# Patient Record
Sex: Female | Born: 1945
Health system: Southern US, Community
[De-identification: ages and names within clinical notes are randomized; demographics above are authoritative.]

## PROBLEM LIST (undated history)

## (undated) DIAGNOSIS — R809 Proteinuria, unspecified: Secondary | ICD-10-CM

## (undated) DIAGNOSIS — G4730 Sleep apnea, unspecified: Secondary | ICD-10-CM

## (undated) DIAGNOSIS — H269 Unspecified cataract: Secondary | ICD-10-CM

## (undated) DIAGNOSIS — Z87898 Personal history of other specified conditions: Secondary | ICD-10-CM

## (undated) DIAGNOSIS — S329XXA Fracture of unspecified parts of lumbosacral spine and pelvis, initial encounter for closed fracture: Secondary | ICD-10-CM

## (undated) DIAGNOSIS — E162 Hypoglycemia, unspecified: Secondary | ICD-10-CM

## (undated) DIAGNOSIS — T8859XA Other complications of anesthesia, initial encounter: Secondary | ICD-10-CM

## (undated) DIAGNOSIS — Z720 Tobacco use: Secondary | ICD-10-CM

## (undated) DIAGNOSIS — M1990 Unspecified osteoarthritis, unspecified site: Secondary | ICD-10-CM

## (undated) DIAGNOSIS — E669 Obesity, unspecified: Secondary | ICD-10-CM

## (undated) DIAGNOSIS — M2550 Pain in unspecified joint: Secondary | ICD-10-CM

## (undated) DIAGNOSIS — R002 Palpitations: Secondary | ICD-10-CM

## (undated) DIAGNOSIS — K219 Gastro-esophageal reflux disease without esophagitis: Secondary | ICD-10-CM

## (undated) DIAGNOSIS — N289 Disorder of kidney and ureter, unspecified: Secondary | ICD-10-CM

## (undated) DIAGNOSIS — F419 Anxiety disorder, unspecified: Secondary | ICD-10-CM

## (undated) DIAGNOSIS — G4700 Insomnia, unspecified: Secondary | ICD-10-CM

## (undated) DIAGNOSIS — F32A Depression, unspecified: Secondary | ICD-10-CM

## (undated) DIAGNOSIS — T7840XA Allergy, unspecified, initial encounter: Secondary | ICD-10-CM

## (undated) DIAGNOSIS — E785 Hyperlipidemia, unspecified: Secondary | ICD-10-CM

## (undated) DIAGNOSIS — T4145XA Adverse effect of unspecified anesthetic, initial encounter: Secondary | ICD-10-CM

## (undated) HISTORY — DX: Depression, unspecified: F32.A

## (undated) HISTORY — PX: SHOULDER SURGERY: SHX246

## (undated) HISTORY — DX: Unspecified cataract: H26.9

## (undated) HISTORY — PX: OTHER SURGICAL HISTORY: SHX169

## (undated) HISTORY — DX: Gastro-esophageal reflux disease without esophagitis: K21.9

## (undated) HISTORY — DX: Tobacco use: Z72.0

## (undated) HISTORY — DX: Obesity, unspecified: E66.9

## (undated) HISTORY — DX: Unspecified osteoarthritis, unspecified site: M19.90

## (undated) HISTORY — DX: Allergy, unspecified, initial encounter: T78.40XA

## (undated) HISTORY — DX: Sleep apnea, unspecified: G47.30

## (undated) HISTORY — DX: Insomnia, unspecified: G47.00

## (undated) HISTORY — DX: Hyperlipidemia, unspecified: E78.5

## (undated) HISTORY — PX: ABDOMINAL HYSTERECTOMY: SHX81

## (undated) HISTORY — DX: Personal history of other specified conditions: Z87.898

## (undated) HISTORY — DX: Pain in unspecified joint: M25.50

## (undated) HISTORY — DX: Fracture of unspecified parts of lumbosacral spine and pelvis, initial encounter for closed fracture: S32.9XXA

## (undated) HISTORY — DX: Disorder of kidney and ureter, unspecified: N28.9

## (undated) HISTORY — PX: FRACTURE SURGERY: SHX138

## (undated) HISTORY — DX: Proteinuria, unspecified: R80.9

## (undated) HISTORY — DX: Palpitations: R00.2

## (undated) HISTORY — PX: TUBAL LIGATION: SHX77

## (undated) HISTORY — PX: APPENDECTOMY: SHX54

## (undated) NOTE — *Deleted (*Deleted)
Health Maintenance Due  Topic Date Due  . COLONOSCOPY  05/11/2020  . INFLUENZA VACCINE  07/04/2020  . MAMMOGRAM  08/24/2020   Depression screen [facility] 2/9 07/31/2020 12/09/2019 02/14/2019  Decreased Interest 0 0 0  Down, Depressed, Hopeless 0 0 0  PHQ - 2 Score 0 0 0  Altered sleeping 1 0 0  Tired, decreased energy 0 0 0  Change in appetite 0 0 0  Feeling bad or failure about yourself  0 0 0  Trouble concentrating 0 0 0  Moving slowly or fidgety/restless 0 0 0  Suicidal thoughts 0 0 0  PHQ-9 Score 1 0 0  Difficult doing work/chores Not difficult at all Not difficult at all -

---

## 1990-12-04 HISTORY — PX: NEPHRECTOMY: SHX65

## 1999-12-05 HISTORY — PX: CHOLECYSTECTOMY: SHX55

## 2000-03-29 ENCOUNTER — Other Ambulatory Visit: Admission: RE | Admit: 2000-03-29 | Discharge: 2000-03-29 | Payer: Self-pay | Admitting: Plastic Surgery

## 2000-12-03 ENCOUNTER — Encounter: Payer: Self-pay | Admitting: *Deleted

## 2000-12-03 ENCOUNTER — Encounter: Admission: RE | Admit: 2000-12-03 | Discharge: 2000-12-03 | Payer: Self-pay | Admitting: *Deleted

## 2000-12-14 ENCOUNTER — Encounter: Payer: Self-pay | Admitting: Family Medicine

## 2000-12-14 ENCOUNTER — Encounter: Admission: RE | Admit: 2000-12-14 | Discharge: 2000-12-14 | Payer: Self-pay | Admitting: Family Medicine

## 2001-06-24 ENCOUNTER — Encounter: Payer: Self-pay | Admitting: Internal Medicine

## 2001-07-08 ENCOUNTER — Encounter (INDEPENDENT_AMBULATORY_CARE_PROVIDER_SITE_OTHER): Payer: Self-pay | Admitting: Specialist

## 2001-07-08 ENCOUNTER — Other Ambulatory Visit: Admission: RE | Admit: 2001-07-08 | Discharge: 2001-07-08 | Payer: Self-pay | Admitting: Surgery

## 2002-09-12 ENCOUNTER — Encounter: Payer: Self-pay | Admitting: Internal Medicine

## 2003-07-09 ENCOUNTER — Observation Stay (HOSPITAL_COMMUNITY): Admission: EM | Admit: 2003-07-09 | Discharge: 2003-07-10 | Payer: Self-pay | Admitting: Emergency Medicine

## 2003-07-10 ENCOUNTER — Encounter: Payer: Self-pay | Admitting: Emergency Medicine

## 2003-07-10 ENCOUNTER — Encounter: Payer: Self-pay | Admitting: Internal Medicine

## 2003-08-26 ENCOUNTER — Encounter: Payer: Self-pay | Admitting: Internal Medicine

## 2003-11-14 ENCOUNTER — Emergency Department (HOSPITAL_COMMUNITY): Admission: AD | Admit: 2003-11-14 | Discharge: 2003-11-14 | Payer: Self-pay | Admitting: Emergency Medicine

## 2004-06-11 ENCOUNTER — Encounter: Admission: RE | Admit: 2004-06-11 | Discharge: 2004-06-11 | Payer: Self-pay | Admitting: Internal Medicine

## 2004-06-11 ENCOUNTER — Encounter: Payer: Self-pay | Admitting: Internal Medicine

## 2004-07-05 ENCOUNTER — Ambulatory Visit (HOSPITAL_COMMUNITY): Admission: RE | Admit: 2004-07-05 | Discharge: 2004-07-05 | Payer: Self-pay | Admitting: Neurosurgery

## 2004-09-08 ENCOUNTER — Encounter: Admission: RE | Admit: 2004-09-08 | Discharge: 2004-09-08 | Payer: Self-pay | Admitting: Obstetrics and Gynecology

## 2005-02-07 ENCOUNTER — Ambulatory Visit: Payer: Self-pay | Admitting: Internal Medicine

## 2005-03-22 ENCOUNTER — Ambulatory Visit: Payer: Self-pay | Admitting: Internal Medicine

## 2005-04-18 ENCOUNTER — Ambulatory Visit: Payer: Self-pay | Admitting: Internal Medicine

## 2005-06-07 ENCOUNTER — Ambulatory Visit: Payer: Self-pay | Admitting: Family Medicine

## 2005-06-11 ENCOUNTER — Emergency Department (HOSPITAL_COMMUNITY): Admission: EM | Admit: 2005-06-11 | Discharge: 2005-06-11 | Payer: Self-pay | Admitting: Emergency Medicine

## 2005-06-16 ENCOUNTER — Ambulatory Visit: Payer: Self-pay | Admitting: Internal Medicine

## 2006-02-09 ENCOUNTER — Ambulatory Visit: Payer: Self-pay | Admitting: Internal Medicine

## 2006-02-12 ENCOUNTER — Ambulatory Visit: Payer: Self-pay | Admitting: Internal Medicine

## 2006-02-19 ENCOUNTER — Ambulatory Visit: Payer: Self-pay | Admitting: Internal Medicine

## 2006-02-28 ENCOUNTER — Other Ambulatory Visit: Admission: RE | Admit: 2006-02-28 | Discharge: 2006-02-28 | Payer: Self-pay | Admitting: Obstetrics and Gynecology

## 2006-04-09 ENCOUNTER — Ambulatory Visit: Payer: Self-pay | Admitting: Internal Medicine

## 2006-04-20 ENCOUNTER — Ambulatory Visit: Payer: Self-pay | Admitting: Internal Medicine

## 2006-05-25 ENCOUNTER — Ambulatory Visit: Payer: Self-pay | Admitting: Internal Medicine

## 2006-05-30 ENCOUNTER — Encounter: Payer: Self-pay | Admitting: Internal Medicine

## 2006-11-21 ENCOUNTER — Encounter: Admission: RE | Admit: 2006-11-21 | Discharge: 2006-11-21 | Payer: Self-pay | Admitting: Internal Medicine

## 2006-11-21 ENCOUNTER — Ambulatory Visit: Payer: Self-pay | Admitting: Internal Medicine

## 2007-04-18 ENCOUNTER — Encounter: Admission: RE | Admit: 2007-04-18 | Discharge: 2007-04-18 | Payer: Self-pay | Admitting: Obstetrics and Gynecology

## 2007-05-05 LAB — CONVERTED CEMR LAB: Pap Smear: NORMAL

## 2007-07-29 ENCOUNTER — Encounter: Payer: Self-pay | Admitting: Internal Medicine

## 2007-12-26 ENCOUNTER — Telehealth: Payer: Self-pay | Admitting: Internal Medicine

## 2007-12-31 ENCOUNTER — Ambulatory Visit: Payer: Self-pay | Admitting: Internal Medicine

## 2007-12-31 LAB — CONVERTED CEMR LAB
ALT: 20 units/L (ref 0–35)
BUN: 13 mg/dL (ref 6–23)
CO2: 30 meq/L (ref 19–32)
Calcium: 9.5 mg/dL (ref 8.4–10.5)
Cholesterol: 128 mg/dL (ref 0–200)
Creatinine, Ser: 1.2 mg/dL (ref 0.4–1.2)
Eosinophils Absolute: 0.2 10*3/uL (ref 0.0–0.6)
Eosinophils Relative: 2.7 % (ref 0.0–5.0)
GFR calc non Af Amer: 49 mL/min
Glucose, Bld: 88 mg/dL (ref 70–99)
HCT: 41.2 % (ref 36.0–46.0)
LDL Cholesterol: 72 mg/dL (ref 0–99)
Lymphocytes Relative: 26.7 % (ref 12.0–46.0)
MCV: 93.7 fL (ref 78.0–100.0)
Monocytes Absolute: 0.7 10*3/uL (ref 0.2–0.7)
Neutrophils Relative %: 60.3 % (ref 43.0–77.0)
Platelets: 188 10*3/uL (ref 150–400)
RBC: 4.39 M/uL (ref 3.87–5.11)
RDW: 12.3 % (ref 11.5–14.6)
TSH: 1.89 microintl units/mL (ref 0.35–5.50)
Total Bilirubin: 0.6 mg/dL (ref 0.3–1.2)
Total Protein: 6 g/dL (ref 6.0–8.3)
Triglycerides: 79 mg/dL (ref 0–149)
WBC: 7.3 10*3/uL (ref 4.5–10.5)

## 2008-01-01 ENCOUNTER — Encounter: Payer: Self-pay | Admitting: Internal Medicine

## 2008-01-08 ENCOUNTER — Ambulatory Visit: Payer: Self-pay | Admitting: Internal Medicine

## 2008-01-08 ENCOUNTER — Encounter: Payer: Self-pay | Admitting: Internal Medicine

## 2008-01-08 DIAGNOSIS — N390 Urinary tract infection, site not specified: Secondary | ICD-10-CM | POA: Insufficient documentation

## 2008-01-08 LAB — CONVERTED CEMR LAB
Bilirubin Urine: NEGATIVE
Specific Gravity, Urine: <1.005
Urobilinogen, UA: 0.2
WBC Urine, dipstick: NEGATIVE
pH: 5.5

## 2008-01-09 ENCOUNTER — Encounter: Payer: Self-pay | Admitting: Internal Medicine

## 2008-01-23 DIAGNOSIS — R809 Proteinuria, unspecified: Secondary | ICD-10-CM | POA: Insufficient documentation

## 2008-01-23 DIAGNOSIS — K8020 Calculus of gallbladder without cholecystitis without obstruction: Secondary | ICD-10-CM | POA: Insufficient documentation

## 2008-01-23 DIAGNOSIS — K219 Gastro-esophageal reflux disease without esophagitis: Secondary | ICD-10-CM | POA: Insufficient documentation

## 2008-01-23 DIAGNOSIS — R3129 Other microscopic hematuria: Secondary | ICD-10-CM | POA: Insufficient documentation

## 2008-01-24 ENCOUNTER — Encounter: Payer: Self-pay | Admitting: Internal Medicine

## 2008-02-24 ENCOUNTER — Telehealth: Payer: Self-pay | Admitting: Internal Medicine

## 2008-07-02 ENCOUNTER — Telehealth: Payer: Self-pay | Admitting: Internal Medicine

## 2008-10-14 ENCOUNTER — Telehealth: Payer: Self-pay | Admitting: Internal Medicine

## 2008-10-14 ENCOUNTER — Ambulatory Visit: Payer: Self-pay | Admitting: Family Medicine

## 2008-10-14 DIAGNOSIS — H6690 Otitis media, unspecified, unspecified ear: Secondary | ICD-10-CM | POA: Insufficient documentation

## 2008-10-14 DIAGNOSIS — G4700 Insomnia, unspecified: Secondary | ICD-10-CM | POA: Insufficient documentation

## 2008-10-14 DIAGNOSIS — M129 Arthropathy, unspecified: Secondary | ICD-10-CM | POA: Insufficient documentation

## 2008-10-14 DIAGNOSIS — T781XXA Other adverse food reactions, not elsewhere classified, initial encounter: Secondary | ICD-10-CM | POA: Insufficient documentation

## 2008-10-14 HISTORY — DX: Insomnia, unspecified: G47.00

## 2008-11-25 ENCOUNTER — Ambulatory Visit: Payer: Self-pay | Admitting: Internal Medicine

## 2008-11-25 DIAGNOSIS — H6980 Other specified disorders of Eustachian tube, unspecified ear: Secondary | ICD-10-CM | POA: Insufficient documentation

## 2009-01-20 DIAGNOSIS — H9190 Unspecified hearing loss, unspecified ear: Secondary | ICD-10-CM | POA: Insufficient documentation

## 2009-01-20 DIAGNOSIS — K1120 Sialoadenitis, unspecified: Secondary | ICD-10-CM | POA: Insufficient documentation

## 2009-01-20 DIAGNOSIS — M5137 Other intervertebral disc degeneration, lumbosacral region: Secondary | ICD-10-CM | POA: Insufficient documentation

## 2009-01-20 DIAGNOSIS — E669 Obesity, unspecified: Secondary | ICD-10-CM | POA: Insufficient documentation

## 2009-04-27 DIAGNOSIS — G4700 Insomnia, unspecified: Secondary | ICD-10-CM | POA: Insufficient documentation

## 2009-05-26 DIAGNOSIS — L259 Unspecified contact dermatitis, unspecified cause: Secondary | ICD-10-CM | POA: Insufficient documentation

## 2009-05-26 DIAGNOSIS — R319 Hematuria, unspecified: Secondary | ICD-10-CM | POA: Insufficient documentation

## 2009-05-26 DIAGNOSIS — M129 Arthropathy, unspecified: Secondary | ICD-10-CM | POA: Insufficient documentation

## 2009-05-26 DIAGNOSIS — D126 Benign neoplasm of colon, unspecified: Secondary | ICD-10-CM | POA: Insufficient documentation

## 2009-05-28 DIAGNOSIS — E559 Vitamin D deficiency, unspecified: Secondary | ICD-10-CM | POA: Insufficient documentation

## 2009-12-09 DIAGNOSIS — F411 Generalized anxiety disorder: Secondary | ICD-10-CM | POA: Insufficient documentation

## 2009-12-09 DIAGNOSIS — E785 Hyperlipidemia, unspecified: Secondary | ICD-10-CM | POA: Insufficient documentation

## 2010-04-16 ENCOUNTER — Encounter (INDEPENDENT_AMBULATORY_CARE_PROVIDER_SITE_OTHER): Payer: Self-pay | Admitting: Emergency Medicine

## 2010-04-16 ENCOUNTER — Observation Stay (HOSPITAL_COMMUNITY): Admission: EM | Admit: 2010-04-16 | Discharge: 2010-04-16 | Payer: Self-pay | Admitting: Emergency Medicine

## 2010-12-25 ENCOUNTER — Encounter: Payer: Self-pay | Admitting: Obstetrics and Gynecology

## 2010-12-25 ENCOUNTER — Encounter: Payer: Self-pay | Admitting: Neurosurgery

## 2011-02-21 LAB — URINALYSIS, ROUTINE W REFLEX MICROSCOPIC
Glucose, UA: NEGATIVE mg/dL
Ketones, ur: NEGATIVE mg/dL
Protein, ur: NEGATIVE mg/dL

## 2011-02-21 LAB — BRAIN NATRIURETIC PEPTIDE: Pro B Natriuretic peptide (BNP): 75 pg/mL (ref 0.0–100.0)

## 2011-02-21 LAB — BASIC METABOLIC PANEL
BUN: 19 mg/dL (ref 6–23)
CO2: 27 mEq/L (ref 19–32)
GFR calc Af Amer: 51 mL/min — ABNORMAL LOW (ref >60)
Glucose, Bld: 106 mg/dL — ABNORMAL HIGH (ref 70–99)

## 2011-02-21 LAB — CBC
HCT: 36.9 % (ref 36.0–46.0)
Hemoglobin: 12.6 g/dL (ref 12.0–15.0)
MCHC: 34.2 g/dL (ref 30.0–36.0)
MCV: 91 fL (ref 78.0–100.0)
Platelets: 209 10*3/uL (ref 150–400)
RDW: 13.6 % (ref 11.5–15.5)

## 2011-02-21 LAB — HEPATIC FUNCTION PANEL
ALT: 15 U/L (ref 0–35)
AST: 18 U/L (ref 0–37)
Bilirubin, Direct: <0.1 mg/dL (ref 0.0–0.3)
Total Bilirubin: 0.5 mg/dL (ref 0.3–1.2)
Total Protein: 6 g/dL (ref 6.0–8.3)

## 2011-02-21 LAB — PROTIME-INR: INR: 1.01 (ref 0.00–1.49)

## 2011-02-21 LAB — HEMOGLOBIN A1C
Hgb A1c MFr Bld: 5.9 % — ABNORMAL HIGH (ref <5.7)
Mean Plasma Glucose: 123 mg/dL — ABNORMAL HIGH (ref <117)

## 2011-02-21 LAB — TROPONIN I: Troponin I: 0.01 ng/mL (ref 0.00–0.06)

## 2011-02-21 LAB — CK TOTAL AND CKMB (NOT AT ARMC)
Relative Index: INVALID (ref 0.0–2.5)
Total CK: 48 U/L (ref 7–177)

## 2011-02-21 LAB — LIPID PANEL
Cholesterol: 172 mg/dL (ref 0–200)
Total CHOL/HDL Ratio: 3 RATIO

## 2011-02-21 LAB — DIFFERENTIAL
Basophils Relative: 1 % (ref 0–1)
Monocytes Absolute: 0.9 10*3/uL (ref 0.1–1.0)

## 2011-02-21 LAB — URINE MICROSCOPIC-ADD ON

## 2011-02-21 LAB — POCT CARDIAC MARKERS
CKMB, poc: <1 ng/mL — ABNORMAL LOW (ref 1.0–8.0)
Myoglobin, poc: 64 ng/mL (ref 12–200)
Myoglobin, poc: 65.4 ng/mL (ref 12–200)

## 2011-04-21 NOTE — Discharge Summary (Signed)
NAME:  Angie Velasquez, Angie Velasquez                    ACCOUNT NO.:  [id number]   MEDICAL RECORD NO.:  [id number]                   PATIENT TYPE:  INP   LOCATION:  4727                                 FACILITY:  [facility]   PHYSICIAN:  Rene Paci, M.D. [facility]          DATE OF BIRTH:  1946-05-25   DATE OF ADMISSION:  07/09/2003  DATE OF DISCHARGE:  07/10/2003                                 DISCHARGE SUMMARY   DISCHARGE DIAGNOSES:  1. Chest pain.  2. Chest wall inflammation.   BRIEF ADMISSION HISTORY:  Ms. Angie Velasquez is a 65 year old white female who  awoke at 4 a.m., the morning of admission, with substernal chest pain  radiating to the left chest and left upper extremity.  She had no associated  dyspnea, nausea, or diaphoresis.  She denies prior history of chest pain.  She denies any decreased exercise tolerance.  She denies any dyspnea on  exertion.  Reportedly, she has a history of a negative treadmill stress test  3 months ago.   Cardiac risk factors are negative for diabetes, hypertension.  Positive for  hypercholesterolemia and tobacco and negative for family history.   PAST MEDICAL HISTORY:  1. Remote history of gastroesophageal reflux disease.  2. History of pneumonia with lobar scarring.  3. Status post right nephrectomy in 1992 secondary to a complex cystic mass     that was benign.  4. Status post cholecystectomy.  5. Status post removal of a benign mass under her left upper extremity.  6. Status post hysterectomy secondary to endometrial thickening.  7. 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  8. History of viral meningitis greater than 20 years ago.   HOSPITAL COURSE:  Atypical chest pain:  Her symptoms were more consistent  with an inflammatory chest wall pain as she was extremely tender to touch.  We did admit her to rule out cardiac ischemia.  Her initial cardiac enzymes  were negative, as was her EKG.  There was no  evidence of pulmonary embolus  or reflux disease.  Patient serial cardiac enzymes were negative, and again  her second EKG did not show any evidence of ischemia.  We did schedule the  patient for an outpatient adenosine Cardiolite for July 10, 2003.  We felt  this was most likely an inflammatory chest wall pain and she received 3  doses of Solu-Medrol here, and we will have her continue her Bextra after  discharge.  Her pain is improved.   LABS AT DISCHARGE:  Fasting lipid profile is still pending, otherwise, labs  are normal.   MEDICATIONS AT DISCHARGE:  1. Wellbutrin as at home.  2. Bextra 10 mg q.d.  3. Estratest 0.125 mg q.d.   Followup for adenosine Cardiolite July 10, 2003, at 11:45 a.m.  Followup  with Dr. Cato Velasquez on Wednesday, July 15, 2003, at 9:00 a.m.  Angie Velasquez, P.A. [facility]                  Rene Paci, M.D. [facility]    LC/MEDQ  D:  07/10/2003  T:  07/10/2003  Job:  [id number]   cc:   Angie Velasquez, M.D. [facility]

## 2011-05-24 IMAGING — CR DG CHEST 2V
2 series · 2 of 2 positions shown · non-contrast
Comparison: 11/21/2006

CLINICAL DATA: Chest pain.  Blurry vision.  Jaw pain.  Headache.
Smoker.

CHEST - 2 VIEW

[w chest pa]
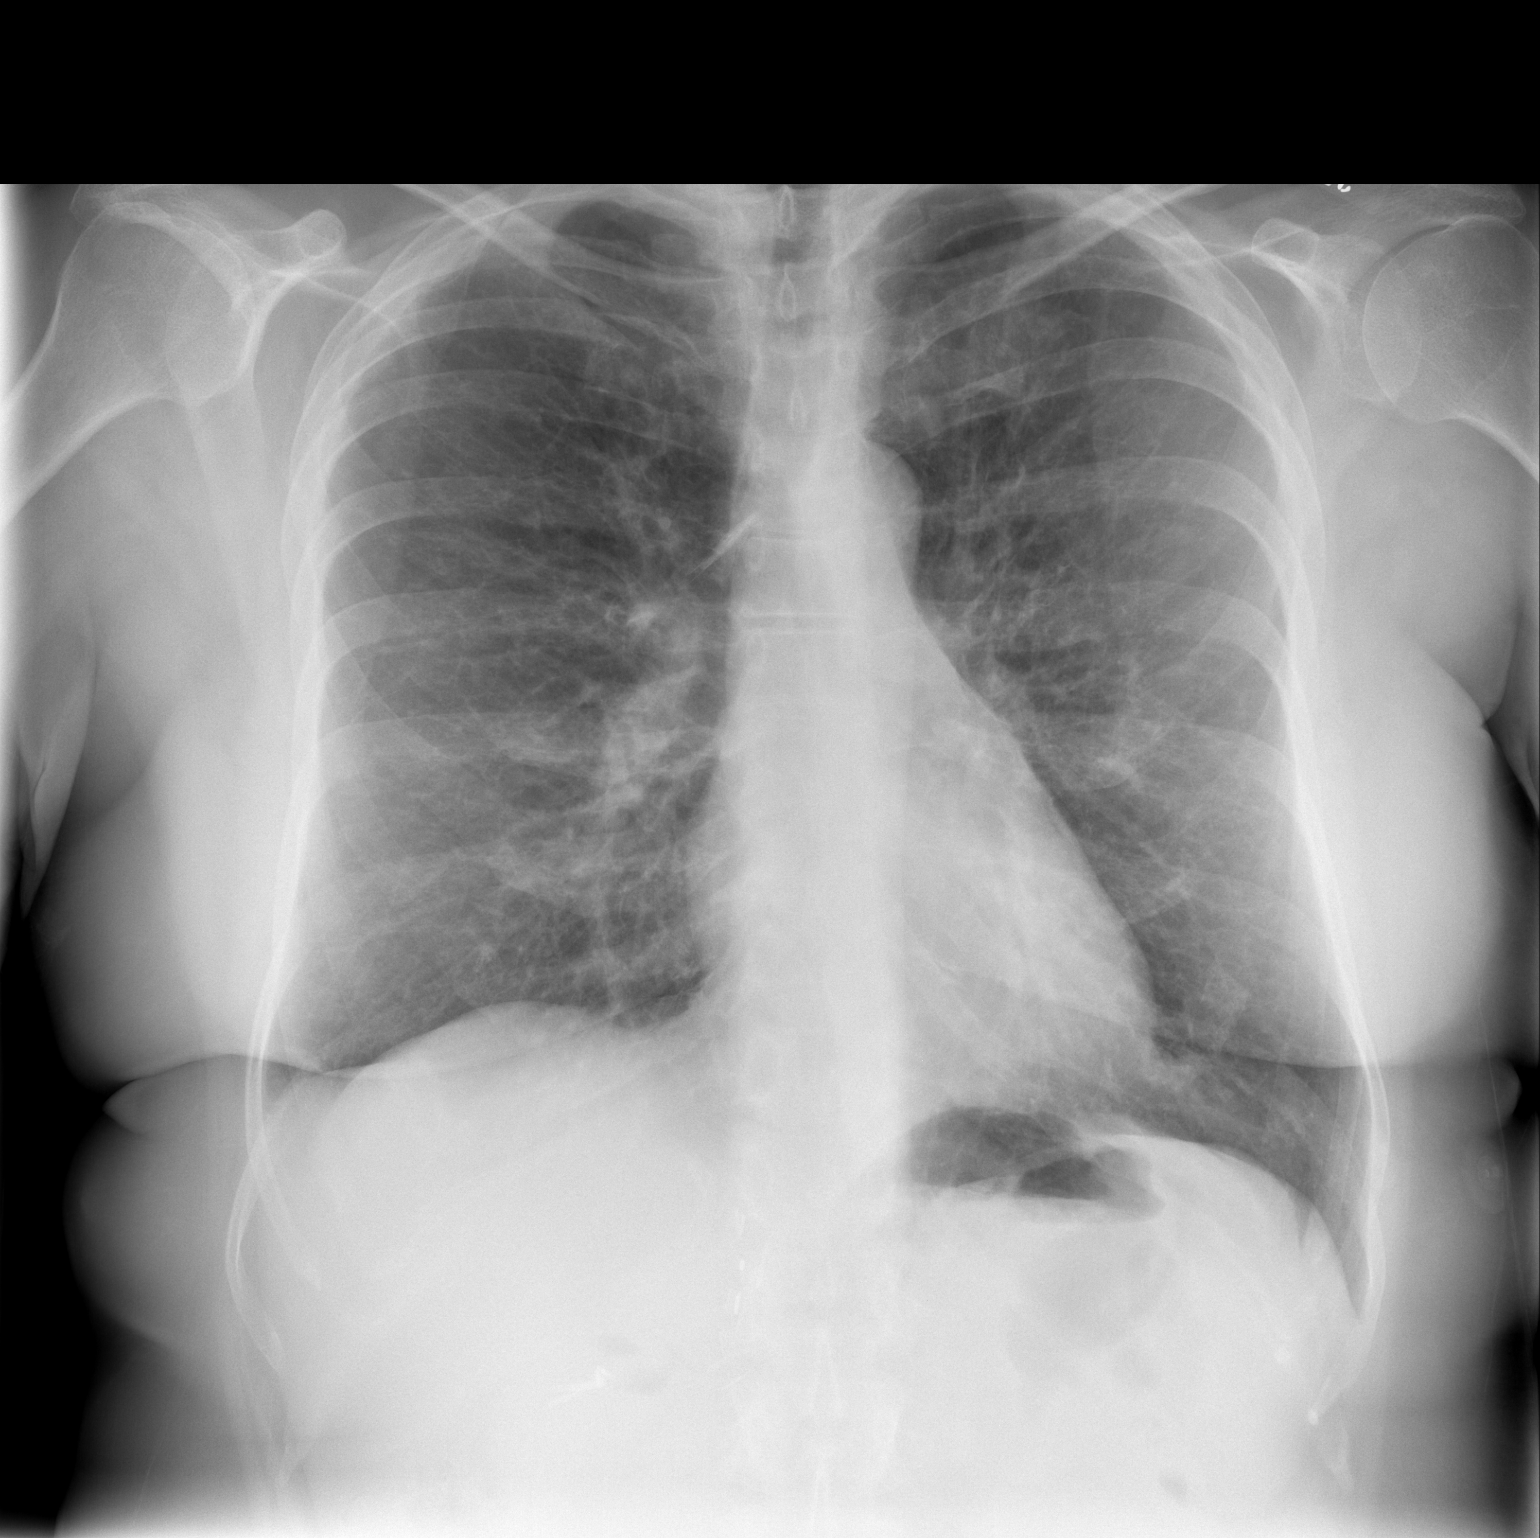

[w chest lat]
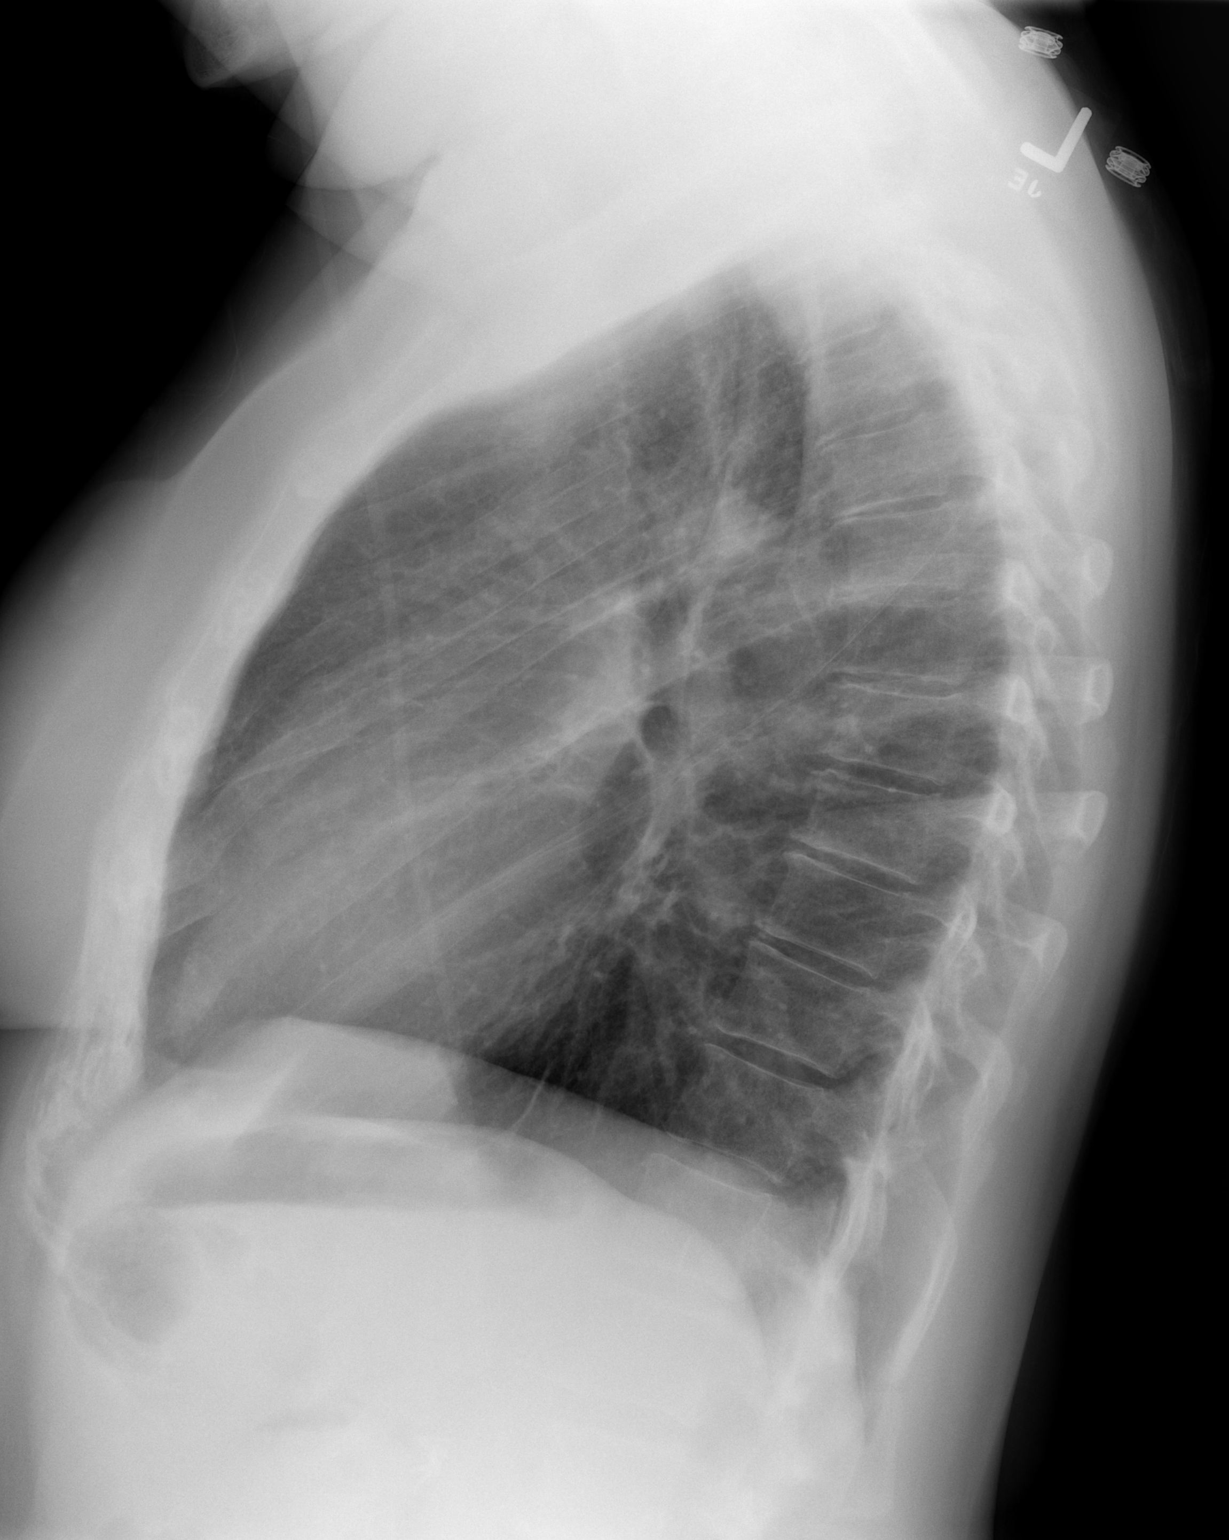

[2 of 2 positions shown; findings below may reference images not displayed]

FINDINGS: Hyperinflation suggests COPD.

A linear radiopaque density projects over the right side of the
mediastinum.  Favored to be artifactual / external to the patient.
Midline trachea.  Normal heart size and mediastinal contours. No
pleural effusion or pneumothorax.  Diffuse peribronchial
thickening.  Minimal scar at the right lung base again identified.
IMPRESSION: 1.  COPD/chronic bronchitis without acute superimposed process.
2.  Linear density projecting over the right side of the
mediastinum on the frontal view.  Question artifactual /
superficial to the patient.  Clinically correlate.

## 2012-04-10 ENCOUNTER — Encounter: Payer: Self-pay | Admitting: *Deleted

## 2012-04-10 DIAGNOSIS — R002 Palpitations: Secondary | ICD-10-CM | POA: Insufficient documentation

## 2012-04-10 DIAGNOSIS — M1990 Unspecified osteoarthritis, unspecified site: Secondary | ICD-10-CM | POA: Insufficient documentation

## 2012-04-10 DIAGNOSIS — Z87891 Personal history of nicotine dependence: Secondary | ICD-10-CM | POA: Insufficient documentation

## 2012-04-10 DIAGNOSIS — Z87898 Personal history of other specified conditions: Secondary | ICD-10-CM | POA: Insufficient documentation

## 2012-04-10 DIAGNOSIS — K219 Gastro-esophageal reflux disease without esophagitis: Secondary | ICD-10-CM | POA: Insufficient documentation

## 2013-12-04 HISTORY — PX: EYE SURGERY: SHX253

## 2014-06-03 ENCOUNTER — Ambulatory Visit: Payer: Self-pay | Admitting: Podiatry

## 2014-06-29 ENCOUNTER — Encounter (HOSPITAL_COMMUNITY): Payer: Self-pay | Admitting: Pharmacy Technician

## 2014-07-01 ENCOUNTER — Encounter (HOSPITAL_COMMUNITY): Payer: Self-pay | Admitting: *Deleted

## 2014-07-01 ENCOUNTER — Other Ambulatory Visit: Payer: Self-pay | Admitting: Orthopedic Surgery

## 2014-07-01 NOTE — Progress Notes (Signed)
No pre-op orders in EPIC. Called Dr. Nona Dell office and left message requesting orders.

## 2014-07-01 NOTE — Progress Notes (Signed)
Pt's PCP is Dr. Virl Axe at [facility] in [place] [phone])

## 2014-07-02 ENCOUNTER — Ambulatory Visit (HOSPITAL_COMMUNITY): Payer: Medicare HMO | Admitting: Certified Registered Nurse Anesthetist

## 2014-07-02 ENCOUNTER — Inpatient Hospital Stay (HOSPITAL_COMMUNITY)
Admission: RE | Admit: 2014-07-02 | Discharge: 2014-07-03 | DRG: 494 | Disposition: A | Discharge status: Home / Self Care | Payer: Medicare HMO | Source: Ambulatory Visit | Attending: Orthopedic Surgery | Admitting: Orthopedic Surgery

## 2014-07-02 ENCOUNTER — Encounter (HOSPITAL_COMMUNITY): Payer: Self-pay | Admitting: Certified Registered Nurse Anesthetist

## 2014-07-02 ENCOUNTER — Encounter (HOSPITAL_COMMUNITY)
Admission: RE | Disposition: A | Discharge status: Home / Self Care | Payer: Self-pay | Source: Ambulatory Visit | Attending: Orthopedic Surgery

## 2014-07-02 ENCOUNTER — Other Ambulatory Visit: Payer: Self-pay | Admitting: Physician Assistant

## 2014-07-02 ENCOUNTER — Encounter (HOSPITAL_COMMUNITY): Payer: Medicare HMO | Admitting: Certified Registered Nurse Anesthetist

## 2014-07-02 DIAGNOSIS — X500XXA Overexertion from strenuous movement or load, initial encounter: Secondary | ICD-10-CM | POA: Diagnosis present

## 2014-07-02 DIAGNOSIS — Z905 Acquired absence of kidney: Secondary | ICD-10-CM | POA: Diagnosis not present

## 2014-07-02 DIAGNOSIS — K219 Gastro-esophageal reflux disease without esophagitis: Secondary | ICD-10-CM | POA: Diagnosis present

## 2014-07-02 DIAGNOSIS — F411 Generalized anxiety disorder: Secondary | ICD-10-CM | POA: Diagnosis present

## 2014-07-02 DIAGNOSIS — Z90710 Acquired absence of both cervix and uterus: Secondary | ICD-10-CM

## 2014-07-02 DIAGNOSIS — S82843A Displaced bimalleolar fracture of unspecified lower leg, initial encounter for closed fracture: Secondary | ICD-10-CM | POA: Diagnosis present

## 2014-07-02 DIAGNOSIS — Y92009 Unspecified place in unspecified non-institutional (private) residence as the place of occurrence of the external cause: Secondary | ICD-10-CM

## 2014-07-02 DIAGNOSIS — Z87891 Personal history of nicotine dependence: Secondary | ICD-10-CM

## 2014-07-02 DIAGNOSIS — M129 Arthropathy, unspecified: Secondary | ICD-10-CM | POA: Diagnosis present

## 2014-07-02 DIAGNOSIS — M109 Gout, unspecified: Secondary | ICD-10-CM | POA: Diagnosis present

## 2014-07-02 DIAGNOSIS — E785 Hyperlipidemia, unspecified: Secondary | ICD-10-CM | POA: Diagnosis present

## 2014-07-02 DIAGNOSIS — S82841D Displaced bimalleolar fracture of right lower leg, subsequent encounter for closed fracture with routine healing: Secondary | ICD-10-CM

## 2014-07-02 HISTORY — DX: Hypoglycemia, unspecified: E16.2

## 2014-07-02 HISTORY — PX: ORIF ANKLE FRACTURE: SHX5408

## 2014-07-02 HISTORY — DX: Anxiety disorder, unspecified: F41.9

## 2014-07-02 HISTORY — DX: Other complications of anesthesia, initial encounter: T88.59XA

## 2014-07-02 HISTORY — DX: Adverse effect of unspecified anesthetic, initial encounter: T41.45XA

## 2014-07-02 LAB — BASIC METABOLIC PANEL
Anion gap: 16 — ABNORMAL HIGH (ref 5–15)
BUN: 25 mg/dL — ABNORMAL HIGH (ref 6–23)
CHLORIDE: 102 meq/L (ref 96–112)
CO2: 23 mEq/L (ref 19–32)
Calcium: 9.3 mg/dL (ref 8.4–10.5)
Creatinine, Ser: 1.07 mg/dL (ref 0.50–1.10)
GFR calc non Af Amer: 52 mL/min — ABNORMAL LOW (ref >90)
GFR, EST AFRICAN AMERICAN: 61 mL/min — AB (ref >90)
GLUCOSE: 99 mg/dL (ref 70–99)
POTASSIUM: 4.6 meq/L (ref 3.7–5.3)
Sodium: 141 mEq/L (ref 137–147)

## 2014-07-02 LAB — CBC
HEMATOCRIT: 38.3 % (ref 36.0–46.0)
HEMOGLOBIN: 12.6 g/dL (ref 12.0–15.0)
MCH: 29.4 pg (ref 26.0–34.0)
MCHC: 32.9 g/dL (ref 30.0–36.0)
MCV: 89.3 fL (ref 78.0–100.0)
Platelets: 230 10*3/uL (ref 150–400)
RBC: 4.29 MIL/uL (ref 3.87–5.11)
RDW: 14.5 % (ref 11.5–15.5)
WBC: 9.6 10*3/uL (ref 4.0–10.5)

## 2014-07-02 LAB — GLUCOSE, CAPILLARY: GLUCOSE-CAPILLARY: 103 mg/dL — AB (ref 70–99)

## 2014-07-02 SURGERY — OPEN REDUCTION INTERNAL FIXATION (ORIF) ANKLE FRACTURE
Anesthesia: Regional | Site: Ankle | Laterality: Right

## 2014-07-02 MED ORDER — SODIUM CHLORIDE 0.9 % IV SOLN: INTRAVENOUS | Status: DC

## 2014-07-02 MED ORDER — PROPOFOL 10 MG/ML IV BOLUS
INTRAVENOUS | Status: AC
  Filled 2014-07-02: qty 20

## 2014-07-02 MED ORDER — LIDOCAINE HCL 4 % MT SOLN
OROMUCOSAL | Status: DC | PRN
  Administered 2014-07-02: 4 mL via TOPICAL

## 2014-07-02 MED ORDER — FENTANYL CITRATE 0.05 MG/ML IJ SOLN
INTRAMUSCULAR | Status: AC
  Filled 2014-07-02: qty 5

## 2014-07-02 MED ORDER — PROPOFOL 10 MG/ML IV BOLUS
INTRAVENOUS | Status: DC | PRN
  Administered 2014-07-02: 150 mg via INTRAVENOUS

## 2014-07-02 MED ORDER — FENTANYL CITRATE 0.05 MG/ML IJ SOLN
INTRAMUSCULAR | Status: AC
  Filled 2014-07-02: qty 2

## 2014-07-02 MED ORDER — ACETAMINOPHEN 500 MG PO TABS
1000.0000 mg | ORAL_TABLET | Freq: Once | ORAL | Status: AC
  Administered 2014-07-02: 1000 mg via ORAL
  Filled 2014-07-02: qty 2

## 2014-07-02 MED ORDER — LIDOCAINE HCL (CARDIAC) 20 MG/ML IV SOLN
INTRAVENOUS | Status: AC
  Filled 2014-07-02: qty 5

## 2014-07-02 MED ORDER — DEXAMETHASONE SODIUM PHOSPHATE 4 MG/ML IJ SOLN
INTRAMUSCULAR | Status: DC | PRN
  Administered 2014-07-02: 4 mg via INTRAVENOUS

## 2014-07-02 MED ORDER — OXYCODONE HCL 5 MG PO TABS
5.0000 mg | ORAL_TABLET | ORAL | Status: DC | PRN
  Administered 2014-07-03 (×3): 10 mg via ORAL
  Filled 2014-07-02 (×3): qty 2

## 2014-07-02 MED ORDER — LIDOCAINE HCL (CARDIAC) 20 MG/ML IV SOLN
INTRAVENOUS | Status: DC | PRN
  Administered 2014-07-02: 80 mg via INTRAVENOUS

## 2014-07-02 MED ORDER — BACITRACIN ZINC 500 UNIT/GM EX OINT
TOPICAL_OINTMENT | CUTANEOUS | Status: DC | PRN
  Administered 2014-07-02: 1 via TOPICAL

## 2014-07-02 MED ORDER — METOCLOPRAMIDE HCL 5 MG/ML IJ SOLN: 5.0000 mg | Freq: Three times a day (TID) | INTRAMUSCULAR | Status: DC | PRN

## 2014-07-02 MED ORDER — MIDAZOLAM HCL 5 MG/5ML IJ SOLN
INTRAMUSCULAR | Status: DC | PRN
  Administered 2014-07-02: 2 mg via INTRAVENOUS

## 2014-07-02 MED ORDER — ONDANSETRON HCL 4 MG/2ML IJ SOLN
INTRAMUSCULAR | Status: AC
  Filled 2014-07-02: qty 2

## 2014-07-02 MED ORDER — FENTANYL CITRATE 0.05 MG/ML IJ SOLN
INTRAMUSCULAR | Status: DC | PRN
  Administered 2014-07-02 (×2): 100 ug via INTRAVENOUS

## 2014-07-02 MED ORDER — CHLORHEXIDINE GLUCONATE 4 % EX LIQD
60.0000 mL | Freq: Once | CUTANEOUS | Status: DC
  Filled 2014-07-02: qty 60

## 2014-07-02 MED ORDER — SENNA 8.6 MG PO TABS
1.0000 | ORAL_TABLET | Freq: Two times a day (BID) | ORAL | Status: DC
  Administered 2014-07-02 – 2014-07-03 (×2): 8.6 mg via ORAL
  Filled 2014-07-02 (×3): qty 1

## 2014-07-02 MED ORDER — DEXAMETHASONE SODIUM PHOSPHATE 4 MG/ML IJ SOLN
INTRAMUSCULAR | Status: AC
  Filled 2014-07-02: qty 1

## 2014-07-02 MED ORDER — METOCLOPRAMIDE HCL 10 MG PO TABS: 5.0000 mg | ORAL_TABLET | Freq: Three times a day (TID) | ORAL | Status: DC | PRN

## 2014-07-02 MED ORDER — CLINDAMYCIN PHOSPHATE 900 MG/50ML IV SOLN
900.0000 mg | INTRAVENOUS | Status: AC
Start: 2014-07-02 — End: 2014-07-02
  Administered 2014-07-02: 900 mg via INTRAVENOUS
  Filled 2014-07-02: qty 50

## 2014-07-02 MED ORDER — SODIUM CHLORIDE 0.9 % IV SOLN
INTRAVENOUS | Status: DC
  Administered 2014-07-02: 21:00:00 via INTRAVENOUS

## 2014-07-02 MED ORDER — MIDAZOLAM HCL 2 MG/2ML IJ SOLN
INTRAMUSCULAR | Status: AC
  Filled 2014-07-02: qty 2

## 2014-07-02 MED ORDER — LACTATED RINGERS IV SOLN
INTRAVENOUS | Status: DC
  Administered 2014-07-02: 12:00:00 via INTRAVENOUS

## 2014-07-02 MED ORDER — METOCLOPRAMIDE HCL 5 MG/ML IJ SOLN
INTRAMUSCULAR | Status: DC | PRN
  Administered 2014-07-02: 10 mg via INTRAVENOUS

## 2014-07-02 MED ORDER — SUCCINYLCHOLINE CHLORIDE 20 MG/ML IJ SOLN
INTRAMUSCULAR | Status: DC | PRN
Start: 2014-07-02 — End: 2014-07-02
  Administered 2014-07-02: 100 mg via INTRAVENOUS

## 2014-07-02 MED ORDER — ONDANSETRON HCL 4 MG/2ML IJ SOLN: 4.0000 mg | Freq: Four times a day (QID) | INTRAMUSCULAR | Status: DC | PRN

## 2014-07-02 MED ORDER — ONDANSETRON HCL 4 MG/2ML IJ SOLN
INTRAMUSCULAR | Status: DC | PRN
  Administered 2014-07-02: 4 mg via INTRAVENOUS

## 2014-07-02 MED ORDER — BACITRACIN ZINC 500 UNIT/GM EX OINT
TOPICAL_OINTMENT | CUTANEOUS | Status: AC
  Filled 2014-07-02: qty 15

## 2014-07-02 MED ORDER — PHENYLEPHRINE HCL 10 MG/ML IJ SOLN
INTRAMUSCULAR | Status: DC | PRN
  Administered 2014-07-02 (×2): 80 ug via INTRAVENOUS

## 2014-07-02 MED ORDER — BUPIVACAINE-EPINEPHRINE (PF) 0.5% -1:200000 IJ SOLN
INTRAMUSCULAR | Status: DC | PRN
  Administered 2014-07-02 (×2): 20 mL via PERINEURAL

## 2014-07-02 MED ORDER — HYDROMORPHONE HCL PF 1 MG/ML IJ SOLN: 0.5000 mg | INTRAMUSCULAR | Status: DC | PRN

## 2014-07-02 MED ORDER — LACTATED RINGERS IV SOLN
INTRAVENOUS | Status: DC | PRN
  Administered 2014-07-02 (×2): via INTRAVENOUS

## 2014-07-02 MED ORDER — OXYCODONE HCL 5 MG PO TABS: 5.0000 mg | ORAL_TABLET | Freq: Once | ORAL | Status: DC | PRN

## 2014-07-02 MED ORDER — DOCUSATE SODIUM 100 MG PO CAPS
100.0000 mg | ORAL_CAPSULE | Freq: Two times a day (BID) | ORAL | Status: DC
  Administered 2014-07-02 – 2014-07-03 (×2): 100 mg via ORAL
  Filled 2014-07-02 (×2): qty 1

## 2014-07-02 MED ORDER — PHENYLEPHRINE 40 MCG/ML (10ML) SYRINGE FOR IV PUSH (FOR BLOOD PRESSURE SUPPORT)
PREFILLED_SYRINGE | INTRAVENOUS | Status: AC
Start: 2014-07-02 — End: 2014-07-02
  Filled 2014-07-02: qty 10

## 2014-07-02 MED ORDER — OXYCODONE HCL 5 MG/5ML PO SOLN: 5.0000 mg | Freq: Once | ORAL | Status: DC | PRN

## 2014-07-02 MED ORDER — ENOXAPARIN SODIUM 40 MG/0.4ML ~~LOC~~ SOLN
40.0000 mg | SUBCUTANEOUS | Status: DC
  Administered 2014-07-03: 40 mg via SUBCUTANEOUS
  Filled 2014-07-02 (×2): qty 0.4

## 2014-07-02 MED ORDER — HYDROMORPHONE HCL PF 1 MG/ML IJ SOLN: 0.2500 mg | INTRAMUSCULAR | Status: DC | PRN

## 2014-07-02 MED ORDER — METOCLOPRAMIDE HCL 5 MG/ML IJ SOLN
INTRAMUSCULAR | Status: AC
  Filled 2014-07-02: qty 2

## 2014-07-02 MED ORDER — 0.9 % SODIUM CHLORIDE (POUR BTL) OPTIME
TOPICAL | Status: DC | PRN
  Administered 2014-07-02: 1000 mL

## 2014-07-02 SURGICAL SUPPLY — 69 items
BANDAGE ELASTIC 4 VELCRO ST LF (GAUZE/BANDAGES/DRESSINGS) ×1 IMPLANT
BANDAGE ESMARK 6X9 LF (GAUZE/BANDAGES/DRESSINGS) ×1 IMPLANT
BIT DRILL 2.5X2.75 QC CALB (BIT) ×1 IMPLANT
BIT DRILL 3.5X5.5 QC CALB (BIT) ×1 IMPLANT
BLADE SURG 15 STRL LF DISP TIS (BLADE) ×1 IMPLANT
BLADE SURG 15 STRL SS (BLADE) ×2
BNDG CMPR 9X6 STRL LF SNTH (GAUZE/BANDAGES/DRESSINGS) ×1
BNDG COHESIVE 4X5 TAN STRL (GAUZE/BANDAGES/DRESSINGS) ×2 IMPLANT
BNDG COHESIVE 6X5 TAN STRL LF (GAUZE/BANDAGES/DRESSINGS) ×2 IMPLANT
BNDG ESMARK 6X9 LF (GAUZE/BANDAGES/DRESSINGS) ×2
CANISTER SUCT 3000ML (MISCELLANEOUS) ×2 IMPLANT
CHLORAPREP W/TINT 26ML (MISCELLANEOUS) ×2 IMPLANT
COVER SURGICAL LIGHT HANDLE (MISCELLANEOUS) ×2 IMPLANT
CUFF TOURNIQUET SINGLE 34IN LL (TOURNIQUET CUFF) ×2 IMPLANT
CUFF TOURNIQUET SINGLE 44IN (TOURNIQUET CUFF) IMPLANT
DRAPE C-ARM 42X72 X-RAY (DRAPES) ×2 IMPLANT
DRAPE C-ARMOR (DRAPES) ×2 IMPLANT
DRAPE OEC MINIVIEW 54X84 (DRAPES) ×2 IMPLANT
DRAPE U-SHAPE 47X51 STRL (DRAPES) ×2 IMPLANT
DRSG ADAPTIC 3X8 NADH LF (GAUZE/BANDAGES/DRESSINGS) IMPLANT
DRSG PAD ABDOMINAL 8X10 ST (GAUZE/BANDAGES/DRESSINGS) ×4 IMPLANT
ELECT REM PT RETURN 9FT ADLT (ELECTROSURGICAL) ×2
ELECTRODE REM PT RTRN 9FT ADLT (ELECTROSURGICAL) ×1 IMPLANT
GLOVE BIO SURGEON STRL SZ7 (GLOVE) ×4 IMPLANT
GLOVE BIO SURGEON STRL SZ8 (GLOVE) ×2 IMPLANT
GLOVE BIOGEL PI IND STRL 7.0 (GLOVE) IMPLANT
GLOVE BIOGEL PI IND STRL 7.5 (GLOVE) ×1 IMPLANT
GLOVE BIOGEL PI IND STRL 8 (GLOVE) ×1 IMPLANT
GLOVE BIOGEL PI INDICATOR 7.0 (GLOVE) ×1
GLOVE BIOGEL PI INDICATOR 7.5 (GLOVE)
GLOVE BIOGEL PI INDICATOR 8 (GLOVE) ×1
GOWN STRL REUS W/ TWL LRG LVL3 (GOWN DISPOSABLE) ×2 IMPLANT
GOWN STRL REUS W/ TWL XL LVL3 (GOWN DISPOSABLE) ×1 IMPLANT
GOWN STRL REUS W/TWL LRG LVL3 (GOWN DISPOSABLE) ×4
GOWN STRL REUS W/TWL XL LVL3 (GOWN DISPOSABLE) ×2
K-WIRE ACE 1.6X6 (WIRE) ×4
KIT BASIN OR (CUSTOM PROCEDURE TRAY) ×2 IMPLANT
KIT ROOM TURNOVER OR (KITS) ×2 IMPLANT
KWIRE ACE 1.6X6 (WIRE) IMPLANT
NEEDLE 22X1 1/2 (OR ONLY) (NEEDLE) IMPLANT
NS IRRIG 1000ML POUR BTL (IV SOLUTION) ×2 IMPLANT
PACK ORTHO EXTREMITY (CUSTOM PROCEDURE TRAY) ×2 IMPLANT
PAD ABD 8X10 STRL (GAUZE/BANDAGES/DRESSINGS) ×1 IMPLANT
PAD ARMBOARD 7.5X6 YLW CONV (MISCELLANEOUS) ×4 IMPLANT
PAD CAST 4YDX4 CTTN HI CHSV (CAST SUPPLIES) ×1 IMPLANT
PADDING CAST COTTON 4X4 STRL (CAST SUPPLIES) ×2
PADDING CAST COTTON 6X4 STRL (CAST SUPPLIES) ×1 IMPLANT
PLATE 6H 3.5 143556 (Plate) ×1 IMPLANT
SCREW ACE CAN 4.0 44M (Screw) ×2 IMPLANT
SCREW CORT 3.5X16 815037016 (Screw) ×2 IMPLANT
SCREW CORT 3.5X30 815037030 (Screw) ×1 IMPLANT
SCREW CORTICAL 3.5MM 14MM (Screw) ×1 IMPLANT
SCREW CORTICAL 3.5MM 18MM (Screw) ×2 IMPLANT
SPONGE GAUZE 4X4 12PLY (GAUZE/BANDAGES/DRESSINGS) IMPLANT
SPONGE GAUZE 4X4 12PLY STER LF (GAUZE/BANDAGES/DRESSINGS) ×1 IMPLANT
SPONGE LAP 18X18 X RAY DECT (DISPOSABLE) ×2 IMPLANT
STAPLER VISISTAT 35W (STAPLE) IMPLANT
SUCTION FRAZIER TIP 10 FR DISP (SUCTIONS) ×2 IMPLANT
SUT MNCRL AB 3-0 PS2 18 (SUTURE) IMPLANT
SUT PROLENE 3 0 PS 2 (SUTURE) ×2 IMPLANT
SUT VIC AB 2-0 CT1 27 (SUTURE) ×4
SUT VIC AB 2-0 CT1 TAPERPNT 27 (SUTURE) ×2 IMPLANT
SUT VIC AB 3-0 PS2 18 (SUTURE) ×2
SUT VIC AB 3-0 PS2 18XBRD (SUTURE) ×1 IMPLANT
SYR CONTROL 10ML LL (SYRINGE) IMPLANT
TOWEL OR 17X24 6PK STRL BLUE (TOWEL DISPOSABLE) ×2 IMPLANT
TOWEL OR 17X26 10 PK STRL BLUE (TOWEL DISPOSABLE) ×2 IMPLANT
TUBE CONNECTING 12X1/4 (SUCTIONS) ×2 IMPLANT
WATER STERILE IRR 1000ML POUR (IV SOLUTION) ×2 IMPLANT

## 2014-07-02 NOTE — Brief Op Note (Signed)
07/02/2014  2:37 PM  PATIENT:  Angie Velasquez  68 y.o. female  PRE-OPERATIVE DIAGNOSIS:  Right ankle bimalleolar fracture  POST-OPERATIVE DIAGNOSIS:  same  Procedure(s): 1.  OPEN REDUCTION INTERNAL FIXATION (ORIF) RIGHT ANKLE BIMALLOELAR FRACTURE 2.  Stress exam of right ankle under fluoro 3.  Ap, lat and mortise xrays of right ankle.  SURGEON:  Wylene Simmer, MD  ASSISTANT:  Lorin Mercy, PA-C  ANESTHESIA:   General, regional  EBL:  minimal   TOURNIQUET:   Total Tourniquet Time Documented: Thigh (Right) - 35 minutes Total: Thigh (Right) - 35 minutes   COMPLICATIONS:  None apparent  DISPOSITION:  Extubated, awake and stable to recovery.  DICTATION ID:  [id number]

## 2014-07-02 NOTE — Anesthesia Preprocedure Evaluation (Addendum)
Anesthesia Evaluation  Patient identified by MRN, date of birth, ID band Patient awake    Reviewed: Allergy & Precautions, H&P , NPO status , Patient's Chart, lab work & pertinent test results  History of Anesthesia Complications (+) PONVNegative for: history of anesthetic complications  Airway Mallampati: II  Neck ROM: full    Dental   Pulmonary former smoker,          Cardiovascular negative cardio ROS      Neuro/Psych Anxiety    GI/Hepatic GERD-  ,  Endo/Other    Renal/GU      Musculoskeletal  (+) Arthritis -,   Abdominal   Peds  Hematology   Anesthesia Other Findings   Reproductive/Obstetrics                          Anesthesia Physical Anesthesia Plan  ASA: II  Anesthesia Plan: General and Regional   Post-op Pain Management: MAC Combined w/ Regional for Post-op pain   Induction: Intravenous  Airway Management Planned: Oral ETT  Additional Equipment:   Intra-op Plan:   Post-operative Plan: Extubation in OR  Informed Consent: I have reviewed the patients History and Physical, chart, labs and discussed the procedure including the risks, benefits and alternatives for the proposed anesthesia with the patient or authorized representative who has indicated his/her understanding and acceptance.   Dental advisory given  Plan Discussed with: CRNA, Anesthesiologist and Surgeon  Anesthesia Plan Comments:        Anesthesia Quick Evaluation

## 2014-07-02 NOTE — Anesthesia Postprocedure Evaluation (Signed)
  Anesthesia Post-op Note  Patient: Angie Velasquez  Procedure(s) Performed: Procedure(s): OPEN REDUCTION INTERNAL FIXATION (ORIF) RIGHT ANKLE BIMALLOELAR FRACTURE (Right)  Patient Location: PACU  Anesthesia Type:General and block  Level of Consciousness: awake and alert   Airway and Oxygen Therapy: Patient Spontanous Breathing  Post-op Pain: none  Post-op Assessment: Post-op Vital signs reviewed, Patient's Cardiovascular Status Stable and Respiratory Function Stable  Post-op Vital Signs: Reviewed  Filed Vitals:   07/02/14 1500  BP:   Pulse: 71  Temp:   Resp: 9    Complications: No apparent anesthesia complications

## 2014-07-02 NOTE — Op Note (Signed)
NAMEMarland Kitchen  Angie Velasquez, Angie Velasquez NO.:  [id number]  MEDICAL RECORD NO.:  [id number]  LOCATION:  5N05C                        FACILITY:  [place]  PHYSICIAN:  Wylene Simmer, MD        DATE OF BIRTH:  09/05/1946  DATE OF PROCEDURE:  07/02/2014 DATE OF DISCHARGE:                              OPERATIVE REPORT   PREOPERATIVE DIAGNOSIS:  Right ankle bimalleolar fracture.  POSTOPERATIVE DIAGNOSIS:  Right ankle bimalleolar fracture.  PROCEDURE: 1. Open reduction and internal fixation of right ankle bimalleolar     fracture. 2. Stress examination of right ankle under fluoroscopy. 3. AP, lateral, and mortise radiographs of the right ankle.  SURGEON:  Wylene Simmer, MD.  ASSISTANT:  Lorin Mercy, PA-C.  ANESTHESIA:  General, regional.  ESTIMATED BLOOD LOSS:  Minimal.  TOURNIQUET TIME:  35 minutes at 250 mmHg.  COMPLICATIONS:  None apparent.  DISPOSITION:  Extubated, awake, and stable to recovery.  INDICATIONS FOR PROCEDURE:  The patient is a 68 year old woman who stepped in a hole in her yard last Friday.  She twisted her ankle and was unable to bear weight afterwards.  Radiographs revealed a bimalleolar displaced ankle fracture.  She presents now for operative treatment of this painful unstable injury.  She understands the risks and benefits, the alternative treatment options, and elects surgical treatment.  She specifically understands risks of bleeding, infection, nerve damage, blood clots, need for additional surgery, continued pain, amputation, and death.  PROCEDURE IN DETAIL:  After preoperative consent was obtained, the correct operative site was identified.  The patient was brought to the operating room and placed supine on the operating table.  General anesthesia was induced.  Preoperative antibiotics were administered. Surgical time-out was taken.  The right lower extremity was prepped and draped in standard sterile fashion with a tourniquet around the thigh. The  extremity was exsanguinated and the tourniquet was inflated to 250 mmHg.  A longitudinal incision was made along the lateral malleolus. Sharp dissection was carried down through the skin and subcutaneous tissue.  The fracture site was identified.  It was cleaned of all hematoma.  Fracture was reduced and held with a tenaculum.  A 3.5-mm fully-threaded screw was inserted from posterior to anterior across the fracture site.  It was noted to have excellent purchase.  A 6 hole one- third tubular plate from the [employer] Small Frag set was then contoured to fit the lateral malleolus.  It was fixed distally with 3 unicortical screws and proximally with 3 bicortical screws.  Attention was then turned to the medial malleolus.  A longitudinal incision was made and sharp dissection was carried down through the skin and subcutaneous tissue.  Fracture site was identified.  There was cleaned of all hematoma and periosteum.  The fracture was reduced and held with a tenaculum.  Two K-wires were inserted across the fracture site.  AP and lateral radiographs confirmed appropriate position of both K-wires and appropriate reduction of the fracture site.  Two 44 mm x 4 mm partially-threaded cannulated screws were inserted over the guide pins and were both noted to have excellent purchase.  AP and lateral radiographs confirmed appropriate position and length of all hardware and appropriate reduction of both fractures.  Both guide pins were removed.  Mortise radiograph was then obtained.  Dorsiflexion and external rotation stress was applied.  There was no widening of the medial clear space or syndesmosis.  Both wounds were then irrigated copiously.  Deep subcutaneous tissues were approximated with simple sutures of 3-0 Monocryl.  Superficial subcutaneous tissues were approximated with inverted simple sutures of 3-0 Monocryl and a running 3-0 nylon, was used to close both skin incisions.  Sterile dressings were  applied followed by a well-padded short-leg splint.  Tourniquet was released at 35 minutes after application of the dressings.  The patient was awakened from anesthesia and transported to the recovery room in stable condition.  FOLLOWUP PLAN:  The patient will be nonweightbearing on the right lower extremity.  She will have physical therapy evaluation tomorrow for nonweightbearing and gait training.  She will follow up with me in 2 weeks for suture removal.  She will start Lovenox for DVT prophylaxis tomorrow.  RADIOGRAPHS:  AP, lateral, and mortise radiographs of the right ankle are obtained today.  These show interval reduction and fixation of the medial and lateral malleolus fractures.  No other acute injury is noted. Fractures appropriately positioned and of the appropriate length.  Lorin Mercy PA-C was present and scrubbed for the duration of the case. Her assistance was essentially gaining and maintaining exposure, performing the operation, closing and dressing the wounds, and applying the splint.     Wylene Simmer, MD     JH/MEDQ  D:  07/02/2014  T:  07/02/2014  Job:  [id number]

## 2014-07-02 NOTE — H&P (Signed)
Angie Velasquez is an 68 y.o. female.   Chief Complaint: right ankle fracture HPI:  68 y/o female with right ankle bimal fracture.  She presents now for operative treatment.  Past Medical History  Diagnosis Date  . GERD (gastroesophageal reflux disease)   . Proteinuria     H/O  . Hx of chest pain     "anxiety"  . Palpitations     H/O (anxiety)  . Hyperlipidemia   . Tobacco abuse     quit smoking 08/2013  . Complication of anesthesia   . PONV (postoperative nausea and vomiting)   . Arthritis     gout  . Anxiety     years ago - no longer an issue  . Low blood sugar     Past Surgical History  Procedure Laterality Date  . Other surgical history      hysterectomy  . Cholecystectomy  2001  . Shoulder surgery      bone spurs left  . Kidney surgery  1992    complex cystic mass - nephrectomy right  . Childbirth      x4  . Abdominal hysterectomy    . Eye surgery Bilateral 2015    cataract surgery with lens implant    History reviewed. No pertinent family history. Social History:  reports that she quit smoking about 10 months ago. Her smoking use included Cigarettes. She smoked 0.00 packs per day. She has never used smokeless tobacco. She reports that she does not drink alcohol or use illicit drugs.  Allergies:  Allergies  Allergen Reactions  . Codeine Nausea Only  . Penicillins Nausea Only    Medications Prior to Admission  Medication Sig Dispense Refill  . allopurinol (ZYLOPRIM) 300 MG tablet Take 300 mg by mouth 2 (two) times daily.      [name] esomeprazole (NEXIUM 24HR) 20 MG capsule Take 20 mg by mouth every morning.      [name] lisinopril (PRINIVIL,ZESTRIL) 2.5 MG tablet Take 2.5 mg by mouth daily.      . Simvastatin (ZOCOR PO) Take 1 tablet by mouth daily.         Results for orders placed during the hospital encounter of 07/02/14 (from the past 48 hour(s))  GLUCOSE, CAPILLARY     Status: Abnormal   Collection Time    07/02/14 11:09 AM      Result Value Ref Range    Glucose-Capillary 103 (*) 70 - 99 mg/dL  CBC     Status: None   Collection Time    07/02/14 11:43 AM      Result Value Ref Range   WBC 9.6  4.0 - 10.5 K/uL   RBC 4.29  3.87 - 5.11 MIL/uL   Hemoglobin 12.6  12.0 - 15.0 g/dL   HCT 38.3  36.0 - 46.0 %   MCV 89.3  78.0 - 100.0 fL   MCH 29.4  26.0 - 34.0 pg   MCHC 32.9  30.0 - 36.0 g/dL   RDW 14.5  11.5 - 15.5 %   Platelets 230  150 - 400 K/uL  BASIC METABOLIC PANEL     Status: Abnormal   Collection Time    07/02/14 11:43 AM      Result Value Ref Range   Sodium 141  137 - 147 mEq/L   Potassium 4.6  3.7 - 5.3 mEq/L   Chloride 102  96 - 112 mEq/L   CO2 23  19 - 32 mEq/L   Glucose, Bld 99  70 -  99 mg/dL   BUN 25 (*) 6 - 23 mg/dL   Creatinine, Ser 1.07  0.50 - 1.10 mg/dL   Calcium 9.3  8.4 - 10.5 mg/dL   GFR calc non Af Amer 52 (*) >90 mL/min   GFR calc Af Amer 61 (*) >90 mL/min   Comment: (NOTE)     The eGFR has been calculated using the CKD EPI equation.     This calculation has not been validated in all clinical situations.     eGFR's persistently <90 mL/min signify possible Chronic Kidney     Disease.   Anion gap 16 (*) 5 - 15   No results found.  ROS  No recent f/c/n/v/wt loss.  R ankle pain as above.  Blood pressure 105/59, pulse 85, temperature 98.1 F (36.7 C), temperature source Oral, resp. rate 20, height _0  (1.702 m), weight 102.059 kg (225 lb), SpO2 98.00%. Physical Exam  wn wd woman in nad.  A and O x 4.  Mood and affect normal.  EOMI.  resp unlabored.  R ankle with moderate swelling.  1+ dp and pt pulses.  Normal sens to LT at the foot.  5/5 strength in PF and DF of the toes.  Assessment/Plan R ankle bimal fracture - to OR for ORIf.  The risks and benefits of the alternative treatment options have been discussed in detail.  The patient wishes to proceed with surgery and specifically understands risks of bleeding, infection, nerve damage, blood clots, need for additional surgery, amputation and  death.   Wylene Simmer 07/10/14, 1:05 PM

## 2014-07-02 NOTE — Transfer of Care (Signed)
Immediate Anesthesia Transfer of Care Note  Patient: Angie Velasquez  Procedure(s) Performed: Procedure(s): OPEN REDUCTION INTERNAL FIXATION (ORIF) RIGHT ANKLE BIMALLOELAR FRACTURE (Right)  Patient Location: PACU  Anesthesia Type:General  Level of Consciousness: awake, alert  and oriented  Airway & Oxygen Therapy: Patient Spontanous Breathing and Patient connected to nasal cannula oxygen  Post-op Assessment: Report given to PACU RN, Post -op Vital signs reviewed and stable and Patient moving all extremities X 4  Post vital signs: Reviewed and stable  Complications: No apparent anesthesia complications

## 2014-07-02 NOTE — Anesthesia Procedure Notes (Addendum)
Anesthesia Regional Block:  Popliteal block  Pre-Anesthetic Checklist: ,, timeout performed, Correct Patient, Correct Site, Correct Laterality, Correct Procedure, Correct Position, site marked, Risks and benefits discussed,  Surgical consent,  Pre-op evaluation,  At surgeon's request and post-op pain management  Laterality: Right  Prep: chloraprep       Needles:  Injection technique: Single-shot  Needle Type: Echogenic Stimulator Needle     Needle Length: 9cm 9 cm Needle Gauge: 21 and 21 G    Additional Needles:  Procedures: ultrasound guided (picture in chart) and nerve stimulator Popliteal block  Nerve Stimulator or Paresthesia:  Response: plantar flexion of foot, 0.45 mA,   Additional Responses:   Narrative:  Start time: 07/02/2014 12:36 PM End time: 07/02/2014 12:51 PM Injection made incrementally with aspirations every 5 mL.  Performed by: Personally  Anesthesiologist: Dr Marcie Bal  Additional Notes: Functioning IV was confirmed and monitors were applied.  A 57mm 21ga Arrow echogenic stimulator needle was used. Sterile prep and drape,hand hygiene and sterile gloves were used.  Negative aspiration and negative test dose prior to incremental administration of local anesthetic. The patient tolerated the procedure well.  Ultrasound guidance: relevent anatomy identified, needle position confirmed, local anesthetic spread visualized around nerve(s), vascular puncture avoided.  Image printed for medical record.   Adductor canal block done in addition to the popliteal block.  53mL of 0.5% bupivacaine + epi were given for each block.  Patient tolerated the procedures well.   Procedure Name: Intubation Date/Time: 07/02/2014 1:28 PM Performed by: Trixie Deis A Pre-anesthesia Checklist: Patient identified, Timeout performed, Emergency Drugs available, Suction available and Patient being monitored Patient Re-evaluated:Patient Re-evaluated prior to inductionOxygen Delivery Method:  Circle system utilized Preoxygenation: Pre-oxygenation with 100% oxygen Intubation Type: IV induction Ventilation: Mask ventilation without difficulty Laryngoscope Size: Mac and 3 Grade View: Grade II Tube type: Oral Tube size: 7.5 mm Number of attempts: 2 (DLx1 CRNA grade III, DLx1 MDA grade II) Airway Equipment and Method: Stylet Placement Confirmation: ETT inserted through vocal cords under direct vision,  breath sounds checked- equal and bilateral and positive ETCO2 Secured at: 21 cm Tube secured with: Tape Dental Injury: Teeth and Oropharynx as per pre-operative assessment

## 2014-07-03 MED ORDER — HYDROCODONE-ACETAMINOPHEN 5-325 MG PO TABS: 1.0000 | ORAL_TABLET | Freq: Four times a day (QID) | ORAL | Status: DC | PRN

## 2014-07-03 NOTE — Discharge Summary (Signed)
  Physician Discharge Summary  Patient ID: Angie Velasquez MRN: [id number] DOB/AGE: May 29, 1946 68 y.o.  Admit date: 07/02/2014 Discharge date: 07/03/2014  Admission Diagnoses: R ankle bimalleolar fracture  Discharge Diagnoses:  Active Problems:   Bimalleolar fracture   Discharged Condition: good  Hospital Course: [name] presented to [facility] for ORIF of her R ankle bimalleolar fracture on 07/02/14. She has done well overnight and this morning. She has minimal complaints today with mild pain in her R ankle. No new HA, CP, SOB, N/V/D, fever, chills, calf pain or swelling.   Consults: None  Significant Diagnostic Studies: radiology: X-Ray: AP and lateral views post-operatively demonstrate proper reduction of both fracture sites with appropriate hardware placement.   Treatments: po pain medications prn. Up with PT.  Discharge Exam: Blood pressure 119/54, pulse 95, temperature 98.3 F (36.8 C), temperature source Oral, resp. rate 18, height 5\' 7"  (1.702 m), weight 102.059 kg (225 lb), SpO2 96.00%. WD/WN caucasian female in nad, laying comfortably in bed. A and O x4. Mood and affect appropriate. EOMI. Respirations normal and unlabored. Hard splint and dressing applied to RLE. Dressings are C/D/I. NV intact bilaterally. No calf swelling or cords.   Disposition: d/c home today. NWB in hard splint. 325mg  ASA for DVT prophylaxis. PO hydrocodone prn pain.      Medication List    ASK your doctor about these medications       allopurinol 300 MG tablet  Commonly known as:  ZYLOPRIM  Take 300 mg by mouth 2 (two) times daily.     lisinopril 2.5 MG tablet  Commonly known as:  PRINIVIL,ZESTRIL  Take 2.5 mg by mouth daily.     NEXIUM 24HR 20 MG capsule  Generic drug:  esomeprazole  Take 20 mg by mouth every morning.     ZOCOR PO  Take 1 tablet by mouth daily.         SignedTheressa Millard 07/03/2014, 7:24 AM

## 2014-07-03 NOTE — Progress Notes (Signed)
Patient provided with discharge instructions and follow up information. She is going home at this time with HHPT and all her DME. Educated on pain medication and pain regimen. She is going home at this time with friend support.

## 2014-07-03 NOTE — Progress Notes (Signed)
Utilization review completed. Delma Drone, RN, BSN. 

## 2014-07-03 NOTE — Care Management Note (Signed)
CARE MANAGEMENT NOTE 07/03/2014  Patient:  Angie Velasquez, Angie Velasquez   Account Number:  [id number]  Date Initiated:  07/03/2014  Documentation initiated by:  Ricki Miller  Subjective/Objective Assessment:   68 yr old female s/p right ankle fracture with ORIF.     Action/Plan:   Case manager spoke with patient concerning home health and DME needs at discharge. Choice offered. Patient working on plans to have friends assist at discharge. Referral called to Pearletha Forge, [facility] liaison.   Anticipated DC Date:  07/03/2014   Anticipated DC Plan:  [place]  In-house referral  NA      DC Planning Services  CM consult      PAC Choice  [place]   Choice offered to / List presented to:  C-1 Patient   DME arranged  [place]  3-N-1      DME agency  [employer]     [place] arranged  [place].   Status of service:  Completed, signed off Medicare Important Message given?  NA - LOS <3 / Initial given by admissions (If response is "NO", the following Medicare IM given date fields will be blank) Date Medicare IM given:   Medicare IM given by:   Date Additional Medicare IM given:   Additional Medicare IM given by:    Discharge Disposition:  [place]  Per UR Regulation:  Reviewed for med. necessity/level of care/duration of stay  If discussed at [place] of Stay Meetings, dates discussed:

## 2014-07-03 NOTE — Progress Notes (Signed)
Physical Therapy Treatment Patient Details Name: Angie Velasquez MRN: [id number] DOB: May 20, 1946 Today's Date: 07/03/2014    History of Present Illness 68 y.o. female s/p OPEN REDUCTION INTERNAL FIXATION (ORIF) RIGHT ANKLE BIMALLEOLAR FRACTURE. Hx of GERD and low blood sugar.    PT Comments    Patient progressing towards physical therapy goals. Ambulated in room up to 20 feet including turns and backwards stepping while using a rolling walker at a min guard level. Tolerated transfer training well and verbalizes understanding of safe wheelchair use. Reports she is ready to go home and has no further questions. Feel patient is adequate for d/c from a mobility standpoint as she has adequate DME and care at home with neighbors and a sister coming into town to care for her. Patient will continue to benefit from skilled physical therapy services at home with HHPT to further improve independence with functional mobility.   Follow Up Recommendations  Home health PT;Supervision - Intermittent     Equipment Recommendations  3in1 (PT);Wheelchair (measurements PT);Wheelchair cushion (measurements PT);Other (comment) (Home health aide)    Recommendations for Other Services OT consult     Precautions / Restrictions Precautions Precautions: Fall Restrictions Weight Bearing Restrictions: Yes RLE Weight Bearing: Non weight bearing    Mobility  Bed Mobility Overal bed mobility: Modified Independent             General bed mobility comments: Bed flat, requires extra time.  Transfers Overall transfer level: Needs assistance Equipment used: Rolling walker (2 wheeled) Transfers: Sit to/from Stand Sit to Stand: Supervision         General transfer comment: Supervision for safety from lowest bed setting. Requires extra time. Practiced also from wheel chair. VC for hand placement. Good stability upon stand. Needed VC once for NWB on RLE.  Ambulation/Gait Ambulation/Gait assistance:  Min guard Ambulation Distance (Feet): 20 Feet Assistive device: Rolling walker (2 wheeled) Gait Pattern/deviations:  ("hop-to" pattern)   Gait velocity interpretation: Below normal speed for age/gender General Gait Details: VC for forward gaze and instructions for use of UEs to allow smooth step transition with LLE. No loss of balance noted. Pt required 2 standing rest breaks due to fatigue.   Stairs            Information systems manager mobility: Yes Wheelchair parts: Consulting civil engineer Details (indicate cue type and reason): Pt able to state safe techniques for w/c use. Able to teach back use of breaks.  Modified Rankin (Stroke Patients Only)       Balance                                    Cognition Arousal/Alertness: Awake/alert Behavior During Therapy: WFL for tasks assessed/performed Overall Cognitive Status: Within Functional Limits for tasks assessed                      Exercises General Exercises - Lower Extremity Ankle Circles/Pumps: AROM;Left;10 reps;Seated Quad Sets: AROM;Right;5 reps;Seated Long Arc Quad: Right;5 reps;Strengthening;Seated    General Comments        Pertinent Vitals/Pain Pt reports pain is "improving a little" no numerical value given Patient repositioned in chair for comfort.     Home Living                      Prior Function  PT Goals (current goals can now be found in the care plan section) Acute Rehab PT Goals Patient Stated Goal: Go home with help if possible PT Goal Formulation: With patient Time For Goal Achievement: 07/10/14 Potential to Achieve Goals: Good Progress towards PT goals: Progressing toward goals    Frequency  Min 5X/week    PT Plan Current plan remains appropriate    Co-evaluation             End of Session   Activity Tolerance: Patient tolerated treatment well Patient left: in chair;with call  bell/phone within reach;with family/visitor present     Time: 1761-6073 PT Time Calculation (min): 15 min  Charges:  $Gait Training: 8-22 mins                    G Codes:      [employer], [place]  Ellouise Newer 07/03/2014, 5:15 PM

## 2014-07-03 NOTE — Evaluation (Signed)
Physical Therapy Evaluation Patient Details Name: Angie Velasquez MRN: [id number] DOB: [birth year] Today's Date: 07/03/2014   History of Present Illness  69 y.o. female s/p OPEN REDUCTION INTERNAL FIXATION (ORIF) RIGHT ANKLE BIMALLEOLAR FRACTURE. Hx of GERD and low blood sugar.  Clinical Impression  Patient is seen following the above procedure and presents with functional limitations due to the deficits listed below (see PT Problem List). Limited ambulatory distance due to pain, however transfers safely. Long discussion concerning needs for a safe d/c to home and pt agrees it would be best to have a wheelchair and home health aid to visit during the day since she lives alone. Patient will benefit from skilled PT to increase their independence and safety with mobility to allow discharge to the venue listed below.       Follow Up Recommendations Home health PT;Supervision - Intermittent    Equipment Recommendations  3in1 (PT);Wheelchair (measurements PT);Wheelchair cushion (measurements PT);Other (comment) (Home health aide)    Recommendations for Other Services OT consult     Precautions / Restrictions Precautions Precautions: Fall Restrictions Weight Bearing Restrictions: Yes RLE Weight Bearing: Non weight bearing      Mobility  Bed Mobility Overal bed mobility: Modified Independent             General bed mobility comments: Bed flat, requires extra time.  Transfers Overall transfer level: Needs assistance Equipment used: Rolling walker (2 wheeled) Transfers: Sit to/from Stand Sit to Stand: Supervision;From elevated surface         General transfer comment: Supervision for safety with VC for technique and hand placement. Performed x3 from elevated bed surface, similar to home environment.  Ambulation/Gait Ambulation/Gait assistance: Min guard Ambulation Distance (Feet): 8 Feet Assistive device: Rolling walker (2 wheeled) Gait Pattern/deviations:  ("hop-to"  pattern)   Gait velocity interpretation: Below normal speed for age/gender General Gait Details: Educated on safe DME use with VC for smoot left LE advancement vs "hop" by using UEs for support. No loss of balance but limited distance due to pain. Safely maintains NWB on RLE at all times.  Stairs            Wheelchair Mobility    Modified Rankin (Stroke Patients Only)       Balance Overall balance assessment: Needs assistance Sitting-balance support: Feet supported;No upper extremity supported Sitting balance-Leahy Scale: Good     Standing balance support: No upper extremity supported Standing balance-Leahy Scale: Fair                               Pertinent Vitals/Pain 8/10 pain Nurse has recently given pt pain medication prior to therapy session Patient repositioned in chair for comfort.     Home Living Family/patient expects to be discharged to:: Private residence Living Arrangements: Alone Available Help at Discharge: Other (Comment) (Unsure) Type of Home: House Home Access: Level entry     Home Layout: One level Home Equipment: Walker - 2 wheels;Shower seat - built in      Prior Function Level of Independence: Independent               Hand Dominance   Dominant Hand: Right    Extremity/Trunk Assessment   Upper Extremity Assessment: Defer to OT evaluation           Lower Extremity Assessment: RLE deficits/detail RLE Deficits / Details: Decreased strength and ROM as expected post op. Good toe mobility and normal sensation  Communication   Communication: No difficulties  Cognition Arousal/Alertness: Awake/alert Behavior During Therapy: WFL for tasks assessed/performed Overall Cognitive Status: Within Functional Limits for tasks assessed                      General Comments      Exercises General Exercises - Lower Extremity Ankle Circles/Pumps: AROM;Left;10 reps;Seated      Assessment/Plan    PT  Assessment Patient needs continued PT services  PT Diagnosis Difficulty walking;Abnormality of gait;Acute pain   PT Problem List Decreased strength;Decreased range of motion;Decreased activity tolerance;Decreased balance;Decreased mobility;Decreased knowledge of use of DME;Pain  PT Treatment Interventions DME instruction;Gait training;Functional mobility training;Therapeutic activities;Therapeutic exercise;Balance training;Neuromuscular re-education;Patient/family education;Wheelchair mobility training;Modalities   PT Goals (Current goals can be found in the Care Plan section) Acute Rehab PT Goals Patient Stated Goal: Go home with help if possible PT Goal Formulation: With patient Time For Goal Achievement: 07/10/14 Potential to Achieve Goals: Good Additional Goals Additional Goal #1: Propel w/c >125 with Mod I    Frequency Min 5X/week   Barriers to discharge Decreased caregiver support Lives alone    Co-evaluation               End of Session   Activity Tolerance: Patient limited by pain Patient left: in chair;with call bell/phone within reach Nurse Communication: Mobility status         Time: 0569-7948 PT Time Calculation (min): 31 min   Charges:   PT Evaluation $Initial PT Evaluation Tier I: 1 Procedure PT Treatments $Therapeutic Activity: 8-22 mins   PT G Codes:         Elayne Snare, [place]  Ellouise Newer 07/03/2014, 9:56 AM

## 2014-07-03 NOTE — Discharge Instructions (Signed)
Wylene Simmer, MD [facility]  Please read the following information regarding your care after surgery.  Medications  You only need a prescription for the narcotic pain medicine (ex. oxycodone, Percocet, Norco).  X hydrocodone as prescribed  Narcotic pain medicine (ex. oxycodone, Percocet, Vicodin) will cause constipation.  To prevent this problem, take the following medicines while you are taking any pain medicine. X docusate sodium (Colace) 100 mg twice a day X senna (Senokot) 2 tablets twice a day  X To help prevent blood clots, take an aspirin (325 mg) once a day for a month after surgery.  You should also get up every hour while you are awake to move around.    Weight Bearing ? Bear weight when you are able on your operated leg or foot. ? Bear weight only on the heel of your operated foot in the post-op shoe. XDo not bear any weight on the operated leg or foot.  Cast / Splint / Dressing X Keep your splint or cast clean and dry.  Dont put anything (coat hanger, pencil, etc) down inside of it.  If it gets damp, use a hair dryer on the cool setting to dry it.  If it gets soaked, call the office to schedule an appointment for a cast change.   After your dressing, cast or splint is removed; you may shower, but do not soak or scrub the wound.  Allow the water to run over it, and then gently pat it dry.  Swelling It is normal for you to have swelling where you had surgery.  To reduce swelling and pain, keep your toes above your nose for at least 3 days after surgery.  It may be necessary to keep your foot or leg elevated for several weeks.  If it hurts, it should be elevated.  Follow Up Call my office at [phone] when you are discharged from the hospital or surgery center to schedule an appointment to be seen two weeks after surgery.  Call my office at [phone] if you develop a fever >101.5 F, nausea, vomiting, bleeding from the surgical site or severe pain.

## 2014-07-06 ENCOUNTER — Encounter (HOSPITAL_COMMUNITY): Payer: Self-pay | Admitting: Orthopedic Surgery

## 2015-03-12 ENCOUNTER — Encounter: Payer: Self-pay | Admitting: Family Medicine

## 2015-03-12 ENCOUNTER — Ambulatory Visit (INDEPENDENT_AMBULATORY_CARE_PROVIDER_SITE_OTHER): Payer: Commercial Managed Care - HMO | Admitting: Family Medicine

## 2015-03-12 ENCOUNTER — Ambulatory Visit: Payer: Medicare HMO | Admitting: Family Medicine

## 2015-03-12 VITALS — BP 106/58 | HR 83 | Temp 98.0°F | Wt 237.0 lb

## 2015-03-12 DIAGNOSIS — H9319 Tinnitus, unspecified ear: Secondary | ICD-10-CM | POA: Insufficient documentation

## 2015-03-12 DIAGNOSIS — E785 Hyperlipidemia, unspecified: Secondary | ICD-10-CM | POA: Insufficient documentation

## 2015-03-12 DIAGNOSIS — Z86010 Personal history of colonic polyps: Secondary | ICD-10-CM | POA: Insufficient documentation

## 2015-03-12 DIAGNOSIS — Z23 Encounter for immunization: Secondary | ICD-10-CM | POA: Diagnosis not present

## 2015-03-12 DIAGNOSIS — J449 Chronic obstructive pulmonary disease, unspecified: Secondary | ICD-10-CM | POA: Insufficient documentation

## 2015-03-12 DIAGNOSIS — R809 Proteinuria, unspecified: Secondary | ICD-10-CM

## 2015-03-12 DIAGNOSIS — Z905 Acquired absence of kidney: Secondary | ICD-10-CM | POA: Insufficient documentation

## 2015-03-12 DIAGNOSIS — M1990 Unspecified osteoarthritis, unspecified site: Secondary | ICD-10-CM

## 2015-03-12 DIAGNOSIS — Z87891 Personal history of nicotine dependence: Secondary | ICD-10-CM

## 2015-03-12 DIAGNOSIS — M109 Gout, unspecified: Secondary | ICD-10-CM | POA: Diagnosis not present

## 2015-03-12 DIAGNOSIS — K219 Gastro-esophageal reflux disease without esophagitis: Secondary | ICD-10-CM

## 2015-03-12 DIAGNOSIS — Z1211 Encounter for screening for malignant neoplasm of colon: Secondary | ICD-10-CM | POA: Diagnosis not present

## 2015-03-12 MED ORDER — DIAZEPAM 10 MG PO TABS: 10.0000 mg | ORAL_TABLET | Freq: Two times a day (BID) | ORAL | Status: DC | PRN

## 2015-03-12 NOTE — Assessment & Plan Note (Signed)
S: Symptoms of retrosternal burning are controlled on Nexium 20 mg A/P: Continue Nexium

## 2015-03-12 NOTE — Assessment & Plan Note (Signed)
S: History of gout starting 2014. Patient has been stable on allopurinol with rare attacks. pResume controlled A/P: Continue allopurinol and refill. Check uric acid before physical

## 2015-03-12 NOTE — Assessment & Plan Note (Signed)
S: GFR stable in the 40-55 range. She was started on lisinopril by Dr. Leanne Chang. Previously A/P: I believe lisinopril is reasonable to continue. Refill today.

## 2015-03-12 NOTE — Assessment & Plan Note (Signed)
Last colonoscopy was in 2002. Refer for colonoscopy at this time

## 2015-03-12 NOTE — Assessment & Plan Note (Signed)
S: Patient states her lipids have been controlled on simvastatin 20 mg. No records available A/P: Refill simvastatin 20 mg. Likely renal protective as well. Check lipids before physical

## 2015-03-12 NOTE — Assessment & Plan Note (Signed)
S: She has some arthritic changes in her neck. She has a history of muscle spasms in her neck. Used to receive Valium 10 mg from Dr. Leanne Chang for when necessary use. She requests a refill this today. She states #30 usually last for year. A/P: Refill #30 valium 10mg  for occasional muscle spasms of neck with severe pain. Discussed trying half dose.

## 2015-03-12 NOTE — Patient Instructions (Addendum)
Received final pneumonia shot today ([id number]) and Zostavax.  Schedule mammogram.  [facility] will call you to schedule Colonoscopy.  Thank you for reviewing your history with me today. I will take over rx when needed and did provide a year's worth of valium for as needed use for muscle spasms. If you notice unsteadiness or issues with this, we may need to cut down future dose.   Tdap is limited for cuts/abrasions when on medicare-only thing we cannot help update you on.

## 2015-03-12 NOTE — Progress Notes (Signed)
Angie Reddish, MD Phone: [phone]  Subjective:  Patient presents today to establish care. Had been seen previously by Dr. Leanne Chang in 2009 last. 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 Chief complaint-noted.   Please see problem-oriented charting ROS-pertinent negatives:no change in urination pattern. No dysuria or polyuria. No nausea or vomiting. No chest pain or shortness of breath  The following were reviewed and entered/updated in epic: Past Medical History  Diagnosis Date  . GERD (gastroesophageal reflux disease)   . Proteinuria     H/O  . Hx of chest pain     "anxiety"  . Hyperlipidemia   . Tobacco abuse     quit smoking 08/2013  . Complication of anesthesia   . Arthritis     gout  . Anxiety     years ago - no longer an issue  . Low blood sugar     eats several smaller meals a day  . INSOMNIA 10/14/2008    in past  . Palpitations     Stress related    Patient Active Problem List   Diagnosis Date Noted  . S/p nephrectomy 03/12/2015    Priority: High  . Gout 03/12/2015    Priority: Medium  . Hyperlipidemia 03/12/2015    Priority: Medium  . COPD (chronic obstructive pulmonary disease) 03/12/2015    Priority: Medium  . Former smoker     Priority: Medium  . MICROALBUMINURIA 01/23/2008    Priority: Medium  . Tinnitus 03/12/2015    Priority: Low  . History of colonic polyps 03/12/2015    Priority: Low  . Bimalleolar fracture 07/02/2014    Priority: Low  . Arthritis     Priority: Low  . GERD 01/23/2008    Priority: Low  . Microscopic hematuria 01/23/2008    Priority: Low  . UTI (urinary tract infection) 01/08/2008    Priority: Low   Past Surgical History  Procedure Laterality Date  . Other surgical history      hysterectomy, R ovary remains  . Cholecystectomy  2001  . Shoulder surgery      bone spurs left  . Nephrectomy  1992    complex cystic mass - nephrectomy right  . Childbirth      x4  . Eye surgery  Bilateral 2015    cataract surgery with lens implant  . Orif ankle fracture Right 07/02/2014    Procedure: OPEN REDUCTION INTERNAL FIXATION (ORIF) RIGHT ANKLE BIMALLOELAR FRACTURE;  Surgeon: Wylene Simmer, MD;  Location: [facility];  Service: Orthopedics;  Laterality: Right;  . Appendectomy      with hysterectomy    Family History  Problem Relation Age of Onset  . Stroke Father   . Alzheimer's disease Father     early, states also parkinsons. died 45  . Hypertension Father   . Ovarian cancer Mother     Dr. Jana Hakim   . Arthritis Mother     Medications- reviewed and updated Current Outpatient Prescriptions  Medication Sig Dispense Refill  . allopurinol (ZYLOPRIM) 300 MG tablet Take 300 mg by mouth 2 (two) times daily.    [name] esomeprazole (NEXIUM 24HR) 20 MG capsule Take 20 mg by mouth every morning.    [name] lisinopril (PRINIVIL,ZESTRIL) 2.5 MG tablet Take 2.5 mg by mouth daily.    . Simvastatin (ZOCOR PO) Take 1 tablet by mouth daily.      No current facility-administered medications for this visit.    Allergies-reviewed and updated Allergies  Allergen  Reactions  . Codeine Nausea Only  . Penicillins Nausea Only    History   Social History  . Marital Status: Divorced    Spouse Name: N/A  . Number of Children: N/A  . Years of Education: N/A   Social History Main Topics  . Smoking status: Former Smoker -- 1.00 packs/day for 50 years    Types: Cigarettes    Quit date: 09/01/2013  . Smokeless tobacco: Never Used  . Alcohol Use: No  . Drug Use: No  . Sexual Activity: Not on file   Other Topics Concern  . None   Social History Narrative   Divorced. Found love of her life recently in 2016 ([place]). 3 sons. 5 grandkids.       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 Oropharynx normal. TM normal. Eyes: sclera and lids normal, PERRLA Neck: no thyromegaly, no cervical lymphadenopathy CV: RRR no murmurs rubs or gallops Lungs: CTAB no crackles, wheeze, rhonchi Abdomen: soft/nontender/nondistended/normal bowel sounds. No rebound or guarding.  Ext: trace edema Skin: warm, dry, no rash  Neuro: 5/5 strength in upper and lower extremities, normal gait, normal reflexes   Assessment/Plan:  S/p nephrectomy S: GFR stable in the 40-55 range. She was started on lisinopril by Dr. Leanne Chang. Previously A/P: I believe lisinopril is reasonable to continue. Refill today.    GERD S: Symptoms of retrosternal burning are controlled on Nexium 20 mg A/P: Continue Nexium    Gout S: History of gout starting 2014. Patient has been stable on allopurinol with rare attacks. pResume controlled A/P: Continue allopurinol and refill. Check uric acid before physical    Hyperlipidemia S: Patient states her lipids have been controlled on simvastatin 20 mg. No records available A/P: Refill simvastatin 20 mg. Likely renal protective as well. Check lipids before physical    History of colonic polyps Last colonoscopy was in 2002. Refer for colonoscopy at this time   Arthritis S: She has some arthritic changes in her neck. She has a history of muscle spasms in her neck. Used to receive Valium 10 mg from Dr. Leanne Chang for when necessary use. She requests a refill this today. She states #30 usually last for year. A/P: Refill #30 valium 10mg  for occasional muscle spasms of neck with severe pain. Discussed trying half dose.     Follow-up for next available physical. Discuss lung cancer screening.  Orders  Placed This Encounter  Procedures  . Pneumococcal conjugate vaccine 13-valent  . Varicella-zoster vaccine subcutaneous  . CBC    [place]    Standing Status: Future     Number of Occurrences:      Standing Expiration Date: 03/11/2016  . Comprehensive metabolic panel    [place]    Standing Status: Future     Number of Occurrences:      Standing Expiration Date: 03/11/2016    Order Specific Question:  Has the patient fasted?    Answer:  No  . TSH  [place]    Standing Status: Future     Number of Occurrences:      Standing Expiration Date: 03/11/2016  . Lipid panel    [place]    Standing Status: Future     Number of Occurrences:      Standing Expiration Date: 03/11/2016    Order Specific Question:  Has the patient fasted?    Answer:  No  . Uric Acid    Standing Status: Future     Number of Occurrences:      Standing Expiration Date: 03/11/2016  . Ambulatory referral to Gastroenterology    Referral Priority:  Routine    Referral Type:  Consultation    Referral Reason:  Specialty Services Required    Requested Specialty:  Gastroenterology    Number of Visits Requested:  1  . POCT urinalysis dipstick    Standing Status: Future     Number of Occurrences:      Standing Expiration Date: 03/11/2016    Meds ordered this encounter  Medications  . diazepam (VALIUM) 10 MG tablet    Sig: Take 1 tablet (10 mg total) by mouth every 12 (twelve) hours as needed (muscle spasms of neck).    Dispense:  30 tablet    Refill:  0

## 2015-03-15 ENCOUNTER — Encounter: Payer: Self-pay | Admitting: Internal Medicine

## 2015-03-17 ENCOUNTER — Other Ambulatory Visit: Payer: Self-pay

## 2015-03-17 DIAGNOSIS — Z1231 Encounter for screening mammogram for malignant neoplasm of breast: Secondary | ICD-10-CM

## 2015-03-24 ENCOUNTER — Ambulatory Visit: Payer: Medicare HMO

## 2015-03-29 ENCOUNTER — Ambulatory Visit
Admission: RE | Admit: 2015-03-29 | Discharge: 2015-03-29 | Disposition: A | Discharge status: Home / Self Care | Payer: Medicare HMO | Source: Ambulatory Visit

## 2015-03-29 DIAGNOSIS — Z1231 Encounter for screening mammogram for malignant neoplasm of breast: Secondary | ICD-10-CM

## 2015-04-26 DIAGNOSIS — Z961 Presence of intraocular lens: Secondary | ICD-10-CM | POA: Diagnosis not present

## 2015-04-26 DIAGNOSIS — H10812 Pingueculitis, left eye: Secondary | ICD-10-CM | POA: Diagnosis not present

## 2015-04-26 DIAGNOSIS — H11152 Pinguecula, left eye: Secondary | ICD-10-CM | POA: Diagnosis not present

## 2015-04-27 ENCOUNTER — Encounter: Payer: Self-pay | Admitting: Adult Health

## 2015-04-27 ENCOUNTER — Ambulatory Visit (INDEPENDENT_AMBULATORY_CARE_PROVIDER_SITE_OTHER): Payer: Commercial Managed Care - HMO | Admitting: Adult Health

## 2015-04-27 VITALS — BP 104/64 | Temp 98.0°F | Ht 67.0 in | Wt 235.5 lb

## 2015-04-27 DIAGNOSIS — T148 Other injury of unspecified body region: Secondary | ICD-10-CM | POA: Diagnosis not present

## 2015-04-27 DIAGNOSIS — W57XXXA Bitten or stung by nonvenomous insect and other nonvenomous arthropods, initial encounter: Secondary | ICD-10-CM

## 2015-04-27 NOTE — Patient Instructions (Addendum)
Continue to monitor for signs and symptoms of Lyme Disease. Follow up with me if you have any of these signs and symptoms.     [place] Spotted Fever [place] Spotted Fever (RMSF) is the oldest known tick-borne disease of people in the [place]. This disease was named because it was first described among people in the [facility] area who had an illness characterized by a rash with red-purple-black spots. This disease is caused by a rickettsia (Rickettsia rickettsii), a bacteria carried by the tick. The [facility] wood tick and the American dog tick acquire and transmit the RMSF bacteria (pictures NOT actual size). When a larval, nymphal, or adult tick feeds on an infected rodent or larger animal, the tick can become infected. Infected adult ticks then feed on people who may then get RMSF. The tick transmits the disease to humans during a prolonged period of feeding that lasts many hours, days, or even a couple weeks. The bite is painless and frequently goes unnoticed. An infected female tick may also pass the rickettsial bacteria to her eggs that then may mature to be infected adult ticks. The rickettsia that causes RMSF can also get into a person's body through damaged skin. A tick bite is not necessary. People can get RMSF if they crush a tick and get its blood or body fluids on their skin through a small cut or sore.  DIAGNOSIS Diagnosis is made by laboratory tests.  TREATMENT Treatment is with antibiotics (medications that kill rickettsia and other bacteria). Immediate treatment usually prevents death. GEOGRAPHIC RANGE This disease was reported only in the [facility] until 1931. RMSF has more recently been described among individuals in all states except [place], [place], and [place]. The highest reported incidences of RMSF now occur among residents of [place], [place], [place], and the [place]. TIME OF YEAR  Most cases are diagnosed during late spring and summer  when ticks are most active. However, especially in the warmer [place] states, a few cases occur during the winter. SYMPTOMS   Symptoms of RMSF begin from 2 to 14 days after a tick bite. The most common early symptoms are fever, muscle aches, and headache followed by nausea (feeling sick to your stomach) or vomiting.  The RMSF rash is typically delayed until 3 or more days after symptom onset, and eventually develops in 9 of 10 infected patients by the fifth day of illness. If the disease is not treated it can cause death. If you get a fever, headache, muscle aches, rash, nausea, or vomiting within 2 weeks of a possible tick bite or exposure, you should see your caregiver immediately. PREVENTION Ticks prefer to hide in shady, moist ground litter. They can often be found above the ground clinging to tall grass, brush, shrubs and low tree branches. They also inhabit lawns and gardens, especially at the edges of woodlands and around old stone walls. Within the areas where ticks generally live, no naturally vegetated area can be considered completely free of infected ticks. The best precaution against RMSF is to avoid contact with soil, leaf litter, and vegetation as much as possible in tick-infested areas. For those who enjoy gardening or walking in their yards, clear brush and mow tall grass around houses and at the edges of gardens. This may help reduce the tick population in the immediate area. Applications of chemical insecticides by a licensed professional in the spring (late May) and fall (September) will also control ticks, especially in heavily infested areas. Treatment will never  get rid of all the ticks. Getting rid of small animal populations that host ticks will also decrease the tick population. When working in the garden, [employer], or handling soil and vegetation, wear light-colored protective clothing and gloves. Spot-check often to prevent ticks from reaching the skin. Ticks cannot jump or  fly. They will not drop from an above-ground perch onto a passing animal. Once a tick gains access to human skin it climbs upward until it reaches a more protected area. For example, the back of the knee, groin, navel, armpit, ears, or nape of the neck. It then begins the slow process of embedding itself in the skin. Campers, hikers, field workers, and others who spend time in wooded, brushy, or tall grassy areas can avoid exposure to ticks by using the following precautions:  Wear light-colored clothing with a tight weave to spot ticks more easily and prevent contact with the skin.  Wear long pants tucked into socks, long-sleeved shirts tucked into pants and enclosed shoes or boots along with insect repellent.  Spray clothes with insect repellent containing either DEET or Permethrin. Only DEET can be used on exposed skin. Follow the manufacturer's directions carefully.  Wear a hat and keep long hair pulled back.  Stay on cleared, well-worn trails whenever possible.  Spot-check yourself and others often for the presence of ticks on clothes. If you find one, there are likely to be others. Check thoroughly.  Remove clothes after leaving tick-infested areas. If possible, wash them to eliminate any unseen ticks. Check yourself, your children and any pets from head to toe for the presence of ticks.  Shower and shampoo. You can greatly reduce your chances of contracting RMSF if you remove attached ticks as soon as possible. Regular checks of the body, including all body sites covered by hair (head, armpits, genitals), allow removal of the tick before rickettsial transmission. To remove an attached tick, use a forceps or tweezers to detach the intact tick without leaving mouth parts in the skin. The tick bite wound should be cleansed after tick removal. Remember the most common symptoms of RMSF are fever, muscle aches, headache, and nausea or vomiting with a later onset of rash. If you get these symptoms  after a tick bite and while living in an area where RMSF is found, RMSF should be suspected. If the disease is not treated, it can cause death. See your caregiver immediately if you get these symptoms. Do this even if not aware of a tick bite. Document Released: 03/04/2001 Document Revised: 04/06/2014 Document Reviewed: 10/25/2009 [facility] Patient Information 2015 [place], [place]. This information is not intended to replace advice given to you by your health care provider. Make sure you discuss any questions you have with your health care provider. Lyme Disease You may have been bitten by a tick and are to watch for the development of Lyme Disease. Lyme Disease is an infection that is caused by a bacteria The bacteria causing this disease is named Borreilia burgdorferi. If a tick is infected with this bacteria and then bites you, then Lyme Disease may occur. These ticks are carried by deer and rodents such as rabbits and mice and infest grassy as well as forested areas. Fortunately most tick bites do not cause Lyme Disease.  Lyme Disease is easier to prevent than to treat. First, covering your legs with clothing when walking in areas where ticks are possibly abundant will prevent their attachment because ticks tend to stay within inches of the ground. Second,  using insecticides containing DEET can be applied on skin or clothing. Last, because it takes about 12 to 24 hours for the tick to transmit the disease after attachment to the human host, you should inspect your body for ticks twice a day when you are in areas where Lyme Disease is common. You must look thoroughly when searching for ticks. The Ixodes tick that carries Lyme Disease is very small. It is around the size of a sesame seed (picture of tick is not actual size). Removal is best done by grasping the tick by the head and pulling it out. Do not to squeeze the body of the tick. This could inject the infecting bacteria into the bite site. Wash the area  of the bite with an antiseptic solution after removal.  Lyme Disease is a disease that may affect many body systems. Because of the small size of the biting tick, most people do not notice being bitten. The first sign of an infection is usually a round red rash that extends out from the center of the tick bite. The center of the lesion may be blood colored (hemorrhagic) or have tiny blisters (vesicular). Most lesions have bright red outer borders and partial central clearing. This rash may extend out many inches in diameter, and multiple lesions may be present. Other symptoms such as fatigue, headaches, chills and fever, general achiness and swelling of lymph glands may also occur. If this first stage of the disease is left untreated, these symptoms may gradually resolve by themselves, or progressive symptoms may occur because of spread of infection to other areas of the body.  Follow up with your caregiver to have testing and treatment if you have a tick bite and you develop any of the above complaints. Your caregiver may recommend preventative (prophylactic) medications which kill bacteria (antibiotics). Once a diagnosis of Lyme Disease is made, antibiotic treatment is highly likely to cure the disease. Effective treatment of late stage Lyme Disease may require longer courses of antibiotic therapy.  MAKE SURE YOU:   Understand these instructions.  Will watch your condition.  Will get help right away if you are not doing well or get worse. Document Released: 02/26/2001 Document Revised: 02/12/2012 Document Reviewed: 04/30/2009 [facility] Patient Information 2015 [place], [place]. This information is not intended to replace advice given to you by your health care provider. Make sure you discuss any questions you have with your health care provider.

## 2015-04-27 NOTE — Progress Notes (Signed)
Pre visit review using our clinic review tool, if applicable. No additional management support is needed unless otherwise documented below in the visit note. 

## 2015-04-27 NOTE — Progress Notes (Signed)
   Subjective:    Patient ID: Angie Velasquez, female    DOB: 1946-02-08, 69 y.o.   MRN: [id number]  HPI  Patient presents to the office today for two tick bites to her left hip. The first one she noticed three weeks ago and the second one she noticed two weeks ago. She does not have any rash, body aches, or fever.      Review of Systems  All other systems reviewed and are negative.      Objective:   Physical Exam  Constitutional: She is oriented to person, place, and time. She appears well-developed and well-nourished. No distress.  Cardiovascular: Intact distal pulses.   Musculoskeletal: Normal range of motion. She exhibits no edema or tenderness.  Neurological: She is alert and oriented to person, place, and time. No cranial nerve deficit. Coordination normal.  Skin: Skin is warm and dry. She is not diaphoretic.  Two small scabbed over, well healed tick bite marks. No tick noticed. No redness or swelling noted.   Psychiatric: She has a normal mood and affect. Her behavior is normal. Judgment and thought content normal.  Nursing note and vitals reviewed.      Assessment & Plan:  1. Tick bite - Has been greater than 72 hours since tick bite. ABX treatment is not warranted at this time.   - Follow up with any rash, fever or body aches.  -

## 2015-04-28 ENCOUNTER — Ambulatory Visit (AMBULATORY_SURGERY_CENTER): Payer: Self-pay

## 2015-04-28 VITALS — Ht 67.0 in | Wt 236.0 lb

## 2015-04-28 DIAGNOSIS — Z860100 Personal history of colon polyps, unspecified: Secondary | ICD-10-CM

## 2015-04-28 NOTE — Progress Notes (Signed)
No allergies to eggs or soy No diet/weight loss meds No home oxygen No past problems with anesthesia  Has email  Emmi instructions given for colonoscopy 

## 2015-05-10 DIAGNOSIS — Z961 Presence of intraocular lens: Secondary | ICD-10-CM | POA: Diagnosis not present

## 2015-05-10 DIAGNOSIS — H11152 Pinguecula, left eye: Secondary | ICD-10-CM | POA: Diagnosis not present

## 2015-05-10 DIAGNOSIS — H10413 Chronic giant papillary conjunctivitis, bilateral: Secondary | ICD-10-CM | POA: Diagnosis not present

## 2015-05-12 ENCOUNTER — Ambulatory Visit (AMBULATORY_SURGERY_CENTER): Payer: Commercial Managed Care - HMO | Admitting: Internal Medicine

## 2015-05-12 ENCOUNTER — Encounter: Payer: Self-pay | Admitting: Internal Medicine

## 2015-05-12 VITALS — BP 112/70 | HR 75 | Temp 97.5°F | Resp 19 | Ht 67.0 in | Wt 236.0 lb

## 2015-05-12 DIAGNOSIS — D129 Benign neoplasm of anus and anal canal: Secondary | ICD-10-CM

## 2015-05-12 DIAGNOSIS — D120 Benign neoplasm of cecum: Secondary | ICD-10-CM

## 2015-05-12 DIAGNOSIS — D128 Benign neoplasm of rectum: Secondary | ICD-10-CM | POA: Diagnosis not present

## 2015-05-12 DIAGNOSIS — Z86010 Personal history of colonic polyps: Secondary | ICD-10-CM

## 2015-05-12 MED ORDER — SODIUM CHLORIDE 0.9 % IV SOLN: 500.0000 mL | INTRAVENOUS | Status: DC

## 2015-05-12 NOTE — Progress Notes (Signed)
Called to room to assist during endoscopic procedure.  Patient ID and intended procedure confirmed with present staff. Received instructions for my participation in the procedure from the performing physician.  

## 2015-05-12 NOTE — Patient Instructions (Signed)
YOU HAD AN ENDOSCOPIC PROCEDURE TODAY AT [facility]:   Refer to the procedure report that was given to you for any specific questions about what was found during the examination.  If the procedure report does not answer your questions, please call your gastroenterologist to clarify.  If you requested that your care partner not be given the details of your procedure findings, then the procedure report has been included in a sealed envelope for you to review at your convenience later.  YOU SHOULD EXPECT: Some feelings of bloating in the abdomen. Passage of more gas than usual.  Walking can help get rid of the air that was put into your GI tract during the procedure and reduce the bloating. If you had a lower endoscopy (such as a colonoscopy or flexible sigmoidoscopy) you may notice spotting of blood in your stool or on the toilet paper. If you underwent a bowel prep for your procedure, you may not have a normal bowel movement for a few days.  Please Note:  You might notice some irritation and congestion in your nose or some drainage.  This is from the oxygen used during your procedure.  There is no need for concern and it should clear up in a day or so.  SYMPTOMS TO REPORT IMMEDIATELY:   Following lower endoscopy (colonoscopy or flexible sigmoidoscopy):  Excessive amounts of blood in the stool  Significant tenderness or worsening of abdominal pains  Swelling of the abdomen that is new, acute  Fever of 100F or higher    For urgent or emergent issues, a gastroenterologist can be reached at any hour by calling [phone].   DIET: Your first meal following the procedure should be a small meal and then it is ok to progress to your normal diet. Heavy or fried foods are harder to digest and may make you feel nauseous or bloated.  Likewise, meals heavy in dairy and vegetables can increase bloating.  Drink plenty of fluids but you should avoid alcoholic beverages for 24  hours.  ACTIVITY:  You should plan to take it easy for the rest of today and you should NOT DRIVE or use heavy machinery until tomorrow (because of the sedation medicines used during the test).    FOLLOW UP: Our staff will call the number listed on your records the next business day following your procedure to check on you and address any questions or concerns that you may have regarding the information given to you following your procedure. If we do not reach you, we will leave a message.  However, if you are feeling well and you are not experiencing any problems, there is no need to return our call.  We will assume that you have returned to your regular daily activities without incident.  If any biopsies were taken you will be contacted by phone or by letter within the next 1-3 weeks.  Please call us at [phone] if you have not heard about the biopsies in 3 weeks.    SIGNATURES/CONFIDENTIALITY: You and/or your care partner have signed paperwork which will be entered into your electronic medical record.  These signatures attest to the fact that that the information above on your After Visit Summary has been reviewed and is understood.  Full responsibility of the confidentiality of this discharge information lies with you and/or your care-partner.  Polyp, diverticulosis and high fiber diet given.

## 2015-05-12 NOTE — Op Note (Signed)
[place]  [address] [place], [id number]   COLONOSCOPY PROCEDURE REPORT  PATIENT: Angie, Velasquez  MR#: [id number] BIRTHDATE: 12-15-1945 , 36  yrs. old GENDER: female ENDOSCOPIST: Eustace Quail, MD REFERRED EX:[id number] Program Recall PROCEDURE DATE:  05/12/2015 PROCEDURE:   Colonoscopy, surveillance and Colonoscopy with snare polypectomy x 2 First Screening Colonoscopy - Avg.  risk and is 50 yrs.  old or older - No.  Prior Negative Screening - Now for repeat screening. N/A  History of Adenoma - Now for follow-up colonoscopy & has been > or = to 3 yrs.  Yes hx of adenoma.  Has been 3 or more years since last colonoscopy.  Polyps removed today? Yes ASA CLASS:   Class II INDICATIONS:Surveillance due to prior colonic neoplasia and PH Colon Adenoma.  . Index October 2003 with multiple adenomas. Overdue for follow-up MEDICATIONS: Monitored anesthesia care, Propofol 200 mg IV, and Lidocaine 200 mg IV  DESCRIPTION OF PROCEDURE:   After the risks benefits and alternatives of the procedure were thoroughly explained, informed consent was obtained.  The digital rectal exam revealed no abnormalities of the rectum.   The LB Olympus Loaner [id number] endoscope was introduced through the anus and advanced to the cecum, which was identified by both the appendix and ileocecal valve. No adverse events experienced.   The quality of the prep was excellent.  (MiraLax was used)  The instrument was then slowly withdrawn as the colon was fully examined. Estimated blood loss is zero unless otherwise noted in this procedure report.  COLON FINDINGS: A single polyp measuring 2 mm in size was found at the cecum.  A polypectomy was performed with a cold snare.  The resection was complete.   A single polyp measuring 8 mm in size was found in the rectum.  A polypectomy was performed using snare cautery.  The resection was complete. Polypoid tissue was completely retrieved from  both and sent to histology.   There was moderate diverticulosis in the left colon.   The examination was otherwise normal.  Retroflexed views revealed internal hemorrhoids. The time to cecum = 3.2 Withdrawal time = 11.2   The scope was withdrawn and the procedure completed. COMPLICATIONS: There were no immediate complications.  ENDOSCOPIC IMPRESSION: 1.   Single polyp was found at the cecum; polypectomy was performed with a cold snare 2.   Single polyp was found in the rectum; polypectomy was performed using snare cautery 3.   Moderate diverticulosis  in the left colon 4.   The examination was otherwise normal  RECOMMENDATIONS: 1. Follow up colonoscopy in 5 years  eSigned:  Eustace Quail, MD 05/12/2015 9:34 AM   cc: The Patient and [facility] Ol[facility]

## 2015-05-12 NOTE — Progress Notes (Signed)
Report to PACU, RN, vss, BBS= Clear.  

## 2015-05-13 ENCOUNTER — Telehealth: Payer: Self-pay

## 2015-05-13 NOTE — Telephone Encounter (Signed)
  Follow up Call-  Call back number 05/12/2015  Post procedure Call Back phone  # [phone]  Permission to leave phone message Yes     Patient questions:  Do you have a fever, pain , or abdominal swelling? No. Pain Score  0 *  Have you tolerated food without any problems? Yes.    Have you been able to return to your normal activities? Yes.    Do you have any questions about your discharge instructions: Diet   No. Medications  No. Follow up visit  No.  Do you have questions or concerns about your Care? No.  Actions: * If pain score is 4 or above: No action needed, pain <4.

## 2015-05-18 ENCOUNTER — Encounter: Payer: Self-pay | Admitting: Internal Medicine

## 2015-05-25 ENCOUNTER — Other Ambulatory Visit (INDEPENDENT_AMBULATORY_CARE_PROVIDER_SITE_OTHER): Payer: Commercial Managed Care - HMO

## 2015-05-25 DIAGNOSIS — M109 Gout, unspecified: Secondary | ICD-10-CM

## 2015-05-25 DIAGNOSIS — E785 Hyperlipidemia, unspecified: Secondary | ICD-10-CM

## 2015-05-25 DIAGNOSIS — Z87891 Personal history of nicotine dependence: Secondary | ICD-10-CM

## 2015-05-25 LAB — COMPREHENSIVE METABOLIC PANEL
ALBUMIN: 4.1 g/dL (ref 3.5–5.2)
ALT: 20 U/L (ref 0–35)
AST: 20 U/L (ref 0–37)
Alkaline Phosphatase: 90 U/L (ref 39–117)
BUN: 15 mg/dL (ref 6–23)
CALCIUM: 9.2 mg/dL (ref 8.4–10.5)
CO2: 23 meq/L (ref 19–32)
CREATININE: 1.26 mg/dL — AB (ref 0.40–1.20)
Chloride: 109 mEq/L (ref 96–112)
GFR: 44.88 mL/min — AB (ref >60.00)
Glucose, Bld: 96 mg/dL (ref 70–99)
Potassium: 4 mEq/L (ref 3.5–5.1)
Sodium: 140 mEq/L (ref 135–145)
Total Bilirubin: 0.3 mg/dL (ref 0.2–1.2)
Total Protein: 6.6 g/dL (ref 6.0–8.3)

## 2015-05-25 LAB — CBC
HCT: 36.9 % (ref 36.0–46.0)
Hemoglobin: 12.2 g/dL (ref 12.0–15.0)
MCHC: 33.1 g/dL (ref 30.0–36.0)
MCV: 89 fl (ref 78.0–100.0)
Platelets: 203 10*3/uL (ref 150.0–400.0)
RBC: 4.15 Mil/uL (ref 3.87–5.11)
RDW: 15.4 % (ref 11.5–15.5)
WBC: 6 10*3/uL (ref 4.0–10.5)

## 2015-05-25 LAB — URIC ACID: Uric Acid, Serum: 5.6 mg/dL (ref 2.4–7.0)

## 2015-05-25 LAB — TSH: TSH: 2.09 u[IU]/mL (ref 0.35–4.50)

## 2015-05-25 LAB — LIPID PANEL
CHOL/HDL RATIO: 2
Cholesterol: 150 mg/dL (ref 0–200)
HDL: 65.4 mg/dL (ref >39.00)
LDL Cholesterol: 68 mg/dL (ref 0–99)
NonHDL: 84.6
Triglycerides: 84 mg/dL (ref 0.0–149.0)
VLDL: 16.8 mg/dL (ref 0.0–40.0)

## 2015-05-31 ENCOUNTER — Telehealth: Payer: Self-pay | Admitting: Acute Care

## 2015-05-31 ENCOUNTER — Ambulatory Visit (INDEPENDENT_AMBULATORY_CARE_PROVIDER_SITE_OTHER): Payer: Commercial Managed Care - HMO | Admitting: Family Medicine

## 2015-05-31 ENCOUNTER — Encounter: Payer: Self-pay | Admitting: Family Medicine

## 2015-05-31 VITALS — BP 108/64 | HR 72 | Temp 98.0°F | Ht 67.0 in | Wt 234.0 lb

## 2015-05-31 DIAGNOSIS — Z87891 Personal history of nicotine dependence: Secondary | ICD-10-CM | POA: Diagnosis not present

## 2015-05-31 DIAGNOSIS — M109 Gout, unspecified: Secondary | ICD-10-CM | POA: Diagnosis not present

## 2015-05-31 DIAGNOSIS — E785 Hyperlipidemia, unspecified: Secondary | ICD-10-CM | POA: Diagnosis not present

## 2015-05-31 DIAGNOSIS — Z0000 Encounter for general adult medical examination without abnormal findings: Secondary | ICD-10-CM | POA: Diagnosis not present

## 2015-05-31 DIAGNOSIS — Z905 Acquired absence of kidney: Secondary | ICD-10-CM

## 2015-05-31 NOTE — Assessment & Plan Note (Signed)
S: no recent gout flares, controlled. Uric acid 5.6 A/P: controlled, continue allopurinol 300mg  BID

## 2015-05-31 NOTE — Patient Instructions (Addendum)
We will call you within a week about your referral to lung cancer screening program. If you do not hear within 2 weeks, give [name] a call. I would love to hear feedback on how this goes for you when I see you back.   Up to date on health maintenance. If you get a cut/scrape you will needed Tetanus shot but medicare doesn't covere otherwise.   Great job with exercise. Weight continues to come down.   You can reference this sheet but if your daughter needs a primary care doctor in [place], let me know and I will work to get her in so we can place a hospice referral

## 2015-05-31 NOTE — Progress Notes (Signed)
Garret Reddish, MD Phone: [phone]  Subjective:  Patient presents today for their annual physical. Chief complaint-noted.   Very tough situation going through right now- daughter who she did not have a relationship with for many years and now does again (she had placed her for adoption at age 69 when she gave birth) informed her on her birthday that she will be going to hospice at age 70. Very tough stressor   Despite many things not going her way, patient continues to try to lose weight. Feels stronger, healthier and overall better after working with personal trainer  ROS- full  review of systems was completed and negative Pertinent negative- no chest pain, shortness of breath, hot swollen joints, myalgias  The following were reviewed and entered/updated in epic: Past Medical History  Diagnosis Date  . GERD (gastroesophageal reflux disease)   . Proteinuria     H/O  . Hx of chest pain     "anxiety"  . Hyperlipidemia   . Tobacco abuse     quit smoking 08/2013  . Complication of anesthesia   . Arthritis     gout  . Anxiety     years ago - no longer an issue  . Low blood sugar     eats several smaller meals a day  . INSOMNIA 10/14/2008    in past  . Palpitations     Stress related   . Cataracts, bilateral    Patient Active Problem List   Diagnosis Date Noted  . S/p nephrectomy 03/12/2015    Priority: High  . Gout 03/12/2015    Priority: Medium  . Hyperlipidemia 03/12/2015    Priority: Medium  . COPD (chronic obstructive pulmonary disease) 03/12/2015    Priority: Medium  . Former smoker     Priority: Medium  . MICROALBUMINURIA 01/23/2008    Priority: Medium  . Tinnitus 03/12/2015    Priority: Low  . History of colonic polyps 03/12/2015    Priority: Low  . Bimalleolar fracture 07/02/2014    Priority: Low  . Arthritis     Priority: Low  . GERD 01/23/2008    Priority: Low  . Microscopic hematuria 01/23/2008    Priority: Low  . UTI (urinary tract infection)  01/08/2008    Priority: Low   Past Surgical History  Procedure Laterality Date  . Other surgical history      hysterectomy, R ovary remains  . Cholecystectomy  2001  . Shoulder surgery      bone spurs left  . Nephrectomy  1992    complex cystic mass - nephrectomy right  . Childbirth      x4  . Eye surgery Bilateral 2015    cataract surgery with lens implant  . Orif ankle fracture Right 07/02/2014    Procedure: OPEN REDUCTION INTERNAL FIXATION (ORIF) RIGHT ANKLE BIMALLOELAR FRACTURE;  Surgeon: Wylene Simmer, MD;  Location: [facility];  Service: Orthopedics;  Laterality: Right;  . Appendectomy      with hysterectomy    Family History  Problem Relation Age of Onset  . Stroke Father   . Alzheimer's disease Father     early, states also parkinsons. died 86  . Hypertension Father   . Ovarian cancer Mother     Dr. Jana Hakim   . Arthritis Mother   . Colon cancer Paternal Grandmother   . Liver disease Daughter     On hospice age 41    Medications- reviewed and updated Current Outpatient Prescriptions  Medication Sig Dispense Refill  .  allopurinol (ZYLOPRIM) 300 MG tablet Take 300 mg by mouth 2 (two) times daily.    [name] aspirin 81 MG tablet Take 81 mg by mouth daily.    [name] esomeprazole (NEXIUM 24HR) 20 MG capsule Take 20 mg by mouth every morning.    [name] lisinopril (PRINIVIL,ZESTRIL) 2.5 MG tablet Take 2.5 mg by mouth daily.    [name] nystatin cream (MYCOSTATIN) APP EXT AA BID FOR 2 WKS  0  . OVER THE COUNTER MEDICATION Cherry extract bid    . OVER THE COUNTER MEDICATION B-12 one daily    . OVER THE COUNTER MEDICATION Olive leaf extract    . OVER THE COUNTER MEDICATION Vitamin D    . Simvastatin (ZOCOR PO) Take 1 tablet by mouth daily.     [name] trimethoprim-polymyxin b (POLYTRIM) ophthalmic solution     . diazepam (VALIUM) 10 MG tablet Take 1 tablet (10 mg total) by mouth every 12 (twelve) hours as needed (muscle spasms of neck). (Patient not taking: Reported on 05/12/2015) 30 tablet 0   No current  facility-administered medications for this visit.    Allergies-reviewed and updated Allergies  Allergen Reactions  . Epinephrine Palpitations  . Codeine Nausea Only  . Penicillins Nausea Only    History   Social History  . Marital Status: Single    Spouse Name: N/A  . Number of Children: N/A  . Years of Education: N/A   Social History Main Topics  . Smoking status: Former Smoker -- 1.00 packs/day for 50 years    Types: Cigarettes    Quit date: 09/01/2013  . Smokeless tobacco: Never Used  . Alcohol Use: 0.6 oz/week    1 Shots of liquor per week  . Drug Use: No  . Sexual Activity: Not on file   Other Topics Concern  . None   Social History Narrative   Divorced. Found love of her life recently in 2016 ([place]). 3 sons. 5 grandkids.    Daughter (had at age 6 and was adopted). To care for [name] her granddaughter when daughter passes- daughter on hospice.       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 Oropharynx normal Had gyn and breast exam through gyn and declines CV: RRR no murmurs rubs or gallops Lungs: CTAB no crackles, wheeze, rhonchi Abdomen: soft/nontender/nondistended/normal bowel sounds. No rebound or guarding.  Ext: trace edema R ankle (prior injury) Skin: warm, dry Neuro: grossly normal, moves all extremities, PERRLA   Assessment/Plan:  69 y.o. female presenting for annual physical.  Health Maintenance counseling: 1. Anticipatory guidance: Patient counseled regarding regular dental exams, wearing seatbelts, wearing sunscreen. Declines skin exam-had one a  year ago.  2. Risk factor reduction:  Advised patient of need for regular exercise (personal training twice a week at [facility], walking 30 minutes to an hour in addition) and diet rich and fruits and vegetables to reduce risk of heart attack and stroke.  3. Immunizations/screenings/ancillary studies- advised Td if has cut/scrape 4. Saw OB/gyn 03/25/15 5.  Bone density was done in 2001- reportedly  normal 6. Mammogram 03/2015 7. No longer needs pap smears but getting through ob/gyn reportedly 8. Colonoscopy done recently. 5 year follow up for adenoma.   Declines urine- had last fall with urology and normal  Former smoker Lung cancer screening referral. 50 pack years quit 2014.   Gout S: no recent gout flares, controlled. Uric acid 5.6 A/P: controlled, continue allopurinol 300mg  BID   Hyperlipidemia S: controlled on simvastatin 20mg  Lab Results  Component Value Date   CHOL 150 05/25/2015   HDL 65.40 05/25/2015   LDLCALC 68 05/25/2015   TRIG 84.0 05/25/2015   CHOLHDL 2 05/25/2015  A/P: continue current rx   S/p nephrectomy Stable Cr 1.1-1.3.  6 months f/u.   Orders Placed This Encounter  Procedures  . Ambulatory Referral for Lung Cancer Scre    Referral Priority:  Routine    Referral Type:  Consultation    Referral Reason:  Specialty Services Required    Number of Visits Requested:  1

## 2015-05-31 NOTE — Telephone Encounter (Signed)
I have scheduled Angie Velasquez for her Shared Decision making visit and low- dose CT scan. She is scheduled for July 6th at 10 am , and 11 am respectively. She verbalized understanding of the time and location of both appointments. She has my contact information in the event she needs to get in touch with me, or has any questions.

## 2015-05-31 NOTE — Assessment & Plan Note (Signed)
Stable Cr 1.1-1.3.

## 2015-05-31 NOTE — Assessment & Plan Note (Signed)
Lung cancer screening referral. 50 pack years quit 2014.

## 2015-05-31 NOTE — Assessment & Plan Note (Signed)
S: controlled on simvastatin 20mg  Lab Results  Component Value Date   CHOL 150 05/25/2015   HDL 65.40 05/25/2015   LDLCALC 68 05/25/2015   TRIG 84.0 05/25/2015   CHOLHDL 2 05/25/2015  A/P: continue current rx

## 2015-06-01 ENCOUNTER — Other Ambulatory Visit: Payer: Self-pay | Admitting: Acute Care

## 2015-06-01 DIAGNOSIS — Z87891 Personal history of nicotine dependence: Secondary | ICD-10-CM

## 2015-06-09 ENCOUNTER — Encounter: Payer: Self-pay | Admitting: Acute Care

## 2015-06-09 ENCOUNTER — Ambulatory Visit (INDEPENDENT_AMBULATORY_CARE_PROVIDER_SITE_OTHER): Payer: Commercial Managed Care - HMO | Admitting: Acute Care

## 2015-06-09 ENCOUNTER — Ambulatory Visit: Admission: RE | Admit: 2015-06-09 | Payer: Commercial Managed Care - HMO | Source: Ambulatory Visit

## 2015-06-09 DIAGNOSIS — Z87891 Personal history of nicotine dependence: Secondary | ICD-10-CM | POA: Diagnosis not present

## 2015-06-09 NOTE — Progress Notes (Signed)
Shared Decision Making Visit Lung Cancer Screening Program [phone])   Eligibility:  Age 69 y.o.  Pack Years Smoking History Calculation 35 (# packs/per year x # years smoked)  Recent History of coughing up blood  no  Unexplained weight loss? no ( >Than 15 pounds within the last 6 months )  Prior History Lung / other cancer no (Diagnosis within the last 5 years already requiring surveillance chest CT Scans).  Smoking Status Former Smoker  Former Smokers: Years since quit:2  Quit Date: 09/01/2013  Visit Components:  Discussion included one or more decision making aids. yes  Discussion included risk/benefits of screening. yes  Discussion included potential follow up diagnostic testing for abnormal scans. yes  Discussion included meaning and risk of over diagnosis. yes  Discussion included meaning and risk of False Positives. yes  Discussion included meaning of total radiation exposure. yes  Counseling Included:  Importance of adherence to annual lung cancer LDCT screening. yes  Impact of comorbidities on ability to participate in the program. yes  Ability and willingness to under diagnostic treatment. yes  Smoking Cessation Counseling:  Current Smokers:   Discussed importance of smoking cessation. NA  Information about tobacco cessation classes and interventions provided to patient. NA  Patient provided with "ticket" for LDCT Scan. yes  Symptomatic Patient. no  Counseling  Diagnosis Code: Tobacco Use Z72.0  Asymptomatic Patient  yes  Counseling: Former Smoker  Former Smokers:   Discussed the importance of maintaining cigarette abstinence. yes  Diagnosis Code: Personal History of Nicotine Dependence. G90.211  Information about tobacco cessation classes and interventions provided to patient. NA  Patient provided with "ticket" for LDCT Scan. yes  Written Order for Lung Cancer Screening with LDCT placed in Epic. Yes (CT Chest Lung Cancer Screening Low  Dose W/O CM) [id number] Z12.2-Screening of respiratory organs Z87.891-Personal history of nicotine dependence  I spent 15 minutes of face to face time with Ms. Dinius explaining the lung cancer screening program. We discussed the above noted  items in regard to the risk/ benefits of the program, and the fact that this is an annual screening requiring patients to commit to screening each year. Ms. Guallpa is a former smoker.She was encouraged to remain smoke free. She verbalized no additional questions that needed addressing. I gave her a copy of the power point we viewed, and my card and contact information as a take away from the appointment to refer to as needed.She was discouraged that she had to pay a co-pay for this one time visit required by Medicare. I will ask the staff here to address this issue. Varying plans, even within Medicare, have different requirements for co pay amounts. She is going to talk with her husband about the program, and call me to schedule a scan. I will follow up with her in the event I do not hear back from her to get the scan scheduled.  Magdalen Spatz, NP

## 2015-06-22 DIAGNOSIS — R87622 Low grade squamous intraepithelial lesion on cytologic smear of vagina (LGSIL): Secondary | ICD-10-CM | POA: Diagnosis not present

## 2015-06-23 ENCOUNTER — Telehealth: Payer: Self-pay | Admitting: Family Medicine

## 2015-06-23 NOTE — Telephone Encounter (Signed)
FYI Pt is doing hot compress with epsom salt and  taking an OTC boil medicine and she wants you to send in something called "igthamal" to pull the rest of the boil out. I advised pt she may need an OV so you can evaluate the area before Rx something.

## 2015-06-23 NOTE — Telephone Encounter (Signed)
Patient Name: Angie Velasquez DOB: [birth year] Initial Comment Caller states has a boil, asking what she can do for it. Nurse Assessment Guidelines Guideline Title Affirmed Question Affirmed Notes Final Disposition User FINAL ATTEMPT MADE - no message left Marcelline Deist, RN, Kermit Balo Comments Will verify contact # for caller with patient coordinator as there is a fast busy signal when attempting to reach. Call reviewed- the number is correct- nurse notified.

## 2015-06-23 NOTE — Telephone Encounter (Signed)
Not sure what team health needs from [name]. She can try warm compresses but also is welcome to come in for visit for evaluation.

## 2015-06-24 NOTE — Telephone Encounter (Signed)
I typically treat with drainage and/or antibiotics. She would need an office visit to evaluate need. I am not sure of other product she is talking about.

## 2015-06-24 NOTE — Telephone Encounter (Signed)
Pt states the boil seems to be better this morning and if it flares back up she will make an OV.

## 2015-06-29 ENCOUNTER — Encounter: Payer: Self-pay | Admitting: Family Medicine

## 2015-06-29 ENCOUNTER — Ambulatory Visit (INDEPENDENT_AMBULATORY_CARE_PROVIDER_SITE_OTHER): Payer: Commercial Managed Care - HMO | Admitting: Family Medicine

## 2015-06-29 VITALS — BP 118/76 | HR 79 | Temp 98.1°F | Wt 235.0 lb

## 2015-06-29 DIAGNOSIS — R21 Rash and other nonspecific skin eruption: Secondary | ICD-10-CM | POA: Diagnosis not present

## 2015-06-29 DIAGNOSIS — L309 Dermatitis, unspecified: Secondary | ICD-10-CM

## 2015-06-29 MED ORDER — TRIAMCINOLONE ACETONIDE 0.1 % EX CREA: 1.0000 "application " | TOPICAL_CREAM | Freq: Two times a day (BID) | CUTANEOUS | Status: DC

## 2015-06-29 NOTE — Patient Instructions (Signed)
Triamcinolone given for eczema

## 2015-06-29 NOTE — Progress Notes (Signed)
Garret Reddish, MD  Subjective:  Angie Velasquez is a 69 y.o. year old very pleasant female patient who presents with:  Rash/eczema Known eczema normally cared for by dermatology. Always occurs on ankles since she was 69 years old. Most recent episode occurring for about a week. Off and on issue. Uses triamcinolone prn and usually helps but dermatologist would not fill without visit and in W-s. symptoms stable not worsening. Mild itching.  ROS-not ill appearing, no fever/chills. No new medications. Not immunocompromised. No mucus membrane involvement.   Past Medical History- s/p nephrectomy, COPD, gout, HLD  Medications- reviewed and updated Current Outpatient Prescriptions  Medication Sig Dispense Refill  . allopurinol (ZYLOPRIM) 300 MG tablet Take 300 mg by mouth 2 (two) times daily.    [name] aspirin 81 MG tablet Take 81 mg by mouth daily.    [name] esomeprazole (NEXIUM 24HR) 20 MG capsule Take 20 mg by mouth every morning.    [name] lisinopril (PRINIVIL,ZESTRIL) 2.5 MG tablet Take 2.5 mg by mouth daily.    [name] nystatin cream (MYCOSTATIN) APP EXT AA BID FOR 2 WKS  0  . OVER THE COUNTER MEDICATION Cherry extract bid    . OVER THE COUNTER MEDICATION B-12 one daily    . OVER THE COUNTER MEDICATION Olive leaf extract    . OVER THE COUNTER MEDICATION Vitamin D    . Simvastatin (ZOCOR PO) Take 1 tablet by mouth daily.     [name] trimethoprim-polymyxin b (POLYTRIM) ophthalmic solution     . diazepam (VALIUM) 10 MG tablet Take 1 tablet (10 mg total) by mouth every 12 (twelve) hours as needed (muscle spasms of neck). (Patient not taking: Reported on 05/12/2015) 30 tablet 0   Objective: BP 118/76 mmHg  Pulse 79  Temp(Src) 98.1 F (36.7 C)  Wt 235 lb (106.595 kg) Gen: NAD, resting comfortably CV: RRR no murmurs rubs or gallops Lungs: CTAB no crackles, wheeze, rhonchi Abdomen: soft/nontender/nondistended/normal bowel sounds. No rebound or guarding.  Ext: no edema Skin: warm, dry Neuro: grossly normal,  moves all extremities  Assessment/Plan:  Rash with eczema as likely cause Treat with triamcinolone. Return to care if does not improve. Advised emmollient regular use as well.   Meds ordered this encounter  Medications  . triamcinolone cream (KENALOG) 0.1 %    Sig: Apply 1 application topically 2 (two) times daily. For 7-10 days for recurring eczema issues    Dispense:  80 g    Refill:  1

## 2015-06-30 ENCOUNTER — Telehealth: Payer: Self-pay | Admitting: Acute Care

## 2015-06-30 NOTE — Telephone Encounter (Signed)
I called to check with this patient, who had wanted to wait to schedule her CT scan until after she had spent some time thinking about. ( She did have a shared decision making visit with me on 06/09/15). I reached her today, and she was in the middle of something. She has asked that I call her again tomorrow, 07/01/15, to talk. I will call again tomorrow to see if she is ready to schedule her scan.

## 2015-07-01 ENCOUNTER — Telehealth: Payer: Self-pay | Admitting: Acute Care

## 2015-07-01 NOTE — Telephone Encounter (Signed)
I called Angie Velasquez back today at her request. There was no answer. I left her a message requesting that she return my call so that we can talk about the low dose CT, and determine if she feels it is right for her. I left my contact information. I will await her return call.

## 2015-07-23 ENCOUNTER — Telehealth: Payer: Self-pay | Admitting: Acute Care

## 2015-07-23 NOTE — Telephone Encounter (Signed)
I have called and left another message for Ms. Tweed, with my contact information asking her to call me back and schedule. This is the 4th call to Ms. Lehman Prom.If I do not hear back from her I will let Dr. Yong Channel know, and assume that the patient is not interested in the program.

## 2015-08-24 ENCOUNTER — Ambulatory Visit (INDEPENDENT_AMBULATORY_CARE_PROVIDER_SITE_OTHER): Payer: Commercial Managed Care - HMO | Admitting: Family Medicine

## 2015-08-24 ENCOUNTER — Encounter: Payer: Self-pay | Admitting: Family Medicine

## 2015-08-24 VITALS — BP 118/64 | Temp 97.8°F | Wt 230.0 lb

## 2015-08-24 DIAGNOSIS — Z23 Encounter for immunization: Secondary | ICD-10-CM

## 2015-08-24 DIAGNOSIS — L720 Epidermal cyst: Secondary | ICD-10-CM | POA: Diagnosis not present

## 2015-08-24 NOTE — Progress Notes (Signed)
Garret Reddish, MD  Subjective:  Angie Velasquez is a 69 y.o. year old very pleasant female patient who presents with:  Skin lesion -Small firm area with black pit in the middle was noted within last few weeks.Not growing in size. No treatment tried. Denies having anything like this before.  ROS- No pain and no itching. No discharge or redness around the lesion.  Past Medical History-  Patient Active Problem List   Diagnosis Date Noted  . S/p nephrectomy 03/12/2015    Priority: High  . Gout 03/12/2015    Priority: Medium  . Hyperlipidemia 03/12/2015    Priority: Medium  . COPD (chronic obstructive pulmonary disease) 03/12/2015    Priority: Medium  . Former smoker     Priority: Medium  . MICROALBUMINURIA 01/23/2008    Priority: Medium  . Eczema 06/29/2015    Priority: Low  . Tinnitus 03/12/2015    Priority: Low  . History of colonic polyps 03/12/2015    Priority: Low  . Bimalleolar fracture 07/02/2014    Priority: Low  . Arthritis     Priority: Low  . GERD 01/23/2008    Priority: Low  . Microscopic hematuria 01/23/2008    Priority: Low  . UTI (urinary tract infection) 01/08/2008    Priority: Low    Medications- reviewed and updated Current Outpatient Prescriptions  Medication Sig Dispense Refill  . allopurinol (ZYLOPRIM) 300 MG tablet Take 300 mg by mouth 2 (two) times daily.    [name] aspirin 81 MG tablet Take 81 mg by mouth daily.    [name] esomeprazole (NEXIUM 24HR) 20 MG capsule Take 20 mg by mouth every morning.    [name] lisinopril (PRINIVIL,ZESTRIL) 2.5 MG tablet Take 2.5 mg by mouth daily.    [name] OVER THE COUNTER MEDICATION Cherry extract bid    . OVER THE COUNTER MEDICATION B-12 one daily    . OVER THE COUNTER MEDICATION Olive leaf extract    . OVER THE COUNTER MEDICATION Vitamin D    . Simvastatin (ZOCOR PO) Take 1 tablet by mouth daily.     [name] triamcinolone cream (KENALOG) 0.1 % Apply 1 application topically 2 (two) times daily. For 7-10 days for recurring eczema  issues 80 g 1  . trimethoprim-polymyxin b (POLYTRIM) ophthalmic solution     . diazepam (VALIUM) 10 MG tablet Take 1 tablet (10 mg total) by mouth every 12 (twelve) hours as needed (muscle spasms of neck). (Patient not taking: Reported on 05/12/2015) 30 tablet 0  . nystatin cream (MYCOSTATIN) APP EXT AA BID FOR 2 WKS  0   No current facility-administered medications for this visit.    Objective: BP 118/64 mmHg  Temp(Src) 97.8 F (36.6 C)  Wt 230 lb (104.327 kg) Gen: NAD, resting comfortably CV: RRR no murmurs rubs or gallops Lungs: CTAB no crackles, wheeze, rhonchi Abdomen: soft/nontender/nondistended/normal bowel sounds.   Ext: no edema Skin: warm, dry On right upper back is a 1 x 1 cm firm lesion with a dark keratin plug in the middle is noted. Mobile. Doubt the depth is very deep  Assessment/Plan:  Epidermoid/sebaceous cyst Hopeful we can remove this was less than 1 cm linear incision. We will have patient return for procedure visit for this. Suspect this is benign. Unlikely to send for pathology. Scheduled for September 28. Does not appear infected  Orders Placed This Encounter  Procedures  . Flu Vaccine QUAD 36+ mos IM

## 2015-08-24 NOTE — Patient Instructions (Addendum)
Received flu shot today.  Return for procedure visit is likely epidermoid cyst

## 2015-09-01 ENCOUNTER — Ambulatory Visit (INDEPENDENT_AMBULATORY_CARE_PROVIDER_SITE_OTHER): Payer: Commercial Managed Care - HMO | Admitting: Family Medicine

## 2015-09-01 ENCOUNTER — Encounter: Payer: Self-pay | Admitting: Family Medicine

## 2015-09-01 VITALS — BP 118/70 | HR 85 | Temp 98.2°F | Wt 228.0 lb

## 2015-09-01 DIAGNOSIS — D485 Neoplasm of uncertain behavior of skin: Secondary | ICD-10-CM

## 2015-09-01 DIAGNOSIS — L720 Epidermal cyst: Secondary | ICD-10-CM

## 2015-09-01 NOTE — Progress Notes (Signed)
Skin Biopsy Procedure Note -Right upper back presumed cyst removal but after biopsy determined to be neoplasm of uncertain behavior and sent for biopsy  PRE-OP DIAGNOSIS:  Neoplasm of uncertain behavior- presumed sebaceous cyst POST-OP DIAGNOSIS:  Neoplasm of uncertain behavior Performing Physician: Garret Reddish  PROCEDURE:  Punch (Size 49mm)  The area surrounding the skin lesion was prepared and draped in the usual sterile manner. The lesion was removed in the usual manner by the biopsy method noted above. Plan was for minimal excision techniquie with punch biopsy variant. When punch biopsy was removed- 2 small circular lesions subcentimeter removed- 1 appears to be small dark cyst, the other is lighter in color and circular- also potential cyst. Remaining portion of wound examined and appeared to be normal tissue without other lesions noted. Attempted to express any other contents but none noted in wound.  PROCEDURE: skin biopsy Hemostasis was assured. The patient tolerated the procedure well.  Closure:     4-0 Ethilon  suture x1                     Followup: The patient tolerated the procedure well without complications.  Standard post-procedure care is explained and return precautions are given.  October 10th at 8 am for suture removal

## 2015-09-01 NOTE — Patient Instructions (Addendum)
Come back on the 10th of October for suture removal (day 12- ideal is between 10 and 14 days for the back).   Do not get area wet today Shower after 24 hours but do not soak or scrub area- light water running over it only.  Change dressing once a day. Honestly- after 1st day or two a simple bandaid may be enough  See [name] back if expanding redness around spot (highly doubt given we did this under sterile precautions). May have mild pain- tylenol is ok to take. Lidocaine may wear off within the next hour or so

## 2015-09-10 ENCOUNTER — Encounter: Payer: Self-pay | Admitting: Family Medicine

## 2015-09-10 ENCOUNTER — Ambulatory Visit (INDEPENDENT_AMBULATORY_CARE_PROVIDER_SITE_OTHER): Payer: Commercial Managed Care - HMO | Admitting: Family Medicine

## 2015-09-10 VITALS — BP 118/74 | HR 73 | Temp 98.5°F | Wt 231.0 lb

## 2015-09-10 DIAGNOSIS — R109 Unspecified abdominal pain: Secondary | ICD-10-CM | POA: Diagnosis not present

## 2015-09-10 NOTE — Progress Notes (Signed)
Garret Reddish, MD  Subjective:  Angie Velasquez is a 69 y.o. year old very pleasant female patient who presents with:  Left flank pain started Wednesday afternoon. Sharp pain lasting over 12 hours then went away.  Had an old tramadol which helped some. Took some valium later in day and pain shortly  went away. 10/10 pain. Points to CVA initially but later to left low back. No radiation of pain  ROS- No blood in urine, no odor. No dysuria or change in urination pattern. No leg weakness or fecal or urinary incontinence  Past Medical History-  Patient Active Problem List   Diagnosis Date Noted  . S/p nephrectomy due to complex cystic mass 03/12/2015    Priority: High  . Gout 03/12/2015    Priority: Medium  . Hyperlipidemia 03/12/2015    Priority: Medium  . COPD (chronic obstructive pulmonary disease) ([place]) 03/12/2015    Priority: Medium  . Former smoker     Priority: Medium  . MICROALBUMINURIA 01/23/2008    Priority: Medium  . Eczema 06/29/2015    Priority: Low  . Tinnitus 03/12/2015    Priority: Low  . History of colonic polyps 03/12/2015    Priority: Low  . Bimalleolar fracture 07/02/2014    Priority: Low  . Arthritis     Priority: Low  . GERD 01/23/2008    Priority: Low  . Microscopic hematuria 01/23/2008    Priority: Low  . UTI (urinary tract infection) 01/08/2008    Priority: Low    Medications- reviewed and updated Current Outpatient Prescriptions  Medication Sig Dispense Refill  . allopurinol (ZYLOPRIM) 300 MG tablet Take 300 mg by mouth 2 (two) times daily.    [name] aspirin 81 MG tablet Take 81 mg by mouth daily.    [name] esomeprazole (NEXIUM 24HR) 20 MG capsule Take 20 mg by mouth every morning.    [name] lisinopril (PRINIVIL,ZESTRIL) 2.5 MG tablet Take 2.5 mg by mouth daily.    [name] OVER THE COUNTER MEDICATION Cherry extract bid    . OVER THE COUNTER MEDICATION B-12 one daily    . OVER THE COUNTER MEDICATION Olive leaf extract    . OVER THE COUNTER MEDICATION Vitamin  D    . Simvastatin (ZOCOR PO) Take 1 tablet by mouth daily.     [name] trimethoprim-polymyxin b (POLYTRIM) ophthalmic solution     . diazepam (VALIUM) 10 MG tablet Take 1 tablet (10 mg total) by mouth every 12 (twelve) hours as needed (muscle spasms of neck). (Patient not taking: Reported on 05/12/2015) 30 tablet 0  . nystatin cream (MYCOSTATIN) APP EXT AA BID FOR 2 WKS  0  . triamcinolone cream (KENALOG) 0.1 % Apply 1 application topically 2 (two) times daily. For 7-10 days for recurring eczema issues (Patient not taking: Reported on 09/10/2015) 80 g 1   Objective: BP 118/74 mmHg  Pulse 73  Temp(Src) 98.5 F (36.9 C)  Wt 231 lb (104.781 kg) Gen: NAD, resting comfortably CV: RRR no murmurs rubs or gallops Lungs: CTAB no crackles, wheeze, rhonchi Abdomen: soft/nontender/nondistended/normal bowel sounds. No rebound or guarding.  Ext: no edema Skin: warm, dry Neuro: grossly normal, moves all extremities Back - No CVA tenderness. Normal skin, Spine with normal alignment and no deformity.  No tenderness to vertebral process palpation.  Paraspinous muscles are mildly tender on right but without spasm.   Range of motion is full at neck and lumbar sacral regions. Neuro- no saddle anesthesia, 5/5 strength lower extremities, 2+ reflexes  We also  removed stitch from punch biopsy at day 9 on right upper back from epidermal cyst removal  Assessment/Plan:  Left flank pain Strongly suspect MSK given relief with valium- likely muscle spasm. But 10/10 and with report CVA tenderness concerning for nephrolithiasis. Initially points to CVA area but alter to low back. Highest concern given nephrectomy right is stone on left. Patient agrees if recurs to CT- she declines today that or x-ray but states she will let me know immediately if recurs. Fortunately pain has completely resolved at this point except for some mild pain with palpation left low back.   Strict/immediate Return precautions advised.

## 2015-09-10 NOTE — Patient Instructions (Signed)
i agree with you- most likely muscular given response to valium  On other hand- with only 1 kidney, we really do not want to take any chances. I want you to let me know immediately if this occurs again and we will get CT noncontrast to evaluate. If you are out of the state- go to the ER. You can use the hydrocodone you have if needed for the pain as well as the valium. You declined x-ray or CT today  Removed stitch as well- can cancel your appointment on 11th at front desk

## 2015-09-14 ENCOUNTER — Ambulatory Visit: Payer: Commercial Managed Care - HMO | Admitting: Family Medicine

## 2015-10-20 NOTE — Telephone Encounter (Signed)
Left Message to make Appointment x4 Sent letter to patient.   Dear Angie Velasquez, We have attempted to call you several times to schedule the lung screening Dr. Yong Channel requested you have performed. We have been unable to contact you by phone. Please call the number below at your earliest convenience so that we can get you scheduled for your screening. We look forward to participating in your care.  Thank you,  [facility] [phone]

## 2015-11-30 ENCOUNTER — Ambulatory Visit: Payer: Commercial Managed Care - HMO | Admitting: Family Medicine

## 2015-12-10 ENCOUNTER — Ambulatory Visit: Payer: Commercial Managed Care - HMO | Admitting: Family Medicine

## 2015-12-24 ENCOUNTER — Ambulatory Visit: Payer: Commercial Managed Care - HMO | Admitting: Family Medicine

## 2015-12-29 DIAGNOSIS — Z90710 Acquired absence of both cervix and uterus: Secondary | ICD-10-CM | POA: Diagnosis not present

## 2015-12-29 DIAGNOSIS — R87629 Unspecified abnormal cytological findings in specimens from vagina: Secondary | ICD-10-CM | POA: Diagnosis not present

## 2015-12-29 DIAGNOSIS — Z1272 Encounter for screening for malignant neoplasm of vagina: Secondary | ICD-10-CM | POA: Diagnosis not present

## 2015-12-29 DIAGNOSIS — R350 Frequency of micturition: Secondary | ICD-10-CM | POA: Diagnosis not present

## 2015-12-29 DIAGNOSIS — N952 Postmenopausal atrophic vaginitis: Secondary | ICD-10-CM | POA: Diagnosis not present

## 2016-01-03 ENCOUNTER — Ambulatory Visit: Payer: Commercial Managed Care - HMO | Admitting: Family Medicine

## 2016-01-04 ENCOUNTER — Encounter: Payer: Self-pay | Admitting: Family Medicine

## 2016-01-04 ENCOUNTER — Ambulatory Visit (INDEPENDENT_AMBULATORY_CARE_PROVIDER_SITE_OTHER): Payer: Commercial Managed Care - HMO | Admitting: Family Medicine

## 2016-01-04 VITALS — BP 116/60 | HR 72 | Temp 97.8°F | Wt 237.0 lb

## 2016-01-04 DIAGNOSIS — M109 Gout, unspecified: Secondary | ICD-10-CM | POA: Diagnosis not present

## 2016-01-04 DIAGNOSIS — E785 Hyperlipidemia, unspecified: Secondary | ICD-10-CM | POA: Diagnosis not present

## 2016-01-04 DIAGNOSIS — I10 Essential (primary) hypertension: Secondary | ICD-10-CM

## 2016-01-04 DIAGNOSIS — Z905 Acquired absence of kidney: Secondary | ICD-10-CM

## 2016-01-04 LAB — COMPREHENSIVE METABOLIC PANEL
ALK PHOS: 85 U/L (ref 39–117)
ALT: 17 U/L (ref 0–35)
AST: 18 U/L (ref 0–37)
Albumin: 4.4 g/dL (ref 3.5–5.2)
BILIRUBIN TOTAL: 0.3 mg/dL (ref 0.2–1.2)
BUN: 22 mg/dL (ref 6–23)
CO2: 30 mEq/L (ref 19–32)
CREATININE: 1.2 mg/dL (ref 0.40–1.20)
Calcium: 9.5 mg/dL (ref 8.4–10.5)
Chloride: 103 mEq/L (ref 96–112)
GFR: 47.4 mL/min — ABNORMAL LOW (ref >60.00)
GLUCOSE: 88 mg/dL (ref 70–99)
Potassium: 3.7 mEq/L (ref 3.5–5.1)
SODIUM: 140 meq/L (ref 135–145)
TOTAL PROTEIN: 7 g/dL (ref 6.0–8.3)

## 2016-01-04 LAB — URIC ACID: Uric Acid, Serum: 5.1 mg/dL (ref 2.4–7.0)

## 2016-01-04 LAB — CBC
HCT: 38.4 % (ref 36.0–46.0)
HEMOGLOBIN: 12.6 g/dL (ref 12.0–15.0)
MCHC: 32.7 g/dL (ref 30.0–36.0)
MCV: 89.8 fl (ref 78.0–100.0)
Platelets: 229 10*3/uL (ref 150.0–400.0)
RBC: 4.28 Mil/uL (ref 3.87–5.11)
RDW: 14.8 % (ref 11.5–15.5)
WBC: 8.1 10*3/uL (ref 4.0–10.5)

## 2016-01-04 NOTE — Assessment & Plan Note (Signed)
S: Creatinine has been stable 1.1-1.3 A/P: repeat BMET today. Continue lisinopril 2.5mg 

## 2016-01-04 NOTE — Assessment & Plan Note (Signed)
S: well controlled on simvastatin 20mg . No myalgias.  Lab Results  Component Value Date   CHOL 150 05/25/2015   HDL 65.40 05/25/2015   LDLCALC 68 05/25/2015   TRIG 84.0 05/25/2015   CHOLHDL 2 05/25/2015   A/P: continue current prescription, check directl LDL

## 2016-01-04 NOTE — Assessment & Plan Note (Signed)
S: controlled. On lisinopril 2.5mg   BP Readings from Last 3 Encounters:  01/04/16 116/60  09/10/15 118/74  09/01/15 118/70  A/P:Continue current medications. While primarily for renal protection- lisinopril has also well controlled blood pressure. Difficult to say if would be hypertensive off medication

## 2016-01-04 NOTE — Progress Notes (Signed)
Garret Reddish, MD  Subjective:  Angie Velasquez is a 70 y.o. year old very pleasant female patient who presents for/with See problem oriented charting ROS- No chest pain or shortness of breath. No headache or blurry vision.   Past Medical History-  Patient Active Problem List   Diagnosis Date Noted  . S/p nephrectomy 03/12/2015    Priority: High  . Essential hypertension 01/04/2016    Priority: Medium  . Gout 03/12/2015    Priority: Medium  . Hyperlipidemia 03/12/2015    Priority: Medium  . COPD (chronic obstructive pulmonary disease) ([place]) 03/12/2015    Priority: Medium  . Arthritis     Priority: Medium  . Former smoker     Priority: Medium  . MICROALBUMINURIA 01/23/2008    Priority: Medium  . Eczema 06/29/2015    Priority: Low  . Tinnitus 03/12/2015    Priority: Low  . History of colonic polyps 03/12/2015    Priority: Low  . Bimalleolar fracture 07/02/2014    Priority: Low  . GERD 01/23/2008    Priority: Low  . Microscopic hematuria 01/23/2008    Priority: Low  . UTI (urinary tract infection) 01/08/2008    Priority: Low    Medications- reviewed and updated Current Outpatient Prescriptions  Medication Sig Dispense Refill  . allopurinol (ZYLOPRIM) 300 MG tablet Take 300 mg by mouth 2 (two) times daily.    [name] aspirin 81 MG tablet Take 81 mg by mouth daily.    [name] CALCIUM-MAGNESIUM-ZINC PO Take by mouth.    . esomeprazole (NEXIUM 24HR) 20 MG capsule Take 20 mg by mouth every morning.    [name] lisinopril (PRINIVIL,ZESTRIL) 2.5 MG tablet Take 2.5 mg by mouth daily.    [name] OVER THE COUNTER MEDICATION Cherry extract bid    . OVER THE COUNTER MEDICATION B-12 one daily    . OVER THE COUNTER MEDICATION Olive leaf extract    . OVER THE COUNTER MEDICATION Vitamin D    . Simvastatin (ZOCOR PO) Take 1 tablet by mouth daily.     . diazepam (VALIUM) 10 MG tablet Take 1 tablet (10 mg total) by mouth every 12 (twelve) hours as needed (muscle spasms of neck). (Patient not taking:  Reported on 05/12/2015) 30 tablet 0  . nystatin cream (MYCOSTATIN) Reported on 01/04/2016  0  . triamcinolone cream (KENALOG) 0.1 % Apply 1 application topically 2 (two) times daily. For 7-10 days for recurring eczema issues (Patient not taking: Reported on 09/10/2015) 80 g 1  . trimethoprim-polymyxin b (POLYTRIM) ophthalmic solution Reported on 01/04/2016     No current facility-administered medications for this visit.    Objective: BP 116/60 mmHg  Pulse 72  Temp(Src) 97.8 F (36.6 C)  Wt 237 lb (107.502 kg) Gen: NAD, resting comfortably CV: RRR no murmurs rubs or gallops Lungs: CTAB no crackles, wheeze, rhonchi Abdomen: soft/nontender/nondistended/normal bowel sounds. obese Ext: no edema Skin: warm, dry, no rash Neuro: grossly normal, moves all extremities  Assessment/Plan:  Gout S: declines any recent attacks. Compliant with allopurinol, has colchicine on hand A/P: check uric acid, continue allopruinol   Hyperlipidemia S: well controlled on simvastatin 20mg . No myalgias.  Lab Results  Component Value Date   CHOL 150 05/25/2015   HDL 65.40 05/25/2015   LDLCALC 68 05/25/2015   TRIG 84.0 05/25/2015   CHOLHDL 2 05/25/2015   A/P: continue current prescription, check directl LDL    S/p nephrectomy S: Creatinine has been stable 1.1-1.3 A/P: repeat BMET today. Continue lisinopril 2.5mg   Essential hypertension  S: controlled. On lisinopril 2.5mg   BP Readings from Last 3 Encounters:  01/04/16 116/60  09/10/15 118/74  09/01/15 118/70  A/P:Continue current medications. While primarily for renal protection- lisinopril has also well controlled blood pressure. Difficult to say if would be hypertensive off medication  Cramps in bilateral legs and at times moves into knees. Took calcium/magnesium/zinc. Also taking potassium. None since last Friday. Discussed if potassium high or issues persist despite supplementation- would need to reevaluate   Return in about 6 months (around  07/03/2016) for physical. Return precautions advised.   Orders Placed This Encounter  Procedures  . CBC    [place]  . Comprehensive metabolic panel    [place]  . Uric Acid    Meds ordered this encounter  Medications  . CALCIUM-MAGNESIUM-ZINC PO    Sig: Take by mouth.

## 2016-01-04 NOTE — Patient Instructions (Signed)
No changes in medicine  Update labs before you leave

## 2016-01-04 NOTE — Assessment & Plan Note (Signed)
S: declines any recent attacks. Compliant with allopurinol, has colchicine on hand A/P: check uric acid, continue allopruinol

## 2016-01-27 ENCOUNTER — Telehealth: Payer: Self-pay

## 2016-01-27 ENCOUNTER — Other Ambulatory Visit: Payer: Self-pay

## 2016-01-27 NOTE — Telephone Encounter (Signed)
May fill tramadol 50mg  to be taken q6 hours prn for moderate to severe pain. #90 with 2 refills

## 2016-01-27 NOTE — Telephone Encounter (Signed)
Pt came in today with husband and requested Tramadol be sent to her mail order. I dont see where patient is on/has been on tramadol before. What dose should pt be on?

## 2016-01-28 ENCOUNTER — Other Ambulatory Visit: Payer: Self-pay

## 2016-01-28 MED ORDER — TRAMADOL HCL 50 MG PO TABS: 50.0000 mg | ORAL_TABLET | Freq: Three times a day (TID) | ORAL | Status: DC | PRN

## 2016-01-28 NOTE — Telephone Encounter (Signed)
Rx printed and faxed to [facility].

## 2016-02-07 ENCOUNTER — Ambulatory Visit (INDEPENDENT_AMBULATORY_CARE_PROVIDER_SITE_OTHER): Payer: Commercial Managed Care - HMO | Admitting: Family Medicine

## 2016-02-07 ENCOUNTER — Encounter: Payer: Self-pay | Admitting: Family Medicine

## 2016-02-07 VITALS — BP 100/60 | HR 100 | Temp 99.1°F | Resp 14 | Wt 242.2 lb

## 2016-02-07 DIAGNOSIS — J111 Influenza due to unidentified influenza virus with other respiratory manifestations: Secondary | ICD-10-CM | POA: Diagnosis not present

## 2016-02-07 DIAGNOSIS — Z905 Acquired absence of kidney: Secondary | ICD-10-CM | POA: Diagnosis not present

## 2016-02-07 MED ORDER — OSELTAMIVIR PHOSPHATE 30 MG PO CAPS: 30.0000 mg | ORAL_CAPSULE | Freq: Two times a day (BID) | ORAL | Status: DC

## 2016-02-07 NOTE — Patient Instructions (Signed)
Please take tamiflu as prescribed. If your symptoms worsen, do not improve in 3-4 days, or you develop a fever >101, please return to clinic for further evaluation.  Influenza, Adult Influenza ("the flu") is a viral infection of the respiratory tract. It occurs more often in winter months because people spend more time in close contact with one another. Influenza can make you feel very sick. Influenza easily spreads from person to person (contagious). CAUSES  Influenza is caused by a virus that infects the respiratory tract. You can catch the virus by breathing in droplets from an infected person's cough or sneeze. You can also catch the virus by touching something that was recently contaminated with the virus and then touching your mouth, nose, or eyes. RISKS AND COMPLICATIONS You may be at risk for a more severe case of influenza if you smoke cigarettes, have diabetes, have chronic heart disease (such as heart failure) or lung disease (such as asthma), or if you have a weakened immune system. Elderly people and pregnant women are also at risk for more serious infections. The most common problem of influenza is a lung infection (pneumonia). Sometimes, this problem can require emergency medical care and may be life threatening. SIGNS AND SYMPTOMS  Symptoms typically last 4 to 10 days and may include:  Fever.  Chills.  Headache, body aches, and muscle aches.  Sore throat.  Chest discomfort and cough.  Poor appetite.  Weakness or feeling tired.  Dizziness.  Nausea or vomiting. DIAGNOSIS  Diagnosis of influenza is often made based on your history and a physical exam. A nose or throat swab test can be done to confirm the diagnosis. TREATMENT  In mild cases, influenza goes away on its own. Treatment is directed at relieving symptoms. For more severe cases, your health care provider may prescribe antiviral medicines to shorten the sickness. Antibiotic medicines are not effective because the  infection is caused by a virus, not by bacteria. HOME CARE INSTRUCTIONS  Take medicines only as directed by your health care provider.  Use a cool mist humidifier to make breathing easier.  Get plenty of rest until your temperature returns to normal. This usually takes 3 to 4 days.  Drink enough fluid to keep your urine clear or pale yellow.  Cover yourmouth and nosewhen coughing or sneezing,and wash your handswellto prevent thevirusfrom spreading.  Stay homefromwork orschool untilthe fever is gonefor at least 27full day. PREVENTION  An annual influenza vaccination (flu shot) is the best way to avoid getting influenza. An annual flu shot is now routinely recommended for all adults in the [place] IF:  You experiencechest pain, yourcough worsens,or you producemore mucus.  Youhave nausea,vomiting, ordiarrhea.  Your fever returns or gets worse. SEEK IMMEDIATE MEDICAL CARE IF:  You havetrouble breathing, you become short of breath,or your skin ornails becomebluish.  You have severe painor stiffnessin the neck.  You develop a sudden headache, or pain in the face or ear.  You have nausea or vomiting that you cannot control. MAKE SURE YOU:   Understand these instructions.  Will watch your condition.  Will get help right away if you are not doing well or get worse.   This information is not intended to replace advice given to you by your health care provider. Make sure you discuss any questions you have with your health care provider.   Document Released: 11/17/2000 Document Revised: 12/11/2014 Document Reviewed: 02/19/2012 Elsevier Interactive Patient Education [employer].

## 2016-02-07 NOTE — Progress Notes (Signed)
Pre visit review using our clinic review tool, if applicable. No additional management support is needed unless otherwise documented below in the visit note. 

## 2016-02-07 NOTE — Addendum Note (Signed)
Addended by: Ailene Rud E on: 02/07/2016 12:38 PM   Modules accepted: Orders

## 2016-02-07 NOTE — Progress Notes (Signed)
Subjective:    Patient ID: Angie Velasquez, female    DOB: February 12, 1946, 70 y.o.   MRN: [id number]  HPI  Angie Velasquez is a 70 year old female who presents today with cough that is nonproductive, frontal sinus pressure, rhinitis, post nasal drip, chills, sweats, generalized body aches, and fatigue for 48 hours.  Mucinex DM and 2 ibuprofen tablets this morning at home with limited benefit. Patient states that her sleep was interrupted with generalized body aches. Recent sick contact of exposure to granddaughter with similar symptoms. Former smoker for over 35 years, quit 3 years ago. Pertinent history of COPD  and status post nephrectomy noted today. Patient states that she was informed that she had COPD many years ago, but has no respiratory symptoms at this time even with exercise.  Review of Systems  Constitutional: Positive for fever, chills and fatigue.  HENT: Positive for congestion, postnasal drip, rhinorrhea and sinus pressure.   Respiratory: Positive for cough. Negative for chest tightness, shortness of breath and wheezing.   Cardiovascular: Negative for chest pain and palpitations.  Gastrointestinal: Negative for nausea, vomiting, abdominal pain and diarrhea.  Genitourinary: Negative for dysuria, urgency and frequency.  Musculoskeletal:       Generalized body aches  Skin: Negative for rash.  Neurological: Negative for dizziness, light-headedness and headaches.    Past Medical History  Diagnosis Date  . GERD (gastroesophageal reflux disease)   . Proteinuria     H/O  . Hx of chest pain     "anxiety"  . Hyperlipidemia   . Tobacco abuse     quit smoking 08/2013  . Complication of anesthesia   . Arthritis     gout  . Anxiety     years ago - no longer an issue  . Low blood sugar     eats several smaller meals a day  . INSOMNIA 10/14/2008    in past  . Palpitations     Stress related   . Cataracts, bilateral     Social History   Social History  . Marital  Status: Single    Spouse Name: N/A  . Number of Children: N/A  . Years of Education: N/A   Occupational History  . Not on file.   Social History Main Topics  . Smoking status: Former Smoker -- 1.00 packs/day for 35 years    Types: Cigarettes    Quit date: 09/01/2013  . Smokeless tobacco: Never Used     Comment: Former smoker with no desire to smoke again. Smoking years edited at patients request to include periods when she did not smoke.  [name] Alcohol Use: 0.6 oz/week    1 Shots of liquor per week  . Drug Use: No  . Sexual Activity: Not on file   Other Topics Concern  . Not on file   Social History Narrative   Divorced. Found love of her life recently in 2016 ([place]). 3 sons. 5 grandkids.    Daughter (had at age 74 and was adopted). To care for [name] her granddaughter when daughter passes- daughter on hospice.       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 Other surgical history      hysterectomy, R  ovary remains  . Cholecystectomy  2001  . Shoulder surgery      bone spurs left  . Nephrectomy  1992    complex cystic mass - nephrectomy right  . Childbirth      x4  . Eye surgery Bilateral 2015    cataract surgery with lens implant  . Orif ankle fracture Right 07/02/2014    Procedure: OPEN REDUCTION INTERNAL FIXATION (ORIF) RIGHT ANKLE BIMALLOELAR FRACTURE;  Surgeon: Wylene Simmer, MD;  Location: [place];  Service: Orthopedics;  Laterality: Right;  . Appendectomy      with hysterectomy    Family History  Problem Relation Age of Onset  . Stroke Father   . Alzheimer's disease Father     early, states also parkinsons. died 66  . Hypertension Father   . Ovarian cancer Mother     Dr. Jana Hakim   . Arthritis Mother   . Colon cancer  Paternal Grandmother   . Liver disease Daughter     On hospice age 66    Allergies  Allergen Reactions  . Epinephrine Palpitations  . Codeine Nausea Only  . Penicillins Nausea Only    Current Outpatient Prescriptions on File Prior to Visit  Medication Sig Dispense Refill  . allopurinol (ZYLOPRIM) 300 MG tablet Take 300 mg by mouth 2 (two) times daily.    [name] aspirin 81 MG tablet Take 81 mg by mouth daily.    [name] CALCIUM-MAGNESIUM-ZINC PO Take by mouth.    . diazepam (VALIUM) 10 MG tablet Take 1 tablet (10 mg total) by mouth every 12 (twelve) hours as needed (muscle spasms of neck). 30 tablet 0  . esomeprazole (NEXIUM 24HR) 20 MG capsule Take 20 mg by mouth every morning.    [name] lisinopril (PRINIVIL,ZESTRIL) 2.5 MG tablet Take 2.5 mg by mouth daily.    [name] nystatin cream (MYCOSTATIN) Reported on 01/04/2016  0  . OVER THE COUNTER MEDICATION Cherry extract bid    . OVER THE COUNTER MEDICATION B-12 one daily    . OVER THE COUNTER MEDICATION Olive leaf extract    . OVER THE COUNTER MEDICATION Vitamin D    . Simvastatin (ZOCOR PO) Take 1 tablet by mouth daily.     . traMADol (ULTRAM) 50 MG tablet Take 1 tablet (50 mg total) by mouth every 8 (eight) hours as needed. 90 tablet 2  . triamcinolone cream (KENALOG) 0.1 % Apply 1 application topically 2 (two) times daily. For 7-10 days for recurring eczema issues 80 g 1  . trimethoprim-polymyxin b (POLYTRIM) ophthalmic solution Reported on 01/04/2016     No current facility-administered medications on file prior to visit.    BP 100/60 mmHg  Pulse 100  Temp(Src) 99.1 F (37.3 C) (Oral)  Wt 242 lb 3.2 oz (109.861 kg)      Objective:   Physical Exam  Constitutional: She is oriented to person, place, and time. She appears well-developed and well-nourished.  HENT:  Right Ear: Tympanic membrane normal.  Left Ear: Tympanic membrane normal.  Nose: Rhinorrhea present. Right sinus exhibits frontal sinus tenderness. Left sinus exhibits frontal sinus  tenderness.  Mouth/Throat: No oropharyngeal exudate or posterior oropharyngeal erythema.  Eyes: Pupils are equal, round, and reactive to light.  Cardiovascular: Normal rate and regular rhythm.   Pulmonary/Chest: Effort normal and breath sounds normal. She has no wheezes. She has no rales.  Abdominal: Soft.  Lymphadenopathy:    She has cervical adenopathy.  Neurological: She is alert and oriented to person, place, and time.  Skin: Skin is warm and dry.  Psychiatric: She has a normal mood and affect. Her behavior is normal.      Assessment & Plan:  1. Influenza +influenza screen. She is s/p nephrectomy. CrCL calculated from labs obtained 1 month ago using ideal body weight. CrCL noted at 42. Renal dosing of tamiflu provided to patient.  - oseltamivir (TAMIFLU) 30 MG capsule; Take 1 capsule (30 mg total) by mouth 2 (two) times daily.  Dispense: 10 capsule; Refill: 0 Advised supportive measures of rest, fluids, and RTC if symptoms worsen, do not improve in 3-4 days, or she is unable to stay hydrated.   2. S/p nephrectomy Advised patient to avoid ibuprofen due to renal function and use Tylenol as needed every 6 hours instead of 4 if needed due to renal function.

## 2016-02-25 DIAGNOSIS — L600 Ingrowing nail: Secondary | ICD-10-CM | POA: Diagnosis not present

## 2016-03-14 ENCOUNTER — Ambulatory Visit (INDEPENDENT_AMBULATORY_CARE_PROVIDER_SITE_OTHER): Payer: Commercial Managed Care - HMO | Admitting: Family Medicine

## 2016-03-14 ENCOUNTER — Encounter: Payer: Self-pay | Admitting: Family Medicine

## 2016-03-14 VITALS — BP 118/70 | HR 74 | Temp 98.1°F | Wt 244.0 lb

## 2016-03-14 DIAGNOSIS — L720 Epidermal cyst: Secondary | ICD-10-CM | POA: Diagnosis not present

## 2016-03-14 DIAGNOSIS — L821 Other seborrheic keratosis: Secondary | ICD-10-CM | POA: Diagnosis not present

## 2016-03-14 DIAGNOSIS — D485 Neoplasm of uncertain behavior of skin: Secondary | ICD-10-CM

## 2016-03-14 MED ORDER — LISINOPRIL 2.5 MG PO TABS: 2.5000 mg | ORAL_TABLET | Freq: Every day | ORAL | Status: DC

## 2016-03-14 MED ORDER — CEPHALEXIN 500 MG PO CAPS: 500.0000 mg | ORAL_CAPSULE | Freq: Two times a day (BID) | ORAL | Status: DC

## 2016-03-14 NOTE — Patient Instructions (Signed)
For neck lesion We did a shave biopsy due to the area of color change to rule out cancer or precancer If this comes back as either- may refer you to dermatology for wider excision  For wound care:  Leave bandaid on for 24 hours then wash with just soap and water Reapply bandaid after putting fine layer of triple antibiotic ointment  For area on back We had planned an incision and drainage as area felt like a pocket of pus.  When we got in it was a very superficial cyst We removed as much of the cyst as was visible Possible this will grow back- would refer to dermatology at that point  Since we did an excision instead of an incision and drainage- I want you to take keflex for 7 days to protect against potential bacterial infection.   For wound care: In 24 hours (or if falls off) rinse with soap and water (do not scrub) Redress wound daily with nonadhesive dressing and gauze until wound has fully scabbed over.  Once scabbed over- dress with triple antibiotic and bandaid  For both areas If expanding redness, worsening pain, fever return to see [name] None require me to look at them again unless not healing over next 2-3 weeks

## 2016-03-14 NOTE — Progress Notes (Signed)
Garret Reddish, MD  Subjective:  Angie Velasquez is a 71 y.o. year old very pleasant female patient who presents for/with See problem oriented charting ROS- no fever, chills, nausea, vomiting. No pain on neck- mild pain on back  Past Medical History-  Patient Active Problem List   Diagnosis Date Noted  . S/p nephrectomy 03/12/2015    Priority: High  . Essential hypertension 01/04/2016    Priority: Medium  . Gout 03/12/2015    Priority: Medium  . Hyperlipidemia 03/12/2015    Priority: Medium  . COPD (chronic obstructive pulmonary disease) ([place]) 03/12/2015    Priority: Medium  . Arthritis     Priority: Medium  . Former smoker     Priority: Medium  . MICROALBUMINURIA 01/23/2008    Priority: Medium  . Eczema 06/29/2015    Priority: Low  . Tinnitus 03/12/2015    Priority: Low  . History of colonic polyps 03/12/2015    Priority: Low  . Bimalleolar fracture 07/02/2014    Priority: Low  . GERD 01/23/2008    Priority: Low  . Microscopic hematuria 01/23/2008    Priority: Low  . UTI (urinary tract infection) 01/08/2008    Priority: Low    Medications- reviewed and updated Current Outpatient Prescriptions  Medication Sig Dispense Refill  . allopurinol (ZYLOPRIM) 300 MG tablet Take 300 mg by mouth 2 (two) times daily.    [name] aspirin 81 MG tablet Take 81 mg by mouth daily.    [name] CALCIUM-MAGNESIUM-ZINC PO Take by mouth.    . esomeprazole (NEXIUM 24HR) 20 MG capsule Take 20 mg by mouth every morning.    [name] lisinopril (PRINIVIL,ZESTRIL) 2.5 MG tablet Take 1 tablet (2.5 mg total) by mouth daily. 90 tablet 3  . OVER THE COUNTER MEDICATION Cherry extract bid    . OVER THE COUNTER MEDICATION B-12 one daily    . OVER THE COUNTER MEDICATION Olive leaf extract    . OVER THE COUNTER MEDICATION Vitamin D    . Simvastatin (ZOCOR PO) Take 1 tablet by mouth daily.     [name] triamcinolone cream (KENALOG) 0.1 % Apply 1 application topically 2 (two) times daily. For 7-10 days for recurring eczema  issues 80 g 1  . trimethoprim-polymyxin b (POLYTRIM) ophthalmic solution Reported on 01/04/2016    . diazepam (VALIUM) 10 MG tablet Take 1 tablet (10 mg total) by mouth every 12 (twelve) hours as needed (muscle spasms of neck). (Patient not taking: Reported on 03/14/2016) 30 tablet 0  . nystatin cream (MYCOSTATIN) Reported on 03/14/2016  0  . traMADol (ULTRAM) 50 MG tablet Take 1 tablet (50 mg total) by mouth every 8 (eight) hours as needed. (Patient not taking: Reported on 03/14/2016) 90 tablet 2   No current facility-administered medications for this visit.    Objective: BP 118/70 mmHg  Pulse 74  Temp(Src) 98.1 F (36.7 C)  Wt 244 lb (110.678 kg) Gen: NAD, resting comfortably CV: RRR no murmurs rubs or gallops Lungs: CTAB no crackles, wheeze, rhonchi Ext: no edema Skin: warm, dry On right neck patient has a 2mm raised slightly darker than flesh colored lesion and at lower portion has dark brown discoloration (presumed seborrheic keratosis but with recent color change in small portion of lesion)  Also on right midback patient has a 1 cm slightly raised white area which feels fluctuant next to a small black lesion (presumed to be sebaceous cyst) Neuro: grossly normal, moves all extremities  Assessment/Plan:  Neoplasm of uncertain behavior of skin - Plan:  Dermatology pathology S: Lesion on right neck for prolonged period of time, noted in lower portion a recent change to dark brown discoloration when previously consistent in color A/P: concern is for malignant vs. Premalignant change with recent color change. We discussed referral to derm for excision vs. Shave biopsy in office- patient opted for second. Sent for pathology.   _________________________________________________________________________________ Skin Biopsy Procedure Note   PRE-OP DIAGNOSIS: neoplasm of uncertain behavior of skin POST-OP DIAGNOSIS: Same  PROCEDURE: skin biopsy Verbal: benefits and risks and alternatives  were discussed  PROCEDURE:  x  Shave Biopsy        _  Excisional Biopsy        _  Punch (Size _)  The area surrounding the skin lesion was prepared and draped in the usual sterile manner. The lesion was removed in the usual manner by the biopsy method noted above. Hemostasis was assured with electrocautery. The patient tolerated the procedure well.  Closure:     _  Monsel's for hemostasis              _  suture _                   x  None  Followup: The patient tolerated the procedure well without complications.  Standard post-procedure care is explained and return precautions are given. __________________________________________________________________________________  Concern for abscess- ended up being superficial epidermoid cyst S: Patient with lesion on right upper back in area where prior epidermoid cyst was removed. She has had some mild tenderness and erythema in area. There seems to be a "blackhead" to one side of it. She has not had any drainage from area. She thought the area was filled with pus and needed to be treated.  A/P: Given fluctuance of area- I initially thought this was an abscess. Upon I+D, discovered no purulence but instead a very superficial white likely epidermoid cyst. Sterile gloves were not used as I+D anticipated and as a result wound was not closed. Due to concern for potential infection after removal of cyst contents- patient was placed on antibiotics for coverage of potential bacterial infection. She recently tolerated a course of keflex despite listed penicillin allergy (nausea).  __________________________________________________________________________________ Procedure:  Incision and drainage of abscess intended. Ultimately completed cyst excision Risks, benefits, and alternatives explained and consent obtained. Time out conducted. Surface cleaned with alcohol. 2 cc lidocaine without epinephine infiltrated around abscess. Adequate anesthesia ensured. Area  prepped with betadine and cleansed with alcohol #11 blade used to make a stab incision into abscess. No purulence noted.  A 1cm excision was made also with more depth but still no purluence Instead a white capsule was noted.  This was pulled out of wound in 2 separate portions Pickups used to explore wound with no further capsule noted- appears to be complete excision.  Hemostasis achieved. Pt stable. Aftercare and follow-up advised __________________________________________________________________________________   Return precautions advised.   Meds ordered this encounter  Medications  . lisinopril (PRINIVIL,ZESTRIL) 2.5 MG tablet    Sig: Take 1 tablet (2.5 mg total) by mouth daily.    Dispense:  90 tablet    Refill:  3  . cephALEXin (KEFLEX) 500 MG capsule    Sig: Take 1 capsule (500 mg total) by mouth 2 (two) times daily.    Dispense:  14 capsule    Refill:  0   AVS  For neck lesion We did a shave biopsy due to the area of color change to rule  out cancer or precancer If this comes back as either- may refer you to dermatology for wider excision  For wound care:  Leave bandaid on for 24 hours then wash with just soap and water Reapply bandaid after putting fine layer of triple antibiotic ointment  For area on back We had planned an incision and drainage as area felt like a pocket of pus.  When we got in it was a very superficial cyst We removed as much of the cyst as was visible Possible this will grow back- would refer to dermatology at that point  Since we did an excision instead of an incision and drainage- I want you to take keflex for 7 days to protect against potential bacterial infection.   For wound care: In 24 hours (or if falls off) rinse with soap and water (do not scrub) Redress wound daily with nonadhesive dressing and gauze until wound has fully scabbed over.  Once scabbed over- dress with triple antibiotic and bandaid  For both areas If expanding  redness, worsening pain, fever return to see [name] None require me to look at them again unless not healing over next 2-3 weeks

## 2016-04-10 ENCOUNTER — Telehealth: Payer: Self-pay | Admitting: Family Medicine

## 2016-04-10 MED ORDER — SIMVASTATIN 20 MG PO TABS: 20.0000 mg | ORAL_TABLET | Freq: Every day | ORAL | Status: DC

## 2016-04-10 MED ORDER — ALLOPURINOL 300 MG PO TABS: 300.0000 mg | ORAL_TABLET | Freq: Two times a day (BID) | ORAL | Status: DC

## 2016-04-10 NOTE — Telephone Encounter (Signed)
I was able to refill the allopurinol. Ok to refill simvastatin?

## 2016-04-10 NOTE — Telephone Encounter (Signed)
Medications sent in.

## 2016-04-10 NOTE — Telephone Encounter (Signed)
Pt need new Rx for allopurinol and simvastatin.  Pharm: [facility]

## 2016-04-10 NOTE — Telephone Encounter (Signed)
Yes thanks, cholesterol medicine

## 2016-04-24 ENCOUNTER — Encounter: Payer: Self-pay | Admitting: Family Medicine

## 2016-04-24 ENCOUNTER — Ambulatory Visit (INDEPENDENT_AMBULATORY_CARE_PROVIDER_SITE_OTHER): Payer: Commercial Managed Care - HMO | Admitting: Family Medicine

## 2016-04-24 VITALS — BP 90/62 | HR 69 | Temp 98.0°F | Wt 248.0 lb

## 2016-04-24 DIAGNOSIS — S90851A Superficial foreign body, right foot, initial encounter: Secondary | ICD-10-CM | POA: Diagnosis not present

## 2016-04-24 DIAGNOSIS — Z23 Encounter for immunization: Secondary | ICD-10-CM

## 2016-04-24 DIAGNOSIS — M795 Residual foreign body in soft tissue: Secondary | ICD-10-CM

## 2016-04-24 MED ORDER — TETANUS-DIPHTHERIA TOXOIDS TD 5-2 LFU IM INJ
0.5000 mL | INJECTION | Freq: Once | INTRAMUSCULAR | Status: AC
  Administered 2016-04-24: 0.5 mL via INTRAMUSCULAR

## 2016-04-24 MED ORDER — CEPHALEXIN 500 MG PO CAPS: 500.0000 mg | ORAL_CAPSULE | Freq: Two times a day (BID) | ORAL | Status: DC

## 2016-04-24 MED ORDER — DIAZEPAM 10 MG PO TABS: 5.0000 mg | ORAL_TABLET | Freq: Two times a day (BID) | ORAL | Status: DC | PRN

## 2016-04-24 NOTE — Progress Notes (Signed)
Subjective:  Angie Velasquez is a 70 y.o. year old very pleasant female patient who presents for/with See problem oriented charting ROS- no fever, chills, nausea, vomiting, expanding redness, swelling in foot. .see any ROS included in HPI as well.   Past Medical History-  Patient Active Problem List   Diagnosis Date Noted  . S/p nephrectomy 03/12/2015    Priority: High  . Essential hypertension 01/04/2016    Priority: Medium  . Gout 03/12/2015    Priority: Medium  . Hyperlipidemia 03/12/2015    Priority: Medium  . COPD (chronic obstructive pulmonary disease) ([place]) 03/12/2015    Priority: Medium  . Arthritis     Priority: Medium  . Former smoker     Priority: Medium  . MICROALBUMINURIA 01/23/2008    Priority: Medium  . Eczema 06/29/2015    Priority: Low  . Tinnitus 03/12/2015    Priority: Low  . History of colonic polyps 03/12/2015    Priority: Low  . Bimalleolar fracture 07/02/2014    Priority: Low  . GERD 01/23/2008    Priority: Low  . Microscopic hematuria 01/23/2008    Priority: Low  . UTI (urinary tract infection) 01/08/2008    Priority: Low    Medications- reviewed and updated Current Outpatient Prescriptions  Medication Sig Dispense Refill  . allopurinol (ZYLOPRIM) 300 MG tablet Take 1 tablet (300 mg total) by mouth 2 (two) times daily. 90 tablet 3  . aspirin 81 MG tablet Take 81 mg by mouth daily.    [name] CALCIUM-MAGNESIUM-ZINC PO Take by mouth.    . cephALEXin (KEFLEX) 500 MG capsule Take 1 capsule (500 mg total) by mouth 2 (two) times daily. 14 capsule 0  . diazepam (VALIUM) 10 MG tablet Take 0.5-1 tablets (5-10 mg total) by mouth every 12 (twelve) hours as needed (muscle spasms of neck. do not drive for 8 hours after taking.). 30 tablet 0  . esomeprazole (NEXIUM 24HR) 20 MG capsule Take 20 mg by mouth every morning.    [name] lisinopril (PRINIVIL,ZESTRIL) 2.5 MG tablet Take 1 tablet (2.5 mg total) by mouth daily. 90 tablet 3  . nystatin cream (MYCOSTATIN)  Reported on 03/14/2016  0  . OVER THE COUNTER MEDICATION Cherry extract bid    . OVER THE COUNTER MEDICATION B-12 one daily    . OVER THE COUNTER MEDICATION Olive leaf extract    . OVER THE COUNTER MEDICATION Vitamin D    . simvastatin (ZOCOR) 20 MG tablet Take 1 tablet (20 mg total) by mouth at bedtime. 90 tablet 3  . traMADol (ULTRAM) 50 MG tablet Take 1 tablet (50 mg total) by mouth every 8 (eight) hours as needed. 90 tablet 2  . triamcinolone cream (KENALOG) 0.1 % Apply 1 application topically 2 (two) times daily. For 7-10 days for recurring eczema issues 80 g 1  . trimethoprim-polymyxin b (POLYTRIM) ophthalmic solution Reported on 01/04/2016     No current facility-administered medications for this visit.    Objective: BP 90/62 mmHg  Pulse 69  Temp(Src) 98 F (36.7 C) (Oral)  Wt 248 lb (112.492 kg)  SpO2 98% Gen: NAD, resting comfortably CV: RRR no murmurs rubs or gallops Lungs: CTAB  Ext: no edema Right great toe has a 1 mm dark area (after excision this appears to be very superficial blood blister). Palpation over this area produces moderate pain. Did not explore to depth greater than 5-7 mm maximum Skin: warm, dry, no erythema near area with pain. Neuro: grossly normal, moves all extremities. Intact  distal sensation  Assessment/Plan:  Foreign body in skin S: Glass lid landed on ground- thought they had cleaned everything up and then stepped on a piece of glass- thoughthad gotten everything out . Happened about 10 days ago. Continued pain up to 5/10 worse with walking- shooting pain with each step. Worried about infection. Occurred in right great toe. Continued pain since it happened. Walks with slight limp. No medicines tried. Soaked in water. Has a 1 mm dark area noted in this (may be blood blister) A/P: 1 cm incision under sterile environment cut- explored underlying tissue and there were several blood clots that when crushed had a glass like sound. Patient was very tender  despite several ccs of lidocaine being applied (did not use nerve block). There did feel to be a deeper area that may contain glass that I could not reach and patient was not tolerating from pain perspective on deeper palpation. Stopped procedure and referred to podiatry. Updated tetanus shot. Gave keflex to cover for infection (usually gets nauseous after 4-5 days but will take until that occurs). To monitor for signs/symptoms of infection. Further exploration per stat referral to her podiatrist of choice Dr. Gershon Mussel.   Return precautions advised.   Orders Placed This Encounter  Procedures  . Ambulatory referral to Podiatry    Referral Priority:  Urgent    Referral Type:  Consultation    Referral Reason:  Specialty Services Required    Requested Specialty:  Podiatry    Number of Visits Requested:  1    Meds ordered this encounter  Medications  . diazepam (VALIUM) 10 MG tablet    Sig: Take 0.5-1 tablets (5-10 mg total) by mouth every 12 (twelve) hours as needed (muscle spasms of neck. do not drive for 8 hours after taking.).    Dispense:  30 tablet    Refill:  0  . cephALEXin (KEFLEX) 500 MG capsule    Sig: Take 1 capsule (500 mg total) by mouth 2 (two) times daily.    Dispense:  14 capsule    Refill:  0  . tetanus & diphtheria toxoids (adult) (TENIVAC) injection 0.5 mL    Sig:   new acute issue  The duration of face-to-face time during this visit was 25 minutes. Greater than 50% of this time was spent in counseling, explanation of diagnosis, planning of further management, and/or coordination of care.    Garret Reddish, MD

## 2016-04-24 NOTE — Patient Instructions (Addendum)
May use 1/2 a tab of diazepam sparingingly for anxiety through time of wedding then return to just for muscle spasms  If ongoing issues after the wedding, let's schedule a visit to talk about potential daily medication  Podiatry referral- sit tight in room until Ms. Neoma Laming can process this so you can try to go over today or tomorrow  Cover for infection with keflex  Td shot before you go (out of date for tetanus)

## 2016-06-27 ENCOUNTER — Ambulatory Visit (INDEPENDENT_AMBULATORY_CARE_PROVIDER_SITE_OTHER): Payer: Commercial Managed Care - HMO | Admitting: Family Medicine

## 2016-06-27 VITALS — BP 112/56 | HR 98 | Temp 98.3°F | Ht 67.0 in | Wt 245.0 lb

## 2016-06-27 DIAGNOSIS — J0100 Acute maxillary sinusitis, unspecified: Secondary | ICD-10-CM | POA: Diagnosis not present

## 2016-06-27 MED ORDER — HYDROCODONE-ACETAMINOPHEN 10-325 MG PO TABS: 1.0000 | ORAL_TABLET | Freq: Four times a day (QID) | ORAL | 0 refills | Status: DC | PRN

## 2016-06-27 MED ORDER — AZITHROMYCIN 250 MG PO TABS: ORAL_TABLET | ORAL | 0 refills | Status: DC

## 2016-06-27 NOTE — Progress Notes (Signed)
   Subjective:    Patient ID: Angie Velasquez, female    DOB: [birth year], 70 y.o.   MRN: [id number]  HPI Here for 3 days of pain in the right cheek area, blowing out green mucus from the nose, and PND. She had fever for 24 hours but not now. No coughing. Using Mucinex.    Review of Systems  Constitutional: Negative.   HENT: Positive for congestion, facial swelling, postnasal drip and sinus pressure. Negative for ear pain and sore throat.   Eyes: Negative.   Respiratory: Negative.        Objective:   Physical Exam  Constitutional: She is oriented to person, place, and time. She appears well-developed and well-nourished. No distress.  HENT:  Right Ear: External ear normal.  Left Ear: External ear normal.  Nose: Nose normal.  Mouth/Throat: Oropharynx is clear and moist.  Eyes: Conjunctivae are normal.  Neck: Neck supple. No thyromegaly present.  Pulmonary/Chest: Effort normal and breath sounds normal.  Lymphadenopathy:    She has no cervical adenopathy.  Neurological: She is alert and oriented to person, place, and time.          Assessment & Plan:  Sinusitis, treat with a Zpack.  Laurey Morale, MD

## 2016-06-27 NOTE — Progress Notes (Signed)
Pre visit review using our clinic review tool, if applicable. No additional management support is needed unless otherwise documented below in the visit note. 

## 2016-06-30 DIAGNOSIS — Z1231 Encounter for screening mammogram for malignant neoplasm of breast: Secondary | ICD-10-CM | POA: Diagnosis not present

## 2016-06-30 DIAGNOSIS — Z01419 Encounter for gynecological examination (general) (routine) without abnormal findings: Secondary | ICD-10-CM | POA: Diagnosis not present

## 2016-07-04 ENCOUNTER — Ambulatory Visit: Payer: Commercial Managed Care - HMO | Admitting: Family Medicine

## 2016-07-20 ENCOUNTER — Ambulatory Visit (INDEPENDENT_AMBULATORY_CARE_PROVIDER_SITE_OTHER): Payer: Commercial Managed Care - HMO | Admitting: Family Medicine

## 2016-07-20 ENCOUNTER — Encounter: Payer: Self-pay | Admitting: Family Medicine

## 2016-07-20 VITALS — BP 94/64 | HR 79 | Temp 98.0°F | Ht 67.0 in | Wt 240.0 lb

## 2016-07-20 DIAGNOSIS — K219 Gastro-esophageal reflux disease without esophagitis: Secondary | ICD-10-CM

## 2016-07-20 DIAGNOSIS — J449 Chronic obstructive pulmonary disease, unspecified: Secondary | ICD-10-CM

## 2016-07-20 DIAGNOSIS — I10 Essential (primary) hypertension: Secondary | ICD-10-CM | POA: Diagnosis not present

## 2016-07-20 DIAGNOSIS — M109 Gout, unspecified: Secondary | ICD-10-CM | POA: Diagnosis not present

## 2016-07-20 DIAGNOSIS — F411 Generalized anxiety disorder: Secondary | ICD-10-CM

## 2016-07-20 DIAGNOSIS — E785 Hyperlipidemia, unspecified: Secondary | ICD-10-CM | POA: Diagnosis not present

## 2016-07-20 MED ORDER — CLONAZEPAM 0.5 MG PO TABS: 0.5000 mg | ORAL_TABLET | Freq: Two times a day (BID) | ORAL | 1 refills | Status: DC | PRN

## 2016-07-20 NOTE — Patient Instructions (Addendum)
Only change today is trial of clonazepam for anxiety while stopping diazepam  Labs before you leave  I would also like for you to sign up for an annual wellness visit (a few days to weeks before you see me in 6 months) on a Friday with our nurse Manuela Schwartz. This is a free benefit under medicare that may help [name] find additional ways to help you.

## 2016-07-20 NOTE — Progress Notes (Signed)
Subjective:  Angie Velasquez is a 70 y.o. year old very pleasant female patient who presents for/with See problem oriented charting ROS- No chest pain or shortness of breath. No headache or blurry vision. .see any ROS included in HPI as well. Does admit to anxiety/stress with primarily adult step son  Past Medical History-  Patient Active Problem List   Diagnosis Date Noted  . S/p nephrectomy 03/12/2015    Priority: High  . Essential hypertension 01/04/2016    Priority: Medium  . Gout 03/12/2015    Priority: Medium  . Hyperlipidemia 03/12/2015    Priority: Medium  . COPD (chronic obstructive pulmonary disease) ([place]) 03/12/2015    Priority: Medium  . Arthritis     Priority: Medium  . Former smoker     Priority: Medium  . MICROALBUMINURIA 01/23/2008    Priority: Medium  . Eczema 06/29/2015    Priority: Low  . Tinnitus 03/12/2015    Priority: Low  . History of colonic polyps 03/12/2015    Priority: Low  . Bimalleolar fracture 07/02/2014    Priority: Low  . GERD 01/23/2008    Priority: Low  . Microscopic hematuria 01/23/2008    Priority: Low  . UTI (urinary tract infection) 01/08/2008    Priority: Low  . Anxiety state 07/20/2016    Medications- reviewed and updated Current Outpatient Prescriptions  Medication Sig Dispense Refill  . allopurinol (ZYLOPRIM) 300 MG tablet Take 1 tablet (300 mg total) by mouth 2 (two) times daily. 90 tablet 3  . aspirin 81 MG tablet Take 81 mg by mouth daily.    [name] CALCIUM-MAGNESIUM-ZINC PO Take by mouth.    . diazepam (VALIUM) 10 MG tablet Take 0.5-1 tablets (5-10 mg total) by mouth every 12 (twelve) hours as needed (muscle spasms of neck. do not drive for 8 hours after taking.). 30 tablet 0  . esomeprazole (NEXIUM 24HR) 20 MG capsule Take 20 mg by mouth every morning.    [name] HYDROcodone-acetaminophen (NORCO) 10-325 MG tablet Take 1 tablet by mouth every 6 (six) hours as needed for severe pain. 30 tablet 0  . lisinopril (PRINIVIL,ZESTRIL)  2.5 MG tablet Take 1 tablet (2.5 mg total) by mouth daily. 90 tablet 3  . nystatin cream (MYCOSTATIN) Reported on 03/14/2016  0  . OVER THE COUNTER MEDICATION Cherry extract bid    . OVER THE COUNTER MEDICATION B-12 one daily    . OVER THE COUNTER MEDICATION Olive leaf extract    . OVER THE COUNTER MEDICATION Vitamin D    . simvastatin (ZOCOR) 20 MG tablet Take 1 tablet (20 mg total) by mouth at bedtime. 90 tablet 3  . traMADol (ULTRAM) 50 MG tablet Take 1 tablet (50 mg total) by mouth every 8 (eight) hours as needed. 90 tablet 2  . triamcinolone cream (KENALOG) 0.1 % Apply 1 application topically 2 (two) times daily. For 7-10 days for recurring eczema issues 80 g 1  . trimethoprim-polymyxin b (POLYTRIM) ophthalmic solution Reported on 01/04/2016     Objective: BP 94/64 (BP Location: Left Arm, Patient Position: Sitting, Cuff Size: Large)   Pulse 79   Temp 98 F (36.7 C) (Oral)   Ht 5\' 7"  (1.702 m)   Wt 240 lb (108.9 kg)   SpO2 97%   BMI 37.59 kg/m  Gen: NAD, resting comfortably CV: RRR no murmurs rubs or gallops Lungs: CTAB no crackles, wheeze, rhonchi Abdomen: soft/nontender/nondistended/normal bowel sounds. obese Ext: no edema Skin: warm, dry Neuro: grossly normal, moves all extremities  Assessment/Plan:  GOUT S: compliant with allopurinol, colchicine on hand but hasnt had to use Lab Results  Component Value Date   LABURIC 5.1 01/04/2016  A/P: doing well and uric acid well controlled- continue current meds  Hyperlipidemia S: well controlled on simvastatin 20mg . No myalgias.  Lab Results  Component Value Date   CHOL 150 05/25/2015   HDL 65.40 05/25/2015   LDLCALC 68 05/25/2015   TRIG 84.0 05/25/2015   CHOLHDL 2 05/25/2015   A/P: had intended to update LDL last visit- will do today  Hypertension S/p nephrectomy with GFR baseline in 40s. Done due to complex cystic mass S: controlled on lisinopril 2.5mg  BP Readings from Last 3 Encounters:  07/20/16 94/64  06/27/16  (!) 112/56  04/24/16 90/62  A/P:Continue current meds:  Update GFR/creatinine today  COPD S: diagnosed years ago- denies current symptoms quit smoking 2014.  A/P: continue to monitor for development of symptoms  Former smoker- 50 pack years quit 2014- lung cancer screening - concerned about costs after visit with them  GERD S: if eats after 7 pm- deals with symptoms. Has been taking OTC nexium and is doing well with this.  A/P: continue current medications  Anxiety state S: diazepam in past for muscle spasms. Requested clonazepam for anxiety- discussed would avoid 2 benzos. Agrees to stop diazepam A/P: advised sparing use of clonazepam- stress level high moving in with new husband and his adult son- hopeful with son moving stressor will leave and stress will reduce. Goal would be 3-4 pills max a week in long run.   6 months awv then see me following few weeks. At awv- have susan send me request to update labs. Was already ordering future labs today so needed to be separate orders Orders Placed This Encounter  Procedures  . LDL cholesterol, direct    [place]    Standing Status:   Future    Standing Expiration Date:   07/20/2017  . Basic metabolic panel    [place]    Standing Status:   Future    Standing Expiration Date:   07/20/2017    Meds ordered this encounter  Medications  . clonazePAM (KLONOPIN) 0.5 MG tablet    Sig: Take 1 tablet (0.5 mg total) by mouth 2 (two) times daily as needed for anxiety.    Dispense:  60 tablet    Refill:  1    Fax to [facility]    Return precautions advised.  Garret Reddish, MD

## 2016-07-20 NOTE — Assessment & Plan Note (Signed)
S: if eats after 7 pm- deals with symptoms. Has been taking OTC nexium and is doing well with this.  A/P: continue current medications

## 2016-07-20 NOTE — Assessment & Plan Note (Signed)
S: diazepam in past for muscle spasms. Requested clonazepam for anxiety- discussed would avoid 2 benzos. Agrees to stop diazepam A/P: advised sparing use of clonazepam- stress level high moving in with new husband and his adult son- hopeful with son moving stressor will leave and stress will reduce. Goal would be 3-4 pills max a week in long run.

## 2016-07-20 NOTE — Progress Notes (Signed)
Pre visit review using our clinic review tool, if applicable. No additional management support is needed unless otherwise documented below in the visit note. 

## 2016-07-21 ENCOUNTER — Other Ambulatory Visit: Payer: Commercial Managed Care - HMO

## 2016-08-14 ENCOUNTER — Other Ambulatory Visit: Payer: Self-pay | Admitting: Family Medicine

## 2016-08-22 DIAGNOSIS — M9903 Segmental and somatic dysfunction of lumbar region: Secondary | ICD-10-CM | POA: Diagnosis not present

## 2016-08-22 DIAGNOSIS — M791 Myalgia: Secondary | ICD-10-CM | POA: Diagnosis not present

## 2016-08-22 DIAGNOSIS — R51 Headache: Secondary | ICD-10-CM | POA: Diagnosis not present

## 2016-08-22 DIAGNOSIS — M9902 Segmental and somatic dysfunction of thoracic region: Secondary | ICD-10-CM | POA: Diagnosis not present

## 2016-08-22 DIAGNOSIS — M9905 Segmental and somatic dysfunction of pelvic region: Secondary | ICD-10-CM | POA: Diagnosis not present

## 2016-08-22 DIAGNOSIS — M62830 Muscle spasm of back: Secondary | ICD-10-CM | POA: Diagnosis not present

## 2016-08-22 DIAGNOSIS — M9901 Segmental and somatic dysfunction of cervical region: Secondary | ICD-10-CM | POA: Diagnosis not present

## 2016-08-25 DIAGNOSIS — M9901 Segmental and somatic dysfunction of cervical region: Secondary | ICD-10-CM | POA: Diagnosis not present

## 2016-08-25 DIAGNOSIS — M62830 Muscle spasm of back: Secondary | ICD-10-CM | POA: Diagnosis not present

## 2016-08-25 DIAGNOSIS — M9903 Segmental and somatic dysfunction of lumbar region: Secondary | ICD-10-CM | POA: Diagnosis not present

## 2016-08-25 DIAGNOSIS — M791 Myalgia: Secondary | ICD-10-CM | POA: Diagnosis not present

## 2016-08-25 DIAGNOSIS — M9905 Segmental and somatic dysfunction of pelvic region: Secondary | ICD-10-CM | POA: Diagnosis not present

## 2016-08-25 DIAGNOSIS — M9902 Segmental and somatic dysfunction of thoracic region: Secondary | ICD-10-CM | POA: Diagnosis not present

## 2016-08-25 DIAGNOSIS — R51 Headache: Secondary | ICD-10-CM | POA: Diagnosis not present

## 2016-08-29 DIAGNOSIS — B373 Candidiasis of vulva and vagina: Secondary | ICD-10-CM | POA: Diagnosis not present

## 2016-08-29 DIAGNOSIS — R87619 Unspecified abnormal cytological findings in specimens from cervix uteri: Secondary | ICD-10-CM | POA: Diagnosis not present

## 2016-08-31 DIAGNOSIS — M791 Myalgia: Secondary | ICD-10-CM | POA: Diagnosis not present

## 2016-08-31 DIAGNOSIS — M9905 Segmental and somatic dysfunction of pelvic region: Secondary | ICD-10-CM | POA: Diagnosis not present

## 2016-08-31 DIAGNOSIS — R51 Headache: Secondary | ICD-10-CM | POA: Diagnosis not present

## 2016-08-31 DIAGNOSIS — M62830 Muscle spasm of back: Secondary | ICD-10-CM | POA: Diagnosis not present

## 2016-08-31 DIAGNOSIS — M9903 Segmental and somatic dysfunction of lumbar region: Secondary | ICD-10-CM | POA: Diagnosis not present

## 2016-08-31 DIAGNOSIS — M9902 Segmental and somatic dysfunction of thoracic region: Secondary | ICD-10-CM | POA: Diagnosis not present

## 2016-08-31 DIAGNOSIS — M9901 Segmental and somatic dysfunction of cervical region: Secondary | ICD-10-CM | POA: Diagnosis not present

## 2016-09-19 ENCOUNTER — Telehealth: Payer: Self-pay | Admitting: Family Medicine

## 2016-09-19 NOTE — Telephone Encounter (Signed)
Pt need new Rx for triamcinolone acetonide cream  0.190 and clonazepam     Pharm:  [facility]

## 2016-09-20 ENCOUNTER — Other Ambulatory Visit: Payer: Self-pay

## 2016-09-20 MED ORDER — TRIAMCINOLONE ACETONIDE 0.1 % EX CREA: 1.0000 "application " | TOPICAL_CREAM | Freq: Two times a day (BID) | CUTANEOUS | 1 refills | Status: DC

## 2016-09-20 NOTE — Telephone Encounter (Signed)
Prescription sent to pharmacy as requested.

## 2016-09-29 ENCOUNTER — Ambulatory Visit (INDEPENDENT_AMBULATORY_CARE_PROVIDER_SITE_OTHER): Payer: Commercial Managed Care - HMO

## 2016-09-29 DIAGNOSIS — Z23 Encounter for immunization: Secondary | ICD-10-CM | POA: Diagnosis not present

## 2016-11-15 ENCOUNTER — Other Ambulatory Visit: Payer: Self-pay | Admitting: Family Medicine

## 2016-11-15 MED ORDER — CLONAZEPAM 0.5 MG PO TABS: 0.5000 mg | ORAL_TABLET | Freq: Two times a day (BID) | ORAL | 1 refills | Status: DC | PRN

## 2016-12-19 ENCOUNTER — Encounter: Payer: Self-pay | Admitting: Family Medicine

## 2016-12-19 ENCOUNTER — Ambulatory Visit (INDEPENDENT_AMBULATORY_CARE_PROVIDER_SITE_OTHER): Payer: Medicare HMO | Admitting: Family Medicine

## 2016-12-19 VITALS — BP 118/82 | HR 84 | Temp 97.4°F | Wt 248.2 lb

## 2016-12-19 DIAGNOSIS — J00 Acute nasopharyngitis [common cold]: Secondary | ICD-10-CM | POA: Diagnosis not present

## 2016-12-19 DIAGNOSIS — F329 Major depressive disorder, single episode, unspecified: Secondary | ICD-10-CM

## 2016-12-19 DIAGNOSIS — R300 Dysuria: Secondary | ICD-10-CM | POA: Diagnosis not present

## 2016-12-19 DIAGNOSIS — F32A Depression, unspecified: Secondary | ICD-10-CM | POA: Insufficient documentation

## 2016-12-19 LAB — POCT URINALYSIS DIPSTICK
Bilirubin, UA: NEGATIVE
Glucose, UA: NEGATIVE
Ketones, UA: NEGATIVE
Leukocytes, UA: NEGATIVE
Nitrite, UA: NEGATIVE
Spec Grav, UA: 1.02
Urobilinogen, UA: 0.2
pH, UA: 7

## 2016-12-19 MED ORDER — CEPHALEXIN 500 MG PO CAPS: 500.0000 mg | ORAL_CAPSULE | Freq: Three times a day (TID) | ORAL | 0 refills | Status: AC

## 2016-12-19 MED ORDER — SERTRALINE HCL 50 MG PO TABS: 50.0000 mg | ORAL_TABLET | Freq: Every day | ORAL | 3 refills | Status: DC

## 2016-12-19 NOTE — Patient Instructions (Addendum)
It was my pleasure to see you today!   1. I am culturing your urine. We can assume that you have an infection, based on your symptoms. It is okay for you to start the antibiotic while we wait on the culture.  2. I want you to start Zoloft. Take 1/2 pill at night for 1-2 weeks, then you may increase to 1 pill at night. It will likely take 2-3 weeks to see results. Dr. Yong Channel wants to see you in two weeks.    Briscoe Deutscher, D.O. [facility] [place]

## 2016-12-19 NOTE — Assessment & Plan Note (Signed)
Patient was given information on SSRIs and possible side effects were reviewed. She was asked to contact us with any worsening in symptoms or suicidal thoughts and we discussed that it would take 2-4 weeks to begin to see improvement in her symptoms. Handout provided to call therapists. She will f/u with PCP in 2 weeks.

## 2016-12-19 NOTE — Progress Notes (Signed)
Patient ID: Angie Velasquez, female  DOB: [birth year]  Age: 71 y.o. MRN: [id number]  Angie Velasquez is a 71 y.o. female here for an ESTABLISHED) patient visit to discuss:   History of Present Illness:   1. Urinary frequency, dysuria, foul urine odor x 1 day. She has started drinking cranberry juice. No Hx of frequent UTI. No vaginal discharge, back pain, N/V, or fever.   2. URI, with runny nose, dry cough, chest congestion x 2 days. No Cp, SOB, wheeze, edema, fever. Nonsmoker.   3. Depression, worsening. Hx of anxiety/depression. Reports family stressors, including : husband with depression, dementia, hoarding behaviors. Daughter: disappeared. Son: recent retina detachment, she flew out to assist him. No SI/HI. Takes Klonopin q hs. She has been talking to her minister and a friend. Not exercising. No drug or alcohol use. No Hx of depression medication use.   PMHx, SurgHx, SocialHx, Medications, and Allergies were reviewed in the Visit Navigator and updated as appropriate.  Past Medical History:  Diagnosis Date  . Anxiety    years ago - no longer an issue  . Arthritis    gout  . Cataracts, bilateral   . Complication of anesthesia   . GERD (gastroesophageal reflux disease)   . Hx of chest pain    "anxiety"  . Hyperlipidemia   . INSOMNIA 10/14/2008   in past  . Low blood sugar    eats several smaller meals a day  . Palpitations    Stress related   . Proteinuria    H/O  . Tobacco abuse    quit smoking 08/2013   Past Surgical History:  Procedure Laterality Date  . APPENDECTOMY     with hysterectomy  . childbirth     x4  . CHOLECYSTECTOMY  2001  . EYE SURGERY Bilateral 2015   cataract surgery with lens implant  . NEPHRECTOMY  1992   complex cystic mass - nephrectomy right  . ORIF ANKLE FRACTURE Right 07/02/2014   Procedure: OPEN REDUCTION INTERNAL FIXATION (ORIF) RIGHT ANKLE BIMALLOELAR FRACTURE;  Surgeon: Wylene Simmer, MD;  Location: [place];  Service:  Orthopedics;  Laterality: Right;  . OTHER SURGICAL HISTORY     hysterectomy, R ovary remains  . SHOULDER SURGERY     bone spurs left      Family History  Problem Relation Age of Onset  . Stroke Father   . Alzheimer's disease Father     early, states also parkinsons. died 79  . Hypertension Father   . Ovarian cancer Mother     Dr. Jana Hakim   . Arthritis Mother   . Colon cancer Paternal Grandmother   . Liver disease Daughter     On hospice age 34   Social History  Substance Use Topics  . Smoking status: Former Smoker    Packs/day: 1.00    Years: 35.00    Types: Cigarettes    Quit date: 09/01/2013  . Smokeless tobacco: Never Used     Comment: Former smoker with no desire to smoke again. Smoking years edited at patients request to include periods when she did not smoke.  [name] Alcohol use 0.6 oz/week    1 Shots of liquor per week      Allergies  Allergen Reactions  . Epinephrine Palpitations  . Codeine Nausea Only  . Penicillins Nausea Only    Outpatient Medications Prior to Visit  Medication Sig Dispense Refill  . allopurinol (ZYLOPRIM) 300 MG tablet TAKE 1 TABLET (300 MG TOTAL) 2  TIMES DAILY. 180 tablet 3  . aspirin 81 MG tablet Take 81 mg by mouth daily.    [name] CALCIUM-MAGNESIUM-ZINC PO Take by mouth.    . clonazePAM (KLONOPIN) 0.5 MG tablet Take 1 tablet (0.5 mg total) by mouth 2 (two) times daily as needed for anxiety. 60 tablet 1  . esomeprazole (NEXIUM 24HR) 20 MG capsule Take 20 mg by mouth every morning.    [name] lisinopril (PRINIVIL,ZESTRIL) 2.5 MG tablet Take 1 tablet (2.5 mg total) by mouth daily. 90 tablet 3  . nystatin cream (MYCOSTATIN) Reported on 03/14/2016  0  . OVER THE COUNTER MEDICATION Cherry extract bid    . OVER THE COUNTER MEDICATION B-12 one daily    . OVER THE COUNTER MEDICATION Olive leaf extract    . OVER THE COUNTER MEDICATION Vitamin D    . simvastatin (ZOCOR) 20 MG tablet Take 1 tablet (20 mg total) by mouth at bedtime. 90 tablet 3  .  triamcinolone cream (KENALOG) 0.1 % Apply 1 application topically 2 (two) times daily. For 7-10 days for recurring eczema issues 80 g 1   Review of Systems:  Constitutional: Denies: fever, chills, diaphoresis, weakness, fatigue, weight loss, weight gain.  Cardiovascular: Denies: chest pain, dyspnea on exertion, orthopnea, paroxysmal nocturnal dyspnea, edema, palpitations.  Respiratory: Denies: hemoptysis, shortness of breath, pleuritic chest pain, wheezing.  GI: Denies: abdominal pain, flank pain, nausea, vomiting, diarrhea, constipation, black stool, blood in stool.  GU: Denies: hematuria.  Neuro: Denies: dizzy/vertigo, headache, focal weakness, numbness/tingling, speech problems, loss of consciousness, confusion, memory loss.  Msk: Denies: back pain, joint pain, joint stiffness, joint swelling, muscle pain, muscle weakness  Skin: Denies: rash, itching, hives.  Psych: Denies: SI/HI   Vitals:  Vitals:   12/19/16 1009  BP: 118/82  Pulse: 84  Temp: 97.4 F (36.3 C)     Body mass index is 38.87 kg/m.   Physical Exam:    General: Alert, cooperative, appears stated age and no distress.  HEENT:  Normocephalic, without obvious abnormality, atraumatic. Conjunctivae/corneas clear. PERRL, EOM's intact. Normal TM's and external ear canals both ears. Nares normal. Septum midline. Mucosa normal. No drainage or sinus tenderness. Lips, mucosa, and tongue normal; teeth and gums normal.  Lungs: Clear to auscultation bilaterally.  Heart:: Regular rate and rhythm, S1, S2 normal, no murmur, click, rub or gallop.  Abdomen: Soft, non-tender; bowel sounds normal; no masses,  no organomegaly.  Extremities: Extremities normal, atraumatic, no cyanosis or edema.  Pulses: 2+ and symmetric.  Skin: Skin color, texture, turgor normal. No rashes or lesions.  Neurologic: Alert and oriented X 3, normal strength and tone. Normal symmetric. reflexes. Normal coordination and gait.  Psych: Alert,oriented, in NAD  with a full range of affect, normal behavior and no psychotic features     Results for orders placed or performed in visit on 12/19/16  POC Urinalysis Dipstick  Result Value Ref Range   Color, UA yellow    Clarity, UA clear    Glucose, UA negative    Bilirubin, UA negative    Ketones, UA negative    Spec Grav, UA 1.020    Blood, UA trace-lysed    pH, UA 7.0    Protein, UA 2+    Urobilinogen, UA 0.2    Nitrite, UA negative    Leukocytes, UA Negative Negative     Assessment and Plan:   Vaneta was seen today for urinary tract infection.  Diagnoses and all orders for this visit:  Dysuria Comments: UCx pending.  Okay to start Keflex now. Will stop if culture is negative.  Orders: -     POC Urinalysis Dipstick -     Urine culture -     cephALEXin (KEFLEX) 500 MG capsule; Take 1 capsule (500 mg total) by mouth 3 (three) times daily.  Reactive depression -     sertraline (ZOLOFT) 50 MG tablet; Take 1 tablet (50 mg total) by mouth daily. -     EKG 12-Lead - Patient was given information on SSRIs and possible side effects were reviewed. She was asked to contact us with any worsening in symptoms or suicidal thoughts and we discussed that it would take 2-4 weeks to begin to see improvement in her symptoms.   Acute nasopharyngitis Comments: Viral. Symptomatic care reviewed.  I spent 40 minutes with this patient, greater than 50% was face-to-face time counseling regarding the above diagnoses.   Medications prescribed today Requested Prescriptions   Signed Prescriptions Disp Refills  . cephALEXin (KEFLEX) 500 MG capsule 15 capsule 0    Sig: Take 1 capsule (500 mg total) by mouth 3 (three) times daily.  . sertraline (ZOLOFT) 50 MG tablet 30 tablet 3    Sig: Take 1 tablet (50 mg total) by mouth daily.   Orders Orders Placed This Encounter  Procedures  . Urine culture  . POC Urinalysis Dipstick  . EKG 12-Lead    Briscoe Deutscher, D.O. Learned at  [facility]

## 2016-12-19 NOTE — Progress Notes (Signed)
Pre visit review using our clinic review tool, if applicable. No additional management support is needed unless otherwise documented below in the visit note. 

## 2016-12-20 ENCOUNTER — Other Ambulatory Visit: Payer: Self-pay | Admitting: Family Medicine

## 2016-12-21 LAB — URINE CULTURE: Organism ID, Bacteria: NO GROWTH

## 2017-01-09 ENCOUNTER — Encounter: Payer: Self-pay | Admitting: Family Medicine

## 2017-01-09 ENCOUNTER — Ambulatory Visit (INDEPENDENT_AMBULATORY_CARE_PROVIDER_SITE_OTHER): Payer: Medicare HMO | Admitting: Family Medicine

## 2017-01-09 VITALS — BP 108/68 | HR 85 | Temp 97.9°F | Ht 67.0 in | Wt 246.4 lb

## 2017-01-09 DIAGNOSIS — Z7289 Other problems related to lifestyle: Secondary | ICD-10-CM

## 2017-01-09 DIAGNOSIS — E785 Hyperlipidemia, unspecified: Secondary | ICD-10-CM | POA: Diagnosis not present

## 2017-01-09 DIAGNOSIS — Z0000 Encounter for general adult medical examination without abnormal findings: Secondary | ICD-10-CM

## 2017-01-09 DIAGNOSIS — I10 Essential (primary) hypertension: Secondary | ICD-10-CM

## 2017-01-09 DIAGNOSIS — Z23 Encounter for immunization: Secondary | ICD-10-CM | POA: Diagnosis not present

## 2017-01-09 DIAGNOSIS — J449 Chronic obstructive pulmonary disease, unspecified: Secondary | ICD-10-CM

## 2017-01-09 DIAGNOSIS — F329 Major depressive disorder, single episode, unspecified: Secondary | ICD-10-CM

## 2017-01-09 DIAGNOSIS — F411 Generalized anxiety disorder: Secondary | ICD-10-CM

## 2017-01-09 MED ORDER — SERTRALINE HCL 50 MG PO TABS: 50.0000 mg | ORAL_TABLET | Freq: Every day | ORAL | 3 refills | Status: DC

## 2017-01-09 MED ORDER — CLONAZEPAM 0.5 MG PO TABS: 0.5000 mg | ORAL_TABLET | Freq: Two times a day (BID) | ORAL | 1 refills | Status: DC | PRN

## 2017-01-09 MED ORDER — ALBUTEROL SULFATE HFA 108 (90 BASE) MCG/ACT IN AERS: 2.0000 | INHALATION_SPRAY | Freq: Four times a day (QID) | RESPIRATORY_TRACT | 2 refills | Status: DC | PRN

## 2017-01-09 NOTE — Patient Instructions (Addendum)
Pneumovax 23  Have them send me a copy of next mammogram. I didn't end up getting the 2017 copy. Also need bone density updated- have them send me a copy  So thrilled you are feeling better from depression perspective. Would love to check in perhaps 3 months from now to see how you are doing  Sent in albuterol for you  For you- zoloft and klonopin refilled  For Al- refilled klonopin- has refills on lexapro and wellbutrin. Roselyn Reef will reenter the cardiology referral      Starting October 1st 2018, I will be transferring to our new location: [address] [place], [place] Phone: [phone]  I would love to have you remain my patient at this new location as long as it remains convenient for you. I am excited about the opportunity to have x-ray and sports medicine in the new building but will really miss the awesome staff and physicians at [place]. Continue to schedule appointments at [facility] and we will automatically transfer them to the [place] location starting October 1st.

## 2017-01-09 NOTE — Progress Notes (Signed)
Pre visit review using our clinic review tool, if applicable. No additional management support is needed unless otherwise documented below in the visit note. 

## 2017-01-09 NOTE — Addendum Note (Signed)
Addended by: Lucianne Lei M on: 01/09/2017 10:56 AM   Modules accepted: Orders

## 2017-01-09 NOTE — Progress Notes (Signed)
Phone: [phone]  Subjective:  Patient presents today for their annual physical. Chief complaint-noted.   See problem oriented charting- ROS- full  review of systems was completed and negative except for: occasional wheeze and shortness of breath if gets a cold- albuterol helps  The following were reviewed and entered/updated in epic: Past Medical History:  Diagnosis Date  . Anxiety    years ago - no longer an issue  . Arthritis    gout  . Cataracts, bilateral   . Complication of anesthesia   . GERD (gastroesophageal reflux disease)   . Hx of chest pain    "anxiety"  . Hyperlipidemia   . INSOMNIA 10/14/2008   in past  . Low blood sugar    eats several smaller meals a day  . Palpitations    Stress related   . Proteinuria    H/O  . Tobacco abuse    quit smoking 08/2013   Patient Active Problem List   Diagnosis Date Noted  . S/p nephrectomy 03/12/2015    Priority: High  . Essential hypertension 01/04/2016    Priority: Medium  . Gout 03/12/2015    Priority: Medium  . Hyperlipidemia 03/12/2015    Priority: Medium  . COPD (chronic obstructive pulmonary disease) ([place]) 03/12/2015    Priority: Medium  . Arthritis     Priority: Medium  . Former smoker     Priority: Medium  . MICROALBUMINURIA 01/23/2008    Priority: Medium  . Eczema 06/29/2015    Priority: Low  . Tinnitus 03/12/2015    Priority: Low  . History of colonic polyps 03/12/2015    Priority: Low  . Bimalleolar fracture 07/02/2014    Priority: Low  . GERD 01/23/2008    Priority: Low  . Microscopic hematuria 01/23/2008    Priority: Low  . UTI (urinary tract infection) 01/08/2008    Priority: Low  . Reactive depression 12/19/2016  . Anxiety state 07/20/2016   Past Surgical History:  Procedure Laterality Date  . APPENDECTOMY     with hysterectomy  . childbirth     x4  . CHOLECYSTECTOMY  2001  . EYE SURGERY Bilateral 2015   cataract surgery with lens implant  . NEPHRECTOMY  1992   complex  cystic mass - nephrectomy right  . ORIF ANKLE FRACTURE Right 07/02/2014   Procedure: OPEN REDUCTION INTERNAL FIXATION (ORIF) RIGHT ANKLE BIMALLOELAR FRACTURE;  Surgeon: Wylene Simmer, MD;  Location: [place];  Service: Orthopedics;  Laterality: Right;  . OTHER SURGICAL HISTORY     hysterectomy, R ovary remains  . SHOULDER SURGERY     bone spurs left    Family History  Problem Relation Age of Onset  . Stroke Father   . Alzheimer's disease Father     early, states also parkinsons. died 67  . Hypertension Father   . Ovarian cancer Mother     Dr. Jana Hakim   . Arthritis Mother   . Colon cancer Paternal Grandmother   . Liver disease Daughter     On hospice age 38    Medications- reviewed and updated Current Outpatient Prescriptions  Medication Sig Dispense Refill  . allopurinol (ZYLOPRIM) 300 MG tablet TAKE 1 TABLET (300 MG TOTAL) 2 TIMES DAILY. 180 tablet 3  . aspirin 81 MG tablet Take 81 mg by mouth daily.    [name] CALCIUM-MAGNESIUM-ZINC PO Take by mouth.    . clonazePAM (KLONOPIN) 0.5 MG tablet Take 1 tablet (0.5 mg total) by mouth 2 (two) times daily as needed for anxiety.  60 tablet 1  . esomeprazole (NEXIUM 24HR) 20 MG capsule Take 20 mg by mouth every morning.    [name] lisinopril (PRINIVIL,ZESTRIL) 2.5 MG tablet TAKE 1 TABLET (2.5 MG TOTAL) BY MOUTH DAILY. 90 tablet 3  . nystatin cream (MYCOSTATIN) Reported on 03/14/2016  0  . OVER THE COUNTER MEDICATION Cherry extract bid    . OVER THE COUNTER MEDICATION B-12 one daily    . OVER THE COUNTER MEDICATION Olive leaf extract    . OVER THE COUNTER MEDICATION Vitamin D    . sertraline (ZOLOFT) 50 MG tablet Take 1 tablet (50 mg total) by mouth daily. 90 tablet 3  . simvastatin (ZOCOR) 20 MG tablet Take 1 tablet (20 mg total) by mouth at bedtime. 90 tablet 3  . triamcinolone cream (KENALOG) 0.1 % Apply 1 application topically 2 (two) times daily. For 7-10 days for recurring eczema issues 80 g 1  . albuterol (PROVENTIL HFA;VENTOLIN HFA) 108 (90 Base)  MCG/ACT inhaler Inhale 2 puffs into the lungs every 6 (six) hours as needed for wheezing or shortness of breath. 1 Inhaler 2   No current facility-administered medications for this visit.     Allergies-reviewed and updated Allergies  Allergen Reactions  . Epinephrine Palpitations  . Codeine Nausea Only  . Penicillins Nausea Only    Social History   Social History  . Marital status: Single    Spouse name: N/A  . Number of children: N/A  . Years of education: N/A   Social History Main Topics  . Smoking status: Former Smoker    Packs/day: 1.00    Years: 35.00    Types: Cigarettes    Quit date: 09/01/2013  . Smokeless tobacco: Never Used     Comment: Former smoker with no desire to smoke again. Smoking years edited at patients request to include periods when she did not smoke.  [name] Alcohol use 0.6 oz/week    1 Shots of liquor per week  . Drug use: No  . Sexual activity: Not Asked   Other Topics Concern  . None   Social History Narrative   Divorced. Found love of her life recently in 2016 (Al). 3 sons. 5 grandkids.    Daughter (had at age 70 and was adopted). To care for [name] her granddaughter when daughter passes- daughter on hospice.       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 Oropharynx normal Neck: no thyromegaly CV: RRR no murmurs rubs or gallops Lungs: CTAB no crackles, wheeze, rhonchi Abdomen: soft/nontender/nondistended/normal bowel sounds. No rebound or guarding.  Ext: no  edema Skin: warm, dry Neuro: grossly normal, moves all extremities, PERRLA  Assessment/Plan:  71 y.o. female presenting for annual physical.  Health Maintenance counseling: 1. Anticipatory guidance: Patient counseled regarding regular dental exams q6 months, eye exams - as needed Dr. Katy Fitch, wearing seatbelts.  2. Risk factor reduction:  Advised patient of need for regular exercise and  diet rich and fruits and vegetables to reduce risk of heart attack and stroke. Exercise- goal is to get back to personal training. Diet-wants to work on eating more salads. motivation seems low- seems to deflect to husbands issues.  Wt Readings from Last 3 Encounters:  01/09/17 246 lb 6.4 oz (111.8 kg)  12/19/16 248 lb 3.2 oz (112.6 kg)  07/20/16 240 lb (108.9 kg)   3. Immunizations/screenings/ancillary studies Immunization History  Administered Date(s) Administered  . Influenza, High Dose Seasonal PF 09/29/2016  . Influenza,inj,Quad PF,36+ Mos 08/24/2015  . Pneumococcal Conjugate-13 03/12/2015  . Td 04/24/2016  . Zoster 03/12/2015   Health Maintenance Due  Topic Date Due  . Hepatitis C Screening - with labs 10/23/1946  . DEXA SCAN - plans to do with GYN 05/27/2012  . PNA vac Low Risk Adult (2 of 2 - PPSV23)- today 03/11/2016   4. Cervical cancer screening- sees GYN, passed age based screening. Will see gynecology on 23rd.  [facility] 5. Breast cancer screening-  breast exam today  and mammogram 03/29/15 in our system, actually had one last year but they did not send to me.  6. Colon cancer screening - 05/22/15 with 5 year follow up 7. Skin cancer prevention- uses regular sunscreen  Status of chronic or acute concerns  Gout- doing well with allopurinol- not needing colchicine.   HLD- controlled on simvastatin 20mg  last check. Update today  Hypertension and unilateral kidney due to resection of kidney or cystic mass- controlled on lisinopril 2.5mg . GFR in 40s baseline- update today  COPD-  diagnosed years ago as former smoker- fortunately asymptomatic for most part- some wheeze when gets colds. Sometimes with chest congestion and mucinex will help. Will refill albuterol   Anxiety/depression- was started on zoloft 50mg . This was helpful. She also has been given clonazepam in the past- using once a night only now- no daytime anxiety since zoloft. PHQ9 and GAD7 of 5 or less for each. No SI.   Prn nexium for GERD- weight loss would help  3 month advised  Meds ordered this encounter  Medications  . albuterol (PROVENTIL HFA;VENTOLIN HFA) 108 (90 Base) MCG/ACT inhaler    Sig: Inhale 2 puffs into the lungs every 6 (six) hours as needed for wheezing or shortness of breath.    Dispense:  1 Inhaler    Refill:  2  . clonazePAM (KLONOPIN) 0.5 MG tablet    Sig: Take 1 tablet (0.5 mg total) by mouth 2 (two) times daily as needed for anxiety.    Dispense:  60 tablet    Refill:  1    Fax to [facility]  . sertraline (ZOLOFT) 50 MG tablet    Sig: Take 1 tablet (50 mg total) by mouth daily.    Dispense:  90 tablet    Refill:  3    Return precautions advised.   Garret Reddish, MD

## 2017-01-09 NOTE — Addendum Note (Signed)
Addended by: Marin Olp on: 01/09/2017 10:42 AM   Modules accepted: Orders

## 2017-01-10 ENCOUNTER — Other Ambulatory Visit: Payer: Medicare HMO

## 2017-01-18 ENCOUNTER — Other Ambulatory Visit: Payer: Self-pay | Admitting: Family Medicine

## 2017-01-18 ENCOUNTER — Other Ambulatory Visit (INDEPENDENT_AMBULATORY_CARE_PROVIDER_SITE_OTHER): Payer: Medicare HMO

## 2017-01-18 DIAGNOSIS — E785 Hyperlipidemia, unspecified: Secondary | ICD-10-CM

## 2017-01-18 DIAGNOSIS — Z1159 Encounter for screening for other viral diseases: Secondary | ICD-10-CM | POA: Diagnosis not present

## 2017-01-18 LAB — COMPREHENSIVE METABOLIC PANEL
ALK PHOS: 76 U/L (ref 39–117)
ALT: 28 U/L (ref 0–35)
AST: 23 U/L (ref 0–37)
Albumin: 4.3 g/dL (ref 3.5–5.2)
BUN: 21 mg/dL (ref 6–23)
CO2: 28 meq/L (ref 19–32)
Calcium: 9.1 mg/dL (ref 8.4–10.5)
Chloride: 105 mEq/L (ref 96–112)
Creatinine, Ser: 1.31 mg/dL — ABNORMAL HIGH (ref 0.40–1.20)
GFR: 42.7 mL/min — AB (ref >60.00)
GLUCOSE: 109 mg/dL — AB (ref 70–99)
POTASSIUM: 3.9 meq/L (ref 3.5–5.1)
SODIUM: 141 meq/L (ref 135–145)
TOTAL PROTEIN: 6.4 g/dL (ref 6.0–8.3)
Total Bilirubin: 0.3 mg/dL (ref 0.2–1.2)

## 2017-01-18 LAB — CBC
HEMATOCRIT: 39 % (ref 36.0–46.0)
HEMOGLOBIN: 12.9 g/dL (ref 12.0–15.0)
MCHC: 33.2 g/dL (ref 30.0–36.0)
MCV: 88.7 fl (ref 78.0–100.0)
Platelets: 240 10*3/uL (ref 150.0–400.0)
RBC: 4.39 Mil/uL (ref 3.87–5.11)
RDW: 15.7 % — AB (ref 11.5–15.5)
WBC: 6.6 10*3/uL (ref 4.0–10.5)

## 2017-01-18 LAB — LIPID PANEL
Cholesterol: 167 mg/dL (ref 0–200)
HDL: 69.8 mg/dL (ref >39.00)
LDL CALC: 69 mg/dL (ref 0–99)
NONHDL: 97.52
Total CHOL/HDL Ratio: 2
Triglycerides: 141 mg/dL (ref 0.0–149.0)
VLDL: 28.2 mg/dL (ref 0.0–40.0)

## 2017-01-18 LAB — LDL CHOLESTEROL, DIRECT: Direct LDL: 77 mg/dL

## 2017-01-19 LAB — HEPATITIS C ANTIBODY: HCV AB: NEGATIVE

## 2017-01-26 DIAGNOSIS — Z8742 Personal history of other diseases of the female genital tract: Secondary | ICD-10-CM | POA: Diagnosis not present

## 2017-01-26 DIAGNOSIS — R1031 Right lower quadrant pain: Secondary | ICD-10-CM | POA: Diagnosis not present

## 2017-02-21 ENCOUNTER — Telehealth: Payer: Self-pay | Admitting: Family Medicine

## 2017-02-21 NOTE — Telephone Encounter (Signed)
Noted thanks °

## 2017-02-21 NOTE — Telephone Encounter (Signed)
Pt states she is in a lot of hip pain. Pt states she had mentioned she would need to see orthopedists if this persist. Would like you to know she is going to contact  [facility] to see a dr.

## 2017-02-22 DIAGNOSIS — M25551 Pain in right hip: Secondary | ICD-10-CM | POA: Diagnosis not present

## 2017-02-22 DIAGNOSIS — M1611 Unilateral primary osteoarthritis, right hip: Secondary | ICD-10-CM | POA: Diagnosis not present

## 2017-03-01 DIAGNOSIS — M1611 Unilateral primary osteoarthritis, right hip: Secondary | ICD-10-CM | POA: Diagnosis not present

## 2017-03-09 DIAGNOSIS — M1611 Unilateral primary osteoarthritis, right hip: Secondary | ICD-10-CM | POA: Diagnosis not present

## 2017-03-20 ENCOUNTER — Telehealth: Payer: Self-pay | Admitting: Family Medicine

## 2017-03-20 NOTE — Telephone Encounter (Signed)
° °  Pt request refill of the following:  clonazePAM (KLONOPIN) 0.5 MG tablet   Phamacy: [facility]

## 2017-03-22 ENCOUNTER — Other Ambulatory Visit: Payer: Self-pay

## 2017-03-22 MED ORDER — CLONAZEPAM 0.5 MG PO TABS: 0.5000 mg | ORAL_TABLET | Freq: Two times a day (BID) | ORAL | 1 refills | Status: DC | PRN

## 2017-03-22 NOTE — Telephone Encounter (Signed)
Prescription faxed to pharmacy.

## 2017-03-26 ENCOUNTER — Ambulatory Visit (INDEPENDENT_AMBULATORY_CARE_PROVIDER_SITE_OTHER): Payer: Medicare HMO | Admitting: Family Medicine

## 2017-03-26 ENCOUNTER — Encounter: Payer: Self-pay | Admitting: Family Medicine

## 2017-03-26 VITALS — BP 108/72 | HR 86 | Temp 98.2°F | Ht 67.0 in | Wt 247.8 lb

## 2017-03-26 DIAGNOSIS — E7800 Pure hypercholesterolemia, unspecified: Secondary | ICD-10-CM

## 2017-03-26 DIAGNOSIS — R0789 Other chest pain: Secondary | ICD-10-CM

## 2017-03-26 DIAGNOSIS — F329 Major depressive disorder, single episode, unspecified: Secondary | ICD-10-CM | POA: Diagnosis not present

## 2017-03-26 NOTE — Assessment & Plan Note (Signed)
S: well controlled on simvastatin 20mg . No myalgias.  Lab Results  Component Value Date   CHOL 167 01/18/2017   HDL 69.80 01/18/2017   LDLCALC 69 01/18/2017   LDLDIRECT 77.0 01/18/2017   TRIG 141.0 01/18/2017   CHOLHDL 2 01/18/2017   A/P: LDL under 100- will continue current medications

## 2017-03-26 NOTE — Progress Notes (Signed)
Pre visit review using our clinic review tool, if applicable. No additional management support is needed unless otherwise documented below in the visit note. 

## 2017-03-26 NOTE — Patient Instructions (Addendum)
No changes today  Last EKG looked ok. Does not fully rule out heart disease. There is some risk with your symptoms of underlying heart disease. We discussed stress test option- you declined for now- let me know if you change your mind and seek care for worsening symptoms immediately

## 2017-03-26 NOTE — Progress Notes (Signed)
Subjective:  Angie Velasquez is a 71 y.o. year old very pleasant female patient who presents for/with See problem oriented charting ROS- no Si. Admits to being down about her situation at home in caring for husband. No generalized anxiety or depressed mood- all related to home situation. Does have chest pain that goes to left arm and neck at times.    Past Medical History-  Patient Active Problem List   Diagnosis Date Noted  . Reactive depression 12/19/2016    Priority: High  . Anxiety state 07/20/2016    Priority: High  . S/p nephrectomy 03/12/2015    Priority: High  . Essential hypertension 01/04/2016    Priority: Medium  . Gout 03/12/2015    Priority: Medium  . Hyperlipidemia 03/12/2015    Priority: Medium  . COPD (chronic obstructive pulmonary disease) ([place]) 03/12/2015    Priority: Medium  . Arthritis     Priority: Medium  . Former smoker     Priority: Medium  . MICROALBUMINURIA 01/23/2008    Priority: Medium  . Eczema 06/29/2015    Priority: Low  . Tinnitus 03/12/2015    Priority: Low  . History of colonic polyps 03/12/2015    Priority: Low  . Bimalleolar fracture 07/02/2014    Priority: Low  . GERD 01/23/2008    Priority: Low  . Microscopic hematuria 01/23/2008    Priority: Low  . UTI (urinary tract infection) 01/08/2008    Priority: Low  . Atypical chest pain 03/26/2017    Medications- reviewed and updated Current Outpatient Prescriptions  Medication Sig Dispense Refill  . albuterol (PROVENTIL HFA;VENTOLIN HFA) 108 (90 Base) MCG/ACT inhaler Inhale 2 puffs into the lungs every 6 (six) hours as needed for wheezing or shortness of breath. 1 Inhaler 2  . allopurinol (ZYLOPRIM) 300 MG tablet TAKE 1 TABLET (300 MG TOTAL) 2 TIMES DAILY. 180 tablet 3  . aspirin 81 MG tablet Take 81 mg by mouth daily.    [name] CALCIUM-MAGNESIUM-ZINC PO Take by mouth.    . clonazePAM (KLONOPIN) 0.5 MG tablet Take 1 tablet (0.5 mg total) by mouth 2 (two) times daily as needed for  anxiety. 60 tablet 1  . esomeprazole (NEXIUM 24HR) 20 MG capsule Take 20 mg by mouth every morning.    [name] lisinopril (PRINIVIL,ZESTRIL) 2.5 MG tablet TAKE 1 TABLET (2.5 MG TOTAL) BY MOUTH DAILY. 90 tablet 3  . nystatin cream (MYCOSTATIN) Reported on 03/14/2016  0  . OVER THE COUNTER MEDICATION Cherry extract bid    . OVER THE COUNTER MEDICATION B-12 one daily    . OVER THE COUNTER MEDICATION Olive leaf extract    . OVER THE COUNTER MEDICATION Vitamin D    . sertraline (ZOLOFT) 50 MG tablet Take 1 tablet (50 mg total) by mouth daily. 90 tablet 3  . simvastatin (ZOCOR) 20 MG tablet Take 1 tablet (20 mg total) by mouth at bedtime. 90 tablet 3  . triamcinolone cream (KENALOG) 0.1 % Apply 1 application topically 2 (two) times daily. For 7-10 days for recurring eczema issues 80 g 1   No current facility-administered medications for this visit.     Objective: BP 108/72 (BP Location: Left Arm, Patient Position: Sitting, Cuff Size: Large)   Pulse 86   Temp 98.2 F (36.8 C) (Oral)   Ht 5\' 7"  (1.702 m)   Wt 247 lb 12.8 oz (112.4 kg)   SpO2 94%   BMI 38.81 kg/m  Gen: NAD, resting comfortably CV: RRR no murmurs rubs or gallops Lungs:  CTAB no crackles, wheeze, rhonchi Ext: no edema Skin: warm, dry, no rash  Assessment/Plan:  Hyperlipidemia S: well controlled on simvastatin 20mg . No myalgias.  Lab Results  Component Value Date   CHOL 167 01/18/2017   HDL 69.80 01/18/2017   LDLCALC 69 01/18/2017   LDLDIRECT 77.0 01/18/2017   TRIG 141.0 01/18/2017   CHOLHDL 2 01/18/2017   A/P: LDL under 100- will continue current medications  Reactive depression S: She states she has reasonable control on zoloft 50mg . For anxiety/racing thoughts has to use clonazepam at night and is using every night at this point   We went over following concerns- her husband is hoarding, he may have dementia or simply deficits from prior traumatic brain injury. Husband stopped talking all medicines. She worries about  him working as a Engineer, maintenance (IT) but he finally agreed to retire. She wants him to stop doing real estate. He wont do marriage counseling.   A/P: continue current meds. She is going to see Lambert Mody for therapy. She also will see her attorney- she had looked for a home to move out and threatened divorce but that seemed to help her husband resolve some of his issues- he really wants to make the marriage work.    Extended counseling on this topic.   Atypical chest pain S:Left sided chest pain for at least 4 months. Lessened when stress lower. Worse with periods of high stress. Some pain into left arm and left jaw. Not exertional. No shortness of breath with this. She had an EKG in January when reporting this to another provider and was told EKG was reassuring per her report.  A/P: I discussed with patient with 4 months of chest pain, better with less anxiety, worse with anxiety that I doubted cardiac chest pain but that being said would want to rule it out. I advised getting a stress test which she declines- she is aware of risk of heart disease/death but still declines- she feels she is too busy to complete this at present. She knows to seek care for worsening symptoms immediately.    Return in about 3 months (around 06/25/2017) for follow up- or sooner if needed.  The duration of face-to-face time during this visit was greater than 25 minutes. Greater than 50% of this time was spent in counseling, explanation of diagnosis, planning of further management, and/or coordination of care including discussion of depression primarily as well as risks of not further working up chest pain.    Return precautions advised.  Garret Reddish, MD

## 2017-03-26 NOTE — Assessment & Plan Note (Signed)
S: She states she has reasonable control on zoloft 50mg . For anxiety/racing thoughts has to use clonazepam at night and is using every night at this point   We went over following concerns- her husband is hoarding, he may have dementia or simply deficits from prior traumatic brain injury. Husband stopped talking all medicines. She worries about him working as a Engineer, maintenance (IT) but he finally agreed to retire. She wants him to stop doing real estate. He wont do marriage counseling.   A/P: continue current meds. She is going to see Lambert Mody for therapy. She also will see her attorney- she had looked for a home to move out and threatened divorce but that seemed to help her husband resolve some of his issues- he really wants to make the marriage work.    Extended counseling on this topic.

## 2017-03-26 NOTE — Assessment & Plan Note (Signed)
S:Left sided chest pain for at least 4 months. Lessened when stress lower. Worse with periods of high stress. Some pain into left arm and left jaw. Not exertional. No shortness of breath with this. She had an EKG in January when reporting this to another provider and was told EKG was reassuring per her report.  A/P: I discussed with patient with 4 months of chest pain, better with less anxiety, worse with anxiety that I doubted cardiac chest pain but that being said would want to rule it out. I advised getting a stress test which she declines- she is aware of risk of heart disease/death but still declines- she feels she is too busy to complete this at present. She knows to seek care for worsening symptoms immediately.

## 2017-05-28 ENCOUNTER — Ambulatory Visit: Payer: Medicare HMO | Admitting: Family Medicine

## 2017-05-31 DIAGNOSIS — M1611 Unilateral primary osteoarthritis, right hip: Secondary | ICD-10-CM | POA: Diagnosis not present

## 2017-06-05 ENCOUNTER — Other Ambulatory Visit: Payer: Self-pay | Admitting: Physical Medicine and Rehabilitation

## 2017-06-05 DIAGNOSIS — M25551 Pain in right hip: Secondary | ICD-10-CM

## 2017-06-13 ENCOUNTER — Other Ambulatory Visit: Payer: Self-pay

## 2017-06-13 ENCOUNTER — Encounter: Payer: Self-pay | Admitting: Family Medicine

## 2017-06-13 ENCOUNTER — Ambulatory Visit (INDEPENDENT_AMBULATORY_CARE_PROVIDER_SITE_OTHER): Payer: Medicare HMO | Admitting: Family Medicine

## 2017-06-13 DIAGNOSIS — F329 Major depressive disorder, single episode, unspecified: Secondary | ICD-10-CM

## 2017-06-13 DIAGNOSIS — E6609 Other obesity due to excess calories: Secondary | ICD-10-CM | POA: Insufficient documentation

## 2017-06-13 DIAGNOSIS — Z6835 Body mass index (BMI) 35.0-35.9, adult: Secondary | ICD-10-CM

## 2017-06-13 DIAGNOSIS — E66812 Obesity, class 2: Secondary | ICD-10-CM | POA: Insufficient documentation

## 2017-06-13 DIAGNOSIS — Z6839 Body mass index (BMI) 39.0-39.9, adult: Secondary | ICD-10-CM | POA: Insufficient documentation

## 2017-06-13 MED ORDER — LISINOPRIL 2.5 MG PO TABS: 2.5000 mg | ORAL_TABLET | Freq: Every day | ORAL | 3 refills | Status: DC
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
## 2017-06-13 NOTE — Patient Instructions (Signed)
We will call you within a week or two about your referral to weight loss clinic with Dr. Leafy Ro. If you do not hear within 3 weeks, give [name] a call.   No changes in medications today
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
## 2017-06-13 NOTE — Assessment & Plan Note (Signed)
BMI >35 with hypertension and hyperlipidemia. Has been stress eating due to stressors. I encouraged her to follow up with a counselor. Discussed weight watchers or nutrition referral- she declines both. She wants a "quick fix" to get her started on weight loss. We discussed it is all about a long term plan for healthy eating, regular exercise and self care. She did ultimately agree to referral to obesity specialist Dr. Leafy Ro after counseling and referral placed today

## 2017-06-15 ENCOUNTER — Telehealth: Payer: Self-pay | Admitting: Family Medicine

## 2017-06-15 NOTE — Telephone Encounter (Signed)
Called patient. Took tylenol and pain now better. Told her it was ok to take tylenol with tramadol. She is awaiting MRI with orthopedics.

## 2017-06-15 NOTE — Telephone Encounter (Signed)
[facility] Patient Name: Angie Velasquez DOB: 1946-07-04 Initial Comment Caller's right hip is hurting so bad that it's making her nausous, she has taken 1 tramadol about an hour ago & it's not touching the pain, she is wanting to know if she can take more than one a day & if so how often Nurse Assessment Nurse: Jimmey Ralph, RN, Epifanio Lesches Date/Time (Eastern Time): 06/15/2017 3:46:24 PM Confirm and document reason for call. If symptomatic, describe symptoms. ---Caller's right hip is hurting so bad that it's making her nauseous, she has taken 1 tramadol about an hour ago & it's not touching the pain, she is wanting to know if she can take more than one a day & if so how often. they have been giving shots of cortisone in this hip, but today the hip feels like there is a knife in it. No injury and they are suppose to schedule MRI for hip replacement. Does the patient have any new or worsening symptoms? ---Yes Will a triage be completed? ---Yes Related visit to physician within the last 2 weeks? ---Yes Does the PT have any chronic conditions? (i.e. diabetes, asthma, etc.) ---No Is this a behavioral health or substance abuse call? ---No Guidelines Guideline Title Affirmed Question Affirmed Notes Hip Pain [1] SEVERE pain (e.g., excruciating, unable to do any normal activities) AND [2] not improved after 2 hours of pain medicine Final Disposition User See Physician within 4 Hours (or PCP triage) Hammonds, RN, Lissa Comments I woke up with a back ache and the hip pain is getting worse. Pain is 8/10 and constant. I have hydrocodone and I want to take it. Referrals GO TO FACILITY OTHER - SPECIFY Disagree/Comply: Comply

## 2017-06-16 ENCOUNTER — Emergency Department (HOSPITAL_COMMUNITY)
Admission: EM | Admit: 2017-06-16 | Discharge: 2017-06-16 | Disposition: A | Discharge status: Home / Self Care | Payer: Medicare HMO | Attending: Physician Assistant | Admitting: Physician Assistant

## 2017-06-16 ENCOUNTER — Emergency Department (HOSPITAL_COMMUNITY): Payer: Medicare HMO

## 2017-06-16 ENCOUNTER — Encounter (HOSPITAL_COMMUNITY): Payer: Self-pay | Admitting: Emergency Medicine

## 2017-06-16 DIAGNOSIS — J449 Chronic obstructive pulmonary disease, unspecified: Secondary | ICD-10-CM | POA: Insufficient documentation

## 2017-06-16 DIAGNOSIS — M25551 Pain in right hip: Secondary | ICD-10-CM

## 2017-06-16 DIAGNOSIS — Z79899 Other long term (current) drug therapy: Secondary | ICD-10-CM | POA: Diagnosis not present

## 2017-06-16 DIAGNOSIS — Z87891 Personal history of nicotine dependence: Secondary | ICD-10-CM | POA: Diagnosis not present

## 2017-06-16 DIAGNOSIS — I10 Essential (primary) hypertension: Secondary | ICD-10-CM | POA: Insufficient documentation

## 2017-06-16 NOTE — ED Provider Notes (Signed)
[facility] DEPT Provider Note   CSN: [id number] Arrival date & time: 06/16/17  1454     History   Chief Complaint Chief Complaint  Patient presents with  . Hip Pain    HPI Angie Velasquez is a 71 y.o. female.  HPI   Pt is a 71 year old presenting with R hip pain. Plans to have it fixed by [facility]. Had recent cortisone shot.  Has been taking tramadol + tyelnol, which helps. Patient called Ortho's office and they told her to the emergency department. Patient had no recent trauma.  Past Medical History:  Diagnosis Date  . Anxiety    years ago - no longer an issue  . Arthritis    gout  . Cataracts, bilateral   . Complication of anesthesia   . GERD (gastroesophageal reflux disease)   . Hx of chest pain    "anxiety"  . Hyperlipidemia   . INSOMNIA 10/14/2008   in past  . Low blood sugar    eats several smaller meals a day  . Palpitations    Stress related   . Proteinuria    H/O  . Tobacco abuse    quit smoking 08/2013    Patient Active Problem List   Diagnosis Date Noted  . Morbid obesity ([place]) 06/13/2017  . Atypical chest pain 03/26/2017  . Reactive depression 12/19/2016  . Anxiety state 07/20/2016  . Essential hypertension 01/04/2016  . Eczema 06/29/2015  . Gout 03/12/2015  . S/p nephrectomy 03/12/2015  . Hyperlipidemia 03/12/2015  . Tinnitus 03/12/2015  . COPD (chronic obstructive pulmonary disease) ([place]) 03/12/2015  . History of colonic polyps 03/12/2015  . Bimalleolar fracture 07/02/2014  . Arthritis   . Former smoker   . GERD 01/23/2008  . MICROALBUMINURIA 01/23/2008  . Microscopic hematuria 01/23/2008  . UTI (urinary tract infection) 01/08/2008    Past Surgical History:  Procedure Laterality Date  . APPENDECTOMY     with hysterectomy  . childbirth     x4  . CHOLECYSTECTOMY  2001  . EYE SURGERY Bilateral 2015   cataract surgery with lens implant  . NEPHRECTOMY  1992   complex cystic mass - nephrectomy right  . ORIF ANKLE  FRACTURE Right 07/02/2014   Procedure: OPEN REDUCTION INTERNAL FIXATION (ORIF) RIGHT ANKLE BIMALLOELAR FRACTURE;  Surgeon: Wylene Simmer, MD;  Location: [facility];  Service: Orthopedics;  Laterality: Right;  . OTHER SURGICAL HISTORY     hysterectomy, R ovary remains  . SHOULDER SURGERY     bone spurs left    OB History    No data available       Home Medications    Prior to Admission medications   Medication Sig Start Date End Date Taking? Authorizing Provider  allopurinol (ZYLOPRIM) 300 MG tablet TAKE 1 TABLET (300 MG TOTAL) 2 TIMES DAILY. 08/14/16   Marin Olp, MD  aspirin 81 MG tablet Take 81 mg by mouth daily.    [provider]  CALCIUM-MAGNESIUM-ZINC PO Take by mouth.    [provider]  clonazePAM (KLONOPIN) 0.5 MG tablet Take 1 tablet (0.5 mg total) by mouth 2 (two) times daily as needed for anxiety. 03/22/17   Marin Olp, MD  esomeprazole (NEXIUM 24HR) 20 MG capsule Take 20 mg by mouth every morning.    [provider]  lisinopril (PRINIVIL,ZESTRIL) 2.5 MG tablet Take 1 tablet (2.5 mg total) by mouth daily. 06/13/17   Marin Olp, MD  nystatin cream (MYCOSTATIN) Reported on 03/14/2016 04/28/15  [provider]  OVER THE COUNTER MEDICATION Cherry extract bid    [provider]  OVER THE COUNTER MEDICATION B-12 one daily    [provider]  [place] leaf extract    [provider]  OVER THE COUNTER MEDICATION Vitamin D    [provider]  sertraline (ZOLOFT) 50 MG tablet Take 1 tablet (50 mg total) by mouth daily. 01/09/17   Marin Olp, MD  simvastatin (ZOCOR) 20 MG tablet Take 1 tablet (20 mg total) by mouth at bedtime. 04/10/16   Marin Olp, MD  triamcinolone cream (KENALOG) 0.1 % Apply 1 application topically 2 (two) times daily. For 7-10 days for recurring eczema issues 09/20/16   Marin Olp, MD    Family History Family History  Problem Relation Age  of Onset  . Stroke Father   . Alzheimer's disease Father        early, states also parkinsons. died 41  . Hypertension Father   . Ovarian cancer Mother        Dr. Jana Hakim   . Arthritis Mother   . Colon cancer Paternal Grandmother   . Liver disease Daughter        On hospice age 80    Social History Social History  Substance Use Topics  . Smoking status: Former Smoker    Packs/day: 1.00    Years: 35.00    Types: Cigarettes    Quit date: 09/01/2013  . Smokeless tobacco: Never Used     Comment: Former smoker with no desire to smoke again. Smoking years edited at patients request to include periods when she did not smoke.  [name] Alcohol use 0.6 oz/week    1 Shots of liquor per week     Allergies   Epinephrine; Codeine; and Penicillins   Review of Systems Review of Systems  Constitutional: Negative for activity change.  Respiratory: Negative for shortness of breath.   Cardiovascular: Negative for chest pain.  Gastrointestinal: Negative for abdominal pain.  Musculoskeletal: Positive for arthralgias.     Physical Exam Updated Vital Signs BP (!) 95/59 (BP Location: Right Arm)   Pulse 88   Temp 97.7 F (36.5 C) (Oral)   Resp 18   SpO2 99%   Physical Exam  Constitutional: She is oriented to person, place, and time. She appears well-developed and well-nourished.  HENT:  Head: Normocephalic and atraumatic.  Eyes: Right eye exhibits no discharge.  Cardiovascular: Normal rate, regular rhythm and normal heart sounds.   No murmur heard. Pulmonary/Chest: Effort normal and breath sounds normal. She has no wheezes. She has no rales.  Abdominal: Soft. She exhibits no distension. There is no tenderness.  Musculoskeletal:  Great range of motion at hip knee and ankle. Good pulses and sensation are intact.  Neurological: She is oriented to person, place, and time.  Skin: Skin is warm and dry. She is not diaphoretic.  Psychiatric: She has a normal mood and affect.  Nursing note  and vitals reviewed.    ED Treatments / Results  Labs (all labs ordered are listed, but only abnormal results are displayed) Labs Reviewed - No data to display  EKG  EKG Interpretation None       Radiology Dg Hip Unilat  With Pelvis 2-3 Views Right  Result Date: 06/16/2017 CLINICAL DATA:  Right hip pain.  No known injury. EXAM: DG HIP (WITH OR WITHOUT PELVIS) 2-3V RIGHT COMPARISON:  None. FINDINGS: Hip joints and SI joints are symmetric  and unremarkable. No acute bony abnormality. Specifically, no fracture, subluxation, or dislocation. Soft tissues are intact. IMPRESSION: Negative. Electronically Signed   By: Rolm Baptise M.D.   On: 06/16/2017 15:47    Procedures Procedures (including critical care time)  Medications Ordered in ED Medications - No data to display   Initial Impression / Assessment and Plan / ED Course  I have reviewed the triage vital signs and the nursing notes.  Pertinent labs & imaging results that were available during my care of the patient were reviewed by me and considered in my medical decision making (see chart for details).     Very well-appearing 71 year old female presenting with right hip pain. Patient had plan to have the right hip done by Dr. Lyla Glassing. No new trauma. Patient has great range of motion. No emergenciy, no fever. We will have him follow-up as an outpatient with orthopedic.  Final Clinical Impressions(s) / ED Diagnoses   Final diagnoses:  Right hip pain    New Prescriptions New Prescriptions   No medications on file     Macarthur Critchley, MD 06/16/17 1942

## 2017-06-16 NOTE — ED Notes (Signed)
Pt eating chic fila meal and milk shake in lobby, Pt advised she needed to wait until she seen the doctor.

## 2017-06-16 NOTE — ED Triage Notes (Addendum)
Pt reports she wants a R hip replacement. Denies any injury. Pt is ambulatory. Reports R hip pain radiating into groin. Asking for Dr. Maureen Ralphs.

## 2017-06-19 DIAGNOSIS — M8530 Osteitis condensans, unspecified site: Secondary | ICD-10-CM | POA: Diagnosis not present

## 2017-06-19 DIAGNOSIS — M24151 Other articular cartilage disorders, right hip: Secondary | ICD-10-CM | POA: Diagnosis not present

## 2017-06-19 DIAGNOSIS — M958 Other specified acquired deformities of musculoskeletal system: Secondary | ICD-10-CM | POA: Diagnosis not present

## 2017-06-19 DIAGNOSIS — M461 Sacroiliitis, not elsewhere classified: Secondary | ICD-10-CM | POA: Diagnosis not present

## 2017-06-19 DIAGNOSIS — M84350A Stress fracture, pelvis, initial encounter for fracture: Secondary | ICD-10-CM | POA: Diagnosis not present

## 2017-06-19 DIAGNOSIS — M7061 Trochanteric bursitis, right hip: Secondary | ICD-10-CM | POA: Diagnosis not present

## 2017-06-26 DIAGNOSIS — M25551 Pain in right hip: Secondary | ICD-10-CM | POA: Diagnosis not present

## 2017-06-29 ENCOUNTER — Telehealth: Payer: Self-pay | Admitting: Family Medicine

## 2017-06-29 MED ORDER — TRAMADOL HCL 50 MG PO TABS: 50.0000 mg | ORAL_TABLET | Freq: Four times a day (QID) | ORAL | 0 refills | Status: DC | PRN

## 2017-06-29 NOTE — Telephone Encounter (Signed)
In office with husband. Complains of hip pain- had pelvic fracture and following with [place] ortho- was told to continue tramadol through primary care- had used sparingly last .year. I refilled.

## 2017-06-30 ENCOUNTER — Other Ambulatory Visit: Payer: Medicare HMO

## 2017-07-19 DIAGNOSIS — Z01419 Encounter for gynecological examination (general) (routine) without abnormal findings: Secondary | ICD-10-CM | POA: Diagnosis not present

## 2017-07-19 DIAGNOSIS — Z1231 Encounter for screening mammogram for malignant neoplasm of breast: Secondary | ICD-10-CM | POA: Diagnosis not present

## 2017-07-19 LAB — HM MAMMOGRAPHY

## 2017-07-20 ENCOUNTER — Other Ambulatory Visit: Payer: Self-pay | Admitting: Family Medicine

## 2017-07-23 ENCOUNTER — Telehealth: Payer: Self-pay | Admitting: Family Medicine

## 2017-07-23 NOTE — Telephone Encounter (Signed)
° ° °  Pt request refill of the following:   clonazePAM (KLONOPIN) 0.5 MG tablet  Phamacy: [facility]

## 2017-07-25 ENCOUNTER — Other Ambulatory Visit: Payer: Self-pay

## 2017-07-25 MED ORDER — CLONAZEPAM 0.5 MG PO TABS: 0.5000 mg | ORAL_TABLET | Freq: Two times a day (BID) | ORAL | 1 refills | Status: DC | PRN

## 2017-07-25 NOTE — Telephone Encounter (Signed)
Prescription sent as requested.

## 2017-07-30 ENCOUNTER — Other Ambulatory Visit: Payer: Self-pay | Admitting: Family Medicine

## 2017-08-07 ENCOUNTER — Telehealth: Payer: Self-pay | Admitting: Family Medicine

## 2017-08-07 NOTE — Telephone Encounter (Signed)
Where are they going out of the country? Not needed for trips to certain places like [place] or other groups in low risk category  From CDC:  Destinations with high risk: [place], the [place], [place], [place], and [place] and [place].   Destinations with intermediate risk: [name], [place], and some [place].   Destinations with low risk: the [place], [place], [place], [place], [place], and [place] and [place].

## 2017-08-07 NOTE — Telephone Encounter (Signed)
° ° °  Pt call to say she and her husband  is going out of the country and is asking an antibiotic CIPRO just in case.

## 2017-08-08 ENCOUNTER — Telehealth: Payer: Self-pay | Admitting: Family Medicine

## 2017-08-08 NOTE — Telephone Encounter (Signed)
° ° ° °  Pt call to say she  would like to have a bone density test

## 2017-08-08 NOTE — Telephone Encounter (Signed)
Ok to place order? Please advise 

## 2017-08-08 NOTE — Telephone Encounter (Signed)
LMTCB to clarify destination

## 2017-08-09 ENCOUNTER — Other Ambulatory Visit: Payer: Self-pay

## 2017-08-09 DIAGNOSIS — Z780 Asymptomatic menopausal state: Secondary | ICD-10-CM

## 2017-08-09 NOTE — Telephone Encounter (Signed)
Order entered for Dexa

## 2017-08-09 NOTE — Telephone Encounter (Signed)
Yes thanks under postmenopausal

## 2017-08-10 NOTE — Telephone Encounter (Signed)
Called patient to clarify where they are traveling and she stated [place], [place], [place], [place]. She is wanting it called in to [facility]. She wants it in case she gets  UTI while traveling. She will be on a boat.

## 2017-08-11 NOTE — Telephone Encounter (Signed)
Typically there are doctors available in case of illness. At her age would want a urine culture if she developed UTI so she should seek care in case of that.

## 2017-08-15 NOTE — Telephone Encounter (Signed)
Patient left for trip on Saturday

## 2017-08-17 ENCOUNTER — Other Ambulatory Visit: Payer: Self-pay | Admitting: Family Medicine

## 2017-08-17 DIAGNOSIS — E2839 Other primary ovarian failure: Secondary | ICD-10-CM

## 2017-08-28 ENCOUNTER — Encounter (INDEPENDENT_AMBULATORY_CARE_PROVIDER_SITE_OTHER): Payer: Self-pay

## 2017-09-03 DIAGNOSIS — R87811 Vaginal high risk human papillomavirus (HPV) DNA test positive: Secondary | ICD-10-CM | POA: Diagnosis not present

## 2017-09-05 ENCOUNTER — Ambulatory Visit (INDEPENDENT_AMBULATORY_CARE_PROVIDER_SITE_OTHER): Payer: Medicare HMO | Admitting: Family Medicine

## 2017-09-05 ENCOUNTER — Encounter: Payer: Self-pay | Admitting: Family Medicine

## 2017-09-05 VITALS — BP 108/70 | HR 97 | Temp 97.5°F | Ht 67.0 in | Wt 252.8 lb

## 2017-09-05 DIAGNOSIS — M1A9XX0 Chronic gout, unspecified, without tophus (tophi): Secondary | ICD-10-CM | POA: Diagnosis not present

## 2017-09-05 DIAGNOSIS — I10 Essential (primary) hypertension: Secondary | ICD-10-CM

## 2017-09-05 DIAGNOSIS — F329 Major depressive disorder, single episode, unspecified: Secondary | ICD-10-CM | POA: Diagnosis not present

## 2017-09-05 DIAGNOSIS — Z23 Encounter for immunization: Secondary | ICD-10-CM | POA: Diagnosis not present

## 2017-09-05 MED ORDER — CLONAZEPAM 0.5 MG PO TABS: 0.5000 mg | ORAL_TABLET | Freq: Every day | ORAL | 1 refills | Status: DC
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
## 2017-09-05 NOTE — Assessment & Plan Note (Signed)
S: on high dose allopurinol 300mg  daily. Denies recent flares. Cherry extract vitamins tend to really help Lab Results  Component Value Date   LABURIC 5.1 01/04/2016  A/P: continue current medicines

## 2017-09-05 NOTE — Patient Instructions (Addendum)
No changes in medication today  Hope you get to feeling better with the right groin  Glad you have a visit with Dr. Leafy Ro  Sent clonazepam to mail order

## 2017-09-05 NOTE — Assessment & Plan Note (Signed)
S: controlled on lisinopril 2.5mg - she has stated primarily renal protective per prior MD BP Readings from Last 3 Encounters:  09/05/17 108/70  06/16/17 100/66  06/13/17 120/62  A/P: We discussed blood pressure goal of <140/90. Continue current meds
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
## 2017-09-11 ENCOUNTER — Ambulatory Visit
Admission: RE | Admit: 2017-09-11 | Discharge: 2017-09-11 | Disposition: A | Discharge status: Home / Self Care | Payer: Medicare HMO | Source: Ambulatory Visit | Attending: Family Medicine | Admitting: Family Medicine

## 2017-09-11 DIAGNOSIS — E2839 Other primary ovarian failure: Secondary | ICD-10-CM

## 2017-09-11 DIAGNOSIS — Z13820 Encounter for screening for osteoporosis: Secondary | ICD-10-CM | POA: Diagnosis not present

## 2017-09-11 DIAGNOSIS — Z780 Asymptomatic menopausal state: Secondary | ICD-10-CM | POA: Diagnosis not present

## 2017-09-18 ENCOUNTER — Ambulatory Visit (INDEPENDENT_AMBULATORY_CARE_PROVIDER_SITE_OTHER): Payer: Medicare HMO | Admitting: Family Medicine

## 2017-09-18 ENCOUNTER — Encounter (INDEPENDENT_AMBULATORY_CARE_PROVIDER_SITE_OTHER): Payer: Self-pay | Admitting: Family Medicine

## 2017-09-18 VITALS — BP 101/65 | HR 72 | Temp 97.5°F | Ht 68.0 in | Wt 249.0 lb

## 2017-09-18 DIAGNOSIS — E7849 Other hyperlipidemia: Secondary | ICD-10-CM

## 2017-09-18 DIAGNOSIS — Z0289 Encounter for other administrative examinations: Secondary | ICD-10-CM

## 2017-09-18 DIAGNOSIS — Z6838 Body mass index (BMI) 38.0-38.9, adult: Secondary | ICD-10-CM | POA: Diagnosis not present

## 2017-09-18 DIAGNOSIS — Z1331 Encounter for screening for depression: Secondary | ICD-10-CM | POA: Diagnosis not present

## 2017-09-18 DIAGNOSIS — R739 Hyperglycemia, unspecified: Secondary | ICD-10-CM

## 2017-09-18 DIAGNOSIS — Z905 Acquired absence of kidney: Secondary | ICD-10-CM

## 2017-09-18 DIAGNOSIS — R5383 Other fatigue: Secondary | ICD-10-CM | POA: Insufficient documentation

## 2017-09-18 DIAGNOSIS — R0602 Shortness of breath: Secondary | ICD-10-CM | POA: Insufficient documentation

## 2017-09-18 DIAGNOSIS — E559 Vitamin D deficiency, unspecified: Secondary | ICD-10-CM

## 2017-09-18 NOTE — Progress Notes (Signed)
.  Office: [phone]  /  Fax: [phone]   HPI:   Chief Complaint: OBESITY  Angie Velasquez (MR# [id number]) is a 71 y.o. female who presents on 09/18/2017 for obesity evaluation and treatment. Current BMI is Body mass index is 37.86 kg/m.Angie Velasquez Angie Velasquez has struggled with obesity for years and has been unsuccessful in either losing weight or maintaining long term weight loss. Angie Velasquez attended our information session and states she is currently in the action stage of change and ready to dedicate time achieving and maintaining a healthier weight.  Angie Velasquez states her family eats meals together she thinks her family will eat healthier with  her her desired weight loss is 76 lbs she has been heavy most of  her life she started gaining weight at 71 yrs old her heaviest weight ever was 251 lbs. she has significant food cravings issues  she is frequently drinking liquids with calories she has binge eating behaviors she struggles with emotional eating    Fatigue Angie Velasquez feels her energy is lower than it should be. This has worsened with weight gain and has not worsened recently. Angie Velasquez admits to daytime somnolence and denies waking up still tired. Patient is at risk for obstructive sleep apnea. Patent has a history of symptoms of daytime fatigue. Patient generally gets 8 hours of sleep per night, and states they generally have restful sleep. Snoring is present. Apneic episodes are present. Epworth Sleepiness Score is 5  Dyspnea on exertion Angie Velasquez notes increasing shortness of breath with exercising and seems to be worsening over time with weight gain. She notes getting out of breath sooner with activity than she used to. This has not gotten worse recently. Angie Velasquez denies orthopnea.  Vitamin D deficiency Angie Velasquez has a diagnosis of vitamin D deficiency. She is currently taking vit D and admits fatigue but denies nausea, vomiting or muscle weakness.  Hyperlipidemia Angie Velasquez has  hyperlipidemia and is currently on a statin. She is attempting to improve her cholesterol levels with intensive lifestyle modification including a low saturated fat diet, exercise and weight loss. She denies any chest pain, claudication or myalgias.  Hyperglycemia Angie Velasquez has a history of elevated fasting glucose. Her last creatinine was 1.31 and she denies hematuria or flank pain.. She denies polyphagia.  Status Post Nephrectomy in 1992   Depression Screen Angie Velasquez's Food and Mood (modified PHQ-9) score was  Depression screen PHQ 2/9 09/18/2017  Decreased Interest 1  Down, Depressed, Hopeless 1  PHQ - 2 Score 2  Altered sleeping 0  Tired, decreased energy 1  Change in appetite 1  Feeling bad or failure about yourself  1  Trouble concentrating 1  Moving slowly or fidgety/restless 0  Suicidal thoughts 1  PHQ-9 Score 7  Difficult doing work/chores Not difficult at all    ALLERGIES: Allergies  Allergen Reactions  . Epinephrine Palpitations  . Codeine Nausea Only  . Penicillins Nausea Only    MEDICATIONS: Current Outpatient Prescriptions on File Prior to Visit  Medication Sig Dispense Refill  . allopurinol (ZYLOPRIM) 300 MG tablet TAKE 1 TABLET (300 MG TOTAL) 2 TIMES DAILY. 180 tablet 3  . aspirin 81 MG tablet Take 81 mg by mouth daily.    Angie Velasquez CALCIUM-MAGNESIUM-ZINC PO Take by mouth.    . clonazePAM (KLONOPIN) 0.5 MG tablet Take 1 tablet (0.5 mg total) by mouth at bedtime. 90 tablet 1  . lisinopril (PRINIVIL,ZESTRIL) 2.5 MG tablet Take 1 tablet (2.5 mg total) by mouth daily. 90 tablet 3  . OVER THE COUNTER  MEDICATION Cherry extract bid    . OVER THE COUNTER MEDICATION B-12 one daily    . OVER THE COUNTER MEDICATION Vitamin D    . sertraline (ZOLOFT) 50 MG tablet Take 1 tablet (50 mg total) by mouth daily. 90 tablet 3  . simvastatin (ZOCOR) 20 MG tablet TAKE 1 TABLET AT BEDTIME 90 tablet 3  . traMADol (ULTRAM) 50 MG tablet Take 1 tablet (50 mg total) by mouth every 6 (six) hours  as needed for moderate pain or severe pain. 20 tablet 0  . triamcinolone cream (KENALOG) 0.1 % Apply 1 application topically 2 (two) times daily. For 7-10 days for recurring eczema issues 80 g 1   No current facility-administered medications on file prior to visit.     PAST MEDICAL HISTORY: Past Medical History:  Diagnosis Date  . Anxiety    years ago - no longer an issue  . Arthritis    gout  . Cataracts, bilateral   . Complication of anesthesia   . Fracture of pelvis ([place])   . GERD (gastroesophageal reflux disease)   . Hx of chest pain    "anxiety"  . Hyperlipidemia   . INSOMNIA 10/14/2008   in past  . Joint pain   . Kidney problem   . Low blood sugar    eats several smaller meals a day  . Obesity   . Palpitations    Stress related   . Proteinuria    H/O  . Tobacco abuse    quit smoking 08/2013    PAST SURGICAL HISTORY: Past Surgical History:  Procedure Laterality Date  . ABDOMINAL HYSTERECTOMY    . APPENDECTOMY     with hysterectomy  . childbirth     x4  . CHOLECYSTECTOMY  2001  . EYE SURGERY Bilateral 2015   cataract surgery with lens implant  . NEPHRECTOMY  1992   complex cystic mass - nephrectomy right  . ORIF ANKLE FRACTURE Right 07/02/2014   Procedure: OPEN REDUCTION INTERNAL FIXATION (ORIF) RIGHT ANKLE BIMALLOELAR FRACTURE;  Surgeon: Wylene Simmer, MD;  Location: [facility];  Service: Orthopedics;  Laterality: Right;  . OTHER SURGICAL HISTORY     hysterectomy, R ovary remains  . SHOULDER SURGERY     bone spurs left    SOCIAL HISTORY: Social History  Substance Use Topics  . Smoking status: Former Smoker    Packs/day: 1.00    Years: 35.00    Types: Cigarettes    Quit date: 09/01/2013  . Smokeless tobacco: Never Used     Comment: Former smoker with no desire to smoke again. Smoking years edited at patients request to include periods when she did not smoke.  Angie Velasquez Alcohol use 0.6 oz/week    1 Shots of liquor per week    FAMILY HISTORY: Family History    Problem Relation Age of Onset  . Stroke Father   . Alzheimer's disease Father        early, states also parkinsons. died 59  . Hypertension Father   . Obesity Father   . Ovarian cancer Mother        Dr. Jana Hakim   . Arthritis Mother   . Obesity Mother   . Colon cancer Paternal Grandmother   . Liver disease Daughter        On hospice age 11    ROS: Review of Systems  Constitutional: Positive for malaise/fatigue.  HENT: Positive for hearing loss and tinnitus.   Eyes:       Wear Glasses  or Contacts  Respiratory: Positive for shortness of breath (on exertion).   Cardiovascular: Negative for chest pain and claudication.  Gastrointestinal: Positive for heartburn. Negative for nausea and vomiting.  Genitourinary: Negative for flank pain and hematuria.  Musculoskeletal: Negative for myalgias.       Negative muscle weakness  Skin: Positive for rash (excema).  Endo/Heme/Allergies:       Negative polyphagia   Psychiatric/Behavioral: Positive for depression. The patient is nervous/anxious (nervousness).        Stress     PHYSICAL EXAM: Blood pressure 101/65, pulse 72, temperature (!) 97.5 F (36.4 C), temperature source Oral, height 5\' 8"  (1.727 m), weight 249 lb (112.9 kg), SpO2 95 %. Body mass index is 37.86 kg/m. Physical Exam  Constitutional: She is oriented to person, place, and time. She appears well-developed and well-nourished.  Cardiovascular: Normal rate.   Pulmonary/Chest: Effort normal.  Musculoskeletal: Normal range of motion.  Neurological: She is oriented to person, place, and time.  Skin: Skin is warm and dry.  Psychiatric: She has a normal mood and affect. Her behavior is normal.  Vitals reviewed.   RECENT LABS AND TESTS: BMET    Component Value Date/Time   NA 141 01/18/2017 0902   K 3.9 01/18/2017 0902   CL 105 01/18/2017 0902   CO2 28 01/18/2017 0902   GLUCOSE 109 (H) 01/18/2017 0902   BUN 21 01/18/2017 0902   CREATININE 1.31 (H) 01/18/2017 0902    CALCIUM 9.1 01/18/2017 0902   GFRNONAA 52 (L) 07/02/2014 1143   GFRAA 61 (L) 07/02/2014 1143   Lab Results  Component Value Date   HGBA1C (H) 04/15/2010    5.9 (NOTE)                                                                       According to the ADA Clinical Practice Recommendations for 2011, when HbA1c is used as a screening test:   >=6.5%   Diagnostic of Diabetes Mellitus           (if abnormal result  is confirmed)  5.7-6.4%   Increased risk of developing Diabetes Mellitus  References:Diagnosis and Classification of Diabetes Mellitus,Diabetes QPYP,9509,32(IZTIW 1):S62-S69 and Standards of Medical Care in         Diabetes - 2011,Diabetes Care,2011,34  (Suppl 1):S11-S61.   No results found for: INSULIN CBC    Component Value Date/Time   WBC 6.6 01/18/2017 0902   RBC 4.39 01/18/2017 0902   HGB 12.9 01/18/2017 0902   HCT 39.0 01/18/2017 0902   PLT 240.0 01/18/2017 0902   MCV 88.7 01/18/2017 0902   MCH 29.4 07/02/2014 1143   MCHC 33.2 01/18/2017 0902   RDW 15.7 (H) 01/18/2017 0902   LYMPHSABS 3.3 04/15/2010 2135   MONOABS 0.9 04/15/2010 2135   EOSABS 0.2 04/15/2010 2135   BASOSABS 0.1 04/15/2010 2135   Iron/TIBC/Ferritin/ %Sat No results found for: IRON, TIBC, FERRITIN, IRONPCTSAT Lipid Panel     Component Value Date/Time   CHOL 167 01/18/2017 0902   TRIG 141.0 01/18/2017 0902   HDL 69.80 01/18/2017 0902   CHOLHDL 2 01/18/2017 0902   VLDL 28.2 01/18/2017 0902   LDLCALC 69 01/18/2017 0902   LDLDIRECT 77.0 01/18/2017 0902   Hepatic Function Panel  Component Value Date/Time   PROT 6.4 01/18/2017 0902   ALBUMIN 4.3 01/18/2017 0902   AST 23 01/18/2017 0902   ALT 28 01/18/2017 0902   ALKPHOS 76 01/18/2017 0902   BILITOT 0.3 01/18/2017 0902   BILIDIR <0.1 04/15/2010 2135   IBILI NOT CALCULATED 04/15/2010 2135      Component Value Date/Time   TSH 2.09 05/25/2015 0820   TSH 1.89 12/31/2007 0831    ECG  shows NSR with a rate of 78 BPM INDIRECT  CALORIMETER done today shows a VO2 of 163 and a REE of 1136. Her calculated basal metabolic rate is 5284 thus her basal metabolic rate is worse than expected.    ASSESSMENT AND PLAN: Other fatigue - Plan: EKG 12-Lead, CBC With Differential, T3, T4, free, TSH  Shortness of breath on exertion  Other hyperlipidemia - Plan: Lipid Panel With LDL/HDL Ratio  Vitamin D deficiency - Plan: VITAMIN D 25 Hydroxy (Vit-D Deficiency, Fractures)  Hyperglycemia - Plan: Hemoglobin A1c, Insulin, random  S/p nephrectomy - Plan: Comprehensive metabolic panel  Depression screening  Class 2 severe obesity with serious comorbidity and body mass index (BMI) of 38.0 to 38.9 in adult, unspecified obesity type (HCC)  PLAN:  Fatigue Moesha was informed that her fatigue may be related to obesity, depression or many other causes. Labs will be ordered, and in the meanwhile Fia has agreed to work on diet, exercise and weight loss to help with fatigue. Proper sleep hygiene was discussed including the need for 7-8 hours of quality sleep each night. A sleep study was not ordered based on symptoms and Epworth score.  Dyspnea on exertion Dion's shortness of breath appears to be obesity related and exercise induced. She has agreed to work on weight loss and gradually increase exercise to treat her exercise induced shortness of breath. If Teniqua follows our instructions and loses weight without improvement of her shortness of breath, we will plan to refer to pulmonology. We will monitor this condition regularly. Alaja agrees to this plan.  Vitamin D Deficiency Justine was informed that low vitamin D levels contributes to fatigue and are associated with obesity, breast, and colon cancer. She agrees to continue to take OTC vitamin D for now. We will check labs and will follow up for routine testing of vitamin D, at least 2-3 times per year. She was informed of the risk of over-replacement of vitamin D and agrees to  not increase her dose unless he discusses this with [name] first. Mazzy agrees to follow up with our clinic in 2 weeks.  Hyperlipidemia Kruti was informed of the American Heart Association Guidelines emphasizing intensive lifestyle modifications as the first line treatment for hyperlipidemia. We discussed many lifestyle modifications today in depth, and Ariadna will continue to work on decreasing saturated fats such as fatty red meat, butter and many fried foods. She will also increase vegetables and lean protein in her diet and continue to work on exercise and weight loss efforts. We will check labs and Marlette agrees to follow up with our clinic in 2 weeks.  Hyperglycemia Fasting labs will be obtained and results with be discussed with Angie Velasquez in 2 weeks at her follow up visit. In the meanwhile Florance was started on a lower simple carbohydrate diet and will work on weight loss efforts. We will factor this into her dietary guidelines  Depression Screen Vianna had a mildly positive depression screening. Depression is commonly associated with obesity and often results in emotional eating behaviors. We will monitor  this closely and work on CBT to help improve the non-hunger eating patterns. Referral to Psychology may be required if no improvement is seen as she continues in our clinic.  Obesity Jansen is currently in the action stage of change and her goal is to continue with weight loss efforts She has agreed to follow the Category 2 plan Azara has been instructed to work up to a goal of 150 minutes of combined cardio and strengthening exercise per week for weight loss and overall health benefits. We discussed the following Behavioral Modification Strategies today: increasing lean protein intake, decreasing simple carbohydrates and decrease eating out  Cherl has agreed to follow up with our clinic in 2 weeks. She was informed of the importance of frequent follow up visits to maximize her  success with intensive lifestyle modifications for her multiple health conditions. She was informed we would discuss her lab results at her next visit unless there is a critical issue that needs to be addressed sooner. Joeline agreed to keep her next visit at the agreed upon time to discuss these results.  I, Doreene Nest, am acting as transcriptionist for Dennard Nip, MD  I have reviewed the above documentation for accuracy and completeness, and I agree with the above. -Dennard Nip, MD  OBESITY BEHAVIORAL INTERVENTION VISIT  Today's visit was # 1 out of 9.  Starting weight: 249 lbs Starting date: 09/18/17 Today's weight : 249 lbs Today's date: 09/18/2017 Total lbs lost to date: 0 (Patients must lose 7 lbs in the first 6 months to continue with counseling)   ASK: We discussed the diagnosis of obesity with [name] today and Relena agreed to give [name] permission to discuss obesity behavioral modification therapy today.  ASSESS: Oksana has the diagnosis of obesity and her BMI today is 37.87 Niyla is in the action stage of change   ADVISE: Berlene was educated on the multiple health risks of obesity as well as the benefit of weight loss to improve her health. She was advised of the need for long term treatment and the importance of lifestyle modifications.  AGREE: Multiple dietary modification options and treatment options were discussed and  Tola agreed to follow the Category 2 plan We discussed the following Behavioral Modification Strategies today: increasing lean protein intake, decreasing simple carbohydrates and decrease eating out

## 2017-09-19 LAB — COMPREHENSIVE METABOLIC PANEL
ALBUMIN: 4.7 g/dL (ref 3.5–4.8)
ALK PHOS: 92 IU/L (ref 39–117)
ALT: 42 IU/L — ABNORMAL HIGH (ref 0–32)
AST: 35 IU/L (ref 0–40)
Albumin/Globulin Ratio: 2.6 — ABNORMAL HIGH (ref 1.2–2.2)
BUN / CREAT RATIO: 13 (ref 12–28)
BUN: 16 mg/dL (ref 8–27)
Bilirubin Total: 0.3 mg/dL (ref 0.0–1.2)
CALCIUM: 9.5 mg/dL (ref 8.7–10.3)
CO2: 23 mmol/L (ref 20–29)
CREATININE: 1.19 mg/dL — AB (ref 0.57–1.00)
Chloride: 98 mmol/L (ref 96–106)
GFR calc Af Amer: 53 mL/min/{1.73_m2} — ABNORMAL LOW (ref >59)
GFR, EST NON AFRICAN AMERICAN: 46 mL/min/{1.73_m2} — AB (ref >59)
GLUCOSE: 96 mg/dL (ref 65–99)
Globulin, Total: 1.8 g/dL (ref 1.5–4.5)
Potassium: 4.2 mmol/L (ref 3.5–5.2)
Sodium: 138 mmol/L (ref 134–144)
Total Protein: 6.5 g/dL (ref 6.0–8.5)

## 2017-09-19 LAB — CBC WITH DIFFERENTIAL
BASOS ABS: 0 10*3/uL (ref 0.0–0.2)
Basos: 1 %
EOS (ABSOLUTE): 0.2 10*3/uL (ref 0.0–0.4)
EOS: 3 %
HEMATOCRIT: 40.4 % (ref 34.0–46.6)
HEMOGLOBIN: 13.5 g/dL (ref 11.1–15.9)
IMMATURE GRANS (ABS): 0 10*3/uL (ref 0.0–0.1)
IMMATURE GRANULOCYTES: 0 %
LYMPHS ABS: 2.6 10*3/uL (ref 0.7–3.1)
LYMPHS: 41 %
MCH: 29.5 pg (ref 26.6–33.0)
MCHC: 33.4 g/dL (ref 31.5–35.7)
MCV: 88 fL (ref 79–97)
MONOCYTES: 8 %
Monocytes Absolute: 0.5 10*3/uL (ref 0.1–0.9)
Neutrophils Absolute: 3.1 10*3/uL (ref 1.4–7.0)
Neutrophils: 47 %
RBC: 4.57 x10E6/uL (ref 3.77–5.28)
RDW: 15.2 % (ref 12.3–15.4)
WBC: 6.4 10*3/uL (ref 3.4–10.8)

## 2017-09-19 LAB — LIPID PANEL WITH LDL/HDL RATIO
CHOLESTEROL TOTAL: 161 mg/dL (ref 100–199)
HDL: 58 mg/dL (ref >39)
LDL CALC: 56 mg/dL (ref 0–99)
LDl/HDL Ratio: 1 ratio (ref 0.0–3.2)
TRIGLYCERIDES: 233 mg/dL — AB (ref 0–149)
VLDL Cholesterol Cal: 47 mg/dL — ABNORMAL HIGH (ref 5–40)

## 2017-09-19 LAB — T3: T3, Total: 152 ng/dL (ref 71–180)

## 2017-09-19 LAB — T4, FREE: Free T4: 1.06 ng/dL (ref 0.82–1.77)

## 2017-09-19 LAB — VITAMIN D 25 HYDROXY (VIT D DEFICIENCY, FRACTURES): Vit D, 25-Hydroxy: 45.6 ng/mL (ref 30.0–100.0)

## 2017-09-19 LAB — HEMOGLOBIN A1C
ESTIMATED AVERAGE GLUCOSE: 117 mg/dL
HEMOGLOBIN A1C: 5.7 % — AB (ref 4.8–5.6)

## 2017-09-19 LAB — TSH: TSH: 3.43 u[IU]/mL (ref 0.450–4.500)

## 2017-09-19 LAB — INSULIN, RANDOM: INSULIN: 25.3 u[IU]/mL — ABNORMAL HIGH (ref 2.6–24.9)

## 2017-09-21 ENCOUNTER — Other Ambulatory Visit: Payer: Self-pay

## 2017-09-21 ENCOUNTER — Telehealth: Payer: Self-pay | Admitting: Family Medicine

## 2017-09-21 MED ORDER — ALLOPURINOL 300 MG PO TABS: ORAL_TABLET | ORAL | 3 refills | Status: DC

## 2017-09-21 NOTE — Telephone Encounter (Signed)
MEDICATION:  clonazePAM (KLONOPIN) 0.5 MG tablet PHARMACY:   [facility] [phone] ([phone] (Fax)    IS THIS A 90 DAY SUPPLY : Y  IS PATIENT OUT OF MEDICATION: N  IF NOT; HOW MUCH IS LEFT: 5 pills left  LAST APPOINTMENT DATE: @10 /02/2017  NEXT APPOINTMENT DATE:@2 /06/2018  OTHER COMMENTS:    **Let patient know to contact pharmacy at the end of the day to make sure medication is ready. **  ** Please notify patient to allow 48-72 hours to process**  **Encourage patient to contact the pharmacy for refills or they can request refills through [facility]**

## 2017-09-21 NOTE — Telephone Encounter (Signed)
Prescription re faxed to pharmacy. Informed patient it had originally been faxed on 09/05/17. I also sent in a prescription refill for allopurinol. Patient aware.

## 2017-10-02 ENCOUNTER — Ambulatory Visit (INDEPENDENT_AMBULATORY_CARE_PROVIDER_SITE_OTHER): Payer: Medicare HMO | Admitting: Family Medicine

## 2017-10-02 VITALS — BP 111/72 | HR 95 | Temp 97.5°F | Ht 68.0 in | Wt 249.0 lb

## 2017-10-02 DIAGNOSIS — Z6837 Body mass index (BMI) 37.0-37.9, adult: Secondary | ICD-10-CM

## 2017-10-02 DIAGNOSIS — R7303 Prediabetes: Secondary | ICD-10-CM | POA: Diagnosis not present

## 2017-10-02 DIAGNOSIS — F3289 Other specified depressive episodes: Secondary | ICD-10-CM

## 2017-10-02 MED ORDER — BUPROPION HCL ER (SR) 150 MG PO TB12: 150.0000 mg | ORAL_TABLET | Freq: Every day | ORAL | 0 refills | Status: DC

## 2017-10-02 NOTE — Progress Notes (Signed)
Office: [phone]  /  Fax: [phone]   HPI:   Chief Complaint: OBESITY Angie Velasquez is here to discuss her progress with her obesity treatment plan. She is on the Category 2 plan and is following her eating plan approximately 50 % of the time. She states she is exercising 0 minutes 0 times per week. [name] struggled to follow the plan, she traveled a lot and wasn't always in charge of her food choices. She is ready to back on track now. Her husband has Alzheimer's and she struggles greatly as hi caregiver.  Her weight is 249 lb (112.9 kg) today and has not lost weight since her last visit. She has lost 0 lbs since starting treatment with [name].  Pre-Diabetes Angie Velasquez has a diagnosis of pre-diabetes based on her elevated Hgb A1c and insulin. She was informed this puts her at greater risk of developing diabetes. She is not taking metformin currently and continues to work on diet and exercise to decrease risk of diabetes. Angie Velasquez has 1 kidney and she notes polyphagia but denies nausea or hypoglycemia.  Depression with emotional eating behaviors Angie Velasquez is on Zoloft but she still struggles with depression and emotional eating and using food for comfort to the extent that it is negatively impacting her health. She often snacks when she is not hungry. Angie Velasquez sometimes feels she is out of control and then feels guilty that she made poor food choices. She has been working on behavior modification techniques to help reduce her emotional eating and has been somewhat successful. She shows no sign of suicidal or homicidal ideations.  Depression screen [facility] 2/9 09/18/2017 09/05/2017 03/26/2017 03/26/2017 03/14/2016  Decreased Interest 1 2 0 0 0  Down, Depressed, Hopeless 1 2 1  0 0  PHQ - 2 Score 2 4 1  0 0  Altered sleeping 0 0 - - -  Tired, decreased energy 1 1 - - -  Change in appetite 1 0 - - -  Feeling bad or failure about yourself  1 0 - - -  Trouble concentrating 1 0 - - -  Moving slowly or  fidgety/restless 0 0 - - -  Suicidal thoughts 1 0 - - -  PHQ-9 Score 7 5 - - -  Difficult doing work/chores Not difficult at all Somewhat difficult - - -   ALLERGIES: Allergies  Allergen Reactions  . Epinephrine Palpitations  . Codeine Nausea Only  . Penicillins Nausea Only    MEDICATIONS: Current Outpatient Prescriptions on File Prior to Visit  Medication Sig Dispense Refill  . acetaminophen (TYLENOL) 500 MG tablet Take 500 mg by mouth every 6 (six) hours as needed.    [name] allopurinol (ZYLOPRIM) 300 MG tablet TAKE 1 TABLET (300 MG TOTAL) 2 TIMES DAILY. 180 tablet 3  . aspirin 81 MG tablet Take 81 mg by mouth daily.    [name] CALCIUM-MAGNESIUM-ZINC PO Take by mouth.    . clonazePAM (KLONOPIN) 0.5 MG tablet Take 1 tablet (0.5 mg total) by mouth at bedtime. 90 tablet 1  . diazepam (VALIUM) 10 MG tablet Take 10 mg by mouth every 6 (six) hours as needed (as needed for muscle spasms).    [name] lisinopril (PRINIVIL,ZESTRIL) 2.5 MG tablet Take 1 tablet (2.5 mg total) by mouth daily. 90 tablet 3  . OVER THE COUNTER MEDICATION Cherry extract bid    . OVER THE COUNTER MEDICATION B-12 one daily    . OVER THE COUNTER MEDICATION Vitamin D    . sertraline (ZOLOFT) 50 MG tablet Take  1 tablet (50 mg total) by mouth daily. 90 tablet 3  . simvastatin (ZOCOR) 20 MG tablet TAKE 1 TABLET AT BEDTIME 90 tablet 3  . Sodium Bicarbonate (NICE PURE BAKING SODA) POWD by Does not apply route.    . traMADol (ULTRAM) 50 MG tablet Take 1 tablet (50 mg total) by mouth every 6 (six) hours as needed for moderate pain or severe pain. 20 tablet 0  . triamcinolone cream (KENALOG) 0.1 % Apply 1 application topically 2 (two) times daily. For 7-10 days for recurring eczema issues 80 g 1   No current facility-administered medications on file prior to visit.     PAST MEDICAL HISTORY: Past Medical History:  Diagnosis Date  . Anxiety    years ago - no longer an issue  . Arthritis    gout  . Cataracts, bilateral   . Complication  of anesthesia   . Fracture of pelvis ([place])   . GERD (gastroesophageal reflux disease)   . Hx of chest pain    "anxiety"  . Hyperlipidemia   . INSOMNIA 10/14/2008   in past  . Joint pain   . Kidney problem   . Low blood sugar    eats several smaller meals a day  . Obesity   . Palpitations    Stress related   . Proteinuria    H/O  . Tobacco abuse    quit smoking 08/2013    PAST SURGICAL HISTORY: Past Surgical History:  Procedure Laterality Date  . ABDOMINAL HYSTERECTOMY    . APPENDECTOMY     with hysterectomy  . childbirth     x4  . CHOLECYSTECTOMY  2001  . EYE SURGERY Bilateral 2015   cataract surgery with lens implant  . NEPHRECTOMY  1992   complex cystic mass - nephrectomy right  . ORIF ANKLE FRACTURE Right 07/02/2014   Procedure: OPEN REDUCTION INTERNAL FIXATION (ORIF) RIGHT ANKLE BIMALLOELAR FRACTURE;  Surgeon: Wylene Simmer, MD;  Location: [place];  Service: Orthopedics;  Laterality: Right;  . OTHER SURGICAL HISTORY     hysterectomy, R ovary remains  . SHOULDER SURGERY     bone spurs left    SOCIAL HISTORY: Social History  Substance Use Topics  . Smoking status: Former Smoker    Packs/day: 1.00    Years: 35.00    Types: Cigarettes    Quit date: 09/01/2013  . Smokeless tobacco: Never Used     Comment: Former smoker with no desire to smoke again. Smoking years edited at patients request to include periods when she did not smoke.  [name] Alcohol use 0.6 oz/week    1 Shots of liquor per week    FAMILY HISTORY: Family History  Problem Relation Age of Onset  . Stroke Father   . Alzheimer's disease Father        early, states also parkinsons. died 70  . Hypertension Father   . Obesity Father   . Ovarian cancer Mother        Dr. Jana Hakim   . Arthritis Mother   . Obesity Mother   . Colon cancer Paternal Grandmother   . Liver disease Daughter        On hospice age 74    ROS: Review of Systems  Constitutional: Negative for weight loss.  Gastrointestinal:  Negative for nausea.  Endo/Heme/Allergies:       Positive polyphagia Negative hypoglycemia  Psychiatric/Behavioral: Positive for depression. Negative for suicidal ideas.    PHYSICAL EXAM: Blood pressure 111/72, pulse 95, temperature [name])  97.5 F (36.4 C), temperature source Oral, height 5\' 8"  (1.727 m), weight 249 lb (112.9 kg), SpO2 97 %. Body mass index is 37.86 kg/m. Physical Exam  Constitutional: She is oriented to person, place, and time. She appears well-developed and well-nourished.  Cardiovascular: Normal rate.   Pulmonary/Chest: Effort normal.  Musculoskeletal: Normal range of motion.  Neurological: She is oriented to person, place, and time.  Skin: Skin is warm and dry.  Psychiatric: She has a normal mood and affect.  Vitals reviewed.   RECENT LABS AND TESTS: BMET    Component Value Date/Time   NA 138 09/18/2017 1049   K 4.2 09/18/2017 1049   CL 98 09/18/2017 1049   CO2 23 09/18/2017 1049   GLUCOSE 96 09/18/2017 1049   GLUCOSE 109 (H) 01/18/2017 0902   BUN 16 09/18/2017 1049   CREATININE 1.19 (H) 09/18/2017 1049   CALCIUM 9.5 09/18/2017 1049   GFRNONAA 46 (L) 09/18/2017 1049   GFRAA 53 (L) 09/18/2017 1049   Lab Results  Component Value Date   HGBA1C 5.7 (H) 09/18/2017   HGBA1C (H) 04/15/2010    5.9 (NOTE)                                                                       According to the ADA Clinical Practice Recommendations for 2011, when HbA1c is used as a screening test:   >=6.5%   Diagnostic of Diabetes Mellitus           (if abnormal result  is confirmed)  5.7-6.4%   Increased risk of developing Diabetes Mellitus  References:Diagnosis and Classification of Diabetes Mellitus,Diabetes NUUV,2536,64(QIHKV 1):S62-S69 and Standards of Medical Care in         Diabetes - 2011,Diabetes Care,2011,34  (Suppl 1):S11-S61.   HGBA1C 5.8 12/31/2007   Lab Results  Component Value Date   INSULIN 25.3 (H) 09/18/2017   CBC    Component Value Date/Time   WBC 6.4  09/18/2017 1049   WBC 6.6 01/18/2017 0902   RBC 4.57 09/18/2017 1049   RBC 4.39 01/18/2017 0902   HGB 13.5 09/18/2017 1049   HCT 40.4 09/18/2017 1049   PLT 240.0 01/18/2017 0902   MCV 88 09/18/2017 1049   MCH 29.5 09/18/2017 1049   MCH 29.4 07/02/2014 1143   MCHC 33.4 09/18/2017 1049   MCHC 33.2 01/18/2017 0902   RDW 15.2 09/18/2017 1049   LYMPHSABS 2.6 09/18/2017 1049   MONOABS 0.9 04/15/2010 2135   EOSABS 0.2 09/18/2017 1049   BASOSABS 0.0 09/18/2017 1049   Iron/TIBC/Ferritin/ %Sat No results found for: IRON, TIBC, FERRITIN, IRONPCTSAT Lipid Panel     Component Value Date/Time   CHOL 161 09/18/2017 1049   TRIG 233 (H) 09/18/2017 1049   HDL 58 09/18/2017 1049   CHOLHDL 2 01/18/2017 0902   VLDL 28.2 01/18/2017 0902   LDLCALC 56 09/18/2017 1049   LDLDIRECT 77.0 01/18/2017 0902   Hepatic Function Panel     Component Value Date/Time   PROT 6.5 09/18/2017 1049   ALBUMIN 4.7 09/18/2017 1049   AST 35 09/18/2017 1049   ALT 42 (H) 09/18/2017 1049   ALKPHOS 92 09/18/2017 1049   BILITOT 0.3 09/18/2017 1049   BILIDIR <0.1 04/15/2010 2135   IBILI NOT  CALCULATED 04/15/2010 2135      Component Value Date/Time   TSH 3.430 09/18/2017 1049   TSH 2.09 05/25/2015 0820   TSH 1.89 12/31/2007 0831    ASSESSMENT AND PLAN: Prediabetes  Other depression - with emotional eating  Class 2 severe obesity with serious comorbidity and body mass index (BMI) of 37.0 to 37.9 in adult, unspecified obesity type ([place])  PLAN:  Pre-Diabetes Ahja will continue to work on weight loss, diet, exercise, and decreasing simple carbohydrates in her diet to help decrease the risk of diabetes. We dicussed metformin including benefits and risks. She was informed that eating too many simple carbohydrates or too many calories at one sitting increases the likelihood of GI side effects. Deletha declined metformin due to chronic renal insufficiency and a prescription was not written today. Lanitra agrees to  follow up with our clinic in 2 weeks as directed to monitor her progress.  Depression with Emotional Eating Behaviors We discussed behavior modification techniques today to help Erion deal with her emotional eating and depression. Tamsyn agrees to start Wellbutrin SR 150 mg q AM #30 with no refills. Hetvi will continue Zoloft as prescribed and we will follow closely. Shamekia agrees to follow up with our clinic in 2 weeks.  Obesity Tatayana is currently in the action stage of change. As such, her goal is to continue with weight loss efforts She has agreed to follow the Category 2 plan Renleigh has been instructed to work up to a goal of 150 minutes of combined cardio and strengthening exercise per week for weight loss and overall health benefits. We discussed the following Behavioral Modification Strategies today: increasing lean protein intake, decreasing simple carbohydrates, work on meal planning and easy cooking plans, holiday eating strategies, emotional eating strategies, and planning for success   Virgen has agreed to follow up with our clinic in 2 weeks. She was informed of the importance of frequent follow up visits to maximize her success with intensive lifestyle modifications for her multiple health conditions.  I, Trixie Dredge, am acting as transcriptionist for Dennard Nip, MD  I have reviewed the above documentation for accuracy and completeness, and I agree with the above. -Dennard Nip, MD      Today's visit was # 2 out of 52.  Starting weight: 249 lbs Starting date: 09/18/17 Today's weight : 249 lbs  Today's date: 10/02/2017 Total lbs lost to date: 0 (Patients must lose 7 lbs in the first 6 months to continue with counseling)   ASK: We discussed the diagnosis of obesity with [name] today and Eydie agreed to give [name] permission to discuss obesity behavioral modification therapy today.  ASSESS: Lashawnna has the diagnosis of obesity and her  BMI today is 37.87 Rashelle is in the action stage of change   ADVISE: Keayra was educated on the multiple health risks of obesity as well as the benefit of weight loss to improve her health. She was advised of the need for long term treatment and the importance of lifestyle modifications.  AGREE: Multiple dietary modification options and treatment options were discussed and  Kisha agreed to follow the Category 2 plan We discussed the following Behavioral Modification Strategies today: increasing lean protein intake, decreasing simple carbohydrates, work on meal planning and easy cooking plans, holiday eating strategies, emotional eating strategies, and planning for success

## 2017-10-12 ENCOUNTER — Encounter: Payer: Self-pay | Admitting: Family Medicine

## 2017-10-15 ENCOUNTER — Encounter: Payer: Self-pay | Admitting: Family Medicine

## 2017-10-16 ENCOUNTER — Encounter (INDEPENDENT_AMBULATORY_CARE_PROVIDER_SITE_OTHER): Payer: Self-pay | Admitting: Family Medicine

## 2017-10-16 NOTE — Telephone Encounter (Signed)
Please R/S

## 2017-10-22 ENCOUNTER — Ambulatory Visit (INDEPENDENT_AMBULATORY_CARE_PROVIDER_SITE_OTHER): Payer: Medicare HMO | Admitting: Family Medicine

## 2017-10-22 VITALS — BP 109/73 | HR 76 | Temp 97.9°F | Ht 68.0 in | Wt 243.0 lb

## 2017-10-22 DIAGNOSIS — R7303 Prediabetes: Secondary | ICD-10-CM | POA: Diagnosis not present

## 2017-10-22 DIAGNOSIS — Z9189 Other specified personal risk factors, not elsewhere classified: Secondary | ICD-10-CM | POA: Diagnosis not present

## 2017-10-22 DIAGNOSIS — Z6837 Body mass index (BMI) 37.0-37.9, adult: Secondary | ICD-10-CM

## 2017-10-22 DIAGNOSIS — F3289 Other specified depressive episodes: Secondary | ICD-10-CM

## 2017-10-22 MED ORDER — BUPROPION HCL ER (SR) 150 MG PO TB12: 150.0000 mg | ORAL_TABLET | Freq: Every day | ORAL | 0 refills | Status: DC

## 2017-10-22 NOTE — Progress Notes (Signed)
Office: [phone]  /  Fax: [phone]   HPI:   Chief Complaint: OBESITY Angie Velasquez is here to discuss her progress with her obesity treatment plan. She is on the Category 2 plan and is following her eating plan approximately 95 % of the time. She states she is exercising 0 minutes 0 times per week. Angie Velasquez continues to do well with weight loss. She is doing better with emotional eating and decreasing sugar cravings. Angie Velasquez is going on an 11 day cruise and would like to discuss eating strategies. Her weight is 243 lb (110.2 kg) today and has had a weight loss of 6 pounds over a period of 3 weeks since her last visit. She has lost 6 lbs since starting treatment with [name].  Pre-Diabetes Angie Velasquez has a diagnosis of pre-diabetes based on her elevated Hgb A1c and was informed this puts her at greater risk of developing diabetes. She is not taking metformin currently and is doing well with diet and weight loss and decreasing simple carbohydrates. She continues to work on diet and exercise to decrease risk of diabetes. She denies nausea or hypoglycemia.  Depression with emotional eating behaviors Angie Velasquez started Wellbutrin and she feels her mood has improved. Angie Velasquez does have xerostomia and she has increased her H2O intake. Angie Velasquez struggles with emotional eating and using food for comfort to the extent that it is negatively impacting her health. She often snacks when she is not hungry. Angie Velasquez sometimes feels she is out of control and then feels guilty that she made poor food choices. She has been working on behavior modification techniques to help reduce her emotional eating and has been somewhat successful. She shows no sign of suicidal or homicidal ideations.  Depression screen [facility] 2/9 09/18/2017 09/05/2017 03/26/2017 03/26/2017 03/14/2016  Decreased Interest 1 2 0 0 0  Down, Depressed, Hopeless 1 2 1  0 0  PHQ - 2 Score 2 4 1  0 0  Altered sleeping 0 0 - - -  Tired, decreased energy 1 1 - - -    Change in appetite 1 0 - - -  Feeling bad or failure about yourself  1 0 - - -  Trouble concentrating 1 0 - - -  Moving slowly or fidgety/restless 0 0 - - -  Suicidal thoughts 1 0 - - -  PHQ-9 Score 7 5 - - -  Difficult doing work/chores Not difficult at all Somewhat difficult - - -       ALLERGIES: Allergies  Allergen Reactions  . Epinephrine Palpitations  . Codeine Nausea Only  . Penicillins Nausea Only    MEDICATIONS: Current Outpatient Medications on File Prior to Visit  Medication Sig Dispense Refill  . acetaminophen (TYLENOL) 500 MG tablet Take 500 mg by mouth every 6 (six) hours as needed.    [name] allopurinol (ZYLOPRIM) 300 MG tablet TAKE 1 TABLET (300 MG TOTAL) 2 TIMES DAILY. 180 tablet 3  . aspirin 81 MG tablet Take 81 mg by mouth daily.    [name] buPROPion (WELLBUTRIN SR) 150 MG 12 hr tablet Take 1 tablet (150 mg total) by mouth daily. 30 tablet 0  . CALCIUM-MAGNESIUM-ZINC PO Take by mouth.    . clonazePAM (KLONOPIN) 0.5 MG tablet Take 1 tablet (0.5 mg total) by mouth at bedtime. 90 tablet 1  . diazepam (VALIUM) 10 MG tablet Take 10 mg by mouth every 6 (six) hours as needed (as needed for muscle spasms).    [name] lisinopril (PRINIVIL,ZESTRIL) 2.5 MG tablet Take 1 tablet (2.5  mg total) by mouth daily. 90 tablet 3  . OVER THE COUNTER MEDICATION Cherry extract bid    . OVER THE COUNTER MEDICATION B-12 one daily    . OVER THE COUNTER MEDICATION Vitamin D    . sertraline (ZOLOFT) 50 MG tablet Take 1 tablet (50 mg total) by mouth daily. 90 tablet 3  . simvastatin (ZOCOR) 20 MG tablet TAKE 1 TABLET AT BEDTIME 90 tablet 3  . Sodium Bicarbonate (NICE PURE BAKING SODA) POWD by Does not apply route.    . traMADol (ULTRAM) 50 MG tablet Take 1 tablet (50 mg total) by mouth every 6 (six) hours as needed for moderate pain or severe pain. 20 tablet 0  . triamcinolone cream (KENALOG) 0.1 % Apply 1 application topically 2 (two) times daily. For 7-10 days for recurring eczema issues 80 g 1   No  current facility-administered medications on file prior to visit.     PAST MEDICAL HISTORY: Past Medical History:  Diagnosis Date  . Anxiety    years ago - no longer an issue  . Arthritis    gout  . Cataracts, bilateral   . Complication of anesthesia   . Fracture of pelvis ([place])   . GERD (gastroesophageal reflux disease)   . Hx of chest pain    "anxiety"  . Hyperlipidemia   . INSOMNIA 10/14/2008   in past  . Joint pain   . Kidney problem   . Low blood sugar    eats several smaller meals a day  . Obesity   . Palpitations    Stress related   . Proteinuria    H/O  . Tobacco abuse    quit smoking 08/2013    PAST SURGICAL HISTORY: Past Surgical History:  Procedure Laterality Date  . ABDOMINAL HYSTERECTOMY    . APPENDECTOMY     with hysterectomy  . childbirth     x4  . CHOLECYSTECTOMY  2001  . EYE SURGERY Bilateral 2015   cataract surgery with lens implant  . NEPHRECTOMY  1992   complex cystic mass - nephrectomy right  . OPEN REDUCTION INTERNAL FIXATION (ORIF) RIGHT ANKLE BIMALLOELAR FRACTURE Right 07/02/2014   Performed by Wylene Simmer, MD at [facility]     hysterectomy, R ovary remains  . SHOULDER SURGERY     bone spurs left    SOCIAL HISTORY: Social History   Tobacco Use  . Smoking status: Former Smoker    Packs/day: 1.00    Years: 35.00    Pack years: 35.00    Types: Cigarettes    Last attempt to quit: 09/01/2013    Years since quitting: 4.1  . Smokeless tobacco: Never Used  . Tobacco comment: Former smoker with no desire to smoke again. Smoking years edited at patients request to include periods when she did not smoke.  Substance Use Topics  . Alcohol use: Yes    Alcohol/week: 0.6 oz    Types: 1 Shots of liquor per week  . Drug use: No    FAMILY HISTORY: Family History  Problem Relation Age of Onset  . Stroke Father   . Alzheimer's disease Father        early, states also parkinsons. died 36  . Hypertension Father   .  Obesity Father   . Ovarian cancer Mother        Dr. Jana Hakim   . Arthritis Mother   . Obesity Mother   . Colon cancer Paternal Grandmother   .  Liver disease Daughter        On hospice age 38    ROS: Review of Systems  Constitutional: Positive for weight loss.  Gastrointestinal: Negative for nausea.  Endo/Heme/Allergies:       Negative hypoglycemia  Psychiatric/Behavioral: Positive for depression. Negative for suicidal ideas.    PHYSICAL EXAM: Blood pressure 109/73, pulse 76, temperature 97.9 F (36.6 C), temperature source Oral, height 5\' 8"  (1.727 m), weight 243 lb (110.2 kg), SpO2 98 %. Body mass index is 36.95 kg/m. Physical Exam  Constitutional: She is oriented to person, place, and time. She appears well-developed and well-nourished.  Cardiovascular: Normal rate.  Pulmonary/Chest: Effort normal.  Musculoskeletal: Normal range of motion.  Neurological: She is oriented to person, place, and time.  Skin: Skin is warm and dry.  Psychiatric: She has a normal mood and affect. Her behavior is normal.  Vitals reviewed.   RECENT LABS AND TESTS: BMET    Component Value Date/Time   NA 138 09/18/2017 1049   K 4.2 09/18/2017 1049   CL 98 09/18/2017 1049   CO2 23 09/18/2017 1049   GLUCOSE 96 09/18/2017 1049   GLUCOSE 109 (H) 01/18/2017 0902   BUN 16 09/18/2017 1049   CREATININE 1.19 (H) 09/18/2017 1049   CALCIUM 9.5 09/18/2017 1049   GFRNONAA 46 (L) 09/18/2017 1049   GFRAA 53 (L) 09/18/2017 1049   Lab Results  Component Value Date   HGBA1C 5.7 (H) 09/18/2017   HGBA1C (H) 04/15/2010    5.9 (NOTE)                                                                       According to the ADA Clinical Practice Recommendations for 2011, when HbA1c is used as a screening test:   >=6.5%   Diagnostic of Diabetes Mellitus           (if abnormal result  is confirmed)  5.7-6.4%   Increased risk of developing Diabetes Mellitus  References:Diagnosis and Classification of Diabetes  [place],Diabetes HBZJ,6967,89(FYBOF 1):S62-S69 and Standards of Medical Care in         Diabetes - 2011,Diabetes Care,2011,34  (Suppl 1):S11-S61.   HGBA1C 5.8 12/31/2007   Lab Results  Component Value Date   INSULIN 25.3 (H) 09/18/2017   CBC    Component Value Date/Time   WBC 6.4 09/18/2017 1049   WBC 6.6 01/18/2017 0902   RBC 4.57 09/18/2017 1049   RBC 4.39 01/18/2017 0902   HGB 13.5 09/18/2017 1049   HCT 40.4 09/18/2017 1049   PLT 240.0 01/18/2017 0902   MCV 88 09/18/2017 1049   MCH 29.5 09/18/2017 1049   MCH 29.4 07/02/2014 1143   MCHC 33.4 09/18/2017 1049   MCHC 33.2 01/18/2017 0902   RDW 15.2 09/18/2017 1049   LYMPHSABS 2.6 09/18/2017 1049   MONOABS 0.9 04/15/2010 2135   EOSABS 0.2 09/18/2017 1049   BASOSABS 0.0 09/18/2017 1049   Iron/TIBC/Ferritin/ %Sat No results found for: IRON, TIBC, FERRITIN, IRONPCTSAT Lipid Panel     Component Value Date/Time   CHOL 161 09/18/2017 1049   TRIG 233 (H) 09/18/2017 1049   HDL 58 09/18/2017 1049   CHOLHDL 2 01/18/2017 0902   VLDL 28.2 01/18/2017 0902   LDLCALC 56 09/18/2017 1049  LDLDIRECT 77.0 01/18/2017 0902   Hepatic Function Panel     Component Value Date/Time   PROT 6.5 09/18/2017 1049   ALBUMIN 4.7 09/18/2017 1049   AST 35 09/18/2017 1049   ALT 42 (H) 09/18/2017 1049   ALKPHOS 92 09/18/2017 1049   BILITOT 0.3 09/18/2017 1049   BILIDIR <0.1 04/15/2010 2135   IBILI NOT CALCULATED 04/15/2010 2135      Component Value Date/Time   TSH 3.430 09/18/2017 1049   TSH 2.09 05/25/2015 0820   TSH 1.89 12/31/2007 0831    ASSESSMENT AND PLAN: Prediabetes  Other depression - with emotional eating  Class 2 severe obesity with serious comorbidity and body mass index (BMI) of 37.0 to 37.9 in adult, unspecified obesity type ([place])  PLAN:  Pre-Diabetes Tanairi will continue to work on weight loss, exercise, and decreasing simple carbohydrates in her diet to help decrease the risk of diabetes. We dicussed metformin  including benefits and risks. She was informed that eating too many simple carbohydrates or too many calories at one sitting increases the likelihood of GI side effects. We will recheck labs in 1 month and Tina agreed to follow up with [name] as directed to monitor her progress.  Depression with Emotional Eating Behaviors We discussed behavior modification techniques today to help Angie Velasquez deal with her emotional eating and depression. She has agreed to continue to take Wellbutrin SR 150 mg qd #30 with no refills. We will recheck labs in 1 month and Madora agreed to follow up as directed.  Obesity Angie Velasquez is currently in the action stage of change. As such, her goal is to continue with weight loss efforts She has agreed to follow the Category 2 plan Angie Velasquez has been instructed to work up to a goal of 150 minutes of combined cardio and strengthening exercise per week for weight loss and overall health benefits. We discussed the following Behavioral Modification Strategies today: travel eating strategies, holiday eating strategies and celebration eating strategies.  Angie Velasquez has agreed to follow up with our clinic in 3 weeks. She was informed of the importance of frequent follow up visits to maximize her success with intensive lifestyle modifications for her multiple health conditions.  I, Doreene Nest, am acting as transcriptionist for Dennard Nip, MD  I have reviewed the above documentation for accuracy and completeness, and I agree with the above. -Dennard Nip, MD   OBESITY BEHAVIORAL INTERVENTION VISIT  Today's visit was # 3 out of 22.  Starting weight: 249 lbs Starting date: 09/18/17 Today's weight : 243 lbs Today's date: 10/22/2017 Total lbs lost to date: 6 (Patients must lose 7 lbs in the first 6 months to continue with counseling)   ASK: We discussed the diagnosis of obesity with Angie Velasquez today and Angie Velasquez agreed to give [name] permission to discuss obesity  behavioral modification therapy today.  ASSESS: Angie Velasquez has the diagnosis of obesity and her BMI today is 36.96 [place] is in the action stage of change   ADVISE: Angie Velasquez was educated on the multiple health risks of obesity as well as the benefit of weight loss to improve her health. She was advised of the need for long term treatment and the importance of lifestyle modifications.  AGREE: Multiple dietary modification options and treatment options were discussed and  Angie Velasquez agreed to follow the Category 2 plan We discussed the following Behavioral Modification Strategies today:  travel eating strategies, holiday eating strategies and celebration eating strategies.

## 2017-11-12 ENCOUNTER — Ambulatory Visit (INDEPENDENT_AMBULATORY_CARE_PROVIDER_SITE_OTHER): Payer: Medicare HMO | Admitting: Family Medicine

## 2017-11-15 ENCOUNTER — Ambulatory Visit (INDEPENDENT_AMBULATORY_CARE_PROVIDER_SITE_OTHER): Payer: Medicare HMO | Admitting: Family Medicine

## 2017-11-15 VITALS — BP 111/72 | HR 68 | Temp 97.7°F | Ht 68.0 in | Wt 237.0 lb

## 2017-11-15 DIAGNOSIS — F3289 Other specified depressive episodes: Secondary | ICD-10-CM

## 2017-11-15 DIAGNOSIS — Z6836 Body mass index (BMI) 36.0-36.9, adult: Secondary | ICD-10-CM

## 2017-11-15 DIAGNOSIS — Z9189 Other specified personal risk factors, not elsewhere classified: Secondary | ICD-10-CM

## 2017-11-15 DIAGNOSIS — R7303 Prediabetes: Secondary | ICD-10-CM

## 2017-11-15 MED ORDER — BUPROPION HCL ER (SR) 150 MG PO TB12: 150.0000 mg | ORAL_TABLET | Freq: Every day | ORAL | 0 refills | Status: DC

## 2017-11-15 NOTE — Progress Notes (Signed)
Office: [phone]  /  Fax: [phone]   HPI:   Chief Complaint: OBESITY Angie Velasquez is here to discuss her progress with her obesity treatment plan. She is on the Category 2 plan and is following her eating plan approximately 60 % of the time. She states she is walking for 30 minutes 3 times per week. [name] continues to do well with weight loss even with traveling, increase celebration eating, and serious family illness.  Her weight is 237 lb (107.5 kg) today and has had a weight loss of 6 pounds over a period of 3 to 4 weeks since her last visit. She has lost 12 lbs since starting treatment with [name].  Pre-Diabetes Angie Velasquez has a diagnosis of pre-diabetes based on her elevated Hgb A1c and was informed this puts her at greater risk of developing diabetes. She is not taking metformin currently and she is attempting to improve with diet and weight loss to decrease risk of diabetes. She denies nausea or hypoglycemia.  Depression with emotional eating behaviors [name] is doing well on Wellbutrin, she is able to tolerate her husband with dementia. Angie Velasquez struggles with emotional eating and using food for comfort to the extent that it is negatively impacting her health. She often snacks when she is not hungry. Angie Velasquez sometimes feels she is out of control and then feels guilty that she made poor food choices. She has been working on behavior modification techniques to help reduce her emotional eating and has been somewhat successful. She shows no sign of suicidal or homicidal ideations.  Depression screen [facility] 2/9 09/18/2017 09/05/2017 03/26/2017 03/26/2017 03/14/2016  Decreased Interest 1 2 0 0 0  Down, Depressed, Hopeless 1 2 1  0 0  PHQ - 2 Score 2 4 1  0 0  Altered sleeping 0 0 - - -  Tired, decreased energy 1 1 - - -  Change in appetite 1 0 - - -  Feeling bad or failure about yourself  1 0 - - -  Trouble concentrating 1 0 - - -  Moving slowly or fidgety/restless 0 0 - - -  Suicidal thoughts 1  0 - - -  PHQ-9 Score 7 5 - - -  Difficult doing work/chores Not difficult at all Somewhat difficult - - -   ALLERGIES: Allergies  Allergen Reactions  . Epinephrine Palpitations  . Codeine Nausea Only  . Penicillins Nausea Only    MEDICATIONS: Current Outpatient Medications on File Prior to Visit  Medication Sig Dispense Refill  . acetaminophen (TYLENOL) 500 MG tablet Take 500 mg by mouth every 6 (six) hours as needed.    [name] allopurinol (ZYLOPRIM) 300 MG tablet TAKE 1 TABLET (300 MG TOTAL) 2 TIMES DAILY. 180 tablet 3  . aspirin 81 MG tablet Take 81 mg by mouth daily.    [name] buPROPion (WELLBUTRIN SR) 150 MG 12 hr tablet Take 1 tablet (150 mg total) daily by mouth. 30 tablet 0  . CALCIUM-MAGNESIUM-ZINC PO Take by mouth.    . clonazePAM (KLONOPIN) 0.5 MG tablet Take 1 tablet (0.5 mg total) by mouth at bedtime. 90 tablet 1  . diazepam (VALIUM) 10 MG tablet Take 10 mg by mouth every 6 (six) hours as needed (as needed for muscle spasms).    [name] lisinopril (PRINIVIL,ZESTRIL) 2.5 MG tablet Take 1 tablet (2.5 mg total) by mouth daily. 90 tablet 3  . OVER THE COUNTER MEDICATION Cherry extract bid    . OVER THE COUNTER MEDICATION B-12 one daily    . OVER  THE COUNTER MEDICATION Vitamin D    . sertraline (ZOLOFT) 50 MG tablet Take 1 tablet (50 mg total) by mouth daily. 90 tablet 3  . simvastatin (ZOCOR) 20 MG tablet TAKE 1 TABLET AT BEDTIME 90 tablet 3  . Sodium Bicarbonate (NICE PURE BAKING SODA) POWD by Does not apply route.    . traMADol (ULTRAM) 50 MG tablet Take 1 tablet (50 mg total) by mouth every 6 (six) hours as needed for moderate pain or severe pain. 20 tablet 0  . triamcinolone cream (KENALOG) 0.1 % Apply 1 application topically 2 (two) times daily. For 7-10 days for recurring eczema issues 80 g 1   No current facility-administered medications on file prior to visit.     PAST MEDICAL HISTORY: Past Medical History:  Diagnosis Date  . Anxiety    years ago - no longer an issue  .  Arthritis    gout  . Cataracts, bilateral   . Complication of anesthesia   . Fracture of pelvis ([place])   . GERD (gastroesophageal reflux disease)   . Hx of chest pain    "anxiety"  . Hyperlipidemia   . INSOMNIA 10/14/2008   in past  . Joint pain   . Kidney problem   . Low blood sugar    eats several smaller meals a day  . Obesity   . Palpitations    Stress related   . Proteinuria    H/O  . Tobacco abuse    quit smoking 08/2013    PAST SURGICAL HISTORY: Past Surgical History:  Procedure Laterality Date  . ABDOMINAL HYSTERECTOMY    . APPENDECTOMY     with hysterectomy  . childbirth     x4  . CHOLECYSTECTOMY  2001  . EYE SURGERY Bilateral 2015   cataract surgery with lens implant  . NEPHRECTOMY  1992   complex cystic mass - nephrectomy right  . ORIF ANKLE FRACTURE Right 07/02/2014   Procedure: OPEN REDUCTION INTERNAL FIXATION (ORIF) RIGHT ANKLE BIMALLOELAR FRACTURE;  Surgeon: Wylene Simmer, MD;  Location: [place];  Service: Orthopedics;  Laterality: Right;  . OTHER SURGICAL HISTORY     hysterectomy, R ovary remains  . SHOULDER SURGERY     bone spurs left    SOCIAL HISTORY: Social History   Tobacco Use  . Smoking status: Former Smoker    Packs/day: 1.00    Years: 35.00    Pack years: 35.00    Types: Cigarettes    Last attempt to quit: 09/01/2013    Years since quitting: 4.2  . Smokeless tobacco: Never Used  . Tobacco comment: Former smoker with no desire to smoke again. Smoking years edited at patients request to include periods when she did not smoke.  Substance Use Topics  . Alcohol use: Yes    Alcohol/week: 0.6 oz    Types: 1 Shots of liquor per week  . Drug use: No    FAMILY HISTORY: Family History  Problem Relation Age of Onset  . Stroke Father   . Alzheimer's disease Father        early, states also parkinsons. died 79  . Hypertension Father   . Obesity Father   . Ovarian cancer Mother        Dr. Jana Hakim   . Arthritis Mother   . Obesity Mother     . Colon cancer Paternal Grandmother   . Liver disease Daughter        On hospice age 31    ROS: Review of  Systems  Constitutional: Positive for weight loss.  Gastrointestinal: Negative for nausea.  Endo/Heme/Allergies:       Negative hypoglycemia  Psychiatric/Behavioral: Positive for depression. Negative for suicidal ideas.    PHYSICAL EXAM: Blood pressure 111/72, pulse 68, temperature 97.7 F (36.5 C), temperature source Oral, height 5\' 8"  (1.727 m), weight 237 lb (107.5 kg), SpO2 97 %. Body mass index is 36.04 kg/m. Physical Exam  Constitutional: She is oriented to person, place, and time. She appears well-developed and well-nourished.  Cardiovascular: Normal rate.  Pulmonary/Chest: Effort normal.  Musculoskeletal: Normal range of motion.  Neurological: She is oriented to person, place, and time.  Skin: Skin is warm and dry.  Psychiatric: She has a normal mood and affect.  Vitals reviewed.   RECENT LABS AND TESTS: BMET    Component Value Date/Time   NA 138 09/18/2017 1049   K 4.2 09/18/2017 1049   CL 98 09/18/2017 1049   CO2 23 09/18/2017 1049   GLUCOSE 96 09/18/2017 1049   GLUCOSE 109 (H) 01/18/2017 0902   BUN 16 09/18/2017 1049   CREATININE 1.19 (H) 09/18/2017 1049   CALCIUM 9.5 09/18/2017 1049   GFRNONAA 46 (L) 09/18/2017 1049   GFRAA 53 (L) 09/18/2017 1049   Lab Results  Component Value Date   HGBA1C 5.7 (H) 09/18/2017   HGBA1C (H) 04/15/2010    5.9 (NOTE)                                                                       According to the ADA Clinical Practice Recommendations for 2011, when HbA1c is used as a screening test:   >=6.5%   Diagnostic of Diabetes Mellitus           (if abnormal result  is confirmed)  5.7-6.4%   Increased risk of developing Diabetes Mellitus  References:Diagnosis and Classification of Diabetes Mellitus,Diabetes PYPP,5093,26(ZTIWP 1):S62-S69 and Standards of Medical Care in         Diabetes - 2011,Diabetes Care,2011,34  (Suppl  1):S11-S61.   HGBA1C 5.8 12/31/2007   Lab Results  Component Value Date   INSULIN 25.3 (H) 09/18/2017   CBC    Component Value Date/Time   WBC 6.4 09/18/2017 1049   WBC 6.6 01/18/2017 0902   RBC 4.57 09/18/2017 1049   RBC 4.39 01/18/2017 0902   HGB 13.5 09/18/2017 1049   HCT 40.4 09/18/2017 1049   PLT 240.0 01/18/2017 0902   MCV 88 09/18/2017 1049   MCH 29.5 09/18/2017 1049   MCH 29.4 07/02/2014 1143   MCHC 33.4 09/18/2017 1049   MCHC 33.2 01/18/2017 0902   RDW 15.2 09/18/2017 1049   LYMPHSABS 2.6 09/18/2017 1049   MONOABS 0.9 04/15/2010 2135   EOSABS 0.2 09/18/2017 1049   BASOSABS 0.0 09/18/2017 1049   Iron/TIBC/Ferritin/ %Sat No results found for: IRON, TIBC, FERRITIN, IRONPCTSAT Lipid Panel     Component Value Date/Time   CHOL 161 09/18/2017 1049   TRIG 233 (H) 09/18/2017 1049   HDL 58 09/18/2017 1049   CHOLHDL 2 01/18/2017 0902   VLDL 28.2 01/18/2017 0902   LDLCALC 56 09/18/2017 1049   LDLDIRECT 77.0 01/18/2017 0902   Hepatic Function Panel     Component Value Date/Time   PROT 6.5 09/18/2017 1049  ALBUMIN 4.7 09/18/2017 1049   AST 35 09/18/2017 1049   ALT 42 (H) 09/18/2017 1049   ALKPHOS 92 09/18/2017 1049   BILITOT 0.3 09/18/2017 1049   BILIDIR <0.1 04/15/2010 2135   IBILI NOT CALCULATED 04/15/2010 2135      Component Value Date/Time   TSH 3.430 09/18/2017 1049   TSH 2.09 05/25/2015 0820   TSH 1.89 12/31/2007 0831    ASSESSMENT AND PLAN: Prediabetes  Other depression - with emotional eating - Plan: buPROPion (WELLBUTRIN SR) 150 MG 12 hr tablet  Class 2 severe obesity with serious comorbidity and body mass index (BMI) of 36.0 to 36.9 in adult, unspecified obesity type ([place])  PLAN:  Pre-Diabetes Cassondra will continue to work on weight loss, exercise, and decreasing simple carbohydrates in her diet to help decrease the risk of diabetes. We dicussed metformin including benefits and risks. She was informed that eating too many simple  carbohydrates or too many calories at one sitting increases the likelihood of GI side effects. Kegan agrees to continue diet prescription and may need to start metformin if she starts to struggle more. Tyreona declined metformin for now and a prescription was not written today. Kaiya agrees to follow up with our clinic in 4 weeks as directed to monitor her progress.  Depression with Emotional Eating Behaviors We discussed behavior modification techniques today to help Soyla deal with her emotional eating and depression. Yarelly agrees to continue taking Wellbutrin SR 150 mg qd #30 and we will refill for 1 month. Pranathi agrees to follow up with our clinic in 4 weeks.  Obesity Wyonia is currently in the action stage of change. As such, her goal is to continue with weight loss efforts She has agreed to follow the Category 2 plan Lavona has been instructed to work up to a goal of 150 minutes of combined cardio and strengthening exercise per week for weight loss and overall health benefits. We discussed the following Behavioral Modification Strategies today: increasing lean protein intake and work on meal planning and easy cooking plans   Crystallynn has agreed to follow up with our clinic in 4 weeks. She was informed of the importance of frequent follow up visits to maximize her success with intensive lifestyle modifications for her multiple health conditions.  I, Trixie Dredge, am acting as transcriptionist for Dennard Nip, MD  I have reviewed the above documentation for accuracy and completeness, and I agree with the above. -Dennard Nip, MD     Today's visit was # 4 out of 22.  Starting weight: 249 lbs Starting date: 09/18/17 Today's weight : 237 lbs  Today's date: 11/15/2017 Total lbs lost to date: 12 (Patients must lose 7 lbs in the first 6 months to continue with counseling)   ASK: We discussed the diagnosis of obesity with [name] today and Shandell agreed  to give [name] permission to discuss obesity behavioral modification therapy today.  ASSESS: Garima has the diagnosis of obesity and her BMI today is 36.04 Runell is in the action stage of change   ADVISE: Raiden was educated on the multiple health risks of obesity as well as the benefit of weight loss to improve her health. She was advised of the need for long term treatment and the importance of lifestyle modifications.  AGREE: Multiple dietary modification options and treatment options were discussed and  Allona agreed to follow the Category 2 plan We discussed the following Behavioral Modification Strategies today: increasing lean protein intake and work on meal planning  and easy cooking plans

## 2017-11-22 ENCOUNTER — Encounter (INDEPENDENT_AMBULATORY_CARE_PROVIDER_SITE_OTHER): Payer: Self-pay | Admitting: Family Medicine

## 2017-11-22 ENCOUNTER — Encounter: Payer: Self-pay | Admitting: Family Medicine

## 2017-12-04 ENCOUNTER — Other Ambulatory Visit (INDEPENDENT_AMBULATORY_CARE_PROVIDER_SITE_OTHER): Payer: Self-pay | Admitting: Family Medicine

## 2017-12-04 DIAGNOSIS — F3289 Other specified depressive episodes: Secondary | ICD-10-CM

## 2017-12-12 ENCOUNTER — Encounter (INDEPENDENT_AMBULATORY_CARE_PROVIDER_SITE_OTHER): Payer: Self-pay

## 2017-12-12 ENCOUNTER — Ambulatory Visit (INDEPENDENT_AMBULATORY_CARE_PROVIDER_SITE_OTHER): Payer: Medicare HMO | Admitting: Family Medicine

## 2017-12-13 ENCOUNTER — Encounter: Payer: Self-pay | Admitting: Family Medicine

## 2017-12-13 ENCOUNTER — Other Ambulatory Visit: Payer: Self-pay

## 2017-12-13 DIAGNOSIS — M25559 Pain in unspecified hip: Secondary | ICD-10-CM

## 2017-12-14 NOTE — Telephone Encounter (Signed)
Can you please call and schedule this patient with Dr. Paulla Fore for a new patient appointment- thank you!

## 2017-12-17 ENCOUNTER — Encounter: Payer: Self-pay | Admitting: Sports Medicine

## 2017-12-17 ENCOUNTER — Ambulatory Visit (INDEPENDENT_AMBULATORY_CARE_PROVIDER_SITE_OTHER): Payer: Medicare HMO | Admitting: Physician Assistant

## 2017-12-17 VITALS — BP 105/69 | HR 87 | Temp 97.6°F | Ht 68.0 in | Wt 236.0 lb

## 2017-12-17 DIAGNOSIS — Z6836 Body mass index (BMI) 36.0-36.9, adult: Secondary | ICD-10-CM | POA: Diagnosis not present

## 2017-12-17 DIAGNOSIS — M25551 Pain in right hip: Secondary | ICD-10-CM | POA: Diagnosis not present

## 2017-12-18 NOTE — Progress Notes (Addendum)
Office: [phone]  /  Fax: [phone]   HPI:   Chief Complaint: OBESITY Angie Velasquez is here to discuss her progress with her obesity treatment plan. She is on the Category 2 plan and is following her eating plan approximately 50 % of the time. She states she is exercising 0 minutes 0 times per week. Angie Velasquez continues to do well with weight loss. She has had increase in emotional eating as she is taking care of her sick husband and dealing with chronic back pain.  Her weight is 236 lb (107 kg) today and has had a weight loss of 1 pound over a period of 4 to 5 weeks since her last visit. She has lost 13 lbs since starting treatment with [name].  Right Hip pain Angie Velasquez has R hip pain with evidence of hairline fracture and ambulates with walker. States she is switching Orthopedist and is in the process of getting a referral.  According to record review, she is on Klonopin, Valium and Tramadol. States has been needing to use her Tramadol every 6 hours due to the severity of her pain. She denies overuse of the medications.  She is counseled on the risk associated with taking all these medication together. She is offered to be referred to a pain clinic for management of her pain, but declines.   ALLERGIES: Allergies  Allergen Reactions  . Epinephrine Palpitations  . Codeine Nausea Only  . Penicillins Nausea Only    MEDICATIONS: Current Outpatient Medications on File Prior to Visit  Medication Sig Dispense Refill  . acetaminophen (TYLENOL) 500 MG tablet Take 500 mg by mouth every 6 (six) hours as needed.    [name] allopurinol (ZYLOPRIM) 300 MG tablet TAKE 1 TABLET (300 MG TOTAL) 2 TIMES DAILY. 180 tablet 3  . aspirin 81 MG tablet Take 81 mg by mouth daily.    [name] buPROPion (WELLBUTRIN SR) 150 MG 12 hr tablet Take 1 tablet (150 mg total) by mouth daily. 30 tablet 0  . CALCIUM-MAGNESIUM-ZINC PO Take by mouth.    . clonazePAM (KLONOPIN) 0.5 MG tablet Take 1 tablet (0.5 mg total) by mouth at bedtime. 90  tablet 1  . diazepam (VALIUM) 10 MG tablet Take 10 mg by mouth every 6 (six) hours as needed (as needed for muscle spasms).    [name] lisinopril (PRINIVIL,ZESTRIL) 2.5 MG tablet Take 1 tablet (2.5 mg total) by mouth daily. 90 tablet 3  . OVER THE COUNTER MEDICATION Cherry extract bid    . OVER THE COUNTER MEDICATION B-12 one daily    . OVER THE COUNTER MEDICATION Vitamin D    . sertraline (ZOLOFT) 50 MG tablet Take 1 tablet (50 mg total) by mouth daily. 90 tablet 3  . simvastatin (ZOCOR) 20 MG tablet TAKE 1 TABLET AT BEDTIME 90 tablet 3  . Sodium Bicarbonate (NICE PURE BAKING SODA) POWD by Does not apply route.    . traMADol (ULTRAM) 50 MG tablet Take 1 tablet (50 mg total) by mouth every 6 (six) hours as needed for moderate pain or severe pain. 20 tablet 0  . triamcinolone cream (KENALOG) 0.1 % Apply 1 application topically 2 (two) times daily. For 7-10 days for recurring eczema issues 80 g 1   No current facility-administered medications on file prior to visit.     PAST MEDICAL HISTORY: Past Medical History:  Diagnosis Date  . Anxiety    years ago - no longer an issue  . Arthritis    gout  . Cataracts, bilateral   .  Complication of anesthesia   . Fracture of pelvis ([place])   . GERD (gastroesophageal reflux disease)   . Hx of chest pain    "anxiety"  . Hyperlipidemia   . INSOMNIA 10/14/2008   in past  . Joint pain   . Kidney problem   . Low blood sugar    eats several smaller meals a day  . Obesity   . Palpitations    Stress related   . Proteinuria    H/O  . Tobacco abuse    quit smoking 08/2013    PAST SURGICAL HISTORY: Past Surgical History:  Procedure Laterality Date  . ABDOMINAL HYSTERECTOMY    . APPENDECTOMY     with hysterectomy  . childbirth     x4  . CHOLECYSTECTOMY  2001  . EYE SURGERY Bilateral 2015   cataract surgery with lens implant  . NEPHRECTOMY  1992   complex cystic mass - nephrectomy right  . ORIF ANKLE FRACTURE Right 07/02/2014   Procedure: OPEN  REDUCTION INTERNAL FIXATION (ORIF) RIGHT ANKLE BIMALLOELAR FRACTURE;  Surgeon: Wylene Simmer, MD;  Location: [place];  Service: Orthopedics;  Laterality: Right;  . OTHER SURGICAL HISTORY     hysterectomy, R ovary remains  . SHOULDER SURGERY     bone spurs left    SOCIAL HISTORY: Social History   Tobacco Use  . Smoking status: Former Smoker    Packs/day: 1.00    Years: 35.00    Pack years: 35.00    Types: Cigarettes    Last attempt to quit: 09/01/2013    Years since quitting: 4.2  . Smokeless tobacco: Never Used  . Tobacco comment: Former smoker with no desire to smoke again. Smoking years edited at patients request to include periods when she did not smoke.  Substance Use Topics  . Alcohol use: Yes    Alcohol/week: 0.6 oz    Types: 1 Shots of liquor per week  . Drug use: No    FAMILY HISTORY: Family History  Problem Relation Age of Onset  . Stroke Father   . Alzheimer's disease Father        early, states also parkinsons. died 53  . Hypertension Father   . Obesity Father   . Ovarian cancer Mother        Dr. Jana Hakim   . Arthritis Mother   . Obesity Mother   . Colon cancer Paternal Grandmother   . Liver disease Daughter        On hospice age 64    ROS: Review of Systems  Constitutional: Positive for weight loss.  Musculoskeletal: Positive for back pain (lower back).    PHYSICAL EXAM: Blood pressure 105/69, pulse 87, temperature 97.6 F (36.4 C), temperature source Oral, height 5\' 8"  (1.727 m), weight 236 lb (107 kg), SpO2 97 %. Body mass index is 35.88 kg/m. Physical Exam  Constitutional: She is oriented to person, place, and time. She appears well-developed and well-nourished.  Cardiovascular: Normal rate.  Pulmonary/Chest: Effort normal.  Musculoskeletal: Normal range of motion.  Neurological: She is oriented to person, place, and time.  Skin: Skin is warm and dry.  Psychiatric: She has a normal mood and affect. Her behavior is normal.  Vitals  reviewed.   RECENT LABS AND TESTS: BMET    Component Value Date/Time   NA 138 09/18/2017 1049   K 4.2 09/18/2017 1049   CL 98 09/18/2017 1049   CO2 23 09/18/2017 1049   GLUCOSE 96 09/18/2017 1049   GLUCOSE 109 (H)  01/18/2017 0902   BUN 16 09/18/2017 1049   CREATININE 1.19 (H) 09/18/2017 1049   CALCIUM 9.5 09/18/2017 1049   GFRNONAA 46 (L) 09/18/2017 1049   GFRAA 53 (L) 09/18/2017 1049   Lab Results  Component Value Date   HGBA1C 5.7 (H) 09/18/2017   HGBA1C (H) 04/15/2010    5.9 (NOTE)                                                                       According to the ADA Clinical Practice Recommendations for 2011, when HbA1c is used as a screening test:   >=6.5%   Diagnostic of Diabetes Mellitus           (if abnormal result  is confirmed)  5.7-6.4%   Increased risk of developing Diabetes Mellitus  References:Diagnosis and Classification of Diabetes Mellitus,Diabetes KYHC,6237,62(GBTDV 1):S62-S69 and Standards of Medical Care in         Diabetes - 2011,Diabetes Care,2011,34  (Suppl 1):S11-S61.   HGBA1C 5.8 12/31/2007   Lab Results  Component Value Date   INSULIN 25.3 (H) 09/18/2017   CBC    Component Value Date/Time   WBC 6.4 09/18/2017 1049   WBC 6.6 01/18/2017 0902   RBC 4.57 09/18/2017 1049   RBC 4.39 01/18/2017 0902   HGB 13.5 09/18/2017 1049   HCT 40.4 09/18/2017 1049   PLT 240.0 01/18/2017 0902   MCV 88 09/18/2017 1049   MCH 29.5 09/18/2017 1049   MCH 29.4 07/02/2014 1143   MCHC 33.4 09/18/2017 1049   MCHC 33.2 01/18/2017 0902   RDW 15.2 09/18/2017 1049   LYMPHSABS 2.6 09/18/2017 1049   MONOABS 0.9 04/15/2010 2135   EOSABS 0.2 09/18/2017 1049   BASOSABS 0.0 09/18/2017 1049   Iron/TIBC/Ferritin/ %Sat No results found for: IRON, TIBC, FERRITIN, IRONPCTSAT Lipid Panel     Component Value Date/Time   CHOL 161 09/18/2017 1049   TRIG 233 (H) 09/18/2017 1049   HDL 58 09/18/2017 1049   CHOLHDL 2 01/18/2017 0902   VLDL 28.2 01/18/2017 0902   LDLCALC 56  09/18/2017 1049   LDLDIRECT 77.0 01/18/2017 0902   Hepatic Function Panel     Component Value Date/Time   PROT 6.5 09/18/2017 1049   ALBUMIN 4.7 09/18/2017 1049   AST 35 09/18/2017 1049   ALT 42 (H) 09/18/2017 1049   ALKPHOS 92 09/18/2017 1049   BILITOT 0.3 09/18/2017 1049   BILIDIR <0.1 04/15/2010 2135   IBILI NOT CALCULATED 04/15/2010 2135      Component Value Date/Time   TSH 3.430 09/18/2017 1049   TSH 2.09 05/25/2015 0820   TSH 1.89 12/31/2007 0831    ASSESSMENT AND PLAN: Right hip pain  Class 2 severe obesity with serious comorbidity and body mass index (BMI) of 36.0 to 36.9 in adult, unspecified obesity type ([place])  PLAN:  Right Hip Pain Angie Velasquez is advised to discuss her pain medication regimen with her PCP. She voices understanding with the risk of respiratory distress or dependence associated with her pain medications. She is to continue with her Orthopedic referral as planned. She agrees to follow up with our clinic in 2 weeks.    Obesity Angie Velasquez is currently in the action stage of change. As such, her goal is to  continue with weight loss efforts She has agreed to follow the Category 2 plan Angie Velasquez has been instructed to work up to a goal of 150 minutes of combined cardio and strengthening exercise per week for weight loss and overall health benefits. We discussed the following Behavioral Modification Strategies today: increasing lean protein intake and work on meal planning and easy cooking plans   Angie Velasquez has agreed to follow up with our clinic in 2 weeks. She was informed of the importance of frequent follow up visits to maximize her success with intensive lifestyle modifications for her multiple health conditions.    OBESITY BEHAVIORAL INTERVENTION VISIT  Today's visit was # 5 out of 22.  Starting weight: 249 lbs Starting date: 09/18/17 Today's weight : 236 lbs  Today's date: 12/17/2017 Total lbs lost to date: 13 (Patients must lose 7 lbs in the first 6  months to continue with counseling)   ASK: We discussed the diagnosis of obesity with Angie Velasquez today and Angie Velasquez agreed to give [name] permission to discuss obesity behavioral modification therapy today.  ASSESS: Angie Velasquez has the diagnosis of obesity and her BMI today is 35.89 Angie Velasquez is in the action stage of change   ADVISE: Olean was educated on the multiple health risks of obesity as well as the benefit of weight loss to improve her health. She was advised of the need for long term treatment and the importance of lifestyle modifications.  AGREE: Multiple dietary modification options and treatment options were discussed and  Angie Velasquez agreed to the above obesity treatment plan.   Angie Velasquez, am acting as transcriptionist for Lacy Duverney, [facility] have reviewed this note and agree with its contents  I have reviewed the above documentation for accuracy and completeness, and I agree with the above. -Dennard Nip, MD

## 2017-12-19 ENCOUNTER — Ambulatory Visit: Payer: Self-pay

## 2017-12-19 ENCOUNTER — Encounter: Payer: Self-pay | Admitting: Sports Medicine

## 2017-12-19 ENCOUNTER — Ambulatory Visit (INDEPENDENT_AMBULATORY_CARE_PROVIDER_SITE_OTHER): Payer: Medicare HMO | Admitting: Sports Medicine

## 2017-12-19 VITALS — BP 120/72 | HR 78 | Ht 68.0 in

## 2017-12-19 DIAGNOSIS — M25551 Pain in right hip: Secondary | ICD-10-CM

## 2017-12-19 DIAGNOSIS — M869 Osteomyelitis, unspecified: Secondary | ICD-10-CM

## 2017-12-19 DIAGNOSIS — Z8739 Personal history of other diseases of the musculoskeletal system and connective tissue: Secondary | ICD-10-CM | POA: Insufficient documentation

## 2017-12-19 NOTE — Assessment & Plan Note (Signed)
Fairly severe osteitis pubis based on MRI from July 2018 where she was told that this was a stress fracture in the pubic ramus.  6 months of using a walker and her symptoms are continuing to be severe and actually now occurring on the left side.  Diagnostic and therapeutic injection performed on 12/19/2017 with 100% improvement in her pain immediately following this with the anesthetic phase.   Given the marked improvement in her symptoms I think the osteitis pubis has been her underlying issue this entire time.  I do not recommend further operative management at this time as this does not seem to be coming from the femoral acetabular joint given she had no relief after serial intra-articular injections during the anesthetic or latent phase.  If any lack of improvement could consider pubic symphysis fusion but I am optimistic that topical Arnica gel as well as injections as well as repeat injections if needed will be more than enough to provide her complete relief.  Would like for her beginning to increase her activity as tolerated after I see her back in 2 weeks.

## 2017-12-19 NOTE — Patient Instructions (Addendum)
Look into getting Arnica gel.  You can rub this on the pubic symphysis as needed.  Continue with the tumor work as well.  I would like to see her back in 2 weeks to ensure that you are doing well and we can discuss increasing her physical activity at that time.   You had an injection today.  Things to be aware of after injection are listed below: . You may experience no significant improvement or even a slight worsening in your symptoms during the first 24 to 48 hours.  After that we expect your symptoms to improve gradually over the next 2 weeks for the medicine to have its maximal effect.  You should continue to have improvement out to 6 weeks after your injection. . Dr. Paulla Fore recommends icing the site of the injection for 20 minutes  1-2 times the day of your injection . You may shower but no swimming, tub bath or Jacuzzi for 24 hours. . If your bandage falls off this does not need to be replaced.  It is appropriate to remove the bandage after 4 hours. . You may resume light activities as tolerated unless otherwise directed per Dr. Paulla Fore during your visit  POSSIBLE STEROID SIDE EFFECTS:  Side effects from injectable steroids tend to be less than when taken orally however you may experience some of the symptoms listed below.  If experienced these should only last for a short period of time. Change in menstrual flow  Edema (swelling)  Increased appetite Skin flushing (redness)  Skin rash/acne  Thrush (oral) Yeast vaginitis    Increased sweating  Depression Increased blood glucose levels Cramping and leg/calf  Euphoria (feeling happy)  POSSIBLE PROCEDURE SIDE EFFECTS: The side effects of the injection are usually fairly minimal however if you may experience some of the following side effects that are usually self-limited and will is off on their own.  If you are concerned please feel free to call the office with questions:  Increased numbness or tingling  Nausea or vomiting  Swelling or bruising  at the injection site   Please call our office if if you experience any of the following symptoms over the next week as these can be signs of infection:   Fever greater than 100.4F  Significant swelling at the injection site  Significant redness or drainage from the injection site  If after 2 weeks you are continuing to have worsening symptoms please call our office to discuss what the next appropriate actions should be including the potential for a return office visit or other diagnostic testing.
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
## 2017-12-19 NOTE — Procedures (Signed)
PROCEDURE NOTE:  Ultrasound Guided: Injection: Pubic symphysis, intra-articular Images were obtained and interpreted by myself, Teresa Coombs, DO  Images have been saved and stored to PACS system. Images obtained on: GE S7 Ultrasound machine  ULTRASOUND FINDINGS:  Marked mushroom sign and osteophytic spurring of the pubic symphysis with mild neovascularity.  DESCRIPTION OF PROCEDURE:  The patient's clinical condition is marked by substantial pain and/or significant functional disability. Other conservative therapy has not provided relief, is contraindicated, or not appropriate. There is a reasonable likelihood that injection will significantly improve the patient's pain and/or functional impairment.  After discussing the risks, benefits and expected outcomes of the injection and all questions were reviewed and answered, the patient wished to undergo the above named procedure. Verbal consent was obtained.  The ultrasound was used to identify the target structure and adjacent neurovascular structures. The skin was then prepped in sterile fashion and the target structure was injected under direct visualization using sterile technique as below:  PREP: Alcohol, Ethel Chloride,  APPROACH: direct, stopcock technique, 25g 1.5in. INJECTATE: 3 cc 1% lidocaine, 1cc 0.5% marcaine, 1cc 80mg /mL DepoMedrol     DRESSING: Band-Aid   Post procedural instructions including recommending icing and warning signs for infection were reviewed.  This procedure was well tolerated and there were no complications.   IMPRESSION: Succesful Ultrasound Guided: Injection

## 2017-12-21 ENCOUNTER — Other Ambulatory Visit (INDEPENDENT_AMBULATORY_CARE_PROVIDER_SITE_OTHER): Payer: Self-pay | Admitting: Family Medicine

## 2017-12-21 DIAGNOSIS — F3289 Other specified depressive episodes: Secondary | ICD-10-CM

## 2017-12-31 ENCOUNTER — Ambulatory Visit (INDEPENDENT_AMBULATORY_CARE_PROVIDER_SITE_OTHER): Payer: Medicare HMO | Admitting: Physician Assistant

## 2017-12-31 VITALS — BP 94/67 | HR 81 | Temp 97.7°F | Ht 68.0 in | Wt 234.0 lb

## 2017-12-31 DIAGNOSIS — I10 Essential (primary) hypertension: Secondary | ICD-10-CM | POA: Diagnosis not present

## 2017-12-31 DIAGNOSIS — F3289 Other specified depressive episodes: Secondary | ICD-10-CM | POA: Diagnosis not present

## 2017-12-31 DIAGNOSIS — Z6835 Body mass index (BMI) 35.0-35.9, adult: Secondary | ICD-10-CM

## 2017-12-31 MED ORDER — BUPROPION HCL ER (SR) 200 MG PO TB12: 200.0000 mg | ORAL_TABLET | Freq: Every day | ORAL | 0 refills | Status: DC

## 2017-12-31 NOTE — Progress Notes (Signed)
Office: [phone]  /  Fax: [phone]   HPI:   Chief Complaint: OBESITY Angie Velasquez is here to discuss her progress with her obesity treatment plan. She is on the Category 2 plan and is following her eating plan approximately 50 % of the time. She states she is exercising 0 minutes 0 times per week. Seri continues to do well with weight loss. She is mindful of her eating and states her hunger is well controlled. She has noticed increase with her cravings.  Her weight is 234 lb (106.1 kg) today and has had a weight loss of 2 pounds over a period of 2 weeks since her last visit. She has lost 15 lbs since starting treatment with [name].  Hypertension Angie Velasquez is a 72 y.o. female with hypertension. Angie Velasquez is status post Nephrectomy, she is on lisinopril 2.5 mg. Her blood pressure today is low and she denies dizziness, headaches, chest pain, or shortness of breath. Angie Velasquez is advised to talk to her primary care physician about lisinopril and any need for adjustment. She is working weight loss to help control her blood pressure with the goal of decreasing her risk of heart attack and stroke. Angie Velasquez's blood pressure is not currently controlled.  Depression with emotional eating behaviors Angie Velasquez has noticed increase with emotional eating over the past 2 weeks. Angie Velasquez struggles with emotional eating and using food for comfort to the extent that it is negatively impacting her health. She often snacks when she is not hungry. Angie Velasquez sometimes feels she is out of control and then feels guilty that she made poor food choices. She has been working on behavior modification techniques to help reduce her emotional eating and has been somewhat successful. Her mood is stable and she shows no sign of suicidal or homicidal ideations.  Depression screen Angie Velasquez 2/9 09/18/2017 09/05/2017 03/26/2017 03/26/2017 03/14/2016  Decreased Interest 1 2 0 0 0  Down, Depressed, Hopeless 1 2 1  0 0  PHQ - 2 Score 2 4  1  0 0  Altered sleeping 0 0 - - -  Tired, decreased energy 1 1 - - -  Change in appetite 1 0 - - -  Feeling bad or failure about yourself  1 0 - - -  Trouble concentrating 1 0 - - -  Moving slowly or fidgety/restless 0 0 - - -  Suicidal thoughts 1 0 - - -  PHQ-9 Score 7 5 - - -  Difficult doing work/chores Not difficult at all Somewhat difficult - - -   ALLERGIES: Allergies  Allergen Reactions  . Epinephrine Palpitations  . Codeine Nausea Only  . Penicillins Nausea Only    MEDICATIONS: Current Outpatient Medications on File Prior to Visit  Medication Sig Dispense Refill  . acetaminophen (TYLENOL) 500 MG tablet Take 500 mg by mouth every 6 (six) hours as needed.    [name] allopurinol (ZYLOPRIM) 300 MG tablet TAKE 1 TABLET (300 MG TOTAL) 2 TIMES DAILY. 180 tablet 3  . aspirin 81 MG tablet Take 81 mg by mouth daily.    [name] CALCIUM-MAGNESIUM-ZINC PO Take by mouth.    . clonazePAM (KLONOPIN) 0.5 MG tablet Take 1 tablet (0.5 mg total) by mouth at bedtime. 90 tablet 1  . diazepam (VALIUM) 10 MG tablet Take 10 mg by mouth every 6 (six) hours as needed (as needed for muscle spasms).    [name] lisinopril (PRINIVIL,ZESTRIL) 2.5 MG tablet Take 1 tablet (2.5 mg total) by mouth daily. 90 tablet 3  . OVER THE  [place] extract bid    . OVER THE COUNTER MEDICATION B-12 one daily    . OVER THE COUNTER MEDICATION Vitamin D    . sertraline (ZOLOFT) 50 MG tablet Take 1 tablet (50 mg total) by mouth daily. 90 tablet 3  . simvastatin (ZOCOR) 20 MG tablet TAKE 1 TABLET AT BEDTIME 90 tablet 3  . Sodium Bicarbonate (NICE PURE BAKING SODA) POWD by Does not apply route.    . traMADol (ULTRAM) 50 MG tablet Take 1 tablet (50 mg total) by mouth every 6 (six) hours as needed for moderate pain or severe pain. 20 tablet 0  . triamcinolone cream (KENALOG) 0.1 % Apply 1 application topically 2 (two) times daily. For 7-10 days for recurring eczema issues 80 g 1   No current facility-administered medications on  file prior to visit.     PAST MEDICAL HISTORY: Past Medical History:  Diagnosis Date  . Anxiety    years ago - no longer an issue  . Arthritis    gout  . Cataracts, bilateral   . Complication of anesthesia   . Fracture of pelvis ([place])   . GERD (gastroesophageal reflux disease)   . Hx of chest pain    "anxiety"  . Hyperlipidemia   . INSOMNIA 10/14/2008   in past  . Joint pain   . Kidney problem   . Low blood sugar    eats several smaller meals a day  . Obesity   . Palpitations    Stress related   . Proteinuria    H/O  . Tobacco abuse    quit smoking 08/2013    PAST SURGICAL HISTORY: Past Surgical History:  Procedure Laterality Date  . ABDOMINAL HYSTERECTOMY    . APPENDECTOMY     with hysterectomy  . childbirth     x4  . CHOLECYSTECTOMY  2001  . EYE SURGERY Bilateral 2015   cataract surgery with lens implant  . NEPHRECTOMY  1992   complex cystic mass - nephrectomy right  . ORIF ANKLE FRACTURE Right 07/02/2014   Procedure: OPEN REDUCTION INTERNAL FIXATION (ORIF) RIGHT ANKLE BIMALLOELAR FRACTURE;  Surgeon: Wylene Simmer, MD;  Location: [facility];  Service: Orthopedics;  Laterality: Right;  . OTHER SURGICAL HISTORY     hysterectomy, R ovary remains  . SHOULDER SURGERY     bone spurs left    SOCIAL HISTORY: Social History   Tobacco Use  . Smoking status: Former Smoker    Packs/day: 1.00    Years: 35.00    Pack years: 35.00    Types: Cigarettes    Last attempt to quit: 09/01/2013    Years since quitting: 4.3  . Smokeless tobacco: Never Used  . Tobacco comment: Former smoker with no desire to smoke again. Smoking years edited at patients request to include periods when she did not smoke.  Substance Use Topics  . Alcohol use: Yes    Alcohol/week: 0.6 oz    Types: 1 Shots of liquor per week  . Drug use: No    FAMILY HISTORY: Family History  Problem Relation Age of Onset  . Stroke Father   . Alzheimer's disease Father        early, states also parkinsons.  died 92  . Hypertension Father   . Obesity Father   . Ovarian cancer Mother        Dr. Jana Hakim   . Arthritis Mother   . Obesity Mother   . Colon cancer Paternal Grandmother   .  Liver disease Daughter        On hospice age 41    ROS: Review of Systems  Constitutional: Positive for weight loss.  Respiratory: Negative for shortness of breath.   Cardiovascular: Negative for chest pain.  Neurological: Negative for dizziness and headaches.  Psychiatric/Behavioral: Positive for depression. Negative for suicidal ideas.    PHYSICAL EXAM: Blood pressure 94/67, pulse 81, temperature 97.7 F (36.5 C), temperature source Oral, height 5\' 8"  (1.727 m), weight 234 lb (106.1 kg), SpO2 97 %. Body mass index is 35.58 kg/m. Physical Exam  Constitutional: She is oriented to person, place, and time. She appears well-developed and well-nourished.  Cardiovascular: Normal rate.  Pulmonary/Chest: Effort normal.  Musculoskeletal: Normal range of motion.  Neurological: She is oriented to person, place, and time.  Skin: Skin is warm and dry.  Psychiatric: She has a normal mood and affect. Her behavior is normal.  Vitals reviewed.   RECENT LABS AND TESTS: BMET    Component Value Date/Time   NA 138 09/18/2017 1049   K 4.2 09/18/2017 1049   CL 98 09/18/2017 1049   CO2 23 09/18/2017 1049   GLUCOSE 96 09/18/2017 1049   GLUCOSE 109 (H) 01/18/2017 0902   BUN 16 09/18/2017 1049   CREATININE 1.19 (H) 09/18/2017 1049   CALCIUM 9.5 09/18/2017 1049   GFRNONAA 46 (L) 09/18/2017 1049   GFRAA 53 (L) 09/18/2017 1049   Lab Results  Component Value Date   HGBA1C 5.7 (H) 09/18/2017   HGBA1C (H) 04/15/2010    5.9 (NOTE)                                                                       According to the ADA Clinical Practice Recommendations for 2011, when HbA1c is used as a screening test:   >=6.5%   Diagnostic of Diabetes Mellitus           (if abnormal result  is confirmed)  5.7-6.4%   Increased risk  of developing Diabetes Mellitus  References:Diagnosis and Classification of Diabetes Mellitus,Diabetes GNFA,2130,86(VHQIO 1):S62-S69 and Standards of Medical Care in         Diabetes - 2011,Diabetes Care,2011,34  (Suppl 1):S11-S61.   HGBA1C 5.8 12/31/2007   Lab Results  Component Value Date   INSULIN 25.3 (H) 09/18/2017   CBC    Component Value Date/Time   WBC 6.4 09/18/2017 1049   WBC 6.6 01/18/2017 0902   RBC 4.57 09/18/2017 1049   RBC 4.39 01/18/2017 0902   HGB 13.5 09/18/2017 1049   HCT 40.4 09/18/2017 1049   PLT 240.0 01/18/2017 0902   MCV 88 09/18/2017 1049   MCH 29.5 09/18/2017 1049   MCH 29.4 07/02/2014 1143   MCHC 33.4 09/18/2017 1049   MCHC 33.2 01/18/2017 0902   RDW 15.2 09/18/2017 1049   LYMPHSABS 2.6 09/18/2017 1049   MONOABS 0.9 04/15/2010 2135   EOSABS 0.2 09/18/2017 1049   BASOSABS 0.0 09/18/2017 1049   Iron/TIBC/Ferritin/ %Sat No results found for: IRON, TIBC, FERRITIN, IRONPCTSAT Lipid Panel     Component Value Date/Time   CHOL 161 09/18/2017 1049   TRIG 233 (H) 09/18/2017 1049   HDL 58 09/18/2017 1049   CHOLHDL 2 01/18/2017 0902   VLDL 28.2 01/18/2017 0902  LDLCALC 56 09/18/2017 1049   LDLDIRECT 77.0 01/18/2017 0902   Hepatic Function Panel     Component Value Date/Time   PROT 6.5 09/18/2017 1049   ALBUMIN 4.7 09/18/2017 1049   AST 35 09/18/2017 1049   ALT 42 (H) 09/18/2017 1049   ALKPHOS 92 09/18/2017 1049   BILITOT 0.3 09/18/2017 1049   BILIDIR <0.1 04/15/2010 2135   IBILI NOT CALCULATED 04/15/2010 2135      Component Value Date/Time   TSH 3.430 09/18/2017 1049   TSH 2.09 05/25/2015 0820   TSH 1.89 12/31/2007 0831    ASSESSMENT AND PLAN: Essential hypertension  Other depression - with emotional eating - Plan: buPROPion (WELLBUTRIN SR) 200 MG 12 hr tablet  Class 2 severe obesity with serious comorbidity and body mass index (BMI) of 35.0 to 35.9 in adult, unspecified obesity type ([place])  PLAN:  Hypertension We discussed sodium  restriction, working on healthy weight loss, and a regular exercise program as the means to achieve improved blood pressure control. Lis agreed with this plan and agreed to follow up as directed. We will continue to monitor her blood pressure as well as her progress with the above lifestyle modifications. She will talk to her primary care physician for adjustment of lisinopril. Angie Velasquez agrees to continue lisinopril at 2.5 mg and will watch for signs of hypotension as she continues her lifestyle modifications. Angie Velasquez agrees to follow up with our clinic in 2 weeks.  Depression with Emotional Eating Behaviors We discussed behavior modification techniques today to help Ladoris deal with her emotional eating and depression. Angie Velasquez agrees to continue taking Wellbutrin SR 200 mg qd #30 and we will refill for 1 month. Angie Velasquez agrees to follow up with our clinic in 2 weeks.  Obesity Angie Velasquez is currently in the action stage of change. As such, her goal is to continue with weight loss efforts She has agreed to follow the Category 2 plan Angie Velasquez has been instructed to work up to a goal of 150 minutes of combined cardio and strengthening exercise per week for weight loss and overall health benefits. We discussed the following Behavioral Modification Strategies today: increasing lean protein intake and work on meal planning and easy cooking plans   Angie Velasquez has agreed to follow up with our clinic in 2 weeks. She was informed of the importance of frequent follow up visits to maximize her success with intensive lifestyle modifications for her multiple health conditions.   OBESITY BEHAVIORAL INTERVENTION VISIT  Today's visit was # 6 out of 22.  Starting weight: 249 lbs Starting date: 09/18/17 Today's weight : 234 lbs  Today's date: 12/31/2017 Total lbs lost to date: 15 (Patients must lose 7 lbs in the first 6 months to continue with counseling)   ASK: We discussed the diagnosis of obesity with  Angie Velasquez today and Angie Velasquez agreed to give [name] permission to discuss obesity behavioral modification therapy today.  ASSESS: Angie Velasquez has the diagnosis of obesity and her BMI today is 35.59 Angie Velasquez is in the action stage of change   ADVISE: Angie Velasquez was educated on the multiple health risks of obesity as well as the benefit of weight loss to improve her health. She was advised of the need for long term treatment and the importance of lifestyle modifications.  AGREE: Multiple dietary modification options and treatment options were discussed and  Angie Velasquez agreed to the above obesity treatment plan.   Wilhemena Durie, am acting as transcriptionist for [employer], PA-C I, Lacy Duverney [facility], have  reviewed this note and agree with its contents.

## 2018-01-02 ENCOUNTER — Ambulatory Visit (INDEPENDENT_AMBULATORY_CARE_PROVIDER_SITE_OTHER): Payer: Medicare HMO | Admitting: Sports Medicine

## 2018-01-02 ENCOUNTER — Encounter: Payer: Self-pay | Admitting: Sports Medicine

## 2018-01-02 VITALS — BP 114/80 | HR 80

## 2018-01-02 DIAGNOSIS — Z6835 Body mass index (BMI) 35.0-35.9, adult: Secondary | ICD-10-CM | POA: Diagnosis not present

## 2018-01-02 DIAGNOSIS — M869 Osteomyelitis, unspecified: Secondary | ICD-10-CM

## 2018-01-02 DIAGNOSIS — M7052 Other bursitis of knee, left knee: Secondary | ICD-10-CM | POA: Insufficient documentation

## 2018-01-02 MED ORDER — DICLOFENAC SODIUM 1 % TD GEL: TRANSDERMAL | 2 refills | Status: DC

## 2018-01-02 NOTE — Assessment & Plan Note (Signed)
Congratulated her on her continued weight loss.  Recommend continue to increase her physical activity as tolerated.

## 2018-01-02 NOTE — Assessment & Plan Note (Signed)
She reports feeling significantly better and is able to ambulate without significant difficulties at this time.  She is interested in getting a gram exercise program and will refer to physical therapy for

## 2018-01-02 NOTE — Assessment & Plan Note (Signed)
Mild TTP over the pes bursa on the left.  We will have her start with topical diclofenac as well as therapeutic exercises with physical therapy.  Follow-up if any worsening symptoms
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
## 2018-01-03 ENCOUNTER — Encounter: Payer: Self-pay | Admitting: Physical Therapy

## 2018-01-03 ENCOUNTER — Ambulatory Visit: Payer: Medicare HMO | Admitting: Physical Therapy

## 2018-01-03 DIAGNOSIS — M869 Osteomyelitis, unspecified: Secondary | ICD-10-CM

## 2018-01-03 NOTE — Therapy (Signed)
[facility] [address] [place], [place], [address] Phone: [phone]   Fax:  [phone]  Physical Therapy Evaluation  Patient Details  Name: Angie Velasquez MRN: [id number] Date of Birth: Dec 27, 1945 Referring Provider: Teresa Coombs   Encounter Date: 01/03/2018  PT End of Session - 01/03/18 1457    Visit Number  1    Number of Visits  12    Date for PT Re-Evaluation  02/14/18    Authorization Type  [employer] medicare    PT Start Time  1305    PT Stop Time  1350    PT Time Calculation (min)  45 min    Activity Tolerance  Patient tolerated treatment well    Behavior During Therapy  [facility] for tasks assessed/performed       Past Medical History:  Diagnosis Date  . Anxiety    years ago - no longer an issue  . Arthritis    gout  . Cataracts, bilateral   . Complication of anesthesia   . Fracture of pelvis ([place])   . GERD (gastroesophageal reflux disease)   . Hx of chest pain    "anxiety"  . Hyperlipidemia   . INSOMNIA 10/14/2008   in past  . Joint pain   . Kidney problem   . Low blood sugar    eats several smaller meals a day  . Obesity   . Palpitations    Stress related   . Proteinuria    H/O  . Tobacco abuse    quit smoking 08/2013    Past Surgical History:  Procedure Laterality Date  . ABDOMINAL HYSTERECTOMY    . APPENDECTOMY     with hysterectomy  . childbirth     x4  . CHOLECYSTECTOMY  2001  . EYE SURGERY Bilateral 2015   cataract surgery with lens implant  . NEPHRECTOMY  1992   complex cystic mass - nephrectomy right  . ORIF ANKLE FRACTURE Right 07/02/2014   Procedure: OPEN REDUCTION INTERNAL FIXATION (ORIF) RIGHT ANKLE BIMALLOELAR FRACTURE;  Surgeon: Wylene Simmer, MD;  Location: [facility];  Service: Orthopedics;  Laterality: Right;  . OTHER SURGICAL HISTORY     hysterectomy, R ovary remains  . SHOULDER SURGERY     bone spurs left    There were no vitals filed for this visit.   Subjective  Assessment - 01/03/18 1409    Subjective  Pt states 1 year history of groin pain. She had previous imaging and was told she needed hip replacement. She has had continued pain, despite injections to hip/greater trochanter region. She has been in continued pain,and was using walker. She recently had another Dr. follow up, given injection in pubic symphasis region, which has given her relief. Imaging does not reveal hip joint degeneration. Pt has had significant pain with standing, walking, and stairs. She does not work, but cares for husband and his adult sone, appears stressed about home situation.      Limitations  Standing;Walking;House hold activities    Currently in Pain?  Yes    Pain Score  2     Pain Location  Pelvis    Pain Orientation  Other (Comment)    Pain Descriptors / Indicators  Aching;Dull;Tender    Pain Type  Chronic pain    Pain Onset  More than a month ago    Pain Frequency  Intermittent    Aggravating Factors   Standing, walking, stairs     Pain Relieving Factors  Rest  [facility] PT Assessment - 01/03/18 0001      Assessment   Medical Diagnosis  Hip Pain/Pubic Symphasis pain    Referring Provider  Teresa Coombs    Prior Therapy  None      Precautions   Precautions  None      Balance Screen   Has the patient fallen in the past 6 months  No      Prior Function   Level of Independence  Independent      Cognition   Overall Cognitive Status  Within Functional Limits for tasks assessed      ROM / Strength   AROM / PROM / Strength  AROM;Strength      AROM   Overall AROM Comments  Hip: WFL,  Lumbar: WFL      Strength   Overall Strength Comments  Hip: 4-/5;       Palpation   Palpation comment  Pain with palpation of pubic symphysis L>R. Tenderness along groin on L and R;  No change in symptoms with shot gun; No pain in greater trochanter region;  L LE appears slightly longer than R in supine; Hip PROM: WNL             Objective measurements  completed on examination: See above findings.      [place] Adult PT Treatment/Exercise - 01/03/18 0001      Exercises   Exercises  Knee/Hip      Knee/Hip Exercises: Stretches   Active Hamstring Stretch  3 reps;30 seconds      Knee/Hip Exercises: Supine   Hip Adduction Isometric  10 reps    Hip Adduction Isometric Limitations  Ball squeeze in hooklying              PT Education - 01/03/18 1457    Education provided  Yes    Education Details  POC for PT, assessment     Person(s) Educated  Patient    Methods  Explanation    Comprehension  Verbalized understanding       PT Short Term Goals - 01/03/18 1504      PT SHORT TERM GOAL #1   Title  Pt to be independent with initial HEP     Time  2    Period  Weeks    Status  New    Target Date  01/17/18        PT Long Term Goals - 01/03/18 1505      PT LONG TERM GOAL #1   Title  Pt to report pain 0-2/10 with standing, walking and stairs , to improve ability for community and home navigation     Time  6    Period  Weeks    Status  New    Target Date  02/14/18      PT LONG TERM GOAL #2   Title  Pt to demo increased strenth of Bilateral hips to at least 4/5, to improve stability and pain.     Time  6    Period  Weeks    Status  New    Target Date  02/14/18      PT LONG TERM GOAL #3   Title  Pt to demo ambulation mechanics to be [name], for up to .5 mi, without Assistive device.     Time  6    Period  Weeks    Status  New    Target Date  02/14/18  Plan - 01/03/18 1459    Clinical Impression Statement  Pt presents with primary complaint of increased pain in pubic symphysis, which is somewhat improved from recent injection She has tenderness to palpate, especially L side. She demonstrates hip ROM WNL, but does have weakness in hips and core. L LE appears longer in supine position, will continue to assess this, for true leg length or pelvic rotation. Pt with decreased mechanics and endurance for  ambulation, due to pain. She has deficits that are effecting functional activity level and community activities. Pt to benefit from PT to improve deficits, and return to PLOF without pain.     Clinical Presentation  Evolving    Clinical Decision Making  Moderate    Rehab Potential  Good    PT Frequency  2x / week    PT Duration  6 weeks    PT Treatment/Interventions  ADLs/Self Care Home Management;Cryotherapy;Electrical Stimulation;Moist Heat;Therapeutic exercise;Therapeutic activities;Functional mobility training;Stair training;Gait training;Ultrasound;Balance training;Neuromuscular re-education;Patient/family education;Orthotic Fit/Training;Manual techniques;Taping;Passive range of motion    PT Next Visit Plan  HEP, pelvic stabilization, leg length test        Patient will benefit from skilled therapeutic intervention in order to improve the following deficits and impairments:  Abnormal gait, Decreased endurance, Decreased activity tolerance, Decreased strength, Pain, Difficulty walking, Decreased mobility, Decreased balance  Visit Diagnosis: Osteitis pubis ([place]) - Plan: PT plan of care cert/re-cert     Problem List Patient Active Problem List   Diagnosis Date Noted  . Pes anserinus bursitis of left knee 01/02/2018  . Osteitis pubis ([place]) 12/19/2017  . Prediabetes 10/22/2017  . Other fatigue 09/18/2017  . Shortness of breath on exertion 09/18/2017  . Vitamin D deficiency 09/18/2017  . Hyperglycemia 09/18/2017  . Class 2 severe obesity with serious comorbidity and body mass index (BMI) of 35.0 to 35.9 in adult ([place]) 06/13/2017  . Atypical chest pain 03/26/2017  . Reactive depression 12/19/2016  . Anxiety state 07/20/2016  . Essential hypertension 01/04/2016  . Eczema 06/29/2015  . Gout 03/12/2015  . S/p nephrectomy 03/12/2015  . Hyperlipidemia 03/12/2015  . Tinnitus 03/12/2015  . COPD (chronic obstructive pulmonary disease) ([place]) 03/12/2015  . History of colonic polyps  03/12/2015  . Bimalleolar fracture 07/02/2014  . Arthritis   . Former smoker   . GERD 01/23/2008  . MICROALBUMINURIA 01/23/2008  . Microscopic hematuria 01/23/2008  . UTI (urinary tract infection) 01/08/2008    Lyndee Hensen, PT, DPT 3:18 PM  01/03/18    [facility] [place], [place], [address] Phone: [phone]   Fax:  [phone]  Name: Angie Velasquez MRN: [id number] Date of Birth: December 01, 1946

## 2018-01-07 ENCOUNTER — Encounter: Payer: Self-pay | Admitting: Sports Medicine

## 2018-01-09 NOTE — Progress Notes (Unsigned)
Subjective:   Angie Velasquez is a 72 y.o. female who presents for an Initial Medicare Annual Wellness Visit.  Reports health as Spouse has Alz    Diet Weight 234 BMI 35 Chol 161; hdl 58; trig 233 A1c 5.7; BS 117    Exercise  There are no preventive care reminders to display for this patient.  Colonoscopy 05/2015  - due 05/2020 Mammogram 07/2017 Dexa  09/2017  -0.9  Educated regarding shingrix 03/2015 - zoster    ETOH Tobacco - quit 2014; 5 years in Sept 35 pack year hx       Objective:    There were no vitals filed for this visit. There is no height or weight on file to calculate BMI.  Advanced Directives 01/03/2018 06/16/2017 04/28/2015  Does Patient Have a Medical Advance Directive? Yes Yes Yes  Type of Advance Directive - [place];Living will [place];Living will  Does patient want to make changes to medical advance directive? No - Patient declined - No - Patient declined  Copy of [place] in Chart? - No - copy requested No - copy requested    Current Medications (verified) Outpatient Encounter Medications as of 01/10/2018  Medication Sig  . acetaminophen (TYLENOL) 500 MG tablet Take 500 mg by mouth every 6 (six) hours as needed.  [name] allopurinol (ZYLOPRIM) 300 MG tablet TAKE 1 TABLET (300 MG TOTAL) 2 TIMES DAILY.  [name] aspirin 81 MG tablet Take 81 mg by mouth daily.  [name] buPROPion (WELLBUTRIN SR) 200 MG 12 hr tablet Take 1 tablet (200 mg total) by mouth daily at 12 noon.  [name] CALCIUM-MAGNESIUM-ZINC PO Take by mouth.  . clonazePAM (KLONOPIN) 0.5 MG tablet Take 1 tablet (0.5 mg total) by mouth at bedtime.  . diazepam (VALIUM) 10 MG tablet Take 10 mg by mouth every 6 (six) hours as needed (as needed for muscle spasms).  . diclofenac sodium (VOLTAREN) 1 % GEL Apply topically to affected area qid  . lisinopril (PRINIVIL,ZESTRIL) 2.5 MG tablet Take 1 tablet (2.5 mg total) by mouth daily.  [name] OVER THE COUNTER  MEDICATION Cherry extract bid  . OVER THE COUNTER MEDICATION B-12 one daily  . OVER THE COUNTER MEDICATION Vitamin D  . sertraline (ZOLOFT) 50 MG tablet Take 1 tablet (50 mg total) by mouth daily.  . simvastatin (ZOCOR) 20 MG tablet TAKE 1 TABLET AT BEDTIME  . Sodium Bicarbonate (NICE PURE BAKING SODA) POWD by Does not apply route.  . traMADol (ULTRAM) 50 MG tablet Take 1 tablet (50 mg total) by mouth every 6 (six) hours as needed for moderate pain or severe pain.  [name] triamcinolone cream (KENALOG) 0.1 % Apply 1 application topically 2 (two) times daily. For 7-10 days for recurring eczema issues   No facility-administered encounter medications on file as of 01/10/2018.     Allergies (verified) Epinephrine; Codeine; and Penicillins   History: Past Medical History:  Diagnosis Date  . Anxiety    years ago - no longer an issue  . Arthritis    gout  . Cataracts, bilateral   . Complication of anesthesia   . Fracture of pelvis ([place])   . GERD (gastroesophageal reflux disease)   . Hx of chest pain    "anxiety"  . Hyperlipidemia   . INSOMNIA 10/14/2008   in past  . Joint pain   . Kidney problem   . Low blood sugar    eats several smaller meals a day  .  Obesity   . Palpitations    Stress related   . Proteinuria    H/O  . Tobacco abuse    quit smoking 08/2013   Past Surgical History:  Procedure Laterality Date  . ABDOMINAL HYSTERECTOMY    . APPENDECTOMY     with hysterectomy  . childbirth     x4  . CHOLECYSTECTOMY  2001  . EYE SURGERY Bilateral 2015   cataract surgery with lens implant  . NEPHRECTOMY  1992   complex cystic mass - nephrectomy right  . ORIF ANKLE FRACTURE Right 07/02/2014   Procedure: OPEN REDUCTION INTERNAL FIXATION (ORIF) RIGHT ANKLE BIMALLOELAR FRACTURE;  Surgeon: Wylene Simmer, MD;  Location: [place];  Service: Orthopedics;  Laterality: Right;  . OTHER SURGICAL HISTORY     hysterectomy, R ovary remains  . SHOULDER SURGERY     bone spurs left   Family History   Problem Relation Age of Onset  . Stroke Father   . Alzheimer's disease Father        early, states also parkinsons. died 30  . Hypertension Father   . Obesity Father   . Ovarian cancer Mother        Dr. Jana Hakim   . Arthritis Mother   . Obesity Mother   . Colon cancer Paternal Grandmother   . Liver disease Daughter        On hospice age 1   Social History   Socioeconomic History  . Marital status: Married    Spouse name: Al Bunge  . Number of children: 4  . Years of education: Not on file  . Highest education level: Not on file  Social Needs  . Financial resource strain: Not on file  . Food insecurity - worry: Not on file  . Food insecurity - inability: Not on file  . Transportation needs - medical: Not on file  . Transportation needs - non-medical: Not on file  Occupational History  . Occupation: retired  Tobacco Use  . Smoking status: Former Smoker    Packs/day: 1.00    Years: 35.00    Pack years: 35.00    Types: Cigarettes    Last attempt to quit: 09/01/2013    Years since quitting: 4.3  . Smokeless tobacco: Never Used  . Tobacco comment: Former smoker with no desire to smoke again. Smoking years edited at patients request to include periods when she did not smoke.  Substance and Sexual Activity  . Alcohol use: Yes    Alcohol/week: 0.6 oz    Types: 1 Shots of liquor per week  . Drug use: No  . Sexual activity: Not on file  Other Topics Concern  . Not on file  Social History Narrative   Divorced. Found love of her life recently in 2016 (Al). 3 sons. 5 grandkids.    Daughter (had at age 50 and was adopted). To care for [name] her granddaughter when daughter passes- daughter on hospice.       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 Smoking years edited at patients request to include periods when she did not smoke.   Clinical Intake:    Activities of Daily Living No flowsheet data found.  Immunizations and Health Maintenance Immunization History  Administered Date(s) Administered  . Influenza, High Dose Seasonal PF 09/29/2016, 09/05/2017  . Influenza,inj,Quad PF,6+ Mos 08/24/2015  . Pneumococcal Conjugate-13 03/12/2015  . Pneumococcal Polysaccharide-23 01/09/2017  . Td 04/24/2016  . Zoster 03/12/2015   There are no preventive care reminders to display for this patient.  Patient Care Team: Marin Olp, MD as PCP - General (Family Medicine)  Indicate any recent Medical Services you may have received from other than [facility] providers in the past year (date may be approximate).     Assessment:   This is a routine wellness examination for Alissa.  Hearing/Vision screen No exam data present  Dietary issues and exercise activities discussed:    Goals    None     Depression Screen PHQ 2/9 Scores 09/18/2017 09/05/2017 03/26/2017 03/26/2017 03/14/2016 03/12/2015  PHQ - 2 Score 2 4 1  0 0 0  PHQ- 9 Score 7 5 - - - -    Fall Risk Fall Risk  03/26/2017 03/26/2017 03/14/2016 03/12/2015  Falls in the past year? No No No No      Cognitive Function:   Ad8 score reviewed for issues:  Issues making decisions:  Less interest in hobbies / activities:  Repeats questions, stories (family complaining):  Trouble using ordinary gadgets (microwave, computer, phone):  Forgets the month or year:   Mismanaging finances:   Remembering appts:  Daily problems with thinking and/or memory: Ad8 score is=          Screening Tests Health Maintenance  Topic Date Due  . MAMMOGRAM  07/20/2019  . COLONOSCOPY  05/11/2020  . TETANUS/TDAP  04/24/2026  . INFLUENZA VACCINE  Completed  . DEXA SCAN  Completed  . Hepatitis C Screening   Completed  . PNA vac Low Risk Adult  Completed    Qualifies for Shingles Vaccine? ***  :      Plan:     PCP Notes ***  Health Maintenance ***  Abnormal Screens  ***  Referrals  ***  Patient concerns; ***  Nurse Concerns; ***  Next PCP apt ***      I have personally reviewed and noted the following in the patient's chart:   . Medical and social history . Use of alcohol, tobacco or illicit drugs  . Current medications and supplements . Functional ability and status . Nutritional status . Physical activity . Advanced directives . List of other physicians . Hospitalizations, surgeries, and ER visits in previous 12 months . Vitals . Screenings to include cognitive, depression, and falls . Referrals and appointments  In addition, I have reviewed and discussed with patient certain preventive protocols, quality metrics, and best practice recommendations. A written personalized care plan for preventive services as well as general preventive health recommendations were provided to patient.     Wynetta Fines, RN   01/09/2018

## 2018-01-10 ENCOUNTER — Ambulatory Visit (INDEPENDENT_AMBULATORY_CARE_PROVIDER_SITE_OTHER): Payer: Medicare HMO | Admitting: Family Medicine

## 2018-01-10 ENCOUNTER — Ambulatory Visit: Payer: Medicare HMO | Admitting: *Deleted

## 2018-01-10 ENCOUNTER — Encounter: Payer: Self-pay | Admitting: Family Medicine

## 2018-01-10 DIAGNOSIS — J449 Chronic obstructive pulmonary disease, unspecified: Secondary | ICD-10-CM | POA: Diagnosis not present

## 2018-01-10 DIAGNOSIS — F411 Generalized anxiety disorder: Secondary | ICD-10-CM | POA: Diagnosis not present

## 2018-01-10 DIAGNOSIS — F329 Major depressive disorder, single episode, unspecified: Secondary | ICD-10-CM | POA: Diagnosis not present

## 2018-01-10 DIAGNOSIS — E785 Hyperlipidemia, unspecified: Secondary | ICD-10-CM

## 2018-01-10 DIAGNOSIS — L304 Erythema intertrigo: Secondary | ICD-10-CM | POA: Diagnosis not present

## 2018-01-10 DIAGNOSIS — Z6835 Body mass index (BMI) 35.0-35.9, adult: Secondary | ICD-10-CM | POA: Diagnosis not present

## 2018-01-10 DIAGNOSIS — I10 Essential (primary) hypertension: Secondary | ICD-10-CM

## 2018-01-10 MED ORDER — NYSTATIN 100000 UNIT/GM EX CREA
1.0000 | TOPICAL_CREAM | Freq: Two times a day (BID) | CUTANEOUS | 2 refills | Status: DC
Start: 2018-01-10 — End: 2019-03-12

## 2018-01-10 MED ORDER — TRIAMCINOLONE ACETONIDE 0.1 % EX CREA: 1.0000 "application " | TOPICAL_CREAM | Freq: Two times a day (BID) | CUTANEOUS | 1 refills | Status: AC

## 2018-01-10 MED ORDER — ALLOPURINOL 300 MG PO TABS: ORAL_TABLET | ORAL | 3 refills | Status: DC

## 2018-01-10 NOTE — Patient Instructions (Addendum)
Great job on weight loss! Keep up the good work  No changes in medicine today

## 2018-01-10 NOTE — Assessment & Plan Note (Signed)
S: feels supported by friend who is a Retail buyer with MSW. She is on zolof 50mg  and also on wellbutrin 200mg  SR through healthy weight loss to try to help with appetite  Primary driver of stress is husband with mild cognitive impariment, hoarding, change in personality over last few years - far less motivatoin.  Depression screen [facility] 2/9 01/10/2018 09/18/2017 09/05/2017 03/26/2017 03/26/2017  Decreased Interest 1 1 2  0 0  Down, Depressed, Hopeless 2 1 2 1  0  PHQ - 2 Score 3 2 4 1  0  Altered sleeping 0 0 0 - -  Tired, decreased energy 0 1 1 - -  Change in appetite 0 1 0 - -  Feeling bad or failure about yourself  0 1 0 - -  Trouble concentrating 0 1 0 - -  Moving slowly or fidgety/restless 0 0 0 - -  Suicidal thoughts 0 1 0 - -  PHQ-9 Score 3 7 5  - -  Difficult doing work/chores Somewhat difficult Not difficult at all Somewhat difficult - -  A/P: reasonable control. Encouraged her to continue current therapy. Extended counseling provided today about stressors

## 2018-01-10 NOTE — Progress Notes (Signed)
Subjective:  Angie Velasquez is a 72 y.o. year old very pleasant female patient who presents for/with See problem oriented charting ROS- admits to high levels of stress. No chest pain or shortness of breath. No increased edema. No fever or chills.   Past Medical History-  Patient Active Problem List   Diagnosis Date Noted  . Reactive depression 12/19/2016    Priority: High  . Anxiety state 07/20/2016    Priority: High  . S/p nephrectomy 03/12/2015    Priority: High  . Essential hypertension 01/04/2016    Priority: Medium  . Gout 03/12/2015    Priority: Medium  . Hyperlipidemia 03/12/2015    Priority: Medium  . COPD (chronic obstructive pulmonary disease) ([place]) 03/12/2015    Priority: Medium  . Arthritis     Priority: Medium  . Former smoker     Priority: Medium  . MICROALBUMINURIA 01/23/2008    Priority: Medium  . Eczema 06/29/2015    Priority: Low  . Tinnitus 03/12/2015    Priority: Low  . History of colonic polyps 03/12/2015    Priority: Low  . Bimalleolar fracture 07/02/2014    Priority: Low  . GERD 01/23/2008    Priority: Low  . Microscopic hematuria 01/23/2008    Priority: Low  . UTI (urinary tract infection) 01/08/2008    Priority: Low  . Intertrigo 01/10/2018  . Pes anserinus bursitis of left knee 01/02/2018  . Osteitis pubis ([place]) 12/19/2017  . Prediabetes 10/22/2017  . Other fatigue 09/18/2017  . Shortness of breath on exertion 09/18/2017  . Vitamin D deficiency 09/18/2017  . Hyperglycemia 09/18/2017  . Class 2 severe obesity with serious comorbidity and body mass index (BMI) of 35.0 to 35.9 in adult ([place]) 06/13/2017  . Atypical chest pain 03/26/2017    Medications- reviewed and updated Current Outpatient Medications  Medication Sig Dispense Refill  . acetaminophen (TYLENOL) 500 MG tablet Take 500 mg by mouth every 6 (six) hours as needed.    [name] allopurinol (ZYLOPRIM) 300 MG tablet TAKE 1 TABLET (300 MG TOTAL) ONCE A DAY 90 tablet 3  . aspirin  81 MG tablet Take 81 mg by mouth daily.    [name] buPROPion (WELLBUTRIN SR) 200 MG 12 hr tablet Take 1 tablet (200 mg total) by mouth daily at 12 noon. 30 tablet 0  . CALCIUM-MAGNESIUM-ZINC PO Take by mouth.    . clonazePAM (KLONOPIN) 0.5 MG tablet Take 1 tablet (0.5 mg total) by mouth at bedtime. 90 tablet 1  . diazepam (VALIUM) 10 MG tablet Take 10 mg by mouth every 6 (six) hours as needed (as needed for muscle spasms).    . diclofenac sodium (VOLTAREN) 1 % GEL Apply topically to affected area qid 100 g 2  . lisinopril (PRINIVIL,ZESTRIL) 2.5 MG tablet Take 1 tablet (2.5 mg total) by mouth daily. 90 tablet 3  . OVER THE COUNTER MEDICATION Cherry extract bid    . OVER THE COUNTER MEDICATION B-12 one daily    . OVER THE COUNTER MEDICATION Vitamin D    . sertraline (ZOLOFT) 50 MG tablet Take 1 tablet (50 mg total) by mouth daily. 90 tablet 3  . simvastatin (ZOCOR) 20 MG tablet TAKE 1 TABLET AT BEDTIME 90 tablet 3  . Sodium Bicarbonate (NICE PURE BAKING SODA) POWD by Does not apply route.    . traMADol (ULTRAM) 50 MG tablet Take 1 tablet (50 mg total) by mouth every 6 (six) hours as needed for moderate pain or severe pain. 20 tablet 0  .  triamcinolone cream (KENALOG) 0.1 % Apply 1 application topically 2 (two) times daily. For 7-10 days for recurring eczema issues 80 g 1  . nystatin cream (MYCOSTATIN) Apply 1 application topically 2 (two) times daily. To rash in groin for up to 2 weeks maximum 90 g 2   No current facility-administered medications for this visit.      Objective: BP 100/68 (BP Location: Left Arm, Patient Position: Sitting, Cuff Size: Large)   Pulse 69   Temp 98.2 F (36.8 C) (Oral)   SpO2 95%  Gen: NAD, resting comfortably CV: RRR no murmurs rubs or gallops Lungs: CTAB no crackles, wheeze, rhonchi Abdomen: soft/nontender/nondistended/normal bowel sounds. obease Ext: no edema Skin: warm, dry  Assessment/Plan:  Hyperlipidemia S: reasonably controlled on simvastatin 20mg  with  LDL <100. No myalgias.  Lab Results  Component Value Date   CHOL 161 09/18/2017   HDL 58 09/18/2017   LDLCALC 56 09/18/2017   LDLDIRECT 77.0 01/18/2017   TRIG 233 (H) 09/18/2017   CHOLHDL 2 01/18/2017   A/P: continue current medications  Essential hypertension S: controlled on lisinopril 2.5 mg- though primarily on for renal protective effects with history microalbuminuria BP Readings from Last 3 Encounters:  01/10/18 100/68  01/02/18 114/80  12/31/17 94/67  A/P: We discussed blood pressure goal of <140/90. Continue current meds  Reactive depression S: feels supported by friend who is a Retail buyer with MSW. She is on zolof 50mg  and also on wellbutrin 200mg  SR through healthy weight loss to try to help with appetite  Primary driver of stress is husband with mild cognitive impariment, hoarding, change in personality over last few years - far less motivatoin.  Depression screen [facility] 2/9 01/10/2018 09/18/2017 09/05/2017 03/26/2017 03/26/2017  Decreased Interest 1 1 2  0 0  Down, Depressed, Hopeless 2 1 2 1  0  PHQ - 2 Score 3 2 4 1  0  Altered sleeping 0 0 0 - -  Tired, decreased energy 0 1 1 - -  Change in appetite 0 1 0 - -  Feeling bad or failure about yourself  0 1 0 - -  Trouble concentrating 0 1 0 - -  Moving slowly or fidgety/restless 0 0 0 - -  Suicidal thoughts 0 1 0 - -  PHQ-9 Score 3 7 5  - -  Difficult doing work/chores Somewhat difficult Not difficult at all Somewhat difficult - -  A/P: reasonable control. Encouraged her to continue current therapy. Extended counseling provided today about stressors  Anxiety state Still using Clonazepam - Uses regularly for sleep Diazepam spasms in neck about once a year  Class 2 severe obesity with serious comorbidity and body mass index (BMI) of 35.0 to 35.9 in adult (HCC) down 16 lbs on home scales. Doesn't want to weigh here- husband will ride her on weight about it being too high even though she has made progress. Encouraged  her to continue her excellent progress   Future Appointments  Date Time Provider [place]  01/15/2018 12:15 PM HORSE PEN SUB THERAPIST A [facility]  01/16/2018  2:00 PM Waldon Merl, PA-C [facility] None  01/17/2018  1:00 PM HORSE PEN SUB THERAPIST A [facility]  03/13/2018  1:00 PM Gerda Diss, DO [facility]   We did not set firm follow up- needs to return for AWV. 4-6 months with me is reasonable  Meds ordered this encounter  Medications  . triamcinolone cream (KENALOG) 0.1 %    Sig: Apply 1 application topically 2 (two)  times daily. For 7-10 days for recurring eczema issues    Dispense:  80 g    Refill:  1  . allopurinol (ZYLOPRIM) 300 MG tablet    Sig: TAKE 1 TABLET (300 MG TOTAL) ONCE A DAY    Dispense:  90 tablet    Refill:  3  . nystatin cream (MYCOSTATIN)    Sig: Apply 1 application topically 2 (two) times daily. To rash in groin for up to 2 weeks maximum    Dispense:  90 g    Refill:  2   Time Stamp The duration of face-to-face time during this visit was greater than 25 minutes. Greater than 50% of this time was spent in counseling, explanation of diagnosis, planning of further management, and/or coordination of care including discussion of caregiver stress and stressors in general with her husband including financial stressors from his decision making  Return precautions advised.  Garret Reddish, MD

## 2018-01-10 NOTE — Assessment & Plan Note (Signed)
down 16 lbs on home scales. Doesn't want to weigh here- husband will ride her on weight about it being too high even though she has made progress. Encouraged her to continue her excellent progress

## 2018-01-10 NOTE — Assessment & Plan Note (Signed)
S: controlled on lisinopril 2.5 mg- though primarily on for renal protective effects with history microalbuminuria BP Readings from Last 3 Encounters:  01/10/18 100/68  01/02/18 114/80  12/31/17 94/67  A/P: We discussed blood pressure goal of <140/90. Continue current meds

## 2018-01-10 NOTE — Assessment & Plan Note (Signed)
Still using Clonazepam - Uses regularly for sleep Diazepam spasms in neck about once a year

## 2018-01-10 NOTE — Assessment & Plan Note (Signed)
S: reasonably controlled on simvastatin 20mg  with LDL <100. No myalgias.  Lab Results  Component Value Date   CHOL 161 09/18/2017   HDL 58 09/18/2017   LDLCALC 56 09/18/2017   LDLDIRECT 77.0 01/18/2017   TRIG 233 (H) 09/18/2017   CHOLHDL 2 01/18/2017   A/P: continue current medications

## 2018-01-15 ENCOUNTER — Ambulatory Visit: Payer: Medicare HMO | Admitting: Physical Therapy

## 2018-01-15 ENCOUNTER — Encounter: Payer: Self-pay | Admitting: Physical Therapy

## 2018-01-15 DIAGNOSIS — M869 Osteomyelitis, unspecified: Secondary | ICD-10-CM

## 2018-01-15 NOTE — Therapy (Signed)
[facility] [address] [place], [place], [address] Phone: [phone]   Fax:  [phone]  Physical Therapy Treatment  Patient Details  Name: Angie Velasquez MRN: [id number] Date of Birth: 1946-01-12 Referring Provider: Teresa Coombs   Encounter Date: 01/15/2018  PT End of Session - 01/15/18 1314    Visit Number  2    Number of Visits  12    Date for PT Re-Evaluation  02/14/18    Authorization Type  [employer] medicare    PT Start Time  1215    PT Stop Time  1247    PT Time Calculation (min)  32 min    Activity Tolerance  Patient tolerated treatment well    Behavior During Therapy  [facility] for tasks assessed/performed       Past Medical History:  Diagnosis Date  . Anxiety    years ago - no longer an issue  . Arthritis    gout  . Cataracts, bilateral   . Complication of anesthesia   . Fracture of pelvis ([place])   . GERD (gastroesophageal reflux disease)   . Hx of chest pain    "anxiety"  . Hyperlipidemia   . INSOMNIA 10/14/2008   in past  . Joint pain   . Kidney problem   . Low blood sugar    eats several smaller meals a day  . Obesity   . Palpitations    Stress related   . Proteinuria    H/O  . Tobacco abuse    quit smoking 08/2013    Past Surgical History:  Procedure Laterality Date  . ABDOMINAL HYSTERECTOMY    . APPENDECTOMY     with hysterectomy  . childbirth     x4  . CHOLECYSTECTOMY  2001  . EYE SURGERY Bilateral 2015   cataract surgery with lens implant  . NEPHRECTOMY  1992   complex cystic mass - nephrectomy right  . ORIF ANKLE FRACTURE Right 07/02/2014   Procedure: OPEN REDUCTION INTERNAL FIXATION (ORIF) RIGHT ANKLE BIMALLOELAR FRACTURE;  Surgeon: Wylene Simmer, MD;  Location: [place];  Service: Orthopedics;  Laterality: Right;  . OTHER SURGICAL HISTORY     hysterectomy, R ovary remains  . SHOULDER SURGERY     bone spurs left    There were no vitals filed for this visit.  Subjective Assessment  - 01/15/18 1312    Subjective  Pt had significant pain after Eval. Pain did subside after a day or two, but she needed to use walker for ambulation. She has been able to do quite a bit of standing and walking without pain in last few days.     Limitations  Standing;Walking    Currently in Pain?  Yes    Pain Score  2     Pain Location  Pelvis    Pain Orientation  Other (Comment)    Pain Descriptors / Indicators  Aching    Pain Type  Chronic pain    Pain Onset  More than a month ago    Pain Frequency  Intermittent                      [facility] Adult PT Treatment/Exercise - 01/15/18 1222      Exercises   Exercises  Knee/Hip      Knee/Hip Exercises: Stretches   Active Hamstring Stretch  3 reps;30 seconds      Knee/Hip Exercises: Seated   Long Arc Quad  2 sets;10 reps;Both  Hamstring Curl  20 reps;Both    Hamstring Limitations  YTB      Knee/Hip Exercises: Supine   Hip Adduction Isometric  10 reps    Hip Adduction Isometric Limitations  Ball squeeze in hooklying     Bridges  10 reps    Other Supine Knee/Hip Exercises  Clam Shells YTB x20 (light)     Other Supine Knee/Hip Exercises  Glute set x20              PT Education - 01/15/18 1314    Education Details  HEP , pain relief, activity progression     Person(s) Educated  Patient    Methods  Explanation    Comprehension  Verbalized understanding       PT Short Term Goals - 01/03/18 1504      PT SHORT TERM GOAL #1   Title  Pt to be independent with initial HEP     Time  2    Period  Weeks    Status  New    Target Date  01/17/18        PT Long Term Goals - 01/03/18 1505      PT LONG TERM GOAL #1   Title  Pt to report pain 0-2/10 with standing, walking and stairs , to improve ability for community and home navigation     Time  6    Period  Weeks    Status  New    Target Date  02/14/18      PT LONG TERM GOAL #2   Title  Pt to demo increased strenth of Bilateral hips to at least 4/5, to  improve stability and pain.     Time  6    Period  Weeks    Status  New    Target Date  02/14/18      PT LONG TERM GOAL #3   Title  Pt to demo ambulation mechanics to be [facility], for up to .5 mi, without Assistive device.     Time  6    Period  Weeks    Status  New    Target Date  02/14/18            Plan - 01/15/18 1315    Clinical Impression Statement  Pt with no increased pain during session today. Pt education on going very easy with exercises. Ther ex progressed lightly/minimally today to try to decrease risk for pain after session. Plan to progress strength as tolerated, and based on pts pain response.     Rehab Potential  Good    PT Frequency  2x / week    PT Duration  6 weeks    PT Treatment/Interventions  ADLs/Self Care Home Management;Cryotherapy;Electrical Stimulation;Moist Heat;Therapeutic exercise;Therapeutic activities;Functional mobility training;Stair training;Gait training;Ultrasound;Balance training;Neuromuscular re-education;Patient/family education;Orthotic Fit/Training;Manual techniques;Taping;Passive range of motion    PT Next Visit Plan  HEP, pelvic stabilization, leg length test        Patient will benefit from skilled therapeutic intervention in order to improve the following deficits and impairments:  Abnormal gait, Decreased endurance, Decreased activity tolerance, Decreased strength, Pain, Difficulty walking, Decreased mobility, Decreased balance  Visit Diagnosis: Osteitis pubis [facility])     Problem List Patient Active Problem List   Diagnosis Date Noted  . Intertrigo 01/10/2018  . Pes anserinus bursitis of left knee 01/02/2018  . Osteitis pubis ([place]) 12/19/2017  . Prediabetes 10/22/2017  . Other fatigue 09/18/2017  . Shortness of breath on exertion 09/18/2017  . Vitamin  D deficiency 09/18/2017  . Hyperglycemia 09/18/2017  . Class 2 severe obesity with serious comorbidity and body mass index (BMI) of 35.0 to 35.9 in adult ([place]) 06/13/2017  .  Atypical chest pain 03/26/2017  . Reactive depression 12/19/2016  . Anxiety state 07/20/2016  . Essential hypertension 01/04/2016  . Eczema 06/29/2015  . Gout 03/12/2015  . S/p nephrectomy 03/12/2015  . Hyperlipidemia 03/12/2015  . Tinnitus 03/12/2015  . COPD (chronic obstructive pulmonary disease) ([place]) 03/12/2015  . History of colonic polyps 03/12/2015  . Bimalleolar fracture 07/02/2014  . Arthritis   . Former smoker   . GERD 01/23/2008  . MICROALBUMINURIA 01/23/2008  . Microscopic hematuria 01/23/2008  . UTI (urinary tract infection) 01/08/2008   Lyndee Hensen, PT, DPT 1:19 PM  01/15/18    [facility] [place], [place], [address] Phone: [phone]   Fax:  [phone]  Name: Angie Velasquez MRN: [id number] Date of Birth: Oct 21, 1946

## 2018-01-16 ENCOUNTER — Ambulatory Visit (INDEPENDENT_AMBULATORY_CARE_PROVIDER_SITE_OTHER): Payer: Medicare HMO | Admitting: Physician Assistant

## 2018-01-16 VITALS — BP 102/61 | HR 75 | Temp 98.0°F | Ht 68.0 in | Wt 234.0 lb

## 2018-01-16 DIAGNOSIS — F3289 Other specified depressive episodes: Secondary | ICD-10-CM | POA: Diagnosis not present

## 2018-01-16 DIAGNOSIS — E7849 Other hyperlipidemia: Secondary | ICD-10-CM

## 2018-01-16 DIAGNOSIS — Z6835 Body mass index (BMI) 35.0-35.9, adult: Secondary | ICD-10-CM | POA: Diagnosis not present

## 2018-01-16 MED ORDER — BUPROPION HCL ER (SR) 200 MG PO TB12: 200.0000 mg | ORAL_TABLET | Freq: Every day | ORAL | 0 refills | Status: DC

## 2018-01-16 NOTE — Progress Notes (Signed)
Office: [phone]  /  Fax: [phone]   HPI:   Chief Complaint: OBESITY Angie Velasquez is here to discuss her progress with her obesity treatment plan. She is on the Category 2 plan and is following her eating plan approximately 60 % of the time. She states she is doing physical therapy for 60 minutes 2 times per week. Angie Velasquez maintained her weight. She states she does not eat all the protein on the meal plan on on many occasions. She is traveling out of town and requests a longer follow up time.  Her weight is 234 lb (106.1 kg) today and has not lost weight since her last visit. She has lost 15 lbs since starting treatment with [name].  Hyperlipidemia Angie Velasquez has hyperlipidemia and has been trying to improve her cholesterol levels with intensive lifestyle modification including a low saturated fat diet, exercise and weight loss. She denies any chest pain, claudication or myalgias.  Depression with emotional eating behaviors Angie Velasquez is struggling with emotional eating and using food for comfort to the extent that it is negatively impacting her health. She often snacks when she is not hungry. Angie Velasquez sometimes feels she is out of control and then feels guilty that she made poor food choices. She has been working on behavior modification techniques to help reduce her emotional eating and has been somewhat successful. Her mood is stable and she shows no sign of suicidal or homicidal ideations.  Depression screen [facility] 2/9 01/10/2018 09/18/2017 09/05/2017 03/26/2017 03/26/2017  Decreased Interest 1 1 2  0 0  Down, Depressed, Hopeless 2 1 2 1  0  PHQ - 2 Score 3 2 4 1  0  Altered sleeping 0 0 0 - -  Tired, decreased energy 0 1 1 - -  Change in appetite 0 1 0 - -  Feeling bad or failure about yourself  0 1 0 - -  Trouble concentrating 0 1 0 - -  Moving slowly or fidgety/restless 0 0 0 - -  Suicidal thoughts 0 1 0 - -  PHQ-9 Score 3 7 5  - -  Difficult doing work/chores Somewhat difficult Not difficult at  all Somewhat difficult - -   ALLERGIES: Allergies  Allergen Reactions  . Epinephrine Palpitations  . Codeine Nausea Only  . Penicillins Nausea Only    MEDICATIONS: Current Outpatient Medications on File Prior to Visit  Medication Sig Dispense Refill  . acetaminophen (TYLENOL) 500 MG tablet Take 500 mg by mouth every 6 (six) hours as needed.    [name] allopurinol (ZYLOPRIM) 300 MG tablet TAKE 1 TABLET (300 MG TOTAL) ONCE A DAY 90 tablet 3  . aspirin 81 MG tablet Take 81 mg by mouth daily.    [name] buPROPion (WELLBUTRIN SR) 200 MG 12 hr tablet Take 1 tablet (200 mg total) by mouth daily at 12 noon. 30 tablet 0  . CALCIUM-MAGNESIUM-ZINC PO Take by mouth.    . clonazePAM (KLONOPIN) 0.5 MG tablet Take 1 tablet (0.5 mg total) by mouth at bedtime. 90 tablet 1  . diazepam (VALIUM) 10 MG tablet Take 10 mg by mouth every 6 (six) hours as needed (as needed for muscle spasms).    . diclofenac sodium (VOLTAREN) 1 % GEL Apply topically to affected area qid 100 g 2  . lisinopril (PRINIVIL,ZESTRIL) 2.5 MG tablet Take 1 tablet (2.5 mg total) by mouth daily. 90 tablet 3  . nystatin cream (MYCOSTATIN) Apply 1 application topically 2 (two) times daily. To rash in groin for up to 2 weeks maximum 90  g 2  . OVER THE COUNTER MEDICATION Cherry extract bid    . OVER THE COUNTER MEDICATION B-12 one daily    . OVER THE COUNTER MEDICATION Vitamin D    . sertraline (ZOLOFT) 50 MG tablet Take 1 tablet (50 mg total) by mouth daily. 90 tablet 3  . simvastatin (ZOCOR) 20 MG tablet TAKE 1 TABLET AT BEDTIME 90 tablet 3  . Sodium Bicarbonate (NICE PURE BAKING SODA) POWD by Does not apply route.    . traMADol (ULTRAM) 50 MG tablet Take 1 tablet (50 mg total) by mouth every 6 (six) hours as needed for moderate pain or severe pain. 20 tablet 0  . triamcinolone cream (KENALOG) 0.1 % Apply 1 application topically 2 (two) times daily. For 7-10 days for recurring eczema issues 80 g 1   No current facility-administered medications on  file prior to visit.     PAST MEDICAL HISTORY: Past Medical History:  Diagnosis Date  . Anxiety    years ago - no longer an issue  . Arthritis    gout  . Cataracts, bilateral   . Complication of anesthesia   . Fracture of pelvis ([place])   . GERD (gastroesophageal reflux disease)   . Hx of chest pain    "anxiety"  . Hyperlipidemia   . INSOMNIA 10/14/2008   in past  . Joint pain   . Kidney problem   . Low blood sugar    eats several smaller meals a day  . Obesity   . Palpitations    Stress related   . Proteinuria    H/O  . Tobacco abuse    quit smoking 08/2013    PAST SURGICAL HISTORY: Past Surgical History:  Procedure Laterality Date  . ABDOMINAL HYSTERECTOMY    . APPENDECTOMY     with hysterectomy  . childbirth     x4  . CHOLECYSTECTOMY  2001  . EYE SURGERY Bilateral 2015   cataract surgery with lens implant  . NEPHRECTOMY  1992   complex cystic mass - nephrectomy right  . ORIF ANKLE FRACTURE Right 07/02/2014   Procedure: OPEN REDUCTION INTERNAL FIXATION (ORIF) RIGHT ANKLE BIMALLOELAR FRACTURE;  Surgeon: Wylene Simmer, MD;  Location: [place];  Service: Orthopedics;  Laterality: Right;  . OTHER SURGICAL HISTORY     hysterectomy, R ovary remains  . SHOULDER SURGERY     bone spurs left    SOCIAL HISTORY: Social History   Tobacco Use  . Smoking status: Former Smoker    Packs/day: 1.00    Years: 35.00    Pack years: 35.00    Types: Cigarettes    Last attempt to quit: 09/01/2013    Years since quitting: 4.3  . Smokeless tobacco: Never Used  . Tobacco comment: Former smoker with no desire to smoke again. Smoking years edited at patients request to include periods when she did not smoke.  Substance Use Topics  . Alcohol use: Yes    Alcohol/week: 0.6 oz    Types: 1 Shots of liquor per week  . Drug use: No    FAMILY HISTORY: Family History  Problem Relation Age of Onset  . Stroke Father   . Alzheimer's disease Father        early, states also parkinsons.  died 1  . Hypertension Father   . Obesity Father   . Ovarian cancer Mother        Dr. Jana Hakim   . Arthritis Mother   . Obesity Mother   . Colon  cancer Paternal Grandmother   . Liver disease Daughter        On hospice age 34    ROS: Review of Systems  Constitutional: Negative for weight loss.  Cardiovascular: Negative for chest pain and claudication.  Musculoskeletal: Negative for myalgias.  Psychiatric/Behavioral: Positive for depression. Negative for suicidal ideas.    PHYSICAL EXAM: Blood pressure 102/61, pulse 75, temperature 98 F (36.7 C), temperature source Oral, height 5\' 8"  (1.727 m), weight 234 lb (106.1 kg), SpO2 93 %. Body mass index is 35.58 kg/m. Physical Exam  Constitutional: She is oriented to person, place, and time. She appears well-developed and well-nourished.  Cardiovascular: Normal rate.  Pulmonary/Chest: Effort normal.  Musculoskeletal: Normal range of motion.  Neurological: She is oriented to person, place, and time.  Skin: Skin is warm and dry.  Psychiatric: She has a normal mood and affect. Her behavior is normal.  Vitals reviewed.   RECENT LABS AND TESTS: BMET    Component Value Date/Time   NA 138 09/18/2017 1049   K 4.2 09/18/2017 1049   CL 98 09/18/2017 1049   CO2 23 09/18/2017 1049   GLUCOSE 96 09/18/2017 1049   GLUCOSE 109 (H) 01/18/2017 0902   BUN 16 09/18/2017 1049   CREATININE 1.19 (H) 09/18/2017 1049   CALCIUM 9.5 09/18/2017 1049   GFRNONAA 46 (L) 09/18/2017 1049   GFRAA 53 (L) 09/18/2017 1049   Lab Results  Component Value Date   HGBA1C 5.7 (H) 09/18/2017   HGBA1C (H) 04/15/2010    5.9 (NOTE)                                                                       According to the ADA Clinical Practice Recommendations for 2011, when HbA1c is used as a screening test:   >=6.5%   Diagnostic of Diabetes Mellitus           (if abnormal result  is confirmed)  5.7-6.4%   Increased risk of developing Diabetes Mellitus   References:Diagnosis and Classification of Diabetes Mellitus,Diabetes IWLN,9892,11(HERDE 1):S62-S69 and Standards of Medical Care in         Diabetes - 2011,Diabetes Care,2011,34  (Suppl 1):S11-S61.   HGBA1C 5.8 12/31/2007   Lab Results  Component Value Date   INSULIN 25.3 (H) 09/18/2017   CBC    Component Value Date/Time   WBC 6.4 09/18/2017 1049   WBC 6.6 01/18/2017 0902   RBC 4.57 09/18/2017 1049   RBC 4.39 01/18/2017 0902   HGB 13.5 09/18/2017 1049   HCT 40.4 09/18/2017 1049   PLT 240.0 01/18/2017 0902   MCV 88 09/18/2017 1049   MCH 29.5 09/18/2017 1049   MCH 29.4 07/02/2014 1143   MCHC 33.4 09/18/2017 1049   MCHC 33.2 01/18/2017 0902   RDW 15.2 09/18/2017 1049   LYMPHSABS 2.6 09/18/2017 1049   MONOABS 0.9 04/15/2010 2135   EOSABS 0.2 09/18/2017 1049   BASOSABS 0.0 09/18/2017 1049   Iron/TIBC/Ferritin/ %Sat No results found for: IRON, TIBC, FERRITIN, IRONPCTSAT Lipid Panel     Component Value Date/Time   CHOL 161 09/18/2017 1049   TRIG 233 (H) 09/18/2017 1049   HDL 58 09/18/2017 1049   CHOLHDL 2 01/18/2017 0902   VLDL 28.2 01/18/2017 0902  LDLCALC 56 09/18/2017 1049   LDLDIRECT 77.0 01/18/2017 0902   Hepatic Function Panel     Component Value Date/Time   PROT 6.5 09/18/2017 1049   ALBUMIN 4.7 09/18/2017 1049   AST 35 09/18/2017 1049   ALT 42 (H) 09/18/2017 1049   ALKPHOS 92 09/18/2017 1049   BILITOT 0.3 09/18/2017 1049   BILIDIR <0.1 04/15/2010 2135   IBILI NOT CALCULATED 04/15/2010 2135      Component Value Date/Time   TSH 3.430 09/18/2017 1049   TSH 2.09 05/25/2015 0820   TSH 1.89 12/31/2007 0831    ASSESSMENT AND PLAN: Other hyperlipidemia  Other depression - with emotional eating  - Plan: buPROPion (WELLBUTRIN SR) 200 MG 12 hr tablet  Class 2 severe obesity with serious comorbidity and body mass index (BMI) of 35.0 to 35.9 in adult, unspecified obesity type ([place])  PLAN:  Hyperlipidemia Angie Velasquez was informed of the American Heart  Association Guidelines emphasizing intensive lifestyle modifications as the first line treatment for hyperlipidemia. We discussed many lifestyle modifications today in depth, and Angie Velasquez will continue to work on decreasing saturated fats such as fatty red meat, butter and many fried foods. She will also increase vegetables and lean protein in her diet and continue to work on exercise and weight loss efforts. Alisea agrees to continue her medications as prescribed and she agrees to follow up with our clinic in 4 weeks.  Depression with Emotional Eating Behaviors We discussed behavior modification techniques today to help Angie Velasquez deal with her emotional eating and depression. Angie Velasquez agrees to continue taking Wellbutrin SR 200 mg qd #30 and we will refill for 1 month. Angie Velasquez agrees to follow up with our clinic in 4 weeks.  Obesity Angie Velasquez is currently in the action stage of change. As such, her goal is to continue with weight loss efforts She has agreed to portion control better and make smarter food choices, such as increase vegetables and decrease simple carbohydrates  Angie Velasquez has been instructed to work up to a goal of 150 minutes of combined cardio and strengthening exercise per week for weight loss and overall health benefits. We discussed the following Behavioral Modification Strategies today: increasing lean protein intake and travel eating strategies   Angie Velasquez has agreed to follow up with our clinic in 4 weeks. She was informed of the importance of frequent follow up visits to maximize her success with intensive lifestyle modifications for her multiple health conditions.   OBESITY BEHAVIORAL INTERVENTION VISIT  Today's visit was # 7 out of 22.  Starting weight: 249 lbs Starting date: 09/18/17 Today's weight : 234 lbs  Today's date: 01/16/2018 Total lbs lost to date: 15 (Patients must lose 7 lbs in the first 6 months to continue with counseling)   ASK: We discussed the diagnosis of  obesity with Angie Velasquez today and Angie Velasquez agreed to give [name] permission to discuss obesity behavioral modification therapy today.  ASSESS: Angie Velasquez has the diagnosis of obesity and her BMI today is 35.59 Angie Velasquez is in the action stage of change   ADVISE: Angie Velasquez was educated on the multiple health risks of obesity as well as the benefit of weight loss to improve her health. She was advised of the need for long term treatment and the importance of lifestyle modifications.  AGREE: Multiple dietary modification options and treatment options were discussed and  Angie Velasquez agreed to the above obesity treatment plan.   Angie Velasquez, am acting as transcriptionist for [employer], PA-C I, Lacy Duverney [facility], have  reviewed this note and agree with its content

## 2018-01-17 ENCOUNTER — Ambulatory Visit: Payer: Medicare HMO | Admitting: Physical Therapy

## 2018-01-17 DIAGNOSIS — M869 Osteomyelitis, unspecified: Secondary | ICD-10-CM

## 2018-01-17 NOTE — Therapy (Signed)
[facility] [address] [place], [place], [address] Phone: [phone]   Fax:  [phone]  Physical Therapy Treatment  Patient Details  Name: Angie Velasquez MRN: [id number] Date of Birth: [birth year] Referring Provider: Teresa Coombs   Encounter Date: 01/17/2018  PT End of Session - 01/17/18 1356    Visit Number  3    Number of Visits  12    Date for PT Re-Evaluation  02/14/18    Authorization Type  [employer] medicare    PT Start Time  1305    PT Stop Time  1346    PT Time Calculation (min)  41 min    Activity Tolerance  Patient tolerated treatment well    Behavior During Therapy  [facility] for tasks assessed/performed       Past Medical History:  Diagnosis Date  . Anxiety    years ago - no longer an issue  . Arthritis    gout  . Cataracts, bilateral   . Complication of anesthesia   . Fracture of pelvis ([place])   . GERD (gastroesophageal reflux disease)   . Hx of chest pain    "anxiety"  . Hyperlipidemia   . INSOMNIA 10/14/2008   in past  . Joint pain   . Kidney problem   . Low blood sugar    eats several smaller meals a day  . Obesity   . Palpitations    Stress related   . Proteinuria    H/O  . Tobacco abuse    quit smoking 08/2013    Past Surgical History:  Procedure Laterality Date  . ABDOMINAL HYSTERECTOMY    . APPENDECTOMY     with hysterectomy  . childbirth     x4  . CHOLECYSTECTOMY  2001  . EYE SURGERY Bilateral 2015   cataract surgery with lens implant  . NEPHRECTOMY  1992   complex cystic mass - nephrectomy right  . ORIF ANKLE FRACTURE Right 07/02/2014   Procedure: OPEN REDUCTION INTERNAL FIXATION (ORIF) RIGHT ANKLE BIMALLOELAR FRACTURE;  Surgeon: Wylene Simmer, MD;  Location: [place];  Service: Orthopedics;  Laterality: Right;  . OTHER SURGICAL HISTORY     hysterectomy, R ovary remains  . SHOULDER SURGERY     bone spurs left    There were no vitals filed for this visit.  Subjective Assessment  - 01/17/18 1355    Subjective  Pt states no increase in pain after last session.     Limitations  Sitting;Standing;Walking;House hold activities    Currently in Pain?  Yes    Pain Score  2     Pain Location  Pelvis    Pain Orientation  Other (Comment)    Pain Descriptors / Indicators  Aching    Pain Type  Chronic pain    Pain Onset  More than a month ago    Pain Frequency  Intermittent                      [facility] Adult PT Treatment/Exercise - 01/17/18 1309      Exercises   Exercises  Knee/Hip      Knee/Hip Exercises: Stretches   Active Hamstring Stretch  3 reps;30 seconds    Other Knee/Hip Stretches  Single knee to chest 30 sec x2 bil      Knee/Hip Exercises: Aerobic   Stationary Bike  L1 x6 min      Knee/Hip Exercises: Seated   Long Arc Quad  2 sets;10 reps;Both  Hamstring Curl  20 reps;Both    Hamstring Limitations  YTB      Knee/Hip Exercises: Supine   Hip Adduction Isometric  20 reps    Hip Adduction Isometric Limitations  Ball squeeze in hooklying     Bridges  20 reps    Other Supine Knee/Hip Exercises  Clam Shells YTB x20 (light)     Other Supine Knee/Hip Exercises  Glute set x20              PT Education - 01/17/18 1356    Education provided  Yes    Education Details  HEP     Methods  Explanation    Comprehension  Verbalized understanding       PT Short Term Goals - 01/03/18 1504      PT SHORT TERM GOAL #1   Title  Pt to be independent with initial HEP     Time  2    Period  Weeks    Status  New    Target Date  01/17/18        PT Long Term Goals - 01/03/18 1505      PT LONG TERM GOAL #1   Title  Pt to report pain 0-2/10 with standing, walking and stairs , to improve ability for community and home navigation     Time  6    Period  Weeks    Status  New    Target Date  02/14/18      PT LONG TERM GOAL #2   Title  Pt to demo increased strenth of Bilateral hips to at least 4/5, to improve stability and pain.     Time  6     Period  Weeks    Status  New    Target Date  02/14/18      PT LONG TERM GOAL #3   Title  Pt to demo ambulation mechanics to be [facility], for up to .5 mi, without Assistive device.     Time  6    Period  Weeks    Status  New    Target Date  02/14/18            Plan - 01/17/18 1431    Clinical Impression Statement  Pt able to progress reps and exercises with ther ex today without increased pain in groin. Plan to continue strengthening progression as tolerated.     Rehab Potential  Good    PT Frequency  2x / week    PT Duration  6 weeks    PT Treatment/Interventions  ADLs/Self Care Home Management;Cryotherapy;Electrical Stimulation;Moist Heat;Therapeutic exercise;Therapeutic activities;Functional mobility training;Stair training;Gait training;Ultrasound;Balance training;Neuromuscular re-education;Patient/family education;Orthotic Fit/Training;Manual techniques;Taping;Passive range of motion    PT Next Visit Plan  HEP, pelvic stabilization, leg length test        Patient will benefit from skilled therapeutic intervention in order to improve the following deficits and impairments:  Abnormal gait, Decreased endurance, Decreased activity tolerance, Decreased strength, Pain, Difficulty walking, Decreased mobility, Decreased balance  Visit Diagnosis: Osteitis pubis [facility])     Problem List Patient Active Problem List   Diagnosis Date Noted  . Intertrigo 01/10/2018  . Pes anserinus bursitis of left knee 01/02/2018  . Osteitis pubis ([place]) 12/19/2017  . Prediabetes 10/22/2017  . Other fatigue 09/18/2017  . Shortness of breath on exertion 09/18/2017  . Vitamin D deficiency 09/18/2017  . Hyperglycemia 09/18/2017  . Class 2 severe obesity with serious comorbidity and body mass index (BMI) of 35.0 to  35.9 in adult [facility]) 06/13/2017  . Atypical chest pain 03/26/2017  . Depression 12/19/2016  . Anxiety state 07/20/2016  . Essential hypertension 01/04/2016  . Eczema 06/29/2015  . Gout  03/12/2015  . S/p nephrectomy 03/12/2015  . Hyperlipidemia 03/12/2015  . Tinnitus 03/12/2015  . COPD (chronic obstructive pulmonary disease) ([place]) 03/12/2015  . History of colonic polyps 03/12/2015  . Bimalleolar fracture 07/02/2014  . Arthritis   . Former smoker   . GERD 01/23/2008  . MICROALBUMINURIA 01/23/2008  . Microscopic hematuria 01/23/2008  . UTI (urinary tract infection) 01/08/2008   Lyndee Hensen, PT, DPT 2:32 PM  01/17/18    [facility] [place], [place], [address] Phone: [phone]   Fax:  [phone]  Name: Angie Velasquez MRN: [id number] Date of Birth: [birth year]

## 2018-01-22 ENCOUNTER — Ambulatory Visit: Payer: Medicare HMO | Admitting: Physical Therapy

## 2018-01-22 ENCOUNTER — Encounter: Payer: Self-pay | Admitting: Physical Therapy

## 2018-01-22 DIAGNOSIS — M869 Osteomyelitis, unspecified: Secondary | ICD-10-CM

## 2018-01-22 NOTE — Therapy (Addendum)
[facility] [address] [place], [place], [address] Phone: [phone]   Fax:  [phone]  Physical Therapy Treatment  Patient Details  Name: Yurani Fettes MRN: [id number] Date of Birth: 01-Aug-1946 Referring Provider: Teresa Coombs   Encounter Date: 01/22/2018  PT End of Session - 01/22/18 1357    Visit Number  4    Number of Visits  12    Date for PT Re-Evaluation  02/14/18    Authorization Type  [employer] medicare    PT Start Time  1304    PT Stop Time  1344    PT Time Calculation (min)  40 min    Activity Tolerance  Patient tolerated treatment well    Behavior During Therapy  [facility] for tasks assessed/performed       Past Medical History:  Diagnosis Date  . Anxiety    years ago - no longer an issue  . Arthritis    gout  . Cataracts, bilateral   . Complication of anesthesia   . Fracture of pelvis ([place])   . GERD (gastroesophageal reflux disease)   . Hx of chest pain    "anxiety"  . Hyperlipidemia   . INSOMNIA 10/14/2008   in past  . Joint pain   . Kidney problem   . Low blood sugar    eats several smaller meals a day  . Obesity   . Palpitations    Stress related   . Proteinuria    H/O  . Tobacco abuse    quit smoking 08/2013    Past Surgical History:  Procedure Laterality Date  . ABDOMINAL HYSTERECTOMY    . APPENDECTOMY     with hysterectomy  . childbirth     x4  . CHOLECYSTECTOMY  2001  . EYE SURGERY Bilateral 2015   cataract surgery with lens implant  . NEPHRECTOMY  1992   complex cystic mass - nephrectomy right  . ORIF ANKLE FRACTURE Right 07/02/2014   Procedure: OPEN REDUCTION INTERNAL FIXATION (ORIF) RIGHT ANKLE BIMALLOELAR FRACTURE;  Surgeon: Wylene Simmer, MD;  Location: [facility];  Service: Orthopedics;  Laterality: Right;  . OTHER SURGICAL HISTORY     hysterectomy, R ovary remains  . SHOULDER SURGERY     bone spurs left    There were no vitals filed for this visit.  Subjective Assessment  - 01/22/18 1356    Subjective  Pt states increased soreness today, she is using RW, which she only uses when in quite a bit of pain. She walked up and down hill several times over the weekend, and had to help push a car out of way in driveway.     Limitations  Sitting;Standing;Walking;House hold activities    Currently in Pain?  Yes    Pain Score  5     Pain Location  Pelvis    Pain Orientation  Other (Comment)    Pain Descriptors / Indicators  Sharp;Aching    Pain Onset  More than a month ago    Pain Frequency  Intermittent                      [facility] Adult PT Treatment/Exercise - 01/22/18 1309      Exercises   Exercises  Knee/Hip      Knee/Hip Exercises: Stretches   Active Hamstring Stretch  3 reps;30 seconds    Other Knee/Hip Stretches  Single knee to chest 30 sec x2 bil    Other Knee/Hip Stretches  pelvic tilts x20      Knee/Hip Exercises: Aerobic   Stationary Bike  --      Knee/Hip Exercises: Seated   Long Arc Quad  2 sets;10 reps;Both    Long Arc Quad Weight  2 lbs.    Hamstring Curl  20 reps;Both    Hamstring Limitations  YTB    Sit to Sand  10 reps      Knee/Hip Exercises: Supine   Hip Adduction Isometric  20 reps    Hip Adduction Isometric Limitations  Ball squeeze in hooklying     Bridges  20 reps    Other Supine Knee/Hip Exercises  Clam Shells YTB x20 (light)     Other Supine Knee/Hip Exercises  Glute set x20       Manual Therapy   Manual Therapy  Passive ROM    Passive ROM  B hips, flex, abd, ir, er             PT Education - 01/22/18 1356    Education provided  Yes    Education Details  HEP, activity modification     Person(s) Educated  Patient    Methods  Explanation    Comprehension  Verbalized understanding       PT Short Term Goals - 01/03/18 1504      PT SHORT TERM GOAL #1   Title  Pt to be independent with initial HEP     Time  2    Period  Weeks    Status  New    Target Date  01/17/18        PT Long Term Goals -  01/03/18 1505      PT LONG TERM GOAL #1   Title  Pt to report pain 0-2/10 with standing, walking and stairs , to improve ability for community and home navigation     Time  6    Period  Weeks    Status  New    Target Date  02/14/18      PT LONG TERM GOAL #2   Title  Pt to demo increased strenth of Bilateral hips to at least 4/5, to improve stability and pain.     Time  6    Period  Weeks    Status  New    Target Date  02/14/18      PT LONG TERM GOAL #3   Title  Pt to demo ambulation mechanics to be [facility], for up to .5 mi, without Assistive device.     Time  6    Period  Weeks    Status  New    Target Date  02/14/18            Plan - 01/22/18 1414    Clinical Impression Statement  Pt with minimal soreness with activities today. Hip strengthening attempted in sidelying, but too painful. She would benefit from hip strengthening, but has been difficult to find position of comfort to perform in. Pt going away to [facility] for 2 weeks at end of this week. pt expresses much stress and concern for situation with her husband and possible alzheimers with him.     Rehab Potential  Good    PT Frequency  2x / week    PT Duration  6 weeks    PT Treatment/Interventions  ADLs/Self Care Home Management;Cryotherapy;Electrical Stimulation;Moist Heat;Therapeutic exercise;Therapeutic activities;Functional mobility training;Stair training;Gait training;Ultrasound;Balance training;Neuromuscular re-education;Patient/family education;Orthotic Fit/Training;Manual techniques;Taping;Passive range of motion    PT Next Visit Plan  HEP, pelvic stabilization, leg length test        Patient will benefit from skilled therapeutic intervention in order to improve the following deficits and impairments:  Abnormal gait, Decreased endurance, Decreased activity tolerance, Decreased strength, Pain, Difficulty walking, Decreased mobility, Decreased balance  Visit Diagnosis: Osteitis pubis [facility])     Problem  List Patient Active Problem List   Diagnosis Date Noted  . Intertrigo 01/10/2018  . Pes anserinus bursitis of left knee 01/02/2018  . Osteitis pubis ([place]) 12/19/2017  . Prediabetes 10/22/2017  . Other fatigue 09/18/2017  . Shortness of breath on exertion 09/18/2017  . Vitamin D deficiency 09/18/2017  . Hyperglycemia 09/18/2017  . Class 2 severe obesity with serious comorbidity and body mass index (BMI) of 35.0 to 35.9 in adult ([place]) 06/13/2017  . Atypical chest pain 03/26/2017  . Depression 12/19/2016  . Anxiety state 07/20/2016  . Essential hypertension 01/04/2016  . Eczema 06/29/2015  . Gout 03/12/2015  . S/p nephrectomy 03/12/2015  . Hyperlipidemia 03/12/2015  . Tinnitus 03/12/2015  . COPD (chronic obstructive pulmonary disease) ([place]) 03/12/2015  . History of colonic polyps 03/12/2015  . Bimalleolar fracture 07/02/2014  . Arthritis   . Former smoker   . GERD 01/23/2008  . MICROALBUMINURIA 01/23/2008  . Microscopic hematuria 01/23/2008  . UTI (urinary tract infection) 01/08/2008   Lyndee Hensen, PT, DPT 2:16 PM  01/22/18    [facility] [place], [place], [address] Phone: [phone]   Fax:  [phone]  Name: Renada Cronin MRN: [id number] Date of Birth: 05-10-1946   PHYSICAL THERAPY DISCHARGE SUMMARY  Visits from Start of Care: 4  Plan: Patient agrees to discharge.  Patient goals were partially met. Patient is being discharged due to not returning since the last visit.  ?????      Pt did not return. Was having minimal pain at last visit.   Lyndee Hensen, PT, DPT 3:32 PM  03/20/18

## 2018-01-23 ENCOUNTER — Telehealth: Payer: Self-pay | Admitting: Family Medicine

## 2018-01-23 ENCOUNTER — Other Ambulatory Visit (INDEPENDENT_AMBULATORY_CARE_PROVIDER_SITE_OTHER): Payer: Self-pay

## 2018-01-23 DIAGNOSIS — F3289 Other specified depressive episodes: Secondary | ICD-10-CM

## 2018-01-23 MED ORDER — BUPROPION HCL ER (SR) 200 MG PO TB12: 200.0000 mg | ORAL_TABLET | Freq: Every day | ORAL | 0 refills | Status: DC

## 2018-01-23 NOTE — Telephone Encounter (Signed)
See note

## 2018-01-23 NOTE — Telephone Encounter (Signed)
This encounter was created in error - please disregard.

## 2018-01-23 NOTE — Telephone Encounter (Signed)
Copied from [place] [phone]. Topic: Quick Communication - Rx Refill/Question >> Jan 23, 2018  9:29 AM Corie Chiquito, [place] wrote: Medication: Tramadol 50mg    Has the patient contacted their pharmacy? No  Patient calling because she needs a refill on the above medication. Stated that she normally gets the medicine from the mail order but due to the fact she will going out of town she doesn't have enough time to get her medication from them    Preferred Pharmacy (with phone number or street name): [facility] [place] [phone]   Agent: Please be advised that RX refills may take up to 3 business days. We ask that you follow-up with your pharmacy.

## 2018-01-23 NOTE — Telephone Encounter (Signed)
Tramadol refill Last OV: Telephone Encounter 06/29/17 Last Refill:06/29/17 #20 tabs Pharmacy:[facility] [address]

## 2018-01-24 ENCOUNTER — Other Ambulatory Visit: Payer: Self-pay

## 2018-01-24 MED ORDER — TRAMADOL HCL 50 MG PO TABS: 50.0000 mg | ORAL_TABLET | Freq: Four times a day (QID) | ORAL | 0 refills | Status: DC | PRN

## 2018-01-24 NOTE — Telephone Encounter (Signed)
Prescription refill sent to pharmacy as requested 

## 2018-02-11 ENCOUNTER — Ambulatory Visit (INDEPENDENT_AMBULATORY_CARE_PROVIDER_SITE_OTHER): Payer: Medicare HMO | Admitting: Sports Medicine

## 2018-02-11 ENCOUNTER — Ambulatory Visit (INDEPENDENT_AMBULATORY_CARE_PROVIDER_SITE_OTHER): Payer: Medicare HMO

## 2018-02-11 ENCOUNTER — Encounter: Payer: Self-pay | Admitting: Sports Medicine

## 2018-02-11 ENCOUNTER — Telehealth: Payer: Self-pay | Admitting: Family Medicine

## 2018-02-11 ENCOUNTER — Ambulatory Visit: Payer: Self-pay | Admitting: *Deleted

## 2018-02-11 VITALS — BP 116/78 | HR 77

## 2018-02-11 DIAGNOSIS — M4722 Other spondylosis with radiculopathy, cervical region: Secondary | ICD-10-CM

## 2018-02-11 DIAGNOSIS — M79602 Pain in left arm: Secondary | ICD-10-CM | POA: Diagnosis not present

## 2018-02-11 DIAGNOSIS — M869 Osteomyelitis, unspecified: Secondary | ICD-10-CM

## 2018-02-11 MED ORDER — GABAPENTIN 300 MG PO CAPS: ORAL_CAPSULE | ORAL | 1 refills | Status: DC

## 2018-02-11 NOTE — Telephone Encounter (Signed)
Called and spoke with patient who denies any chest pain. She states it is the upper part of her left arm. She has a history of bone spurs and states this feels the exact same way. Patient is scheduled for an appointment with Dr. Paulla Fore this afternoon

## 2018-02-11 NOTE — Telephone Encounter (Signed)
Called and spoke with patient who denies any chest pain. She states it is only the upper part of her left arm. Patient states she has a history of bone spurs and the pain feels the same. I scheduled her with Dr. Paulla Fore this afternoon.

## 2018-02-11 NOTE — Telephone Encounter (Signed)
Per [facility]  The patient is experiencing arm pain with radiation. The patient was triage and shows no signs of cardiac arrest. The patient would like a call. Her arm is hurting her a lot. [facility] was unsure if to make her an appointment or inform us first.

## 2018-02-11 NOTE — Telephone Encounter (Signed)
Called in c/o left upper arm pain that radiates down the arm.   Hurts bad enough she can not sleep due to the pain.  The pain began either Tuesday or Wednesday of last week suddenly.  I called the flow coordinator at [place] office where she sees Dr. Yong Channel.   They are going to check with the providers and see if they want her to go to the ED or can be seen in the office.   Dr. Yong Channel is not in the office today.   She only wants to see Dr. Yong Channel and does not want to go to the ED.  I let the pt know someone from the office will be calling her back with a decision.   She agreed to this plan.   Reason for Disposition . Patient sounds very sick or weak to the triager    She denies any s/s of cardiac disorder.   I have called the flow coordinator at [facility] office and they are going to call her back.  They will let her know if she should go to the ED or can come in to see a doctor in the office.  Additional Information . Commented on: [1] Chest pain lasts > 5 minutes AND [2] age > 59    When move the arm around makes the pain worse.   When I raise it up it just hurts.   No shoulder pain.      I had bone spurs many years again in this arm.   I had surgery and it's been fine since then.   This feels like that.  Answer Assessment - Initial Assessment Questions 1. LOCATION: "Where does it hurt?"       My upper left arm.   More on the outside and radiates down.   I can't hardly sleep due to it. 2. RADIATION: "Does the pain go anywhere else?" (e.g., into neck, jaw, arms, back)     No pain.    No injuries. 3. ONSET: "When did the chest pain begin?" (Minutes, hours or days)      Left arm pain began last week.   On Tuesday or Wednesday last week.   4. PATTERN "Does the pain come and go, or has it been constant since it started?"  "Does it get worse with exertion?"      It was sudden.   No other symptoms. 5. DURATION: "How long does it last" (e.g., seconds, minutes, hours)     Constant  pain 6. SEVERITY: "How bad is the pain?"  (e.g., Scale 1-10; mild, moderate, or severe)    - MILD (1-3): doesn't interfere with normal activities     - MODERATE (4-7): interferes with normal activities or awakens from sleep    - SEVERE (8-10): excruciating pain, unable to do any normal activities       The pain goes up and down.  The Tramadol and Tylenol not really help.   The pain is always there. 7. CARDIAC RISK FACTORS: "Do you have any history of heart problems or risk factors for heart disease?" (e.g., prior heart attack, angina; high blood pressure, diabetes, being overweight, high cholesterol, smoking, or strong family history of heart disease)     No cardiac history.   I'm under a lot stress right now. 8. PULMONARY RISK FACTORS: "Do you have any history of lung disease?"  (e.g., blood clots in lung, asthma, emphysema, birth control pills)     Told me  I have COPD but I'm fine.  No symptoms ever with it. 9. CAUSE: "What do you think is causing the chest pain?"     I have no clue. 10. OTHER SYMPTOMS: "Do you have any other symptoms?" (e.g., dizziness, nausea, vomiting, sweating, fever, difficulty breathing, cough)       No other symptoms.    I've had some coughing at night but not on a regular basis.   I keep water by my bed which helps. 11. PREGNANCY: "Is there any chance you are pregnant?" "When was your last menstrual period?"       N/A  Protocols used: CHEST PAIN-A-AH

## 2018-02-11 NOTE — Progress Notes (Signed)
  Angie Velasquez, [place] at [place] - 72 y.o. female MRN [id number]  Date of birth: 1946-07-17  Visit Date: 02/11/2018  PCP: Angie Olp, MD   Referred by: Angie Olp, MD   Scribe for today's visit: Angie Velasquez, CMA     SUBJECTIVE:  Angie Velasquez "[name]" is here for Left upper arm pain  Her left arm pain symptoms INITIALLY: Began about 10 days ago and MOI is unknown.  Described as constant mild pain but can be moderate-severe with use. Pain is described as aching, nonradiating Worsened with lying on L side Improved with pillow under R arm. She did notice some improvement after receiving a massage in [place].  Additional associated symptoms include: hx of bone spurs. She is able to raise her arm overhead with no pain. Pain is mostly posterior. She has noticed increased pain on the L side of her neck.     At this time symptoms show no change compared to onset  She has been taking tylenol and tramadol which helps to take the edge of.    ROS Denies night time disturbances. Denies fevers, chills, or night sweats. Denies unexplained weight loss. Denies personal history of cancer. Denies changes in bowel or bladder habits. Denies recent unreported falls. Denies new or worsening dyspnea or wheezing. Denies headaches or dizziness.  Denies numbness, tingling or weakness  In the extremities.  Denies dizziness or presyncopal episodes Denies lower extremity edema    HISTORY & PERTINENT PRIOR DATA:  Prior History reviewed and updated per electronic medical record.  Significant history, findings, studies and interim changes include:  reports that she quit smoking about 4 years ago. Her smoking use included cigarettes. She has a 35.00 pack-year smoking history. she has never used smokeless tobacco. Recent Labs    09/18/17 1049  HGBA1C 5.7*   No specialty comments  available. Problem  Osteoarthritis of Spine With Radiculopathy, Cervical Region  Osteitis Pubis ([facility])   Fairly severe osteitis pubis based on MRI from July 2018 where she was told that this was a stress fracture in the pubic ramus.  6 months of using a walker and her symptoms are continuing to be severe and actually now occurring on the left side.  Diagnostic and therapeutic injection performed on 12/19/2017 with 100% improvement in her pain immediately following this with the anesthetic phase.  Good, sustained response following injection.  Can repeat if any recurrence of symptoms.  Now walker and doing well.  Now off of a walker and doing well.        Please see additional documentation for Objective, Assessment and Plan sections. Pertinent additional documentation may be included in corresponding procedure notes, imaging studies, problem based documentation and patient instructions. Please see these sections of the encounter for additional information regarding this visit.  CMA/ATC served as Education administrator during this visit. History, Physical, and Plan performed by medical provider. Documentation and orders reviewed and attested to.      Gerda Diss, [facility] Physician

## 2018-02-11 NOTE — Telephone Encounter (Signed)
See note as soon as possible.

## 2018-02-11 NOTE — Procedures (Signed)
X-Rays obtained at [facility] Interpreted by myself Gerda Diss, DO) during office visit.  Results were reviewed with the patient at the time of the visit.   5 VIEW X-RAY of:  Cervical Spine  FINDINGS:   Overall alignment has been preserved with normal cervical lordosis.  She has multilevel degenerative changes most notably at C5-6 and C6-7.  She has diffuse spondylosis of her facet joints.  There is a large osteophytic complex on both the right and left C3-C4 levels.  Vertebral artery calcification appreciated  No other osseous irregularities appreciated.   IMPRESSION:  1. Cervical spondylosis with degenerative disc disease.  As outlined above.

## 2018-02-11 NOTE — Progress Notes (Signed)
  OBJECTIVE:  VS:  HT:    WT:   BMI:     BP:116/78  HR:77bpm  TEMP: ( )  RESP:97 %   PHYSICAL EXAM: Constitutional: WDWN, Non-toxic appearing. Psychiatric: Alert & appropriately interactive.  Not depressed or anxious appearing. Respiratory: No increased work of breathing.  Trachea Midline Eyes: Pupils are equal.  EOM intact without nystagmus.  No scleral icterus  NEUROVASCULAR exam: No clubbing or cyanosis appreciated No significant venous stasis changes Capillary Refill: normal, less than 2 seconds   Patient is walking today with no assistive device and has a fairly normal but wide-based gait.  Cervical range of motion is slightly limited with side bending and rotation to the left.  She has a small amount of pain with arm squeeze test but no pain with brachial plexus squeeze.  Upper extremity strength is symmetric and 5/5.  Upper extremity reflexes are symmetric diffusely with the exception of the left biceps which is 3+.  All others are 2+.  No significant upper extremity swelling.  Upper extremity myotomes are 5/5.  No significant upper extremity dysesthesia.  No midline cervical pain.  ASSESSMENT & PLAN:   1. Osteitis pubis ([place])   2. Left arm pain   3. Osteoarthritis of spine with radiculopathy, cervical region    Orders & Meds:  Orders Placed This Encounter  Procedures  . XR Cervical Spine Complete     Meds ordered this encounter  Medications  . gabapentin (NEURONTIN) 300 MG capsule    Sig: Start with 1 tab po qhs X 1 week, then increase to 1 tab po bid X 1 week then 1 tab po tid prn    Dispense:  90 capsule    Refill:  1   PLAN:    Osteitis pubis ([place]) Doing well, see overview.  Osteoarthritis of spine with radiculopathy, cervical region Symptoms are consistent with cervical spondylosis with radiculitis.  Discussed multiple options.  Suspect some of this is coming from altered mechanics that she had been on a walker for almost a year.  She is just finished with  physical therapy and is significantly improved her core strength but would like for her to continue with home program focusing on posterior chain exercises.  Links to [employer] provided today per Patient Instructions.  These exercises were developed by Minerva Ends, DC with a strong emphasis on core neuromuscular reducation and postural realignment through body-weight exercises.    Follow-up: Return in about 4 weeks (around 03/11/2018) for repeat clinical exam.

## 2018-02-11 NOTE — Patient Instructions (Signed)

## 2018-02-12 DIAGNOSIS — M4722 Other spondylosis with radiculopathy, cervical region: Secondary | ICD-10-CM | POA: Insufficient documentation

## 2018-02-12 NOTE — Assessment & Plan Note (Signed)
Doing well, see overview.

## 2018-02-12 NOTE — Assessment & Plan Note (Signed)
Symptoms are consistent with cervical spondylosis with radiculitis.  Discussed multiple options.  Suspect some of this is coming from altered mechanics that she had been on a walker for almost a year.  She is just finished with physical therapy and is significantly improved her core strength but would like for her to continue with home program focusing on posterior chain exercises.  Links to [employer] provided today per Patient Instructions.  These exercises were developed by Minerva Ends, DC with a strong emphasis on core neuromuscular reducation and postural realignment through body-weight exercises.

## 2018-02-13 ENCOUNTER — Ambulatory Visit (INDEPENDENT_AMBULATORY_CARE_PROVIDER_SITE_OTHER): Payer: Medicare HMO | Admitting: Physician Assistant

## 2018-02-13 VITALS — BP 98/63 | HR 76 | Temp 97.8°F | Ht 68.0 in | Wt 233.0 lb

## 2018-02-13 DIAGNOSIS — F3289 Other specified depressive episodes: Secondary | ICD-10-CM

## 2018-02-13 DIAGNOSIS — Z6835 Body mass index (BMI) 35.0-35.9, adult: Secondary | ICD-10-CM

## 2018-02-13 DIAGNOSIS — K5909 Other constipation: Secondary | ICD-10-CM | POA: Diagnosis not present

## 2018-02-13 MED ORDER — BUPROPION HCL ER (SR) 200 MG PO TB12: 200.0000 mg | ORAL_TABLET | Freq: Every day | ORAL | 0 refills | Status: DC

## 2018-02-13 MED ORDER — POLYETHYLENE GLYCOL 3350 17 GM/SCOOP PO POWD: 17.0000 g | Freq: Every day | ORAL | 0 refills | Status: DC

## 2018-02-13 NOTE — Progress Notes (Signed)
Office: [phone]  /  Fax: [phone]   HPI:   Chief Complaint: OBESITY Angie Velasquez is here to discuss her progress with her obesity treatment plan. She is on the portion control better and make smarter food choices plan  and is following her eating plan approximately 50 % of the time. She states she is exercising on a stationary bike for 30 to 45 minutes. Angie Velasquez continues to do well with weight loss. She makes smarter food choices and controls her portions. She will be going out of town and requests a longer follow up time. Her weight is 233 lb (105.7 kg) today and has had a weight loss of 1 pound over a period of 4 weeks since her last visit. She has lost 16 lbs since starting treatment with [name].  Constipation [name] notes constipation for the last few weeks, worse since attempting weight loss. She states BM are less frequent and are not hard and painful. She denies hematochezia or melena.   Depression with emotional eating behaviors Angie Velasquez is struggling with emotional eating and using food for comfort to the extent that it is negatively impacting her health. She often snacks when she is not hungry. Angie Velasquez sometimes feels she is out of control and then feels guilty that she made poor food choices. She has been working on behavior modification techniques to help reduce her emotional eating and has been somewhat successful. Her mood is stable and she shows no sign of suicidal or homicidal ideations.  Depression screen [facility] 2/9 01/10/2018 09/18/2017 09/05/2017 03/26/2017 03/26/2017  Decreased Interest 1 1 2  0 0  Down, Depressed, Hopeless 2 1 2 1  0  PHQ - 2 Score 3 2 4 1  0  Altered sleeping 0 0 0 - -  Tired, decreased energy 0 1 1 - -  Change in appetite 0 1 0 - -  Feeling bad or failure about yourself  0 1 0 - -  Trouble concentrating 0 1 0 - -  Moving slowly or fidgety/restless 0 0 0 - -  Suicidal thoughts 0 1 0 - -  PHQ-9 Score 3 7 5  - -  Difficult doing work/chores Somewhat difficult  Not difficult at all Somewhat difficult - -    ALLERGIES: Allergies  Allergen Reactions  . Epinephrine Palpitations  . Codeine Nausea Only  . Penicillins Nausea Only    MEDICATIONS: Current Outpatient Medications on File Prior to Visit  Medication Sig Dispense Refill  . acetaminophen (TYLENOL) 500 MG tablet Take 500 mg by mouth every 6 (six) hours as needed.    [name] allopurinol (ZYLOPRIM) 300 MG tablet TAKE 1 TABLET (300 MG TOTAL) ONCE A DAY 90 tablet 3  . aspirin 81 MG tablet Take 81 mg by mouth daily.    [name] CALCIUM-MAGNESIUM-ZINC PO Take by mouth.    . clonazePAM (KLONOPIN) 0.5 MG tablet Take 1 tablet (0.5 mg total) by mouth at bedtime. 90 tablet 1  . diazepam (VALIUM) 10 MG tablet Take 10 mg by mouth every 6 (six) hours as needed (as needed for muscle spasms).    . diclofenac sodium (VOLTAREN) 1 % GEL Apply topically to affected area qid 100 g 2  . gabapentin (NEURONTIN) 300 MG capsule Start with 1 tab po qhs X 1 week, then increase to 1 tab po bid X 1 week then 1 tab po tid prn 90 capsule 1  . lisinopril (PRINIVIL,ZESTRIL) 2.5 MG tablet Take 1 tablet (2.5 mg total) by mouth daily. 90 tablet 3  . nystatin  cream (MYCOSTATIN) Apply 1 application topically 2 (two) times daily. To rash in groin for up to 2 weeks maximum 90 g 2  . OVER THE COUNTER MEDICATION Cherry extract bid    . OVER THE COUNTER MEDICATION B-12 one daily    . OVER THE COUNTER MEDICATION Vitamin D    . sertraline (ZOLOFT) 50 MG tablet Take 1 tablet (50 mg total) by mouth daily. 90 tablet 3  . simvastatin (ZOCOR) 20 MG tablet TAKE 1 TABLET AT BEDTIME 90 tablet 3  . Sodium Bicarbonate (NICE PURE BAKING SODA) POWD by Does not apply route.    . traMADol (ULTRAM) 50 MG tablet Take 1 tablet (50 mg total) by mouth every 6 (six) hours as needed for moderate pain or severe pain. 20 tablet 0  . triamcinolone cream (KENALOG) 0.1 % Apply 1 application topically 2 (two) times daily. For 7-10 days for recurring eczema issues 80 g 1    No current facility-administered medications on file prior to visit.     PAST MEDICAL HISTORY: Past Medical History:  Diagnosis Date  . Anxiety    years ago - no longer an issue  . Arthritis    gout  . Cataracts, bilateral   . Complication of anesthesia   . Fracture of pelvis ([place])   . GERD (gastroesophageal reflux disease)   . Hx of chest pain    "anxiety"  . Hyperlipidemia   . INSOMNIA 10/14/2008   in past  . Joint pain   . Kidney problem   . Low blood sugar    eats several smaller meals a day  . Obesity   . Palpitations    Stress related   . Proteinuria    H/O  . Tobacco abuse    quit smoking 08/2013    PAST SURGICAL HISTORY: Past Surgical History:  Procedure Laterality Date  . ABDOMINAL HYSTERECTOMY    . APPENDECTOMY     with hysterectomy  . childbirth     x4  . CHOLECYSTECTOMY  2001  . EYE SURGERY Bilateral 2015   cataract surgery with lens implant  . NEPHRECTOMY  1992   complex cystic mass - nephrectomy right  . ORIF ANKLE FRACTURE Right 07/02/2014   Procedure: OPEN REDUCTION INTERNAL FIXATION (ORIF) RIGHT ANKLE BIMALLOELAR FRACTURE;  Surgeon: Wylene Simmer, MD;  Location: [facility];  Service: Orthopedics;  Laterality: Right;  . OTHER SURGICAL HISTORY     hysterectomy, R ovary remains  . SHOULDER SURGERY     bone spurs left    SOCIAL HISTORY: Social History   Tobacco Use  . Smoking status: Former Smoker    Packs/day: 1.00    Years: 35.00    Pack years: 35.00    Types: Cigarettes    Last attempt to quit: 09/01/2013    Years since quitting: 4.4  . Smokeless tobacco: Never Used  . Tobacco comment: Former smoker with no desire to smoke again. Smoking years edited at patients request to include periods when she did not smoke.  Substance Use Topics  . Alcohol use: Yes    Alcohol/week: 0.6 oz    Types: 1 Shots of liquor per week  . Drug use: No    FAMILY HISTORY: Family History  Problem Relation Age of Onset  . Stroke Father   . Alzheimer's  disease Father        early, states also parkinsons. died 73  . Hypertension Father   . Obesity Father   . Ovarian cancer Mother  Dr. Jana Hakim   . Arthritis Mother   . Obesity Mother   . Colon cancer Paternal Grandmother   . Liver disease Daughter        On hospice age 46    ROS: Review of Systems  Constitutional: Positive for weight loss.  Gastrointestinal: Positive for constipation. Negative for melena.       Negative for hematochezia  Psychiatric/Behavioral: Positive for depression. Negative for suicidal ideas.    PHYSICAL EXAM: Blood pressure 98/63, pulse 76, temperature 97.8 F (36.6 C), temperature source Oral, height 5\' 8"  (1.727 m), weight 233 lb (105.7 kg), SpO2 97 %. Body mass index is 35.43 kg/m. Physical Exam  Constitutional: She is oriented to person, place, and time. She appears well-developed and well-nourished.  Cardiovascular: Normal rate.  Pulmonary/Chest: Effort normal.  Musculoskeletal: Normal range of motion.  Neurological: She is oriented to person, place, and time.  Skin: Skin is warm and dry.  Psychiatric: She has a normal mood and affect. Her behavior is normal.  Vitals reviewed.   RECENT LABS AND TESTS: BMET    Component Value Date/Time   NA 138 09/18/2017 1049   K 4.2 09/18/2017 1049   CL 98 09/18/2017 1049   CO2 23 09/18/2017 1049   GLUCOSE 96 09/18/2017 1049   GLUCOSE 109 (H) 01/18/2017 0902   BUN 16 09/18/2017 1049   CREATININE 1.19 (H) 09/18/2017 1049   CALCIUM 9.5 09/18/2017 1049   GFRNONAA 46 (L) 09/18/2017 1049   GFRAA 53 (L) 09/18/2017 1049   Lab Results  Component Value Date   HGBA1C 5.7 (H) 09/18/2017   HGBA1C (H) 04/15/2010    5.9 (NOTE)                                                                       According to the ADA Clinical Practice Recommendations for 2011, when HbA1c is used as a screening test:   >=6.5%   Diagnostic of Diabetes Mellitus           (if abnormal result  is confirmed)  5.7-6.4%    Increased risk of developing Diabetes Mellitus  References:Diagnosis and Classification of Diabetes Mellitus,Diabetes EXHB,7169,67(ELFYB 1):S62-S69 and Standards of Medical Care in         Diabetes - 2011,Diabetes Care,2011,34  (Suppl 1):S11-S61.   HGBA1C 5.8 12/31/2007   Lab Results  Component Value Date   INSULIN 25.3 (H) 09/18/2017   CBC    Component Value Date/Time   WBC 6.4 09/18/2017 1049   WBC 6.6 01/18/2017 0902   RBC 4.57 09/18/2017 1049   RBC 4.39 01/18/2017 0902   HGB 13.5 09/18/2017 1049   HCT 40.4 09/18/2017 1049   PLT 240.0 01/18/2017 0902   MCV 88 09/18/2017 1049   MCH 29.5 09/18/2017 1049   MCH 29.4 07/02/2014 1143   MCHC 33.4 09/18/2017 1049   MCHC 33.2 01/18/2017 0902   RDW 15.2 09/18/2017 1049   LYMPHSABS 2.6 09/18/2017 1049   MONOABS 0.9 04/15/2010 2135   EOSABS 0.2 09/18/2017 1049   BASOSABS 0.0 09/18/2017 1049   Iron/TIBC/Ferritin/ %Sat No results found for: IRON, TIBC, FERRITIN, IRONPCTSAT Lipid Panel     Component Value Date/Time   CHOL 161 09/18/2017 1049   TRIG 233 (H) 09/18/2017 1049  HDL 58 09/18/2017 1049   CHOLHDL 2 01/18/2017 0902   VLDL 28.2 01/18/2017 0902   LDLCALC 56 09/18/2017 1049   LDLDIRECT 77.0 01/18/2017 0902   Hepatic Function Panel     Component Value Date/Time   PROT 6.5 09/18/2017 1049   ALBUMIN 4.7 09/18/2017 1049   AST 35 09/18/2017 1049   ALT 42 (H) 09/18/2017 1049   ALKPHOS 92 09/18/2017 1049   BILITOT 0.3 09/18/2017 1049   BILIDIR <0.1 04/15/2010 2135   IBILI NOT CALCULATED 04/15/2010 2135      Component Value Date/Time   TSH 3.430 09/18/2017 1049   TSH 2.09 05/25/2015 0820   TSH 1.89 12/31/2007 0831    ASSESSMENT AND PLAN: Other constipation - Plan: polyethylene glycol powder (GLYCOLAX/MIRALAX) powder  Other depression - with emotional eating - Plan: buPROPion (WELLBUTRIN SR) 200 MG 12 hr tablet  Class 2 severe obesity with serious comorbidity and body mass index (BMI) of 35.0 to 35.9 in adult,  unspecified obesity type ([place])  Other depression - with emotional eating  - Plan: buPROPion (WELLBUTRIN SR) 200 MG 12 hr tablet  PLAN:  Constipation Angie Velasquez was informed decrease bowel movement frequency is normal while losing weight, but stools should not be hard or painful. She was advised to increase her H20 intake and work on increasing her fiber intake. High fiber foods were discussed today. Angie Velasquez agrees to start Miralax OTC 1 capful daily and follow up as directed.  Depression with Emotional Eating Behaviors We discussed behavior modification techniques today to help Ara deal with her emotional eating and depression. She has agreed to continue to take Wellbutrin SR 200 mg qd #30 with no refills and follow up as directed.  Obesity Angie Velasquez is currently in the action stage of change. As such, her goal is to continue with weight loss efforts She has agreed to portion control better and make smarter food choices, such as increase vegetables and decrease simple carbohydrates  Angie Velasquez has been instructed to work up to a goal of 150 minutes of combined cardio and strengthening exercise per week for weight loss and overall health benefits. We discussed the following Behavioral Modification Strategies today: increasing lean protein intake and travel eating strategies   Angie Velasquez has agreed to follow up with our clinic in 4 weeks. She was informed of the importance of frequent follow up visits to maximize her success with intensive lifestyle modifications for her multiple health conditions.    OBESITY BEHAVIORAL INTERVENTION VISIT  Today's visit was # 8 out of 22.  Starting weight: 249 lbs Starting date: 09/18/17 Today's weight : 233 lbs Today's date: 02/13/2018 Total lbs lost to date: 16 (Patients must lose 7 lbs in the first 6 months to continue with counseling)   ASK: We discussed the diagnosis of obesity with Angie Velasquez today and Angie Velasquez agreed to give [name] permission  to discuss obesity behavioral modification therapy today.  ASSESS: Sydnei has the diagnosis of obesity and her BMI today is 35.44 Ranee is in the action stage of change   ADVISE: Angie Velasquez was educated on the multiple health risks of obesity as well as the benefit of weight loss to improve her health. She was advised of the need for long term treatment and the importance of lifestyle modifications.  AGREE: Multiple dietary modification options and treatment options were discussed and  Angie Velasquez agreed to the above obesity treatment plan.   Corey Skains, am acting as transcriptionist for [employer], PA-C I, [facility], have  reviewed this note and agree with its content

## 2018-03-13 ENCOUNTER — Ambulatory Visit (INDEPENDENT_AMBULATORY_CARE_PROVIDER_SITE_OTHER): Payer: Medicare HMO | Admitting: Sports Medicine

## 2018-03-13 ENCOUNTER — Encounter: Payer: Self-pay | Admitting: Sports Medicine

## 2018-03-13 VITALS — BP 198/70 | HR 78 | Ht 68.0 in | Wt 234.0 lb

## 2018-03-13 DIAGNOSIS — M79602 Pain in left arm: Secondary | ICD-10-CM

## 2018-03-13 DIAGNOSIS — M4722 Other spondylosis with radiculopathy, cervical region: Secondary | ICD-10-CM | POA: Diagnosis not present

## 2018-03-13 DIAGNOSIS — M869 Osteomyelitis, unspecified: Secondary | ICD-10-CM | POA: Diagnosis not present

## 2018-03-13 NOTE — Progress Notes (Signed)
Angie Velasquez. Velasquez, Angie at [facility] - 72 y.o. female MRN [id number]  Date of birth: December 03, 1946  Visit Date: 03/13/2018  PCP: Marin Olp, MD   Referred by: Marin Olp, MD  Scribe for today's visit: Josepha Pigg, CMA     SUBJECTIVE:  Angie Shy "[name]" is here for Follow-up (osteitis pubis)  02/11/2018: Her left arm pain symptoms INITIALLY: Began about 10 days ago and MOI is unknown.  Described as constant mild pain but can be moderate-severe with use. Pain is described as aching, nonradiating Worsened with lying on L side Improved with pillow under R arm. She did notice some improvement after receiving a massage in [place].  Additional associated symptoms include: hx of bone spurs. She is able to raise her arm overhead with no pain. Pain is mostly posterior. She has noticed increased pain on the L side of her neck.    At this time symptoms show no change compared to onset  She has been taking tylenol and tramadol which helps to take the edge off.   03/13/2018: Compared to the last office visit, her previously described symptoms are worsening. She traveled recently and had to walk a lot around the airport carrying her purse and carry on. She feels that this has contributed to her increased pelvic pain.  Current symptoms are severe & are nonradiating She has been trying to rest. She stayed off of her feet yesterday and that help. She has tried icing the area with some relief. She has bene taking Gabapentin 300 mg BID. She has also been taking tramadol and tylenol prn, up to twice daily, this just takes the edge off.   She will be moving out soon d/t recent split up with her husband.   Compared to the last office visit, her previously described symptoms of left-sided neck and arm pain show no change. Current symptoms are mild & are radiating to L arm. The more she uses the L arm  the worse the pain gets. Pain was worse after 700 mile road trip.  She has been icing her neck/arm, taking Gabapentin, tramadol, and tylenol.   ROS Denies night time disturbances. Denies fevers, chills, or night sweats. Denies unexplained weight loss. Denies personal history of cancer. Denies changes in bowel or bladder habits. Denies recent unreported falls. Denies new or worsening dyspnea or wheezing. Denies headaches or dizziness.  Denies numbness, tingling or weakness  In the extremities.  Denies dizziness or presyncopal episodes Denies lower extremity edema    HISTORY & PERTINENT PRIOR DATA:  Prior History reviewed and updated per electronic medical record.  Significant/pertinent history, findings, studies include:  reports that she quit smoking about 4 years ago. Her smoking use included cigarettes. She has a 35.00 pack-year smoking history. She has never used smokeless tobacco. Recent Labs    09/18/17 1049  HGBA1C 5.7*   No specialty comments available. No problems updated.  OBJECTIVE:  VS:  HT:5\' 8"  (172.7 cm)   WT:234 lb (106.1 kg)(pt reported)  BMI:35.59    BP:(Abnormal) 198/70  HR:78bpm  TEMP: ( )  RESP:96 %   PHYSICAL EXAM: Constitutional: WDWN, Non-toxic appearing. Psychiatric: Alert & appropriately interactive.  Not depressed or anxious appearing. Respiratory: No increased work of breathing.  Trachea Midline Eyes: Pupils are equal.  EOM intact without nystagmus.  No scleral icterus  Vascular Exam: warm to touch no edema  lower extremity neuro exam: unremarkable  normal strength normal sensation normal reflexes  MSK Exam: Walks with slightly antalgic gait.  Full overhead range of motion of the arms.     ASSESSMENT & PLAN:   1. Osteitis pubis ([place])   2. Left arm pain   3. Osteoarthritis of spine with radiculopathy, cervical region     PLAN: Overall her osteitis pubis has been well controlled status post injection earlier this year.  We will  plan to have him follow-up as needed for repeat injections.  Her upper extremity pain has been well controlled with tramadol and Tylenol.  She does request a refill today and will discuss this with Dr. Yong Channel as this is been the primary prescriber.  I am happy to refill intermittent use.  Next  Unfortunately she is going through divorce at this time he is moving into her own apartment.  We did briefly discuss safety and importance of improving and focusing on herself.  Follow-up: Return for as needed for ongoing issues.      Please see additional documentation for Objective, Assessment and Plan sections. Pertinent additional documentation may be included in corresponding procedure notes, imaging studies, problem based documentation and patient instructions. Please see these sections of the encounter for additional information regarding this visit.  CMA/ATC served as Education administrator during this visit. History, Physical, and Plan performed by medical provider. Documentation and orders reviewed and attested to.      Gerda Diss, [facility] Physician

## 2018-03-13 NOTE — Progress Notes (Signed)
Happy to help with this. THanks for sending this Dr. Paulla Fore. Team- can you figure out which pharmacy she wants for the tramadol. I was considering #60 with 1 refill.

## 2018-03-14 ENCOUNTER — Other Ambulatory Visit: Payer: Self-pay

## 2018-03-14 ENCOUNTER — Ambulatory Visit (INDEPENDENT_AMBULATORY_CARE_PROVIDER_SITE_OTHER): Payer: Medicare HMO | Admitting: Physician Assistant

## 2018-03-14 VITALS — BP 110/65 | HR 77 | Ht 68.0 in | Wt 235.0 lb

## 2018-03-14 DIAGNOSIS — F3289 Other specified depressive episodes: Secondary | ICD-10-CM

## 2018-03-14 DIAGNOSIS — E7849 Other hyperlipidemia: Secondary | ICD-10-CM | POA: Diagnosis not present

## 2018-03-14 DIAGNOSIS — Z6835 Body mass index (BMI) 35.0-35.9, adult: Secondary | ICD-10-CM | POA: Diagnosis not present

## 2018-03-14 MED ORDER — TRAMADOL HCL 50 MG PO TABS: 50.0000 mg | ORAL_TABLET | Freq: Four times a day (QID) | ORAL | 1 refills | Status: DC | PRN

## 2018-03-14 MED ORDER — BUPROPION HCL ER (SR) 200 MG PO TB12: 200.0000 mg | ORAL_TABLET | Freq: Every day | ORAL | 0 refills | Status: DC

## 2018-03-15 ENCOUNTER — Ambulatory Visit: Payer: Medicare HMO | Admitting: Sports Medicine

## 2018-03-15 ENCOUNTER — Encounter: Payer: Self-pay | Admitting: Sports Medicine

## 2018-03-15 ENCOUNTER — Ambulatory Visit: Payer: Self-pay

## 2018-03-15 VITALS — BP 130/80 | HR 83 | Ht 68.0 in | Wt 234.0 lb

## 2018-03-15 DIAGNOSIS — M8530 Osteitis condensans, unspecified site: Secondary | ICD-10-CM | POA: Diagnosis not present

## 2018-03-15 NOTE — Progress Notes (Signed)
[name]. Rigby, [place] at [facility] - 72 y.o. female MRN [id number]  Date of birth: 12-11-1945  Visit Date: 03/15/2018  PCP: Marin Olp, MD   Referred by: Marin Olp, MD  Scribe for today's visit: Wendy Poet, LAT, ATC     SUBJECTIVE:  Angie Shy "[name]" is here for No chief complaint on file. .   02/11/2018: Her left arm pain symptoms INITIALLY: Began about 10 days ago and MOI is unknown.  Described as constant mild pain but can be moderate-severe with use. Pain is described as aching, nonradiating Worsened with lying on L side Improved with pillow under R arm. She did notice some improvement after receiving a massage in [place].  Additional associated symptoms include: hx of bone spurs. She is able to raise her arm overhead with no pain. Pain is mostly posterior. She has noticed increased pain on the L side of her neck.    At this time symptoms show no change compared to onset  She has been taking tylenol and tramadol which helps to take the edge off.   03/13/2018: Compared to the last office visit, her previously described symptoms are worsening. She traveled recently and had to walk a lot around the airport carrying her purse and carry on. She feels that this has contributed to her increased pelvic pain.  Current symptoms are severe & are nonradiating She has been trying to rest. She stayed off of her feet yesterday and that help. She has tried icing the area with some relief. She has bene taking Gabapentin 300 mg BID. She has also been taking tramadol and tylenol prn, up to twice daily, this just takes the edge off.  She will be moving out soon d/t recent split up with her husband.  Compared to the last office visit, her previously described symptoms of left-sided neck and arm pain show no change. Current symptoms are mild & are radiating to L arm. The more she uses the L arm  the worse the pain gets. Pain was worse after 700 mile road trip.  She has been icing her neck/arm, taking Gabapentin, tramadol, and tylenol.   03/15/2018: Compared to the last office visit, her previously described pelvic pain symptoms are worsening.  She states that she is in the process of moving and just had a very bad day yesterday.  She notes having taken 2 hydrocodone yesterday in order to try to sleep last night. She is currently walking w/ a walker. Current symptoms are severe (20/10) & are radiating to R groin from the symphysis pubis. She has been taking Gabapentin 300mg  bid, Tramadol and Tylenol.  She also took 2 hydrocodone yesterday.  She is currently walking w/ a walker.  ROS Reports night time disturbances. Denies fevers, chills, or night sweats. Denies unexplained weight loss. Denies personal history of cancer. Denies changes in bowel or bladder habits. Denies recent unreported falls. Denies new or worsening dyspnea or wheezing. Denies headaches or dizziness.  Denies numbness, tingling or weakness  In the extremities.  Denies dizziness or presyncopal episodes Denies lower extremity edema    HISTORY & PERTINENT PRIOR DATA:  Prior History reviewed and updated per electronic medical record.  Significant/pertinent history, findings, studies include:  reports that she quit smoking about 4 years ago. Her smoking use included cigarettes. She has a 35.00 pack-year smoking history. She has never used smokeless tobacco. Recent Labs  09/18/17 1049  HGBA1C 5.7*   No specialty comments available. No problems updated.  OBJECTIVE:  VS:  HT:5\' 8"  (172.7 cm)   WT:234 lb (106.1 kg)  BMI:35.59    BP:130/80  HR:83bpm  TEMP: ( )  RESP:96 %   PHYSICAL EXAM: Constitutional: WDWN, Non-toxic appearing. Psychiatric: Alert & appropriately interactive.  Not depressed or anxious appearing. Respiratory: No increased work of breathing.  Trachea Midline Eyes: Pupils are equal.  EOM  intact without nystagmus.  No scleral icterus  Vascular Exam: warm to touch no edema  lower extremity neuro exam: unremarkable  MSK Exam: She has marked difficulty with ambulation again using a walker.  She has a wide-based antalgic gait.  Marked pain over the pubic symphysis.   ASSESSMENT & PLAN:   1. Osteitis pubis ([facility])     PLAN: Repeat pubic symphysis ultrasound-guided injection performed today.  We will plan to follow-up with her in 8 weeks and can consider further/repeat injection versus referral to physical therapy if persistent symptoms.  Ultimately the only further intervention may be can to consider a fusion however this is obviously not a preferred choice. Follow-up: Return in about 8 weeks (around 05/10/2018).      Please see additional documentation for Objective, Assessment and Plan sections. Pertinent additional documentation may be included in corresponding procedure notes, imaging studies, problem based documentation and patient instructions. Please see these sections of the encounter for additional information regarding this visit.  CMA/ATC served as Education administrator during this visit. History, Physical, and Plan performed by medical provider. Documentation and orders reviewed and attested to.      Gerda Diss, [facility] Physician

## 2018-03-15 NOTE — Telephone Encounter (Signed)
Pt. Reports continued pelvic pain. Reports pain medication "helps some." Dr. Paulla Fore told me to come back if I was hurting. Appointment made for today. Reason for Disposition . [1] MODERATE (e.g., interferes with normal activities) pelvic pain AND [2] pain comes and goes (cramps) AND [3] present > 24 hours  Answer Assessment - Initial Assessment Questions 1. LOCATION: "Where does it hurt?"      Pelvic pain 2. RADIATION: "Does the pain shoot anywhere else?" (e.g., lower back, groin, thighs)     No 3. ONSET: "When did the pain begin?" (e.g., minutes, hours or days ago)      Awhile 4. SUDDEN: "Gradual or sudden onset?"     Gradual 5. PATTERN "Does the pain come and go, or is it constant?"    - If constant: "Is it getting better, staying the same, or worsening?"      (Note: Constant means the pain never goes away completely; most serious pain is constant and gets worse over time)     - If intermittent: "How long does it last?" "Do you have pain now?"     (Note: Intermittent means the pain goes away completely between bouts)     Constant 6. SEVERITY: "How bad is the pain?"  (e.g., Scale 1-10; mild, moderate, or severe)   - MILD (1-3): doesn't interfere with normal activities, area soft and not tender to touch    - MODERATE (4-7): interferes with normal activities or awakens from sleep, tender to touch    - SEVERE (8-10): excruciating pain, doubled over, unable to do any normal activities      10 7. RECURRENT SYMPTOM: "Have you ever had this type of pelvic pain before?" If so, ask: "When was the last time?" and "What happened that time?"      Yes 8. CAUSE: "What do you think is causing the pelvic pain?"     Arthritis 9. RELIEVING/AGGRAVATING FACTORS: "What makes it better or worse?" (e.g., activity/rest, sexual intercourse, voiding, passing stool)     Nothing helps - pain meds help a little 10. OTHER SYMPTOMS: "Has there been any other symptoms?" (e.g., fever, vaginal bleeding, vaginal  discharge, diarrhea, constipation, or voiding problems?"       No 11. PREGNANCY: "Is there any chance you are pregnant?" "When was your last menstrual period?"       NO  Protocols used: PELVIC PAIN - [facility]

## 2018-03-15 NOTE — Patient Instructions (Signed)

## 2018-03-18 NOTE — Progress Notes (Signed)
Office: [phone]  /  Fax: [phone]   HPI:   Chief Complaint: OBESITY Angie Velasquez is here to discuss her progress with her obesity treatment plan. She is on the Category 2 plan and is following her eating plan approximately 50 % of the time. She states she is exercising 0 minutes 0 times per week. Angie Velasquez was out of town and found it hard to manage her eating. She also is in the process of moving out of her house. Her weight is 235 lb (106.6 kg) today and has had a weight gain of 2 pounds over a period of 4 weeks since her last visit. She has lost 14 lbs since starting treatment with [name].  Hyperlipidemia Angie Velasquez has hyperlipidemia and is on simvastatin. She has been trying to improve her cholesterol levels with intensive lifestyle modification including a low saturated fat diet, exercise and weight loss. She denies any chest pain or claudication.  Depression with emotional eating behaviors Angie Velasquez is struggling with emotional eating and using food for comfort to the extent that it is negatively impacting her health. She often snacks when she is not hungry. Angie Velasquez sometimes feels she is out of control and then feels guilty that she made poor food choices. She has been working on behavior modification techniques to help reduce her emotional eating and has been somewhat successful. Her mood is stable and she shows no sign of suicidal or homicidal ideations.  Depression screen [facility] 2/9 01/10/2018 09/18/2017 09/05/2017 03/26/2017 03/26/2017  Decreased Interest 1 1 2  0 0  Down, Depressed, Hopeless 2 1 2 1  0  PHQ - 2 Score 3 2 4 1  0  Altered sleeping 0 0 0 - -  Tired, decreased energy 0 1 1 - -  Change in appetite 0 1 0 - -  Feeling bad or failure about yourself  0 1 0 - -  Trouble concentrating 0 1 0 - -  Moving slowly or fidgety/restless 0 0 0 - -  Suicidal thoughts 0 1 0 - -  PHQ-9 Score 3 7 5  - -  Difficult doing work/chores Somewhat difficult Not difficult at all Somewhat difficult - -     ALLERGIES: Allergies  Allergen Reactions  . Epinephrine Palpitations  . Codeine Nausea Only  . Penicillins Nausea Only    MEDICATIONS: Current Outpatient Medications on File Prior to Visit  Medication Sig Dispense Refill  . acetaminophen (TYLENOL) 500 MG tablet Take 500 mg by mouth every 6 (six) hours as needed.    [name] allopurinol (ZYLOPRIM) 300 MG tablet TAKE 1 TABLET (300 MG TOTAL) ONCE A DAY 90 tablet 3  . aspirin 81 MG tablet Take 81 mg by mouth daily.    [name] CALCIUM-MAGNESIUM-ZINC PO Take by mouth.    . clonazePAM (KLONOPIN) 0.5 MG tablet Take 1 tablet (0.5 mg total) by mouth at bedtime. 90 tablet 1  . diazepam (VALIUM) 10 MG tablet Take 10 mg by mouth every 6 (six) hours as needed (as needed for muscle spasms).    . diclofenac sodium (VOLTAREN) 1 % GEL Apply topically to affected area qid 100 g 2  . gabapentin (NEURONTIN) 300 MG capsule Start with 1 tab po qhs X 1 week, then increase to 1 tab po bid X 1 week then 1 tab po tid prn 90 capsule 1  . lisinopril (PRINIVIL,ZESTRIL) 2.5 MG tablet Take 1 tablet (2.5 mg total) by mouth daily. 90 tablet 3  . nystatin cream (MYCOSTATIN) Apply 1 application topically 2 (two) times daily.  To rash in groin for up to 2 weeks maximum 90 g 2  . OVER THE COUNTER MEDICATION Cherry extract bid    . OVER THE COUNTER MEDICATION B-12 one daily    . OVER THE COUNTER MEDICATION Vitamin D    . polyethylene glycol powder (GLYCOLAX/MIRALAX) powder Take 17 g by mouth daily. 3350 g 0  . sertraline (ZOLOFT) 50 MG tablet Take 1 tablet (50 mg total) by mouth daily. 90 tablet 3  . simvastatin (ZOCOR) 20 MG tablet TAKE 1 TABLET AT BEDTIME 90 tablet 3  . Sodium Bicarbonate (NICE PURE BAKING SODA) POWD by Does not apply route.    . triamcinolone cream (KENALOG) 0.1 % Apply 1 application topically 2 (two) times daily. For 7-10 days for recurring eczema issues 80 g 1   No current facility-administered medications on file prior to visit.     PAST MEDICAL  HISTORY: Past Medical History:  Diagnosis Date  . Anxiety    years ago - no longer an issue  . Arthritis    gout  . Cataracts, bilateral   . Complication of anesthesia   . Fracture of pelvis ([place])   . GERD (gastroesophageal reflux disease)   . Hx of chest pain    "anxiety"  . Hyperlipidemia   . INSOMNIA 10/14/2008   in past  . Joint pain   . Kidney problem   . Low blood sugar    eats several smaller meals a day  . Obesity   . Palpitations    Stress related   . Proteinuria    H/O  . Tobacco abuse    quit smoking 08/2013    PAST SURGICAL HISTORY: Past Surgical History:  Procedure Laterality Date  . ABDOMINAL HYSTERECTOMY    . APPENDECTOMY     with hysterectomy  . childbirth     x4  . CHOLECYSTECTOMY  2001  . EYE SURGERY Bilateral 2015   cataract surgery with lens implant  . NEPHRECTOMY  1992   complex cystic mass - nephrectomy right  . ORIF ANKLE FRACTURE Right 07/02/2014   Procedure: OPEN REDUCTION INTERNAL FIXATION (ORIF) RIGHT ANKLE BIMALLOELAR FRACTURE;  Surgeon: Wylene Simmer, MD;  Location: [facility];  Service: Orthopedics;  Laterality: Right;  . OTHER SURGICAL HISTORY     hysterectomy, R ovary remains  . SHOULDER SURGERY     bone spurs left    SOCIAL HISTORY: Social History   Tobacco Use  . Smoking status: Former Smoker    Packs/day: 1.00    Years: 35.00    Pack years: 35.00    Types: Cigarettes    Last attempt to quit: 09/01/2013    Years since quitting: 4.5  . Smokeless tobacco: Never Used  . Tobacco comment: Former smoker with no desire to smoke again. Smoking years edited at patients request to include periods when she did not smoke.  Substance Use Topics  . Alcohol use: Yes    Alcohol/week: 0.6 oz    Types: 1 Shots of liquor per week  . Drug use: No    FAMILY HISTORY: Family History  Problem Relation Age of Onset  . Stroke Father   . Alzheimer's disease Father        early, states also parkinsons. died 16  . Hypertension Father   . Obesity  Father   . Ovarian cancer Mother        Dr. Jana Hakim   . Arthritis Mother   . Obesity Mother   . Colon cancer Paternal Grandmother   .  Liver disease Daughter        On hospice age 62    ROS: Review of Systems  Constitutional: Negative for weight loss.  Cardiovascular: Negative for chest pain and claudication.  Psychiatric/Behavioral: Positive for depression. Negative for suicidal ideas.    PHYSICAL EXAM: Blood pressure 110/65, pulse 77, height 5\' 8"  (1.727 m), weight 235 lb (106.6 kg), SpO2 98 %. Body mass index is 35.73 kg/m. Physical Exam  Constitutional: She is oriented to person, place, and time. She appears well-developed and well-nourished.  Cardiovascular: Normal rate.  Pulmonary/Chest: Effort normal.  Musculoskeletal: Normal range of motion.  Neurological: She is oriented to person, place, and time.  Skin: Skin is warm and dry.  Psychiatric: She has a normal mood and affect. Her behavior is normal.  Vitals reviewed.   RECENT LABS AND TESTS: BMET    Component Value Date/Time   NA 138 09/18/2017 1049   K 4.2 09/18/2017 1049   CL 98 09/18/2017 1049   CO2 23 09/18/2017 1049   GLUCOSE 96 09/18/2017 1049   GLUCOSE 109 (H) 01/18/2017 0902   BUN 16 09/18/2017 1049   CREATININE 1.19 (H) 09/18/2017 1049   CALCIUM 9.5 09/18/2017 1049   GFRNONAA 46 (L) 09/18/2017 1049   GFRAA 53 (L) 09/18/2017 1049   Lab Results  Component Value Date   HGBA1C 5.7 (H) 09/18/2017   HGBA1C (H) 04/15/2010    5.9 (NOTE)                                                                       According to the ADA Clinical Practice Recommendations for 2011, when HbA1c is used as a screening test:   >=6.5%   Diagnostic of Diabetes Mellitus           (if abnormal result  is confirmed)  5.7-6.4%   Increased risk of developing Diabetes Mellitus  References:Diagnosis and Classification of Diabetes Mellitus,Diabetes XBDZ,3299,24(QASTM 1):S62-S69 and Standards of Medical Care in         Diabetes -  2011,Diabetes Care,2011,34  (Suppl 1):S11-S61.   HGBA1C 5.8 12/31/2007   Lab Results  Component Value Date   INSULIN 25.3 (H) 09/18/2017   CBC    Component Value Date/Time   WBC 6.4 09/18/2017 1049   WBC 6.6 01/18/2017 0902   RBC 4.57 09/18/2017 1049   RBC 4.39 01/18/2017 0902   HGB 13.5 09/18/2017 1049   HCT 40.4 09/18/2017 1049   PLT 240.0 01/18/2017 0902   MCV 88 09/18/2017 1049   MCH 29.5 09/18/2017 1049   MCH 29.4 07/02/2014 1143   MCHC 33.4 09/18/2017 1049   MCHC 33.2 01/18/2017 0902   RDW 15.2 09/18/2017 1049   LYMPHSABS 2.6 09/18/2017 1049   MONOABS 0.9 04/15/2010 2135   EOSABS 0.2 09/18/2017 1049   BASOSABS 0.0 09/18/2017 1049   Iron/TIBC/Ferritin/ %Sat No results found for: IRON, TIBC, FERRITIN, IRONPCTSAT Lipid Panel     Component Value Date/Time   CHOL 161 09/18/2017 1049   TRIG 233 (H) 09/18/2017 1049   HDL 58 09/18/2017 1049   CHOLHDL 2 01/18/2017 0902   VLDL 28.2 01/18/2017 0902   LDLCALC 56 09/18/2017 1049   LDLDIRECT 77.0 01/18/2017 0902   Hepatic Function Panel  Component Value Date/Time   PROT 6.5 09/18/2017 1049   ALBUMIN 4.7 09/18/2017 1049   AST 35 09/18/2017 1049   ALT 42 (H) 09/18/2017 1049   ALKPHOS 92 09/18/2017 1049   BILITOT 0.3 09/18/2017 1049   BILIDIR <0.1 04/15/2010 2135   IBILI NOT CALCULATED 04/15/2010 2135      Component Value Date/Time   TSH 3.430 09/18/2017 1049   TSH 2.09 05/25/2015 0820   TSH 1.89 12/31/2007 0831   Results for Angie Bonus ELIZABETH "[name]" (MRN [id number]) as of 03/18/2018 08:55  Ref. Range 09/18/2017 10:49  Vitamin D, 25-Hydroxy Latest Ref Range: 30.0 - 100.0 ng/mL 45.6   ASSESSMENT AND PLAN: Other hyperlipidemia  Other depression - with emotional eating - Plan: buPROPion (WELLBUTRIN SR) 200 MG 12 hr tablet  Class 2 severe obesity with serious comorbidity and body mass index (BMI) of 35.0 to 35.9 in adult, unspecified obesity type ([place])  PLAN:  Hyperlipidemia Brande was informed of  the American Heart Association Guidelines emphasizing intensive lifestyle modifications as the first line treatment for hyperlipidemia. We discussed many lifestyle modifications today in depth, and Cedrica will continue to work on decreasing saturated fats such as fatty red meat, butter and many fried foods. She will also increase vegetables and lean protein in her diet and continue to work on exercise and weight loss efforts. She will continue her medications as prescribed and will follow up as directed.  Depression with Emotional Eating Behaviors We discussed behavior modification techniques today to help Cheryel deal with her emotional eating and depression. She has agreed to continue Wellbutrin SR 200 mg qd #30 with no refills and follow up as directed.  Obesity Dnyla is currently in the action stage of change. As such, her goal is to continue with weight loss efforts She has agreed to follow the Category 2 plan Mairlyn has been instructed to work up to a goal of 150 minutes of combined cardio and strengthening exercise per week for weight loss and overall health benefits. We discussed the following Behavioral Modification Strategies today: increasing lean protein intake and work on meal planning and easy cooking plans  Kysa has agreed to follow up with our clinic in 2 weeks. She was informed of the importance of frequent follow up visits to maximize her success with intensive lifestyle modifications for her multiple health conditions.    OBESITY BEHAVIORAL INTERVENTION VISIT  Today's visit was # 9 out of 22.  Starting weight: 249 lbs Starting date: 09/18/17 Today's weight : 235 lbs  Today's date: 03/14/2018 Total lbs lost to date: 14 (Patients must lose 7 lbs in the first 6 months to continue with counseling)   ASK: We discussed the diagnosis of obesity with [name] today and Laverle agreed to give [name] permission to discuss obesity behavioral modification therapy  today.  ASSESS: Swayze has the diagnosis of obesity and her BMI today is 35.74 Takaya is in the action stage of change   ADVISE: Jaqulyn was educated on the multiple health risks of obesity as well as the benefit of weight loss to improve her health. She was advised of the need for long term treatment and the importance of lifestyle modifications.  AGREE: Multiple dietary modification options and treatment options were discussed and  Kimberlin agreed to the above obesity treatment plan.   Corey Skains, am acting as transcriptionist for [employer], PA-C I, Lacy Duverney [facility], have reviewed this note and agree with its content

## 2018-03-26 ENCOUNTER — Other Ambulatory Visit: Payer: Self-pay

## 2018-03-26 MED ORDER — CLONAZEPAM 0.5 MG PO TABS: 0.5000 mg | ORAL_TABLET | Freq: Every day | ORAL | 1 refills | Status: DC

## 2018-04-01 ENCOUNTER — Ambulatory Visit (INDEPENDENT_AMBULATORY_CARE_PROVIDER_SITE_OTHER): Payer: Medicare HMO | Admitting: Physician Assistant

## 2018-04-01 VITALS — BP 102/66 | HR 71 | Temp 98.4°F | Ht 68.0 in | Wt 231.0 lb

## 2018-04-01 DIAGNOSIS — E782 Mixed hyperlipidemia: Secondary | ICD-10-CM

## 2018-04-01 DIAGNOSIS — Z6835 Body mass index (BMI) 35.0-35.9, adult: Secondary | ICD-10-CM

## 2018-04-02 NOTE — Progress Notes (Signed)
Office: [phone]  /  Fax: [phone]   HPI:   Chief Complaint: OBESITY Angie Velasquez is here to discuss her progress with her obesity treatment plan. She is on the Category 2 plan and is following her eating plan approximately 75 % of the time. She states she is walking and moving for 30 minutes 4 times per week. Angie Velasquez continues to well with weight loss. She has settled into her new home and states she feels less stressed and is concentrating on her eating.  Her weight is 231 lb (104.8 kg) today and has had a weight loss of 4 pounds over a period of 2 to 3 weeks since her last visit. She has lost 18 lbs since starting treatment with [name].  Hyperlipidemia Angie Velasquez has hyperlipidemia and has been trying to improve her cholesterol levels with intensive lifestyle modification including a low saturated fat diet, exercise and weight loss. She denies any chest pain, claudication or myalgias.  ALLERGIES: Allergies  Allergen Reactions  . Epinephrine Palpitations  . Codeine Nausea Only  . Penicillins Nausea Only    MEDICATIONS: Current Outpatient Medications on File Prior to Visit  Medication Sig Dispense Refill  . acetaminophen (TYLENOL) 500 MG tablet Take 500 mg by mouth every 6 (six) hours as needed.    [name] allopurinol (ZYLOPRIM) 300 MG tablet TAKE 1 TABLET (300 MG TOTAL) ONCE A DAY 90 tablet 3  . aspirin 81 MG tablet Take 81 mg by mouth daily.    [name] buPROPion (WELLBUTRIN SR) 200 MG 12 hr tablet Take 1 tablet (200 mg total) by mouth daily at 12 noon. 30 tablet 0  . CALCIUM-MAGNESIUM-ZINC PO Take by mouth.    . clonazePAM (KLONOPIN) 0.5 MG tablet Take 1 tablet (0.5 mg total) by mouth at bedtime. 90 tablet 1  . diazepam (VALIUM) 10 MG tablet Take 10 mg by mouth every 6 (six) hours as needed (as needed for muscle spasms).    . diclofenac sodium (VOLTAREN) 1 % GEL Apply topically to affected area qid 100 g 2  . gabapentin (NEURONTIN) 300 MG capsule Start with 1 tab po qhs X 1 week, then increase  to 1 tab po bid X 1 week then 1 tab po tid prn 90 capsule 1  . lisinopril (PRINIVIL,ZESTRIL) 2.5 MG tablet Take 1 tablet (2.5 mg total) by mouth daily. 90 tablet 3  . nystatin cream (MYCOSTATIN) Apply 1 application topically 2 (two) times daily. To rash in groin for up to 2 weeks maximum 90 g 2  . OVER THE COUNTER MEDICATION Cherry extract bid    . OVER THE COUNTER MEDICATION B-12 one daily    . OVER THE COUNTER MEDICATION Vitamin D    . polyethylene glycol powder (GLYCOLAX/MIRALAX) powder Take 17 g by mouth daily. 3350 g 0  . sertraline (ZOLOFT) 50 MG tablet Take 1 tablet (50 mg total) by mouth daily. 90 tablet 3  . simvastatin (ZOCOR) 20 MG tablet TAKE 1 TABLET AT BEDTIME 90 tablet 3  . Sodium Bicarbonate (NICE PURE BAKING SODA) POWD by Does not apply route.    . traMADol (ULTRAM) 50 MG tablet Take 1 tablet (50 mg total) by mouth every 6 (six) hours as needed for moderate pain or severe pain. 60 tablet 1  . triamcinolone cream (KENALOG) 0.1 % Apply 1 application topically 2 (two) times daily. For 7-10 days for recurring eczema issues 80 g 1   No current facility-administered medications on file prior to visit.     PAST MEDICAL  HISTORY: Past Medical History:  Diagnosis Date  . Anxiety    years ago - no longer an issue  . Arthritis    gout  . Cataracts, bilateral   . Complication of anesthesia   . Fracture of pelvis ([place])   . GERD (gastroesophageal reflux disease)   . Hx of chest pain    "anxiety"  . Hyperlipidemia   . INSOMNIA 10/14/2008   in past  . Joint pain   . Kidney problem   . Low blood sugar    eats several smaller meals a day  . Obesity   . Palpitations    Stress related   . Proteinuria    H/O  . Tobacco abuse    quit smoking 08/2013    PAST SURGICAL HISTORY: Past Surgical History:  Procedure Laterality Date  . ABDOMINAL HYSTERECTOMY    . APPENDECTOMY     with hysterectomy  . childbirth     x4  . CHOLECYSTECTOMY  2001  . EYE SURGERY Bilateral 2015    cataract surgery with lens implant  . NEPHRECTOMY  1992   complex cystic mass - nephrectomy right  . ORIF ANKLE FRACTURE Right 07/02/2014   Procedure: OPEN REDUCTION INTERNAL FIXATION (ORIF) RIGHT ANKLE BIMALLOELAR FRACTURE;  Surgeon: Wylene Simmer, MD;  Location: [facility];  Service: Orthopedics;  Laterality: Right;  . OTHER SURGICAL HISTORY     hysterectomy, R ovary remains  . SHOULDER SURGERY     bone spurs left    SOCIAL HISTORY: Social History   Tobacco Use  . Smoking status: Former Smoker    Packs/day: 1.00    Years: 35.00    Pack years: 35.00    Types: Cigarettes    Last attempt to quit: 09/01/2013    Years since quitting: 4.5  . Smokeless tobacco: Never Used  . Tobacco comment: Former smoker with no desire to smoke again. Smoking years edited at patients request to include periods when she did not smoke.  Substance Use Topics  . Alcohol use: Yes    Alcohol/week: 0.6 oz    Types: 1 Shots of liquor per week  . Drug use: No    FAMILY HISTORY: Family History  Problem Relation Age of Onset  . Stroke Father   . Alzheimer's disease Father        early, states also parkinsons. died 19  . Hypertension Father   . Obesity Father   . Ovarian cancer Mother        Dr. Jana Hakim   . Arthritis Mother   . Obesity Mother   . Colon cancer Paternal Grandmother   . Liver disease Daughter        On hospice age 76    ROS: Review of Systems  Constitutional: Positive for weight loss.  Cardiovascular: Negative for chest pain and claudication.  Musculoskeletal: Negative for myalgias.    PHYSICAL EXAM: Blood pressure 102/66, pulse 71, temperature 98.4 F (36.9 C), temperature source Oral, height 5\' 8"  (1.727 m), weight 231 lb (104.8 kg), SpO2 98 %. Body mass index is 35.12 kg/m. Physical Exam  Constitutional: She is oriented to person, place, and time. She appears well-developed and well-nourished.  Cardiovascular: Normal rate.  Pulmonary/Chest: Effort normal.  Musculoskeletal:  Normal range of motion.  Neurological: She is oriented to person, place, and time.  Skin: Skin is warm and dry.  Psychiatric: She has a normal mood and affect. Her behavior is normal.  Vitals reviewed.   RECENT LABS AND TESTS: BMET  Component Value Date/Time   NA 138 09/18/2017 1049   K 4.2 09/18/2017 1049   CL 98 09/18/2017 1049   CO2 23 09/18/2017 1049   GLUCOSE 96 09/18/2017 1049   GLUCOSE 109 (H) 01/18/2017 0902   BUN 16 09/18/2017 1049   CREATININE 1.19 (H) 09/18/2017 1049   CALCIUM 9.5 09/18/2017 1049   GFRNONAA 46 (L) 09/18/2017 1049   GFRAA 53 (L) 09/18/2017 1049   Lab Results  Component Value Date   HGBA1C 5.7 (H) 09/18/2017   HGBA1C (H) 04/15/2010    5.9 (NOTE)                                                                       According to the ADA Clinical Practice Recommendations for 2011, when HbA1c is used as a screening test:   >=6.5%   Diagnostic of Diabetes Mellitus           (if abnormal result  is confirmed)  5.7-6.4%   Increased risk of developing Diabetes Mellitus  References:Diagnosis and Classification of Diabetes Mellitus,Diabetes EXBM,8413,24(MWNUU 1):S62-S69 and Standards of Medical Care in         Diabetes - 2011,Diabetes Care,2011,34  (Suppl 1):S11-S61.   HGBA1C 5.8 12/31/2007   Lab Results  Component Value Date   INSULIN 25.3 (H) 09/18/2017   CBC    Component Value Date/Time   WBC 6.4 09/18/2017 1049   WBC 6.6 01/18/2017 0902   RBC 4.57 09/18/2017 1049   RBC 4.39 01/18/2017 0902   HGB 13.5 09/18/2017 1049   HCT 40.4 09/18/2017 1049   PLT 240.0 01/18/2017 0902   MCV 88 09/18/2017 1049   MCH 29.5 09/18/2017 1049   MCH 29.4 07/02/2014 1143   MCHC 33.4 09/18/2017 1049   MCHC 33.2 01/18/2017 0902   RDW 15.2 09/18/2017 1049   LYMPHSABS 2.6 09/18/2017 1049   MONOABS 0.9 04/15/2010 2135   EOSABS 0.2 09/18/2017 1049   BASOSABS 0.0 09/18/2017 1049   Iron/TIBC/Ferritin/ %Sat No results found for: IRON, TIBC, FERRITIN, IRONPCTSAT Lipid  Panel     Component Value Date/Time   CHOL 161 09/18/2017 1049   TRIG 233 (H) 09/18/2017 1049   HDL 58 09/18/2017 1049   CHOLHDL 2 01/18/2017 0902   VLDL 28.2 01/18/2017 0902   LDLCALC 56 09/18/2017 1049   LDLDIRECT 77.0 01/18/2017 0902   Hepatic Function Panel     Component Value Date/Time   PROT 6.5 09/18/2017 1049   ALBUMIN 4.7 09/18/2017 1049   AST 35 09/18/2017 1049   ALT 42 (H) 09/18/2017 1049   ALKPHOS 92 09/18/2017 1049   BILITOT 0.3 09/18/2017 1049   BILIDIR <0.1 04/15/2010 2135   IBILI NOT CALCULATED 04/15/2010 2135      Component Value Date/Time   TSH 3.430 09/18/2017 1049   TSH 2.09 05/25/2015 0820   TSH 1.89 12/31/2007 0831    ASSESSMENT AND PLAN: Mixed hyperlipidemia  Class 2 severe obesity with serious comorbidity and body mass index (BMI) of 35.0 to 35.9 in adult, unspecified obesity type ([place])  PLAN:  Hyperlipidemia Angie Velasquez was informed of the American Heart Association Guidelines emphasizing intensive lifestyle modifications as the first line treatment for hyperlipidemia. We discussed many lifestyle modifications today in depth, and Angie Velasquez will continue to  work on decreasing saturated fats such as fatty red meat, butter and many fried foods. She will also increase vegetables and lean protein in her diet and continue to work on exercise and weight loss efforts. Angie Velasquez agrees to continue her medications and she agrees to follow up with our clinic in 2 weeks.  We spent > than 50% of the 15 minute visit on the counseling as documented in the note.  Obesity Angie Velasquez is currently in the action stage of change. As such, her goal is to continue with weight loss efforts She has agreed to follow the Category 2 plan Angie Velasquez has been instructed to work up to a goal of 150 minutes of combined cardio and strengthening exercise per week for weight loss and overall health benefits. We discussed the following Behavioral Modification Strategies today: increasing lean  protein intake and work on meal planning and easy cooking plans   Angie Velasquez has agreed to follow up with our clinic in 2 weeks. She was informed of the importance of frequent follow up visits to maximize her success with intensive lifestyle modifications for her multiple health conditions.   OBESITY BEHAVIORAL INTERVENTION VISIT  Today's visit was # 10 out of 22.  Starting weight: 249 lbs Starting date: 09/18/17 Today's weight : 231 lbs Today's date: 04/01/2018 Total lbs lost to date: 49 (Patients must lose 7 lbs in the first 6 months to continue with counseling)   ASK: We discussed the diagnosis of obesity with Angie Velasquez today and Angie Velasquez agreed to give [name] permission to discuss obesity behavioral modification therapy today.  ASSESS: Angie Velasquez has the diagnosis of obesity and her BMI today is 35.13 Angie Velasquez is in the action stage of change   ADVISE: Angie Velasquez was educated on the multiple health risks of obesity as well as the benefit of weight loss to improve her health. She was advised of the need for long term treatment and the importance of lifestyle modifications.  AGREE: Multiple dietary modification options and treatment options were discussed and  Angie Velasquez agreed to the above obesity treatment plan.   Angie Velasquez, am acting as transcriptionist for Lacy Duverney, PA-C I, Lacy Duverney [facility], have reviewed this note and agree with its content

## 2018-04-05 ENCOUNTER — Other Ambulatory Visit: Payer: Self-pay | Admitting: Family Medicine

## 2018-04-05 ENCOUNTER — Other Ambulatory Visit (INDEPENDENT_AMBULATORY_CARE_PROVIDER_SITE_OTHER): Payer: Self-pay | Admitting: Physician Assistant

## 2018-04-05 DIAGNOSIS — F329 Major depressive disorder, single episode, unspecified: Secondary | ICD-10-CM

## 2018-04-05 DIAGNOSIS — F3289 Other specified depressive episodes: Secondary | ICD-10-CM

## 2018-04-10 ENCOUNTER — Other Ambulatory Visit: Payer: Self-pay

## 2018-04-10 MED ORDER — GABAPENTIN 300 MG PO CAPS: ORAL_CAPSULE | ORAL | 1 refills | Status: DC

## 2018-04-12 ENCOUNTER — Encounter: Payer: Self-pay | Admitting: Sports Medicine

## 2018-04-12 NOTE — Procedures (Signed)
PROCEDURE NOTE:  Ultrasound Guided: Injection: Pubic symphysis Images were obtained and interpreted by myself, Teresa Coombs, DO  Images have been saved and stored to PACS system. Images obtained on: GE S7 Ultrasound machine    ULTRASOUND FINDINGS:  Marked mushroom sign of the pubic symphysis  DESCRIPTION OF PROCEDURE:  The patient's clinical condition is marked by substantial pain and/or significant functional disability. Other conservative therapy has not provided relief, is contraindicated, or not appropriate. There is a reasonable likelihood that injection will significantly improve the patient's pain and/or functional impairment.   After discussing the risks, benefits and expected outcomes of the injection and all questions were reviewed and answered, the patient wished to undergo the above named procedure.  Verbal consent was obtained.  The ultrasound was used to identify the target structure and adjacent neurovascular structures. The skin was then prepped in sterile fashion and the target structure was injected under direct visualization using sterile technique as below:  PREP: Alcohol and Ethel Chloride APPROACH: direct, single injection, 25g 1.5 in. INJECTATE: 1 cc 0.5% Marcaine and 1 cc 40mg /mL DepoMedrol ASPIRATE: None DRESSING: Band-Aid  Post procedural instructions including recommending icing and warning signs for infection were reviewed.    This procedure was well tolerated and there were no complications.   IMPRESSION: Succesful Ultrasound Guided: Injection

## 2018-04-16 ENCOUNTER — Ambulatory Visit (INDEPENDENT_AMBULATORY_CARE_PROVIDER_SITE_OTHER): Payer: Medicare HMO | Admitting: Physician Assistant

## 2018-04-16 VITALS — BP 103/67 | HR 78 | Temp 98.3°F | Ht 68.0 in | Wt 231.0 lb

## 2018-04-16 DIAGNOSIS — F3289 Other specified depressive episodes: Secondary | ICD-10-CM

## 2018-04-16 DIAGNOSIS — K5909 Other constipation: Secondary | ICD-10-CM | POA: Diagnosis not present

## 2018-04-16 DIAGNOSIS — Z6835 Body mass index (BMI) 35.0-35.9, adult: Secondary | ICD-10-CM | POA: Diagnosis not present

## 2018-04-16 DIAGNOSIS — M25512 Pain in left shoulder: Secondary | ICD-10-CM | POA: Diagnosis not present

## 2018-04-16 DIAGNOSIS — M7542 Impingement syndrome of left shoulder: Secondary | ICD-10-CM | POA: Diagnosis not present

## 2018-04-16 MED ORDER — POLYETHYLENE GLYCOL 3350 17 GM/SCOOP PO POWD: 17.0000 g | Freq: Every day | ORAL | 0 refills | Status: DC

## 2018-04-16 MED ORDER — BUPROPION HCL ER (SR) 200 MG PO TB12: 200.0000 mg | ORAL_TABLET | Freq: Every day | ORAL | 0 refills | Status: DC

## 2018-04-16 NOTE — Progress Notes (Signed)
Office: [phone]  /  Fax: [phone]   HPI:   Chief Complaint: Angie Velasquez Angie Velasquez is here to discuss her progress with her Angie Velasquez treatment plan. She is on the Category 2 plan and is following her eating plan approximately 75 % of the time. She states she is doing stretches and walking for 20 minutes 3 times per week. Angie Velasquez maintained her weight. She is mindful of her eating and controls her portions. She would like more breakfast options.  Her weight is 231 lb (104.8 kg) today and has not lost weight since her last visit. She has lost 18 lbs since starting treatment with [name].  Constipation Angie Velasquez notes constipation for the last few weeks, worse since attempting weight loss. She states BM are less frequent and are not hard and painful. She denies hematochezia or melena. She denies drinking less H20 recently.  Depression with emotional eating behaviors Angie Velasquez is struggling with emotional eating and using food for comfort to the extent that it is negatively impacting her health. She often snacks when she is not hungry. Angie Velasquez sometimes feels she is out of control and then feels guilty that she made poor food choices. She has been working on behavior modification techniques to help reduce her emotional eating and has been somewhat successful. Her mood is stable and she shows no sign of suicidal or homicidal ideations.  Depression screen Angie Velasquez 2/9 01/10/2018 09/18/2017 09/05/2017 03/26/2017 03/26/2017  Decreased Interest 1 1 2  0 0  Down, Depressed, Hopeless 2 1 2 1  0  PHQ - 2 Score 3 2 4 1  0  Altered sleeping 0 0 0 - -  Tired, decreased energy 0 1 1 - -  Change in appetite 0 1 0 - -  Feeling bad or failure about yourself  0 1 0 - -  Trouble concentrating 0 1 0 - -  Moving slowly or fidgety/restless 0 0 0 - -  Suicidal thoughts 0 1 0 - -  PHQ-9 Score 3 7 5  - -  Difficult doing work/chores Somewhat difficult Not difficult at all Somewhat difficult - -    ALLERGIES: Allergies  Allergen  Reactions  . Epinephrine Palpitations  . Codeine Nausea Only  . Penicillins Nausea Only    MEDICATIONS: Current Outpatient Medications on File Prior to Visit  Medication Sig Dispense Refill  . acetaminophen (TYLENOL) 500 MG tablet Take 500 mg by mouth every 6 (six) hours as needed.    [name] allopurinol (ZYLOPRIM) 300 MG tablet TAKE 1 TABLET (300 MG TOTAL) ONCE A DAY 90 tablet 3  . aspirin 81 MG tablet Take 81 mg by mouth daily.    [name] CALCIUM-MAGNESIUM-ZINC PO Take by mouth.    . clonazePAM (KLONOPIN) 0.5 MG tablet Take 1 tablet (0.5 mg total) by mouth at bedtime. 90 tablet 1  . diazepam (VALIUM) 10 MG tablet Take 10 mg by mouth every 6 (six) hours as needed (as needed for muscle spasms).    . diclofenac sodium (VOLTAREN) 1 % GEL Apply topically to affected area qid 100 g 2  . gabapentin (NEURONTIN) 300 MG capsule Take 1 capsule, orally, up to TID or as directed 270 capsule 1  . lisinopril (PRINIVIL,ZESTRIL) 2.5 MG tablet Take 1 tablet (2.5 mg total) by mouth daily. 90 tablet 3  . nystatin cream (MYCOSTATIN) Apply 1 application topically 2 (two) times daily. To rash in groin for up to 2 weeks maximum 90 g 2  . OVER THE COUNTER MEDICATION Cherry extract bid    .  OVER THE COUNTER MEDICATION B-12 one daily    . OVER THE COUNTER MEDICATION Vitamin D    . polyethylene glycol powder (GLYCOLAX/MIRALAX) powder Take 17 g by mouth daily. 3350 g 0  . sertraline (ZOLOFT) 50 MG tablet TAKE 1 TABLET (50 MG TOTAL) BY MOUTH DAILY. 90 tablet 3  . simvastatin (ZOCOR) 20 MG tablet TAKE 1 TABLET AT BEDTIME 90 tablet 3  . Sodium Bicarbonate (NICE PURE BAKING SODA) POWD by Does not apply route.    . traMADol (ULTRAM) 50 MG tablet Take 1 tablet (50 mg total) by mouth every 6 (six) hours as needed for moderate pain or severe pain. 60 tablet 1  . triamcinolone cream (KENALOG) 0.1 % Apply 1 application topically 2 (two) times daily. For 7-10 days for recurring eczema issues 80 g 1   No current facility-administered  medications on file prior to visit.     PAST MEDICAL HISTORY: Past Medical History:  Diagnosis Date  . Anxiety    years ago - no longer an issue  . Arthritis    gout  . Cataracts, bilateral   . Complication of anesthesia   . Fracture of pelvis ([place])   . GERD (gastroesophageal reflux disease)   . Hx of chest pain    "anxiety"  . Hyperlipidemia   . INSOMNIA 10/14/2008   in past  . Joint pain   . Kidney problem   . Low blood sugar    eats several smaller meals a day  . Angie Velasquez   . Palpitations    Stress related   . Proteinuria    H/O  . Tobacco abuse    quit smoking 08/2013    PAST SURGICAL HISTORY: Past Surgical History:  Procedure Laterality Date  . ABDOMINAL HYSTERECTOMY    . APPENDECTOMY     with hysterectomy  . childbirth     x4  . CHOLECYSTECTOMY  2001  . EYE SURGERY Bilateral 2015   cataract surgery with lens implant  . NEPHRECTOMY  1992   complex cystic mass - nephrectomy right  . ORIF ANKLE FRACTURE Right 07/02/2014   Procedure: OPEN REDUCTION INTERNAL FIXATION (ORIF) RIGHT ANKLE BIMALLOELAR FRACTURE;  Surgeon: Wylene Simmer, MD;  Location: [facility];  Service: Orthopedics;  Laterality: Right;  . OTHER SURGICAL HISTORY     hysterectomy, R ovary remains  . SHOULDER SURGERY     bone spurs left    SOCIAL HISTORY: Social History   Tobacco Use  . Smoking status: Former Smoker    Packs/day: 1.00    Years: 35.00    Pack years: 35.00    Types: Cigarettes    Last attempt to quit: 09/01/2013    Years since quitting: 4.6  . Smokeless tobacco: Never Used  . Tobacco comment: Former smoker with no desire to smoke again. Smoking years edited at patients request to include periods when she did not smoke.  Substance Use Topics  . Alcohol use: Yes    Alcohol/week: 0.6 oz    Types: 1 Shots of liquor per week  . Drug use: No    FAMILY HISTORY: Family History  Problem Relation Age of Onset  . Stroke Father   . Alzheimer's disease Father        early, states also  parkinsons. died 42  . Hypertension Father   . Angie Velasquez Father   . Ovarian cancer Mother        Dr. Jana Hakim   . Arthritis Mother   . Angie Velasquez Mother   .  Colon cancer Paternal Grandmother   . Liver disease Daughter        On hospice age 43    ROS: Review of Systems  Constitutional: Negative for weight loss.  Gastrointestinal: Positive for constipation. Negative for melena.       Negative hematochezia  Psychiatric/Behavioral: Positive for depression. Negative for suicidal ideas.    PHYSICAL EXAM: Blood pressure 103/67, pulse 78, temperature 98.3 F (36.8 C), temperature source Oral, height 5\' 8"  (1.727 m), weight 231 lb (104.8 kg), SpO2 97 %. Body mass index is 35.12 kg/m. Physical Exam  Constitutional: She is oriented to person, place, and time. She appears well-developed and well-nourished.  Cardiovascular: Normal rate.  Pulmonary/Chest: Effort normal.  Musculoskeletal: Normal range of motion.  Neurological: She is oriented to person, place, and time.  Skin: Skin is warm and dry.  Psychiatric: She has a normal mood and affect. Her behavior is normal.  Vitals reviewed.   RECENT LABS AND TESTS: BMET    Component Value Date/Time   NA 138 09/18/2017 1049   K 4.2 09/18/2017 1049   CL 98 09/18/2017 1049   CO2 23 09/18/2017 1049   GLUCOSE 96 09/18/2017 1049   GLUCOSE 109 (H) 01/18/2017 0902   BUN 16 09/18/2017 1049   CREATININE 1.19 (H) 09/18/2017 1049   CALCIUM 9.5 09/18/2017 1049   GFRNONAA 46 (L) 09/18/2017 1049   GFRAA 53 (L) 09/18/2017 1049   Lab Results  Component Value Date   HGBA1C 5.7 (H) 09/18/2017   HGBA1C (H) 04/15/2010    5.9 (NOTE)                                                                       According to the ADA Clinical Practice Recommendations for 2011, when HbA1c is used as a screening test:   >=6.5%   Diagnostic of Diabetes Mellitus           (if abnormal result  is confirmed)  5.7-6.4%   Increased risk of developing Diabetes Mellitus   References:Diagnosis and Classification of Diabetes Mellitus,Diabetes HDQQ,2297,98(XQJJH 1):S62-S69 and Standards of Medical Care in         Diabetes - 2011,Diabetes Care,2011,34  (Suppl 1):S11-S61.   HGBA1C 5.8 12/31/2007   Lab Results  Component Value Date   INSULIN 25.3 (H) 09/18/2017   CBC    Component Value Date/Time   WBC 6.4 09/18/2017 1049   WBC 6.6 01/18/2017 0902   RBC 4.57 09/18/2017 1049   RBC 4.39 01/18/2017 0902   HGB 13.5 09/18/2017 1049   HCT 40.4 09/18/2017 1049   PLT 240.0 01/18/2017 0902   MCV 88 09/18/2017 1049   MCH 29.5 09/18/2017 1049   MCH 29.4 07/02/2014 1143   MCHC 33.4 09/18/2017 1049   MCHC 33.2 01/18/2017 0902   RDW 15.2 09/18/2017 1049   LYMPHSABS 2.6 09/18/2017 1049   MONOABS 0.9 04/15/2010 2135   EOSABS 0.2 09/18/2017 1049   BASOSABS 0.0 09/18/2017 1049   Iron/TIBC/Ferritin/ %Sat No results found for: IRON, TIBC, FERRITIN, IRONPCTSAT Lipid Panel     Component Value Date/Time   CHOL 161 09/18/2017 1049   TRIG 233 (H) 09/18/2017 1049   HDL 58 09/18/2017 1049   CHOLHDL 2 01/18/2017 0902   VLDL 28.2  01/18/2017 0902   LDLCALC 56 09/18/2017 1049   LDLDIRECT 77.0 01/18/2017 0902   Hepatic Function Panel     Component Value Date/Time   PROT 6.5 09/18/2017 1049   ALBUMIN 4.7 09/18/2017 1049   AST 35 09/18/2017 1049   ALT 42 (H) 09/18/2017 1049   ALKPHOS 92 09/18/2017 1049   BILITOT 0.3 09/18/2017 1049   BILIDIR <0.1 04/15/2010 2135   IBILI NOT CALCULATED 04/15/2010 2135      Component Value Date/Time   TSH 3.430 09/18/2017 1049   TSH 2.09 05/25/2015 0820   TSH 1.89 12/31/2007 0831    ASSESSMENT AND PLAN: Other constipation - Plan: polyethylene glycol powder (GLYCOLAX/MIRALAX) powder  Other depression - with emotional eating - Plan: buPROPion (WELLBUTRIN SR) 200 MG 12 hr tablet  Class 2 severe Angie Velasquez with serious comorbidity and body mass index (BMI) of 35.0 to 35.9 in adult, unspecified Angie Velasquez type  (Angie Velasquez)  PLAN:  Constipation Angie Velasquez was informed decrease bowel movement frequency is normal while losing weight, but stools should not be hard or painful. She was advised to increase her H20 intake and work on increasing her fiber intake. High fiber foods were discussed today. Angie Velasquez agrees to start miralax 17 g 1 capful daily and she agrees to follow up with our clinic in 3 weeks.  Depression with Emotional Eating Behaviors We discussed behavior modification techniques today to help Angie Velasquez deal with her emotional eating and depression. Angie Velasquez agrees to continue taking Wellbutrin SR 200 mg qd #30 and we will refill for 1 month. Angie Velasquez agrees to follow up with our clinic in 3 weeks.  Angie Velasquez Angie Velasquez is currently in the action stage of change. As such, her goal is to continue with weight loss efforts She has agreed to follow the Category 2 plan Angie Velasquez has been instructed to work up to a goal of 150 minutes of combined cardio and strengthening exercise per week for weight loss and overall health benefits. We discussed the following Behavioral Modification Strategies today: increasing lean protein intake and work on meal planning and easy cooking plans   Angie Velasquez has agreed to follow up with our clinic in 3 weeks. She was informed of the importance of frequent follow up visits to maximize her success with intensive lifestyle modifications for her multiple health conditions.   Angie Velasquez BEHAVIORAL INTERVENTION VISIT  Today's visit was # 11 out of 22.  Starting weight: 249 lbs Starting date: 09/18/17 Today's weight : 231 lbs  Today's date: 04/16/2018 Total lbs lost to date: 84 (Patients must lose 7 lbs in the first 6 months to continue with counseling)   ASK: We discussed the diagnosis of Angie Velasquez with Angie Velasquez today and Angie Velasquez agreed to give [name] permission to discuss Angie Velasquez behavioral modification therapy today.  ASSESS: Angie Velasquez has the diagnosis of Angie Velasquez and her BMI  today is 35.13 Angie Velasquez is in the action stage of change   ADVISE: Angie Velasquez was educated on the multiple health risks of Angie Velasquez as well as the benefit of weight loss to improve her health. She was advised of the need for long term treatment and the importance of lifestyle modifications.  AGREE: Multiple dietary modification options and treatment options were discussed and  Angie Velasquez agreed to the above Angie Velasquez treatment plan.   Wilhemena Durie, am acting as transcriptionist for Lacy Duverney, PA-C I, Lacy Duverney [facility], have reviewed this note and agree with its content

## 2018-04-23 ENCOUNTER — Ambulatory Visit (INDEPENDENT_AMBULATORY_CARE_PROVIDER_SITE_OTHER): Payer: Medicare HMO | Admitting: Family Medicine

## 2018-04-23 ENCOUNTER — Ambulatory Visit: Payer: Medicare HMO | Admitting: Family Medicine

## 2018-04-23 ENCOUNTER — Encounter: Payer: Self-pay | Admitting: Family Medicine

## 2018-04-23 VITALS — BP 108/72 | HR 80 | Temp 98.3°F | Ht 68.0 in | Wt 231.0 lb

## 2018-04-23 DIAGNOSIS — J0110 Acute frontal sinusitis, unspecified: Secondary | ICD-10-CM

## 2018-04-23 DIAGNOSIS — J029 Acute pharyngitis, unspecified: Secondary | ICD-10-CM | POA: Diagnosis not present

## 2018-04-23 LAB — POCT RAPID STREP A (OFFICE): RAPID STREP A SCREEN: NEGATIVE

## 2018-04-23 MED ORDER — AZITHROMYCIN 250 MG PO TABS: ORAL_TABLET | ORAL | 0 refills | Status: DC

## 2018-04-23 NOTE — Progress Notes (Signed)
PCP: Marin Olp, MD  Subjective:  Angie Velasquez is a 72 y.o. year old very pleasant female patient who presents with sinusitis symptoms including sore throat and sinus pressure.  She wanted to rule out strep throat  about a week of sore throat- also some left ear pain and left frontal sinus tenderness. Noted a few canker sores in mouth on lef- rinising with baking soda and water and those reseolved. Tried to sleep on right side. Flonase hasnt helped. No fever or chills. Has had some sinus pressure- worse on the left. Seems to get worse throughout the day- sometime feels better in AM.  -previous treatments: has tried a few doses of mucinex - denies watery itchy eyes, sneezing, runny nose  ROS-denies fever, SOB, NVD, tooth pain  Pertinent Past Medical History-  Patient Active Problem List   Diagnosis Date Noted  . Depression 12/19/2016    Priority: High  . Anxiety state 07/20/2016    Priority: High  . S/p nephrectomy 03/12/2015    Priority: High  . Essential hypertension 01/04/2016    Priority: Medium  . Gout 03/12/2015    Priority: Medium  . Hyperlipidemia 03/12/2015    Priority: Medium  . COPD (chronic obstructive pulmonary disease) ([place]) 03/12/2015    Priority: Medium  . Arthritis     Priority: Medium  . Former smoker     Priority: Medium  . MICROALBUMINURIA 01/23/2008    Priority: Medium  . Eczema 06/29/2015    Priority: Low  . Tinnitus 03/12/2015    Priority: Low  . History of colonic polyps 03/12/2015    Priority: Low  . Bimalleolar fracture 07/02/2014    Priority: Low  . GERD 01/23/2008    Priority: Low  . Microscopic hematuria 01/23/2008    Priority: Low  . UTI (urinary tract infection) 01/08/2008    Priority: Low  . Osteoarthritis of spine with radiculopathy, cervical region 02/12/2018  . Intertrigo 01/10/2018  . Pes anserinus bursitis of left knee 01/02/2018  . Osteitis pubis ([place]) 12/19/2017  . Prediabetes 10/22/2017  . Other fatigue  09/18/2017  . Shortness of breath on exertion 09/18/2017  . Vitamin D deficiency 09/18/2017  . Hyperglycemia 09/18/2017  . Class 2 severe obesity with serious comorbidity and body mass index (BMI) of 35.0 to 35.9 in adult ([place]) 06/13/2017  . Atypical chest pain 03/26/2017    Medications- reviewed  Current Outpatient Medications  Medication Sig Dispense Refill  . acetaminophen (TYLENOL) 500 MG tablet Take 500 mg by mouth every 6 (six) hours as needed.    [name] allopurinol (ZYLOPRIM) 300 MG tablet TAKE 1 TABLET (300 MG TOTAL) ONCE A DAY 90 tablet 3  . aspirin 81 MG tablet Take 81 mg by mouth daily.    [name] buPROPion (WELLBUTRIN SR) 200 MG 12 hr tablet Take 1 tablet (200 mg total) by mouth daily at 12 noon. 30 tablet 0  . CALCIUM-MAGNESIUM-ZINC PO Take by mouth.    . clonazePAM (KLONOPIN) 0.5 MG tablet Take 1 tablet (0.5 mg total) by mouth at bedtime. 90 tablet 1  . diazepam (VALIUM) 10 MG tablet Take 10 mg by mouth every 6 (six) hours as needed (as needed for muscle spasms).    . diclofenac sodium (VOLTAREN) 1 % GEL Apply topically to affected area qid 100 g 2  . gabapentin (NEURONTIN) 300 MG capsule Take 1 capsule, orally, up to TID or as directed 270 capsule 1  . lisinopril (PRINIVIL,ZESTRIL) 2.5 MG tablet Take 1 tablet (2.5 mg total)  by mouth daily. 90 tablet 3  . nystatin cream (MYCOSTATIN) Apply 1 application topically 2 (two) times daily. To rash in groin for up to 2 weeks maximum 90 g 2  . OVER THE COUNTER MEDICATION Cherry extract bid    . OVER THE COUNTER MEDICATION B-12 one daily    . OVER THE COUNTER MEDICATION Vitamin D    . polyethylene glycol powder (GLYCOLAX/MIRALAX) powder Take 17 g by mouth daily. 3350 g 0  . polyethylene glycol powder (GLYCOLAX/MIRALAX) powder Take 17 g by mouth daily. 3350 g 0  . sertraline (ZOLOFT) 50 MG tablet TAKE 1 TABLET (50 MG TOTAL) BY MOUTH DAILY. 90 tablet 3  . simvastatin (ZOCOR) 20 MG tablet TAKE 1 TABLET AT BEDTIME 90 tablet 3  . Sodium Bicarbonate  (NICE PURE BAKING SODA) POWD by Does not apply route.    . traMADol (ULTRAM) 50 MG tablet Take 1 tablet (50 mg total) by mouth every 6 (six) hours as needed for moderate pain or severe pain. 60 tablet 1  . triamcinolone cream (KENALOG) 0.1 % Apply 1 application topically 2 (two) times daily. For 7-10 days for recurring eczema issues 80 g 1  . azithromycin (ZITHROMAX) 250 MG tablet Take 2 tabs on day 1, then 1 tab daily until finished 6 tablet 0   No current facility-administered medications for this visit.     Objective: BP 108/72 (BP Location: Left Arm, Patient Position: Sitting, Cuff Size: Large)   Pulse 80   Temp 98.3 F (36.8 C) (Oral)   Ht 5\' 8"  (1.727 m)   Wt 231 lb (104.8 kg)   SpO2 95%   BMI 35.12 kg/m  Gen: NAD, resting comfortably HEENT: Turbinates erythematous without drainage, TM normal, pharynx mildly erythematous with no tonsilar exudate or edema, minimal left frontal sinus tenderness CV: RRR no murmurs rubs or gallops Lungs: CTAB no crackles, wheeze, rhonchi Abdomen: soft/nontender/nondistended/normal bowel sounds.  Obese Ext: no edema Skin: warm, dry, no rash Neuro: grossly normal, moves all extremities  Results for orders placed or performed in visit on 04/23/18 (from the past 24 hour(s))  POCT rapid strep A     Status: None   Collection Time: 04/23/18  4:23 PM  Result Value Ref Range   Rapid Strep A Screen Negative Negative   Assessment/Plan:  Sinsusitis Viral based on <10 days, no double sickening, lack of severity of symptoms in first 3 days. Educated on signs that bacterial infection may have developed (symptoms over 10 days, double sickening).   Treatment: -considered steroid: we opted out as has had recent steroid shot -other symptomatic care with mucinex -Antibiotic indicated: no but at 7 days and not improving yet so we said if she is not better in next 3 days- she can start the azithromycin which was printed for her today.   Finally, we reviewed  reasons to return to care including if symptoms worsen or persist or new concerns arise (particularly fever or shortness of breath)  Only to be used if symptoms last another 3 days or if worsen again Meds ordered this encounter  Medications  . azithromycin (ZITHROMAX) 250 MG tablet    Sig: Take 2 tabs on day 1, then 1 tab daily until finished    Dispense:  6 tablet    Refill:  0    Garret Reddish, MD

## 2018-04-23 NOTE — Patient Instructions (Signed)
Sinsusitis Viral based on <10 days, no double sickening, lack of severity of symptoms in first 3 days. Educated on signs that bacterial infection may have developed (symptoms over 10 days, double sickening).   Treatment: -considered steroid: we opted out as has had recent steroid shot -other symptomatic care with mucinex -Antibiotic indicated: no but at 7 days and not improving yet so we said if she is not better in next 3 days- she can start the azithromycin which was printed for her today.   Finally, we reviewed reasons to return to care including if symptoms worsen or persist or new concerns arise (particularly fever or shortness of breath)  Meds ordered this encounter  Medications  . azithromycin (ZITHROMAX) 250 MG tablet    Sig: Take 2 tabs on day 1, then 1 tab daily until finished    Dispense:  6 tablet    Refill:  0

## 2018-04-30 ENCOUNTER — Encounter: Payer: Self-pay | Admitting: Family Medicine

## 2018-05-09 ENCOUNTER — Ambulatory Visit (INDEPENDENT_AMBULATORY_CARE_PROVIDER_SITE_OTHER): Payer: Medicare HMO | Admitting: Physician Assistant

## 2018-05-09 ENCOUNTER — Other Ambulatory Visit: Payer: Self-pay | Admitting: Family Medicine

## 2018-05-09 ENCOUNTER — Ambulatory Visit: Payer: Medicare HMO | Admitting: Sports Medicine

## 2018-05-09 VITALS — BP 101/68 | HR 77 | Temp 97.7°F | Ht 68.0 in | Wt 230.0 lb

## 2018-05-09 DIAGNOSIS — R7303 Prediabetes: Secondary | ICD-10-CM

## 2018-05-09 DIAGNOSIS — Z6835 Body mass index (BMI) 35.0-35.9, adult: Secondary | ICD-10-CM | POA: Diagnosis not present

## 2018-05-09 NOTE — Telephone Encounter (Signed)
Klonopin refill Last Refill:03/26/18 # 90 with 1 refill was sent to [facility], but pt is now requesting medication to be sent to [facility] on [address]. Pt's husband states he has been her his prescription of the medication since she is currently out.   Last OV: 04/23/18 PCP: Dr. Yong Channel [facility]acy:[facility] [place]

## 2018-05-09 NOTE — Progress Notes (Signed)
Office: [phone]  /  Fax: [phone]   HPI:   Chief Complaint: OBESITY Angie Velasquez is here to discuss her progress with her obesity treatment plan. She is on the Category 2 plan and is following her eating plan approximately 70 % of the time. She states she is stretching for 10 minutes 7 times per week. Angie Velasquez continues to do well with weight loss. She will be going out of town and would like travel eating strategies.  Her weight is 230 lb (104.3 kg) today and has had a weight loss of 1 pounds over a period of 3 weeks since her last visit. She has lost 19 lbs since starting treatment with [name].  Pre-Diabetes Angie Velasquez has a diagnosis of pre-diabetes based on her elevated Hgb A1c and was informed this puts her at greater risk of developing diabetes. She is not on metformin, declines and continues to work on diet and exercise to decrease risk of diabetes. She denies polyphagia, nausea, or hypoglycemia.  ALLERGIES: Allergies  Allergen Reactions  . Epinephrine Palpitations  . Codeine Nausea Only  . Penicillins Nausea Only    MEDICATIONS: Current Outpatient Medications on File Prior to Visit  Medication Sig Dispense Refill  . acetaminophen (TYLENOL) 500 MG tablet Take 500 mg by mouth every 6 (six) hours as needed.    [name] allopurinol (ZYLOPRIM) 300 MG tablet TAKE 1 TABLET (300 MG TOTAL) ONCE A DAY 90 tablet 3  . aspirin 81 MG tablet Take 81 mg by mouth daily.    [name] buPROPion (WELLBUTRIN SR) 200 MG 12 hr tablet Take 1 tablet (200 mg total) by mouth daily at 12 noon. 30 tablet 0  . CALCIUM-MAGNESIUM-ZINC PO Take by mouth.    . clonazePAM (KLONOPIN) 0.5 MG tablet Take 1 tablet (0.5 mg total) by mouth at bedtime. 90 tablet 1  . diazepam (VALIUM) 10 MG tablet Take 10 mg by mouth every 6 (six) hours as needed (as needed for muscle spasms).    . diclofenac sodium (VOLTAREN) 1 % GEL Apply topically to affected area qid 100 g 2  . gabapentin (NEURONTIN) 300 MG capsule Take 1 capsule, orally, up to  TID or as directed 270 capsule 1  . lisinopril (PRINIVIL,ZESTRIL) 2.5 MG tablet Take 1 tablet (2.5 mg total) by mouth daily. 90 tablet 3  . nystatin cream (MYCOSTATIN) Apply 1 application topically 2 (two) times daily. To rash in groin for up to 2 weeks maximum 90 g 2  . OVER THE COUNTER MEDICATION Cherry extract bid    . OVER THE COUNTER MEDICATION B-12 one daily    . OVER THE COUNTER MEDICATION Vitamin D    . polyethylene glycol powder (GLYCOLAX/MIRALAX) powder Take 17 g by mouth daily. 3350 g 0  . polyethylene glycol powder (GLYCOLAX/MIRALAX) powder Take 17 g by mouth daily. 3350 g 0  . sertraline (ZOLOFT) 50 MG tablet TAKE 1 TABLET (50 MG TOTAL) BY MOUTH DAILY. 90 tablet 3  . simvastatin (ZOCOR) 20 MG tablet TAKE 1 TABLET AT BEDTIME 90 tablet 3  . Sodium Bicarbonate (NICE PURE BAKING SODA) POWD by Does not apply route.    . traMADol (ULTRAM) 50 MG tablet Take 1 tablet (50 mg total) by mouth every 6 (six) hours as needed for moderate pain or severe pain. 60 tablet 1  . triamcinolone cream (KENALOG) 0.1 % Apply 1 application topically 2 (two) times daily. For 7-10 days for recurring eczema issues 80 g 1   No current facility-administered medications on file prior to  visit.     PAST MEDICAL HISTORY: Past Medical History:  Diagnosis Date  . Anxiety    years ago - no longer an issue  . Arthritis    gout  . Cataracts, bilateral   . Complication of anesthesia   . Fracture of pelvis ([place])   . GERD (gastroesophageal reflux disease)   . Hx of chest pain    "anxiety"  . Hyperlipidemia   . INSOMNIA 10/14/2008   in past  . Joint pain   . Kidney problem   . Low blood sugar    eats several smaller meals a day  . Obesity   . Palpitations    Stress related   . Proteinuria    H/O  . Tobacco abuse    quit smoking 08/2013    PAST SURGICAL HISTORY: Past Surgical History:  Procedure Laterality Date  . ABDOMINAL HYSTERECTOMY    . APPENDECTOMY     with hysterectomy  . childbirth      x4  . CHOLECYSTECTOMY  2001  . EYE SURGERY Bilateral 2015   cataract surgery with lens implant  . NEPHRECTOMY  1992   complex cystic mass - nephrectomy right  . ORIF ANKLE FRACTURE Right 07/02/2014   Procedure: OPEN REDUCTION INTERNAL FIXATION (ORIF) RIGHT ANKLE BIMALLOELAR FRACTURE;  Surgeon: Wylene Simmer, MD;  Location: [facility];  Service: Orthopedics;  Laterality: Right;  . OTHER SURGICAL HISTORY     hysterectomy, R ovary remains  . SHOULDER SURGERY     bone spurs left    SOCIAL HISTORY: Social History   Tobacco Use  . Smoking status: Former Smoker    Packs/day: 1.00    Years: 35.00    Pack years: 35.00    Types: Cigarettes    Last attempt to quit: 09/01/2013    Years since quitting: 4.6  . Smokeless tobacco: Never Used  . Tobacco comment: Former smoker with no desire to smoke again. Smoking years edited at patients request to include periods when she did not smoke.  Substance Use Topics  . Alcohol use: Yes    Alcohol/week: 0.6 oz    Types: 1 Shots of liquor per week  . Drug use: No    FAMILY HISTORY: Family History  Problem Relation Age of Onset  . Stroke Father   . Alzheimer's disease Father        early, states also parkinsons. died 46  . Hypertension Father   . Obesity Father   . Ovarian cancer Mother        Dr. Jana Hakim   . Arthritis Mother   . Obesity Mother   . Colon cancer Paternal Grandmother   . Liver disease Daughter        On hospice age 80    ROS: Review of Systems  Constitutional: Positive for weight loss.  Gastrointestinal: Negative for nausea.  Endo/Heme/Allergies:       Negative polyphagia Negative hypoglycemia    PHYSICAL EXAM: Blood pressure 101/68, pulse 77, temperature 97.7 F (36.5 C), temperature source Oral, height 5\' 8"  (1.727 m), weight 230 lb (104.3 kg), SpO2 96 %. Body mass index is 34.97 kg/m. Physical Exam  Constitutional: She is oriented to person, place, and time. She appears well-developed and well-nourished.    Cardiovascular: Normal rate.  Pulmonary/Chest: Effort normal.  Musculoskeletal: Normal range of motion.  Neurological: She is oriented to person, place, and time.  Skin: Skin is warm and dry.  Psychiatric: She has a normal mood and affect. Her behavior is normal.  Vitals reviewed.   RECENT LABS AND TESTS: BMET    Component Value Date/Time   NA 138 09/18/2017 1049   K 4.2 09/18/2017 1049   CL 98 09/18/2017 1049   CO2 23 09/18/2017 1049   GLUCOSE 96 09/18/2017 1049   GLUCOSE 109 (H) 01/18/2017 0902   BUN 16 09/18/2017 1049   CREATININE 1.19 (H) 09/18/2017 1049   CALCIUM 9.5 09/18/2017 1049   GFRNONAA 46 (L) 09/18/2017 1049   GFRAA 53 (L) 09/18/2017 1049   Lab Results  Component Value Date   HGBA1C 5.7 (H) 09/18/2017   HGBA1C (H) 04/15/2010    5.9 (NOTE)                                                                       According to the ADA Clinical Practice Recommendations for 2011, when HbA1c is used as a screening test:   >=6.5%   Diagnostic of Diabetes Mellitus           (if abnormal result  is confirmed)  5.7-6.4%   Increased risk of developing Diabetes Mellitus  References:Diagnosis and Classification of Diabetes Mellitus,Diabetes VFIE,3329,51(OACZY 1):S62-S69 and Standards of Medical Care in         Diabetes - 2011,Diabetes Care,2011,34  (Suppl 1):S11-S61.   HGBA1C 5.8 12/31/2007   Lab Results  Component Value Date   INSULIN 25.3 (H) 09/18/2017   CBC    Component Value Date/Time   WBC 6.4 09/18/2017 1049   WBC 6.6 01/18/2017 0902   RBC 4.57 09/18/2017 1049   RBC 4.39 01/18/2017 0902   HGB 13.5 09/18/2017 1049   HCT 40.4 09/18/2017 1049   PLT 240.0 01/18/2017 0902   MCV 88 09/18/2017 1049   MCH 29.5 09/18/2017 1049   MCH 29.4 07/02/2014 1143   MCHC 33.4 09/18/2017 1049   MCHC 33.2 01/18/2017 0902   RDW 15.2 09/18/2017 1049   LYMPHSABS 2.6 09/18/2017 1049   MONOABS 0.9 04/15/2010 2135   EOSABS 0.2 09/18/2017 1049   BASOSABS 0.0 09/18/2017 1049    Iron/TIBC/Ferritin/ %Sat No results found for: IRON, TIBC, FERRITIN, IRONPCTSAT Lipid Panel     Component Value Date/Time   CHOL 161 09/18/2017 1049   TRIG 233 (H) 09/18/2017 1049   HDL 58 09/18/2017 1049   CHOLHDL 2 01/18/2017 0902   VLDL 28.2 01/18/2017 0902   LDLCALC 56 09/18/2017 1049   LDLDIRECT 77.0 01/18/2017 0902   Hepatic Function Panel     Component Value Date/Time   PROT 6.5 09/18/2017 1049   ALBUMIN 4.7 09/18/2017 1049   AST 35 09/18/2017 1049   ALT 42 (H) 09/18/2017 1049   ALKPHOS 92 09/18/2017 1049   BILITOT 0.3 09/18/2017 1049   BILIDIR <0.1 04/15/2010 2135   IBILI NOT CALCULATED 04/15/2010 2135      Component Value Date/Time   TSH 3.430 09/18/2017 1049   TSH 2.09 05/25/2015 0820   TSH 1.89 12/31/2007 0831    ASSESSMENT AND PLAN: Prediabetes  Class 2 severe obesity with serious comorbidity and body mass index (BMI) of 35.0 to 35.9 in adult, unspecified obesity type ([place])  PLAN:  Pre-Diabetes Angie Velasquez will continue to work on weight loss, diet, exercise, and decreasing simple carbohydrates in her diet to help decrease the risk  of diabetes. We dicussed metformin including benefits and risks. She was informed that eating too many simple carbohydrates or too many calories at one sitting increases the likelihood of GI side effects. Angie Velasquez declined metformin for now and a prescription was not written today. Angie Velasquez agrees to follow up with our clinic in 3 weeks as directed to monitor her progress.  We spent > than 50% of the 15 minute visit on the counseling as documented in the note.  Obesity Angie Velasquez is currently in the action stage of change. As such, her goal is to continue with weight loss efforts She has agreed to portion control better and make smarter food choices, such as increase vegetables and decrease simple carbohydrates  Angie Velasquez has been instructed to work up to a goal of 150 minutes of combined cardio and strengthening exercise per week for  weight loss and overall health benefits. We discussed the following Behavioral Modification Strategies today: travel eating strategies and planning for success    Angie Velasquez has agreed to follow up with our clinic in 3 weeks. She was informed of the importance of frequent follow up visits to maximize her success with intensive lifestyle modifications for her multiple health conditions.   OBESITY BEHAVIORAL INTERVENTION VISIT  Today's visit was # 12 out of 22.  Starting weight: 249 lbs Starting date: 09/18/17 Today's weight : 230 lbs Today's date: 05/09/2018 Total lbs lost to date: 99 (Patients must lose 7 lbs in the first 6 months to continue with counseling)   ASK: We discussed the diagnosis of obesity with Angie Velasquez today and Angie Velasquez agreed to give [name] permission to discuss obesity behavioral modification therapy today.  ASSESS: Angie Velasquez has the diagnosis of obesity and her BMI today is 34.98 Angie Velasquez is in the action stage of change   ADVISE: Angie Velasquez was educated on the multiple health risks of obesity as well as the benefit of weight loss to improve her health. She was advised of the need for long term treatment and the importance of lifestyle modifications.  AGREE: Multiple dietary modification options and treatment options were discussed and  Angie Velasquez agreed to the above obesity treatment plan.   Wilhemena Durie, am acting as transcriptionist for Lacy Duverney, PA-C I, Lacy Duverney [facility], have reviewed this note and agree with its content

## 2018-05-09 NOTE — Telephone Encounter (Signed)
Copied from [place] [id number]. Topic: Quick Communication - See Telephone Encounter >> May 09, 2018  1:50 PM Vernona Rieger wrote: CRM for notification. See Telephone encounter for: 05/09/18.  clonazePAM (KLONOPIN) 0.5 MG tablet [[id number]]  DISCONTINUED   Husband said that he has been giving her his since she is currently out.   [facility] [address]

## 2018-05-10 ENCOUNTER — Encounter: Payer: Self-pay | Admitting: Family Medicine

## 2018-05-10 NOTE — Telephone Encounter (Signed)
See note

## 2018-05-10 NOTE — Telephone Encounter (Signed)
Pt is out of meds and will be going out of town  Needs called into [facility] and [facility]

## 2018-05-11 ENCOUNTER — Encounter: Payer: Self-pay | Admitting: Family Medicine

## 2018-05-30 ENCOUNTER — Ambulatory Visit (INDEPENDENT_AMBULATORY_CARE_PROVIDER_SITE_OTHER): Payer: Medicare HMO | Admitting: Physician Assistant

## 2018-05-30 VITALS — BP 90/61 | HR 92 | Temp 98.1°F | Ht 68.0 in | Wt 230.0 lb

## 2018-05-30 DIAGNOSIS — F3289 Other specified depressive episodes: Secondary | ICD-10-CM | POA: Diagnosis not present

## 2018-05-30 DIAGNOSIS — R7303 Prediabetes: Secondary | ICD-10-CM

## 2018-05-30 DIAGNOSIS — Z9189 Other specified personal risk factors, not elsewhere classified: Secondary | ICD-10-CM | POA: Diagnosis not present

## 2018-05-30 DIAGNOSIS — Z6835 Body mass index (BMI) 35.0-35.9, adult: Secondary | ICD-10-CM | POA: Diagnosis not present

## 2018-05-30 MED ORDER — BUPROPION HCL ER (SR) 200 MG PO TB12: 200.0000 mg | ORAL_TABLET | Freq: Every day | ORAL | 0 refills | Status: DC

## 2018-05-30 NOTE — Progress Notes (Signed)
Office: [phone]  /  Fax: [phone]   HPI:   Chief Complaint: OBESITY Angie Velasquez is here to discuss her progress with her obesity treatment plan. She is on the portion control better and make smarter food choices plan and is following her eating plan approximately 50 % of the time. She states she is walking the dog and stretching for 30 minutes 3 times per week. Earnie maintained her weight. She was out of town and tried to make better food choices. She requests a longer follow up time as she will be traveling out of town. Her weight is 230 lb (104.3 kg) today and has maintained weight over a period of 3 weeks since her last visit. She has lost 19 lbs since starting treatment with [name].  Pre-Diabetes Angie Velasquez has a diagnosis of prediabetes based on her elevated Hgb A1c and was informed this puts her at greater risk of developing diabetes. She is not taking metformin currently and continues to work on diet and exercise to decrease risk of diabetes. She denies nausea, polyphagia or hypoglycemia.  At risk for diabetes Angie Velasquez is at higher than average risk for developing diabetes due to her obesity. She currently denies polyuria or polydipsia.  Depression with emotional eating behaviors Angie Velasquez is struggling with emotional eating and using food for comfort to the extent that it is negatively impacting her health. She often snacks when she is not hungry. [name] sometimes feels she is out of control and then feels guilty that she made poor food choices. She has been working on behavior modification techniques to help reduce her emotional eating and has been somewhat successful. Her mood is stable and she shows no sign of suicidal or homicidal ideations.  Depression screen Angie [facility]as[facility] 2/9 01/10/2018 09/18/2017 09/05/2017 03/26/2017 03/26/2017  Decreased Interest 1 1 2  0 0  Down, Depressed, Hopeless 2 1 2 1  0  PHQ - 2 Score 3 2 4 1  0  Altered sleeping 0 0 0 - -  Tired, decreased energy 0 1 1 - -  Change  in appetite 0 1 0 - -  Feeling bad or failure about yourself  0 1 0 - -  Trouble concentrating 0 1 0 - -  Moving slowly or fidgety/restless 0 0 0 - -  Suicidal thoughts 0 1 0 - -  PHQ-9 Score 3 7 5  - -  Difficult doing work/chores Somewhat difficult Not difficult at all Somewhat difficult - -      ALLERGIES: Allergies  Allergen Reactions  . Epinephrine Palpitations  . Codeine Nausea Only  . Penicillins Nausea Only    MEDICATIONS: Current Outpatient Medications on File Prior to Visit  Medication Sig Dispense Refill  . acetaminophen (TYLENOL) 500 MG tablet Take 500 mg by mouth every 6 (six) hours as needed.    [name] allopurinol (ZYLOPRIM) 300 MG tablet TAKE 1 TABLET (300 MG TOTAL) ONCE A DAY 90 tablet 3  . aspirin 81 MG tablet Take 81 mg by mouth daily.    [name] CALCIUM-MAGNESIUM-ZINC PO Take by mouth.    . clonazePAM (KLONOPIN) 0.5 MG tablet Take 1 tablet (0.5 mg total) by mouth at bedtime. 90 tablet 1  . diazepam (VALIUM) 10 MG tablet Take 10 mg by mouth every 6 (six) hours as needed (as needed for muscle spasms).    . diclofenac sodium (VOLTAREN) 1 % GEL Apply topically to affected area qid 100 g 2  . gabapentin (NEURONTIN) 300 MG capsule Take 1 capsule, orally, up to TID or as  directed 270 capsule 1  . lisinopril (PRINIVIL,ZESTRIL) 2.5 MG tablet Take 1 tablet (2.5 mg total) by mouth daily. 90 tablet 3  . nystatin cream (MYCOSTATIN) Apply 1 application topically 2 (two) times daily. To rash in groin for up to 2 weeks maximum 90 g 2  . OVER THE COUNTER MEDICATION Cherry extract bid    . OVER THE COUNTER MEDICATION B-12 one daily    . OVER THE COUNTER MEDICATION Vitamin D    . polyethylene glycol powder (GLYCOLAX/MIRALAX) powder Take 17 g by mouth daily. 3350 g 0  . polyethylene glycol powder (GLYCOLAX/MIRALAX) powder Take 17 g by mouth daily. 3350 g 0  . sertraline (ZOLOFT) 50 MG tablet TAKE 1 TABLET (50 MG TOTAL) BY MOUTH DAILY. 90 tablet 3  . simvastatin (ZOCOR) 20 MG tablet TAKE 1  TABLET AT BEDTIME 90 tablet 3  . Sodium Bicarbonate (NICE PURE BAKING SODA) POWD by Does not apply route.    . traMADol (ULTRAM) 50 MG tablet Take 1 tablet (50 mg total) by mouth every 6 (six) hours as needed for moderate pain or severe pain. 60 tablet 1  . triamcinolone cream (KENALOG) 0.1 % Apply 1 application topically 2 (two) times daily. For 7-10 days for recurring eczema issues 80 g 1   No current facility-administered medications on file prior to visit.     PAST MEDICAL HISTORY: Past Medical History:  Diagnosis Date  . Anxiety    years ago - no longer an issue  . Arthritis    gout  . Cataracts, bilateral   . Complication of anesthesia   . Fracture of pelvis ([place])   . GERD (gastroesophageal reflux disease)   . Hx of chest pain    "anxiety"  . Hyperlipidemia   . INSOMNIA 10/14/2008   in past  . Joint pain   . Kidney problem   . Low blood sugar    eats several smaller meals a day  . Obesity   . Palpitations    Stress related   . Proteinuria    H/O  . Tobacco abuse    quit smoking 08/2013    PAST SURGICAL HISTORY: Past Surgical History:  Procedure Laterality Date  . ABDOMINAL HYSTERECTOMY    . APPENDECTOMY     with hysterectomy  . childbirth     x4  . CHOLECYSTECTOMY  2001  . EYE SURGERY Bilateral 2015   cataract surgery with lens implant  . NEPHRECTOMY  1992   complex cystic mass - nephrectomy right  . ORIF ANKLE FRACTURE Right 07/02/2014   Procedure: OPEN REDUCTION INTERNAL FIXATION (ORIF) RIGHT ANKLE BIMALLOELAR FRACTURE;  Surgeon: Wylene Simmer, MD;  Location: [place];  Service: Orthopedics;  Laterality: Right;  . OTHER SURGICAL HISTORY     hysterectomy, R ovary remains  . SHOULDER SURGERY     bone spurs left    SOCIAL HISTORY: Social History   Tobacco Use  . Smoking status: Former Smoker    Packs/day: 1.00    Years: 35.00    Pack years: 35.00    Types: Cigarettes    Last attempt to quit: 09/01/2013    Years since quitting: 4.7  . Smokeless  tobacco: Never Used  . Tobacco comment: Former smoker with no desire to smoke again. Smoking years edited at patients request to include periods when she did not smoke.  Substance Use Topics  . Alcohol use: Yes    Alcohol/week: 0.6 oz    Types: 1 Shots of liquor per week  .  Drug use: No    FAMILY HISTORY: Family History  Problem Relation Age of Onset  . Stroke Father   . Alzheimer's disease Father        early, states also parkinsons. died 7  . Hypertension Father   . Obesity Father   . Ovarian cancer Mother        Dr. Jana Hakim   . Arthritis Mother   . Obesity Mother   . Colon cancer Paternal Grandmother   . Liver disease Daughter        On hospice age 5    ROS: Review of Systems  Constitutional: Negative for weight loss.  Gastrointestinal: Negative for nausea.  Endo/Heme/Allergies:       Negative for polyphagia Negative for hypoglycemia  Psychiatric/Behavioral: Positive for depression. Negative for suicidal ideas.    PHYSICAL EXAM: Blood pressure 90/61, pulse 92, temperature 98.1 F (36.7 C), temperature source Oral, height 5\' 8"  (1.727 m), weight 230 lb (104.3 kg), SpO2 94 %. Body mass index is 34.97 kg/m. Physical Exam  Constitutional: She is oriented to person, place, and time. She appears well-developed and well-nourished.  Cardiovascular: Normal rate.  Pulmonary/Chest: Effort normal.  Musculoskeletal: Normal range of motion.  Neurological: She is oriented to person, place, and time.  Skin: Skin is warm and dry.  Psychiatric: She has a normal mood and affect. Her behavior is normal.    RECENT LABS AND TESTS: BMET    Component Value Date/Time   NA 138 09/18/2017 1049   K 4.2 09/18/2017 1049   CL 98 09/18/2017 1049   CO2 23 09/18/2017 1049   GLUCOSE 96 09/18/2017 1049   GLUCOSE 109 (H) 01/18/2017 0902   BUN 16 09/18/2017 1049   CREATININE 1.19 (H) 09/18/2017 1049   CALCIUM 9.5 09/18/2017 1049   GFRNONAA 46 (L) 09/18/2017 1049   GFRAA 53 (L)  09/18/2017 1049   Lab Results  Component Value Date   HGBA1C 5.7 (H) 09/18/2017   HGBA1C (H) 04/15/2010    5.9 (NOTE)                                                                       According to the ADA Clinical Practice Recommendations for 2011, when HbA1c is used as a screening test:   >=6.5%   Diagnostic of Diabetes Mellitus           (if abnormal result  is confirmed)  5.7-6.4%   Increased risk of developing Diabetes Mellitus  References:Diagnosis and Classification of Diabetes Mellitus,Diabetes FWYO,3785,88(FOYDX 1):S62-S69 and Standards of Medical Care in         Diabetes - 2011,Diabetes Care,2011,34  (Suppl 1):S11-S61.   HGBA1C 5.8 12/31/2007   Lab Results  Component Value Date   INSULIN 25.3 (H) 09/18/2017   CBC    Component Value Date/Time   WBC 6.4 09/18/2017 1049   WBC 6.6 01/18/2017 0902   RBC 4.57 09/18/2017 1049   RBC 4.39 01/18/2017 0902   HGB 13.5 09/18/2017 1049   HCT 40.4 09/18/2017 1049   PLT 240.0 01/18/2017 0902   MCV 88 09/18/2017 1049   MCH 29.5 09/18/2017 1049   MCH 29.4 07/02/2014 1143   MCHC 33.4 09/18/2017 1049   MCHC 33.2 01/18/2017 0902   RDW  15.2 09/18/2017 1049   LYMPHSABS 2.6 09/18/2017 1049   MONOABS 0.9 04/15/2010 2135   EOSABS 0.2 09/18/2017 1049   BASOSABS 0.0 09/18/2017 1049   Iron/TIBC/Ferritin/ %Sat No results found for: IRON, TIBC, FERRITIN, IRONPCTSAT Lipid Panel     Component Value Date/Time   CHOL 161 09/18/2017 1049   TRIG 233 (H) 09/18/2017 1049   HDL 58 09/18/2017 1049   CHOLHDL 2 01/18/2017 0902   VLDL 28.2 01/18/2017 0902   LDLCALC 56 09/18/2017 1049   LDLDIRECT 77.0 01/18/2017 0902   Hepatic Function Panel     Component Value Date/Time   PROT 6.5 09/18/2017 1049   ALBUMIN 4.7 09/18/2017 1049   AST 35 09/18/2017 1049   ALT 42 (H) 09/18/2017 1049   ALKPHOS 92 09/18/2017 1049   BILITOT 0.3 09/18/2017 1049   BILIDIR <0.1 04/15/2010 2135   IBILI NOT CALCULATED 04/15/2010 2135      Component Value  Date/Time   TSH 3.430 09/18/2017 1049   TSH 2.09 05/25/2015 0820   TSH 1.89 12/31/2007 0831   Results for Judie Bonus ELIZABETH "[name]" (MRN [id number]) as of 05/30/2018 14:26  Ref. Range 09/18/2017 10:49  Vitamin D, 25-Hydroxy Latest Ref Range: 30.0 - 100.0 ng/mL 45.6   ASSESSMENT AND PLAN: Prediabetes  Other depression - with emotional eating - Plan: buPROPion (WELLBUTRIN SR) 200 MG 12 hr tablet  At risk for diabetes mellitus  Class 2 severe obesity with serious comorbidity and body mass index (BMI) of 35.0 to 35.9 in adult, unspecified obesity type [facility])  PLAN:  Pre-Diabetes Sharayah will continue to work on weight loss, exercise, and decreasing simple carbohydrates in her diet to help decrease the risk of diabetes. She was informed that eating too many simple carbohydrates or too many calories at one sitting increases the likelihood of GI side effects. Loisann agreed to follow up with [name] as directed to monitor her progress.  Diabetes risk counseling Beata was given extended (15 minutes) diabetes prevention counseling today. She is 72 y.o. female and has risk factors for diabetes including obesity and pre-diabetes. We discussed intensive lifestyle modifications today with an emphasis on weight loss as well as increasing exercise and decreasing simple carbohydrates in her diet.  Depression with Emotional Eating Behaviors We discussed behavior modification techniques today to help Imogine deal with her emotional eating and depression. She has agreed to continue Wellbutrin SR 200 mg qd #30 with no refills and follow up as directed.  Obesity Babygirl is currently in the action stage of change. As such, her goal is to continue with weight loss efforts She has agreed to portion control better and make smarter food choices, such as increase vegetables and decrease simple carbohydrates  Kimisha has been instructed to work up to a goal of 150 minutes of combined cardio and strengthening  exercise per week for weight loss and overall health benefits. We discussed the following Behavioral Modification Strategies today: increasing lean protein intake and work on meal planning and easy cooking plans  Jurnee has agreed to follow up with our clinic in 2 weeks. She was informed of the importance of frequent follow up visits to maximize her success with intensive lifestyle modifications for her multiple health conditions.   OBESITY BEHAVIORAL INTERVENTION VISIT  Today's visit was # 13 out of 22.  Starting weight: 249 lbs Starting date: 09/18/17 Today's weight : 230 lbs  Today's date: 05/30/2018 Total lbs lost to date: 57 (Patients must lose 7 lbs in the first 6 months to continue  with counseling)   ASK: We discussed the diagnosis of obesity with [name] today and Navea agreed to give [name] permission to discuss obesity behavioral modification therapy today.  ASSESS: Desia has the diagnosis of obesity and her BMI today is 34.98 Iza is in the action stage of change   ADVISE: Katia was educated on the multiple health risks of obesity as well as the benefit of weight loss to improve her health. She was advised of the need for long term treatment and the importance of lifestyle modifications.  AGREE: Multiple dietary modification options and treatment options were discussed and  Marcy agreed to the above obesity treatment plan.   Corey Skains, am acting as transcriptionist for [employer], PA-C I, L[facility], have reviewed this note and agree with its content

## 2018-05-30 NOTE — Progress Notes (Signed)
90/68 

## 2018-06-12 ENCOUNTER — Other Ambulatory Visit: Payer: Self-pay | Admitting: Family Medicine

## 2018-06-13 ENCOUNTER — Telehealth: Payer: Self-pay

## 2018-06-13 ENCOUNTER — Telehealth: Payer: Self-pay | Admitting: Family Medicine

## 2018-06-13 MED ORDER — CLONAZEPAM 0.5 MG PO TABS: 0.5000 mg | ORAL_TABLET | Freq: Every evening | ORAL | 0 refills | Status: DC | PRN

## 2018-06-13 NOTE — Telephone Encounter (Signed)
Patient needs 8 day refill of klonopin for travel

## 2018-06-13 NOTE — Telephone Encounter (Signed)
Patient cam e in with her husband at his appointment requesting a refill on her Klonopin and needs a parking sticker. Patient states they are going out of town next week. I printed out a copy of her PMP for Dr. Yong Channel to see if he would refill the klonopin.

## 2018-06-24 ENCOUNTER — Ambulatory Visit (INDEPENDENT_AMBULATORY_CARE_PROVIDER_SITE_OTHER): Payer: Medicare HMO | Admitting: Family Medicine

## 2018-06-25 ENCOUNTER — Ambulatory Visit: Payer: Self-pay

## 2018-06-25 ENCOUNTER — Ambulatory Visit: Payer: Medicare HMO | Admitting: Sports Medicine

## 2018-06-25 ENCOUNTER — Encounter: Payer: Self-pay | Admitting: Sports Medicine

## 2018-06-25 VITALS — BP 104/72 | HR 79 | Ht 68.0 in

## 2018-06-25 DIAGNOSIS — M8530 Osteitis condensans, unspecified site: Secondary | ICD-10-CM | POA: Insufficient documentation

## 2018-06-25 MED ORDER — TRAMADOL HCL 50 MG PO TABS: 50.0000 mg | ORAL_TABLET | Freq: Four times a day (QID) | ORAL | 1 refills | Status: DC | PRN

## 2018-06-25 NOTE — Patient Instructions (Signed)

## 2018-06-25 NOTE — Procedures (Addendum)
PROCEDURE NOTE:  Ultrasound Guided: Injection: Pubic symphysis Images were obtained and interpreted by myself, Teresa Coombs, DO  Images have been saved and stored to PACS system. Images obtained on: GE S7 Ultrasound machine    ULTRASOUND FINDINGS:  Marked mushroom sign of the pubic symphysis  DESCRIPTION OF PROCEDURE:  The patient's clinical condition is marked by substantial pain and/or significant functional disability. Other conservative therapy has not provided relief, is contraindicated, or not appropriate. There is a reasonable likelihood that injection will significantly improve the patient's pain and/or functional impairment.   After discussing the risks, benefits and expected outcomes of the injection and all questions were reviewed and answered, the patient wished to undergo the above named procedure.  Verbal consent was obtained.  The ultrasound was used to identify the target structure and adjacent neurovascular structures. The skin was then prepped in sterile fashion and the target structure was injected under direct visualization using sterile technique as below:  PREP: Alcohol and Ethel Chloride APPROACH: direct, stopcock technique, 22g 3.5 in. INJECTATE: 3 cc 1% lidocaine, 1 cc 0.5% Marcaine and 1 cc 40mg /mL DepoMedrol ASPIRATE: None DRESSING: Band-Aid  Post procedural instructions including recommending icing and warning signs for infection were reviewed.    This procedure was well tolerated and there were no complications.   IMPRESSION: Succesful Ultrasound Guided: Injection

## 2018-06-25 NOTE — Progress Notes (Signed)
[name]. Emmanuelle Coxe, [place] at [place]  Lashawne Dura - 72 y.o. female MRN [id number]  Date of birth: 06/08/1946  Visit Date: 06/25/2018  PCP: Marin Olp, MD   Referred by: Marin Olp, MD  Scribe(s) for today's visit: Josepha Pigg, CMA  SUBJECTIVE:  [name] "[name]" is here for Follow-up (pelvic pain)   02/11/2018: Her left arm pain symptoms INITIALLY: Began about 10 days ago and MOI is unknown.  Described as constant mild pain but can be moderate-severe with use. Pain is described as aching, nonradiating Worsened with lying on L side Improved with pillow under R arm. She did notice some improvement after receiving a massage in [place].  Additional associated symptoms include: hx of bone spurs. She is able to raise her arm overhead with no pain. Pain is mostly posterior. She has noticed increased pain on the L side of her neck.    At this time symptoms show no change compared to onset  She has been taking tylenol and tramadol which helps to take the edge off.   03/13/2018: Compared to the last office visit, her previously described symptoms are worsening. She traveled recently and had to walk a lot around the airport carrying her purse and carry on. She feels that this has contributed to her increased pelvic pain.  Current symptoms are severe & are nonradiating She has been trying to rest. She stayed off of her feet yesterday and that help. She has tried icing the area with some relief. She has bene taking Gabapentin 300 mg BID. She has also been taking tramadol and tylenol prn, up to twice daily, this just takes the edge off.  She will be moving out soon d/t recent split up with her husband.  Compared to the last office visit, her previously described symptoms of left-sided neck and arm pain show no change. Current symptoms are mild & are radiating to L arm. The more she uses the L arm the  worse the pain gets. Pain was worse after 700 mile road trip.  She has been icing her neck/arm, taking Gabapentin, tramadol, and tylenol.   03/15/2018: Compared to the last office visit, her previously described pelvic pain symptoms are worsening.  She states that she is in the process of moving and just had a very bad day yesterday.  She notes having taken 2 hydrocodone yesterday in order to try to sleep last night. She is currently walking w/ a walker. Current symptoms are severe (20/10) & are radiating to R groin from the symphysis pubis. She has been taking Gabapentin 300mg  bid, Tramadol and Tylenol.  She also took 2 hydrocodone yesterday.  She is currently walking w/ a walker.  06/25/2018: Compared to the last office visit, her previously described symptoms are worsening. Pain started to flare up about 3 weeks ago.  Current symptoms are mild & are radiating to the groin. At times she feels like the pain "grabs her".  She has been taking Gabapentin 300 mg BID, Tramadol, and Tylenol with some relief.  She just got back from traveling recently, he did walk with a walker while at the airport. She is not ambulating with her walker today.    REVIEW OF SYSTEMS: Reports night time disturbances. Denies fevers, chills, or night sweats. Denies unexplained weight loss. Denies personal history of cancer. Denies changes in bowel or bladder habits. Denies recent unreported falls. Denies new or worsening dyspnea or wheezing.  Denies headaches or dizziness.  Denies numbness, tingling or weakness  In the extremities.  Denies dizziness or presyncopal episodes Denies lower extremity edema    HISTORY & PERTINENT PRIOR DATA:  Prior History reviewed and updated per electronic medical record.  Significant/pertinent history, findings, studies include:  reports that she quit smoking about 4 years ago. Her smoking use included cigarettes. She has a 35.00 pack-year smoking history. She has never used smokeless  tobacco. Recent Labs    09/18/17 1049  HGBA1C 5.7*   No specialty comments available. Problem  Osteitis Condensans    OBJECTIVE:  VS:  HT:5\' 8"  (172.7 cm)   WT:(declined)  BMI:     BP:104/72  HR:79bpm  TEMP: ( )  RESP:96 %   PHYSICAL EXAM: Constitutional: WDWN, Non-toxic appearing. Psychiatric: Alert & appropriately interactive.  Not depressed or anxious appearing. Respiratory: No increased work of breathing.  Trachea Midline Eyes: Pupils are equal.  EOM intact without nystagmus.  No scleral icterus  Vascular Exam: warm to touch no edema  lower extremity neuro exam: unremarkable  MSK Exam: Marked TTP directly over the pubic symphysis.  She has minimal pain with logroll and Corky Sox testing.   ASSESSMENT & PLAN:   1. Osteitis condensans     PLAN: Repeat ultrasound-guided injection of the pubic symphysis.  Slight needle tenotomy performed of the osteophytic spurring is in this area at the calcific changes appreciated.  She tolerated this well and good improvement in her symptoms on this immediately.  We discussed that it is okay to continue repeating these injections intermittently as needed but I like to try to space her out as long as possible and doing an injection every 3 to 4 months is likely too often but can be considered with the understanding that there is weakening of the abdominal aponeurosis.  Continue working on weight loss and core conditioning.  Follow-up: Return if symptoms worsen or fail to improve.     Please see additional documentation for Objective, Assessment and Plan sections. Pertinent additional documentation may be included in corresponding procedure notes, imaging studies, problem based documentation and patient instructions. Please see these sections of the encounter for additional information regarding this visit.  CMA/ATC served as Education administrator during this visit. History, Physical, and Plan performed by medical provider. Documentation and orders  reviewed and attested to.      Gerda Diss, [place] [facility] Physician

## 2018-07-01 ENCOUNTER — Ambulatory Visit (INDEPENDENT_AMBULATORY_CARE_PROVIDER_SITE_OTHER): Payer: Medicare HMO | Admitting: Family Medicine

## 2018-07-01 VITALS — BP 102/67 | HR 67 | Temp 97.7°F | Ht 68.0 in | Wt 235.0 lb

## 2018-07-01 DIAGNOSIS — R7303 Prediabetes: Secondary | ICD-10-CM

## 2018-07-01 DIAGNOSIS — Z6835 Body mass index (BMI) 35.0-35.9, adult: Secondary | ICD-10-CM | POA: Diagnosis not present

## 2018-07-01 DIAGNOSIS — F3289 Other specified depressive episodes: Secondary | ICD-10-CM

## 2018-07-02 NOTE — Progress Notes (Signed)
Office: [phone]  /  Fax: [phone]   HPI:   Chief Complaint: OBESITY Angie Velasquez is here to discuss her progress with her obesity treatment plan. She is on the portion control better and make smarter food choices, such as increase vegetables and decrease simple carbohydrates and is following her eating plan approximately 60 % of the time. She states she is exercising 0 minutes 0 times per week. Angie Velasquez just returned from a 3 weeks long vacation. Wants to get remotivated. Lots of significant marital issues. Planning to go to [facility] and [place] next week.  Her weight is 235 lb (106.6 kg) today and has gained 5 pounds since her last visit. She has lost 14 lbs since starting treatment with [name].  Pre-Diabetes Angie Velasquez has a diagnosis of pre-diabetes based on her elevated Hgb A1c and was informed this puts her at greater risk of developing diabetes. She notes carbohydrate cravings. No recent labs. She is not taking metformin currently and continues to work on diet and exercise to decrease risk of diabetes. She denies nausea or hypoglycemia.  Depression with emotional eating behaviors Angie Velasquez feels cravings are well controlled with Wellbutrin but she is not taking consistently. Angie Velasquez struggles with emotional eating and using food for comfort to the extent that it is negatively impacting her health. She often snacks when she is not hungry. Angie Velasquez sometimes feels she is out of control and then feels guilty that she made poor food choices. She has been working on behavior modification techniques to help reduce her emotional eating and has been somewhat successful. She shows no sign of suicidal or homicidal ideations.  Depression screen [facility] 2/9 01/10/2018 09/18/2017 09/05/2017 03/26/2017 03/26/2017  Decreased Interest 1 1 2  0 0  Down, Depressed, Hopeless 2 1 2 1  0  PHQ - 2 Score 3 2 4 1  0  Altered sleeping 0 0 0 - -  Tired, decreased energy 0 1 1 - -  Change in appetite 0 1 0 - -    Feeling bad or failure about yourself  0 1 0 - -  Trouble concentrating 0 1 0 - -  Moving slowly or fidgety/restless 0 0 0 - -  Suicidal thoughts 0 1 0 - -  PHQ-9 Score 3 7 5  - -  Difficult doing work/chores Somewhat difficult Not difficult at all Somewhat difficult - -    ALLERGIES: Allergies  Allergen Reactions  . Epinephrine Palpitations  . Codeine Nausea Only  . Penicillins Nausea Only    MEDICATIONS: Current Outpatient Medications on File Prior to Visit  Medication Sig Dispense Refill  . acetaminophen (TYLENOL) 500 MG tablet Take 500 mg by mouth every 6 (six) hours as needed.    [name] allopurinol (ZYLOPRIM) 300 MG tablet TAKE 1 TABLET (300 MG TOTAL) ONCE A DAY 90 tablet 3  . aspirin 81 MG tablet Take 81 mg by mouth daily.    [name] buPROPion (WELLBUTRIN SR) 200 MG 12 hr tablet Take 1 tablet (200 mg total) by mouth daily at 12 noon. 30 tablet 0  . CALCIUM-MAGNESIUM-ZINC PO Take by mouth.    . clonazePAM (KLONOPIN) 0.5 MG tablet Take 1 tablet (0.5 mg total) by mouth at bedtime. 90 tablet 1  . clonazePAM (KLONOPIN) 0.5 MG tablet Take 1 tablet (0.5 mg total) by mouth at bedtime as needed (sleep. 8 day refill while traveling and awaiting mail order refill.). 8 tablet 0  . diazepam (VALIUM) 10 MG tablet Take 10 mg by mouth every 6 (six) hours as  needed (as needed for muscle spasms).    . diclofenac sodium (VOLTAREN) 1 % GEL Apply topically to affected area qid 100 g 2  . gabapentin (NEURONTIN) 300 MG capsule Take 1 capsule, orally, up to TID or as directed 270 capsule 1  . lisinopril (PRINIVIL,ZESTRIL) 2.5 MG tablet Take 1 tablet (2.5 mg total) by mouth daily. 90 tablet 3  . nystatin cream (MYCOSTATIN) Apply 1 application topically 2 (two) times daily. To rash in groin for up to 2 weeks maximum 90 g 2  . OVER THE COUNTER MEDICATION Cherry extract bid    . OVER THE COUNTER MEDICATION B-12 one daily    . OVER THE COUNTER MEDICATION Vitamin D    . polyethylene glycol powder (GLYCOLAX/MIRALAX)  powder Take 17 g by mouth daily. 3350 g 0  . polyethylene glycol powder (GLYCOLAX/MIRALAX) powder Take 17 g by mouth daily. 3350 g 0  . sertraline (ZOLOFT) 50 MG tablet TAKE 1 TABLET (50 MG TOTAL) BY MOUTH DAILY. 90 tablet 3  . simvastatin (ZOCOR) 20 MG tablet TAKE 1 TABLET AT BEDTIME 90 tablet 0  . Sodium Bicarbonate (NICE PURE BAKING SODA) POWD by Does not apply route.    . traMADol (ULTRAM) 50 MG tablet Take 1 tablet (50 mg total) by mouth every 6 (six) hours as needed for moderate pain or severe pain. 60 tablet 1  . triamcinolone cream (KENALOG) 0.1 % Apply 1 application topically 2 (two) times daily. For 7-10 days for recurring eczema issues 80 g 1   No current facility-administered medications on file prior to visit.     PAST MEDICAL HISTORY: Past Medical History:  Diagnosis Date  . Anxiety    years ago - no longer an issue  . Arthritis    gout  . Cataracts, bilateral   . Complication of anesthesia   . Fracture of pelvis ([place])   . GERD (gastroesophageal reflux disease)   . Hx of chest pain    "anxiety"  . Hyperlipidemia   . INSOMNIA 10/14/2008   in past  . Joint pain   . Kidney problem   . Low blood sugar    eats several smaller meals a day  . Obesity   . Palpitations    Stress related   . Proteinuria    H/O  . Tobacco abuse    quit smoking 08/2013    PAST SURGICAL HISTORY: Past Surgical History:  Procedure Laterality Date  . ABDOMINAL HYSTERECTOMY    . APPENDECTOMY     with hysterectomy  . childbirth     x4  . CHOLECYSTECTOMY  2001  . EYE SURGERY Bilateral 2015   cataract surgery with lens implant  . NEPHRECTOMY  1992   complex cystic mass - nephrectomy right  . ORIF ANKLE FRACTURE Right 07/02/2014   Procedure: OPEN REDUCTION INTERNAL FIXATION (ORIF) RIGHT ANKLE BIMALLOELAR FRACTURE;  Surgeon: Wylene Simmer, MD;  Location: [facility];  Service: Orthopedics;  Laterality: Right;  . OTHER SURGICAL HISTORY     hysterectomy, R ovary remains  . SHOULDER SURGERY      bone spurs left    SOCIAL HISTORY: Social History   Tobacco Use  . Smoking status: Former Smoker    Packs/day: 1.00    Years: 35.00    Pack years: 35.00    Types: Cigarettes    Last attempt to quit: 09/01/2013    Years since quitting: 4.8  . Smokeless tobacco: Never Used  . Tobacco comment: Former smoker with no desire to  smoke again. Smoking years edited at patients request to include periods when she did not smoke.  Substance Use Topics  . Alcohol use: Yes    Alcohol/week: 0.6 oz    Types: 1 Shots of liquor per week  . Drug use: No    FAMILY HISTORY: Family History  Problem Relation Age of Onset  . Stroke Father   . Alzheimer's disease Father        early, states also parkinsons. died 55  . Hypertension Father   . Obesity Father   . Ovarian cancer Mother        Dr. Jana Hakim   . Arthritis Mother   . Obesity Mother   . Colon cancer Paternal Grandmother   . Liver disease Daughter        On hospice age 39    ROS: Review of Systems  Constitutional: Negative for weight loss.  Gastrointestinal: Negative for nausea.  Endo/Heme/Allergies:       Negative hypoglycemia  Psychiatric/Behavioral: Positive for depression. Negative for suicidal ideas.    PHYSICAL EXAM: Blood pressure 102/67, pulse 67, temperature 97.7 F (36.5 C), temperature source Oral, height 5\' 8"  (1.727 m), weight 235 lb (106.6 kg), SpO2 98 %. Body mass index is 35.73 kg/m. Physical Exam  Constitutional: She is oriented to person, place, and time. She appears well-developed and well-nourished.  Cardiovascular: Normal rate.  Pulmonary/Chest: Effort normal.  Musculoskeletal: Normal range of motion.  Neurological: She is oriented to person, place, and time.  Skin: Skin is warm and dry.  Psychiatric: She has a normal mood and affect. Her behavior is normal.  Vitals reviewed.   RECENT LABS AND TESTS: BMET    Component Value Date/Time   NA 138 09/18/2017 1049   K 4.2 09/18/2017 1049   CL 98  09/18/2017 1049   CO2 23 09/18/2017 1049   GLUCOSE 96 09/18/2017 1049   GLUCOSE 109 (H) 01/18/2017 0902   BUN 16 09/18/2017 1049   CREATININE 1.19 (H) 09/18/2017 1049   CALCIUM 9.5 09/18/2017 1049   GFRNONAA 46 (L) 09/18/2017 1049   GFRAA 53 (L) 09/18/2017 1049   Lab Results  Component Value Date   HGBA1C 5.7 (H) 09/18/2017   HGBA1C (H) 04/15/2010    5.9 (NOTE)                                                                       According to the ADA Clinical Practice Recommendations for 2011, when HbA1c is used as a screening test:   >=6.5%   Diagnostic of Diabetes Mellitus           (if abnormal result  is confirmed)  5.7-6.4%   Increased risk of developing Diabetes Mellitus  References:Diagnosis and Classification of Diabetes Mellitus,Diabetes GNFA,2130,86(VHQIO 1):S62-S69 and Standards of Medical Care in         Diabetes - 2011,Diabetes Care,2011,34  (Suppl 1):S11-S61.   HGBA1C 5.8 12/31/2007   Lab Results  Component Value Date   INSULIN 25.3 (H) 09/18/2017   CBC    Component Value Date/Time   WBC 6.4 09/18/2017 1049   WBC 6.6 01/18/2017 0902   RBC 4.57 09/18/2017 1049   RBC 4.39 01/18/2017 0902   HGB 13.5 09/18/2017 1049   HCT 40.4  09/18/2017 1049   PLT 240.0 01/18/2017 0902   MCV 88 09/18/2017 1049   MCH 29.5 09/18/2017 1049   MCH 29.4 07/02/2014 1143   MCHC 33.4 09/18/2017 1049   MCHC 33.2 01/18/2017 0902   RDW 15.2 09/18/2017 1049   LYMPHSABS 2.6 09/18/2017 1049   MONOABS 0.9 04/15/2010 2135   EOSABS 0.2 09/18/2017 1049   BASOSABS 0.0 09/18/2017 1049   Iron/TIBC/Ferritin/ %Sat No results found for: IRON, TIBC, FERRITIN, IRONPCTSAT Lipid Panel     Component Value Date/Time   CHOL 161 09/18/2017 1049   TRIG 233 (H) 09/18/2017 1049   HDL 58 09/18/2017 1049   CHOLHDL 2 01/18/2017 0902   VLDL 28.2 01/18/2017 0902   LDLCALC 56 09/18/2017 1049   LDLDIRECT 77.0 01/18/2017 0902   Hepatic Function Panel     Component Value Date/Time   PROT 6.5 09/18/2017  1049   ALBUMIN 4.7 09/18/2017 1049   AST 35 09/18/2017 1049   ALT 42 (H) 09/18/2017 1049   ALKPHOS 92 09/18/2017 1049   BILITOT 0.3 09/18/2017 1049   BILIDIR <0.1 04/15/2010 2135   IBILI NOT CALCULATED 04/15/2010 2135      Component Value Date/Time   TSH 3.430 09/18/2017 1049   TSH 2.09 05/25/2015 0820   TSH 1.89 12/31/2007 0831    ASSESSMENT AND PLAN: Prediabetes  Other depression - with emotional eating  Class 2 severe obesity with serious comorbidity and body mass index (BMI) of 35.0 to 35.9 in adult, unspecified obesity type ([place])  PLAN:  Pre-Diabetes Angie Velasquez will continue to work on weight loss, exercise, and decreasing simple carbohydrates in her diet to help decrease the risk of diabetes. We dicussed metformin including benefits and risks. She was informed that eating too many simple carbohydrates or too many calories at one sitting increases the likelihood of GI side effects. Harly declined metformin for now and a prescription was not written today. We will repeat labs at next appointment. Angie Velasquez agrees to follow up with our clinic in 2 weeks as directed to monitor her progress.  Depression with Emotional Eating Behaviors We discussed behavior modification techniques today to help Angie Velasquez deal with her emotional eating and depression. Angie Velasquez agrees to continue taking Wellbutrin and she agrees to follow up with our clinic in 2 weeks.  We spent > than 50% of the 15 minute visit on the counseling as documented in the note.  Obesity Angie Velasquez is currently in the action stage of change. As such, her goal is to continue with weight loss efforts She has agreed to follow the Category 2 plan Angie Velasquez has been instructed to work up to a goal of 150 minutes of combined cardio and strengthening exercise per week for weight loss and overall health benefits. We discussed the following Behavioral Modification Strategies today: increasing lean protein intake, increasing vegetables,  work on meal planning and easy cooking plans, and planning for success   Angie Velasquez has agreed to follow up with our clinic in 2 weeks. She was informed of the importance of frequent follow up visits to maximize her success with intensive lifestyle modifications for her multiple health conditions.   OBESITY BEHAVIORAL INTERVENTION VISIT  Today's visit was # 14 out of 22.  Starting weight: 249 lbs Starting date: 09/18/17 Today's weight : 235 lbs Today's date: 07/01/2018 Total lbs lost to date: 14     ASK: We discussed the diagnosis of obesity with Angie Velasquez today and Angie Velasquez agreed to give [name] permission to discuss obesity behavioral modification therapy  today.  ASSESS: Angie Velasquez has the diagnosis of obesity and her BMI today is 35.74 Angie Velasquez is in the action stage of change   ADVISE: Angie Velasquez was educated on the multiple health risks of obesity as well as the benefit of weight loss to improve her health. She was advised of the need for long term treatment and the importance of lifestyle modifications.  AGREE: Multiple dietary modification options and treatment options were discussed and  Angie Velasquez agreed to the above obesity treatment plan.  I, Trixie Dredge, am acting as transcriptionist for Ilene Qua, MD  I have reviewed the above documentation for accuracy and completeness, and I agree with the above. - Ilene Qua, MD

## 2018-07-05 ENCOUNTER — Other Ambulatory Visit (INDEPENDENT_AMBULATORY_CARE_PROVIDER_SITE_OTHER): Payer: Self-pay | Admitting: Physician Assistant

## 2018-07-05 DIAGNOSIS — F3289 Other specified depressive episodes: Secondary | ICD-10-CM

## 2018-07-10 ENCOUNTER — Other Ambulatory Visit: Payer: Self-pay | Admitting: Family Medicine

## 2018-07-25 ENCOUNTER — Ambulatory Visit (INDEPENDENT_AMBULATORY_CARE_PROVIDER_SITE_OTHER): Payer: Medicare HMO | Admitting: Family Medicine

## 2018-07-25 VITALS — BP 92/55 | HR 71 | Temp 97.4°F | Ht 68.0 in | Wt 235.0 lb

## 2018-07-25 DIAGNOSIS — E7849 Other hyperlipidemia: Secondary | ICD-10-CM | POA: Diagnosis not present

## 2018-07-25 DIAGNOSIS — Z6835 Body mass index (BMI) 35.0-35.9, adult: Secondary | ICD-10-CM

## 2018-07-25 DIAGNOSIS — R7303 Prediabetes: Secondary | ICD-10-CM

## 2018-07-25 DIAGNOSIS — E559 Vitamin D deficiency, unspecified: Secondary | ICD-10-CM | POA: Diagnosis not present

## 2018-07-25 IMAGING — CR DG HIP (WITH OR WITHOUT PELVIS) 2-3V*R*
3 series · 3 of 3 positions shown · non-contrast
Comparison: None.

CLINICAL DATA: Right hip pain.  No known injury.

EXAM:
DG HIP (WITH OR WITHOUT PELVIS) 2-3V RIGHT

[t hip ap right]
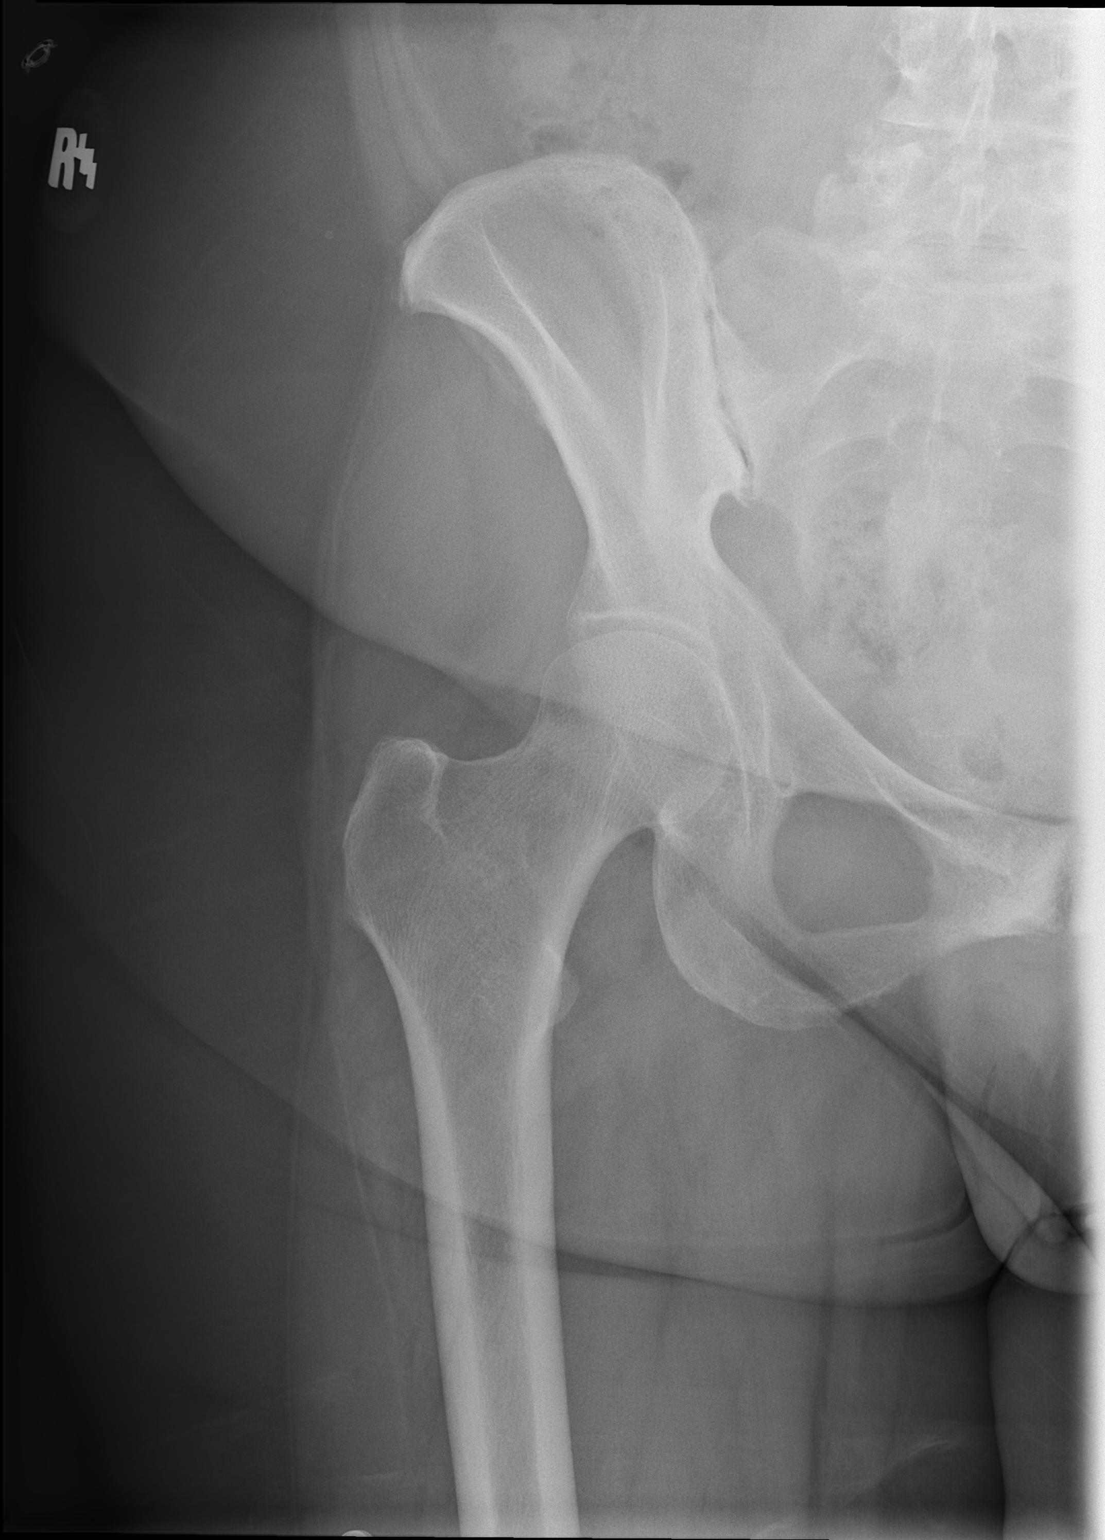

[t hip frog leg right]
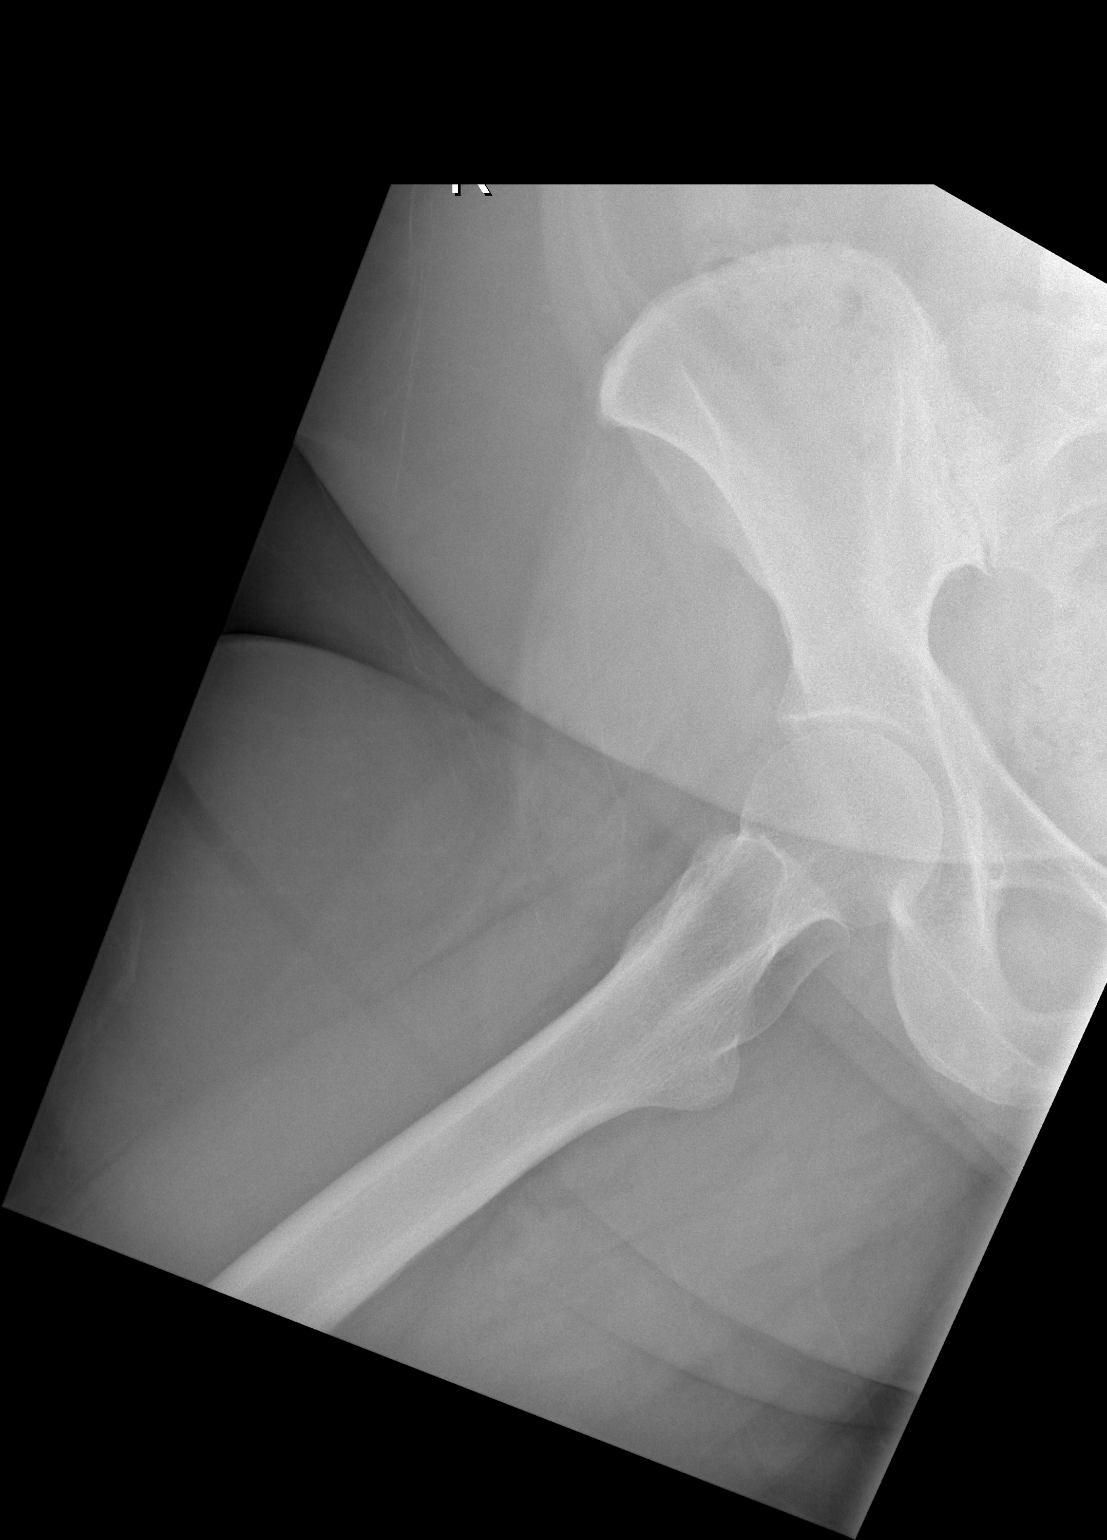

[t pelvis ap]
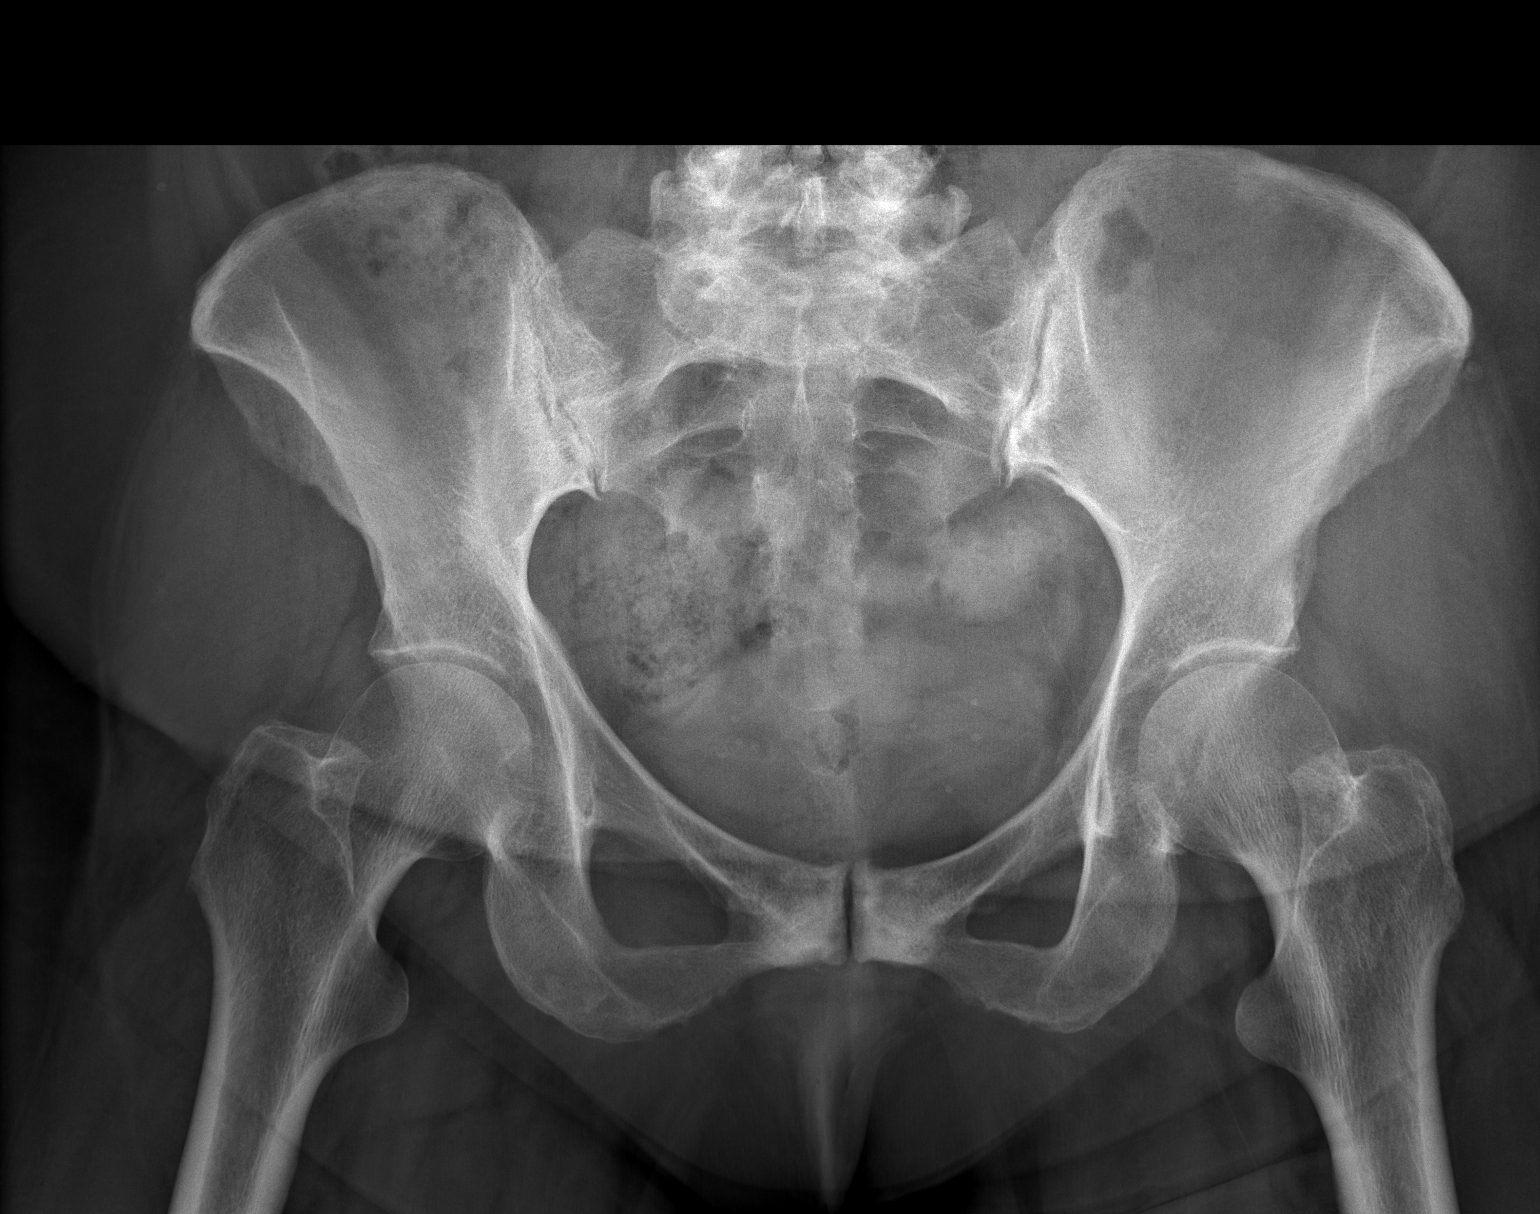

[3 of 3 positions shown; findings below may reference images not displayed]

FINDINGS: Hip joints and SI joints are symmetric and unremarkable. No acute
bony abnormality. Specifically, no fracture, subluxation, or
dislocation. Soft tissues are intact.
IMPRESSION: Negative.

## 2018-07-25 NOTE — Progress Notes (Signed)
Office: [phone]  /  Fax: [phone]   HPI:   Chief Complaint: OBESITY Angie Velasquez is here to discuss her progress with her obesity treatment plan. She is on the Category 2 plan and is following her eating plan approximately 10 % of the time. She states she is doing stairs and core exercises 30 minutes 10 times per week. Angie Velasquez has increased traveling and eating out, but she is back in town now; and she is ready to get back on track. Her weight is 235 lb (106.6 kg) today and has not lost weight since her last visit. She has lost 14 lbs since starting treatment with [name].  Vitamin D deficiency Angie Velasquez has a diagnosis of vitamin D deficiency. She is currently taking OTC vit D and denies nausea, vomiting or muscle weakness.  Pre-Diabetes Angie Velasquez has a diagnosis of prediabetes based on her elevated Hgb A1c and was informed this puts her at greater risk of developing diabetes. Tuesday is not taking metformin currently and she is attempting to control her prediabetes with diet. Angie Velasquez continues to work on diet and exercise to decrease risk of diabetes. She denies nausea or hypoglycemia.  Hyperlipidemia Angie Velasquez has hyperlipidemia and is currently on Zocor. She has been trying to improve her cholesterol levels with intensive lifestyle modification including a low saturated fat diet, exercise and weight loss. She denies any chest pain or myalgias.  ALLERGIES: Allergies  Allergen Reactions  . Epinephrine Palpitations  . Codeine Nausea Only  . Penicillins Nausea Only    MEDICATIONS: Current Outpatient Medications on File Prior to Visit  Medication Sig Dispense Refill  . acetaminophen (TYLENOL) 500 MG tablet Take 500 mg by mouth every 6 (six) hours as needed.    [name] allopurinol (ZYLOPRIM) 300 MG tablet TAKE 1 TABLET (300 MG TOTAL) ONCE A DAY 90 tablet 3  . aspirin 81 MG tablet Take 81 mg by mouth daily.    [name] buPROPion (WELLBUTRIN SR) 200 MG 12 hr tablet TAKE 1 TABLET BY MOUTH DAILY AT 12  NOON 30 tablet 0  . CALCIUM-MAGNESIUM-ZINC PO Take by mouth.    . clonazePAM (KLONOPIN) 0.5 MG tablet Take 1 tablet (0.5 mg total) by mouth at bedtime. 90 tablet 1  . clonazePAM (KLONOPIN) 0.5 MG tablet Take 1 tablet (0.5 mg total) by mouth at bedtime as needed (sleep. 8 day refill while traveling and awaiting mail order refill.). 8 tablet 0  . diazepam (VALIUM) 10 MG tablet Take 10 mg by mouth every 6 (six) hours as needed (as needed for muscle spasms).    . diclofenac sodium (VOLTAREN) 1 % GEL Apply topically to affected area qid 100 g 2  . gabapentin (NEURONTIN) 300 MG capsule Take 1 capsule, orally, up to TID or as directed 270 capsule 1  . lisinopril (PRINIVIL,ZESTRIL) 2.5 MG tablet TAKE 1 TABLET EVERY DAY 90 tablet 3  . nystatin cream (MYCOSTATIN) Apply 1 application topically 2 (two) times daily. To rash in groin for up to 2 weeks maximum 90 g 2  . OVER THE COUNTER MEDICATION Cherry extract bid    . OVER THE COUNTER MEDICATION B-12 one daily    . OVER THE COUNTER MEDICATION Vitamin D    . polyethylene glycol powder (GLYCOLAX/MIRALAX) powder Take 17 g by mouth daily. 3350 g 0  . polyethylene glycol powder (GLYCOLAX/MIRALAX) powder Take 17 g by mouth daily. 3350 g 0  . sertraline (ZOLOFT) 50 MG tablet TAKE 1 TABLET (50 MG TOTAL) BY MOUTH DAILY. 90 tablet 3  .  simvastatin (ZOCOR) 20 MG tablet TAKE 1 TABLET AT BEDTIME 90 tablet 0  . Sodium Bicarbonate (NICE PURE BAKING SODA) POWD by Does not apply route.    . traMADol (ULTRAM) 50 MG tablet Take 1 tablet (50 mg total) by mouth every 6 (six) hours as needed for moderate pain or severe pain. 60 tablet 1  . triamcinolone cream (KENALOG) 0.1 % Apply 1 application topically 2 (two) times daily. For 7-10 days for recurring eczema issues 80 g 1   No current facility-administered medications on file prior to visit.     PAST MEDICAL HISTORY: Past Medical History:  Diagnosis Date  . Anxiety    years ago - no longer an issue  . Arthritis    gout    . Cataracts, bilateral   . Complication of anesthesia   . Fracture of pelvis ([place])   . GERD (gastroesophageal reflux disease)   . Hx of chest pain    "anxiety"  . Hyperlipidemia   . INSOMNIA 10/14/2008   in past  . Joint pain   . Kidney problem   . Low blood sugar    eats several smaller meals a day  . Obesity   . Palpitations    Stress related   . Proteinuria    H/O  . Tobacco abuse    quit smoking 08/2013    PAST SURGICAL HISTORY: Past Surgical History:  Procedure Laterality Date  . ABDOMINAL HYSTERECTOMY    . APPENDECTOMY     with hysterectomy  . childbirth     x4  . CHOLECYSTECTOMY  2001  . EYE SURGERY Bilateral 2015   cataract surgery with lens implant  . NEPHRECTOMY  1992   complex cystic mass - nephrectomy right  . ORIF ANKLE FRACTURE Right 07/02/2014   Procedure: OPEN REDUCTION INTERNAL FIXATION (ORIF) RIGHT ANKLE BIMALLOELAR FRACTURE;  Surgeon: Wylene Simmer, MD;  Location: [facility];  Service: Orthopedics;  Laterality: Right;  . OTHER SURGICAL HISTORY     hysterectomy, R ovary remains  . SHOULDER SURGERY     bone spurs left    SOCIAL HISTORY: Social History   Tobacco Use  . Smoking status: Former Smoker    Packs/day: 1.00    Years: 35.00    Pack years: 35.00    Types: Cigarettes    Last attempt to quit: 09/01/2013    Years since quitting: 4.8  . Smokeless tobacco: Never Used  . Tobacco comment: Former smoker with no desire to smoke again. Smoking years edited at patients request to include periods when she did not smoke.  Substance Use Topics  . Alcohol use: Yes    Alcohol/week: 1.0 standard drinks    Types: 1 Shots of liquor per week  . Drug use: No    FAMILY HISTORY: Family History  Problem Relation Age of Onset  . Stroke Father   . Alzheimer's disease Father        early, states also parkinsons. died 56  . Hypertension Father   . Obesity Father   . Ovarian cancer Mother        Dr. Jana Hakim   . Arthritis Mother   . Obesity Mother   .  Colon cancer Paternal Grandmother   . Liver disease Daughter        On hospice age 16    ROS: Review of Systems  Constitutional: Negative for weight loss.  Cardiovascular: Negative for chest pain.  Gastrointestinal: Negative for nausea and vomiting.  Musculoskeletal: Negative for myalgias.  Negative for muscle weakness  Endo/Heme/Allergies:       Negative for hypoglycemia    PHYSICAL EXAM: Blood pressure (!) 92/55, pulse 71, temperature (!) 97.4 F (36.3 C), temperature source Oral, height 5\' 8"  (1.727 m), weight 235 lb (106.6 kg), SpO2 96 %. Body mass index is 35.73 kg/m. Physical Exam  Constitutional: She is oriented to person, place, and time. She appears well-developed and well-nourished.  Cardiovascular: Normal rate.  Pulmonary/Chest: Effort normal.  Musculoskeletal: Normal range of motion.  Neurological: She is oriented to person, place, and time.  Skin: Skin is warm and dry.  Psychiatric: She has a normal mood and affect. Her behavior is normal.  Vitals reviewed.   RECENT LABS AND TESTS: BMET    Component Value Date/Time   NA 138 09/18/2017 1049   K 4.2 09/18/2017 1049   CL 98 09/18/2017 1049   CO2 23 09/18/2017 1049   GLUCOSE 96 09/18/2017 1049   GLUCOSE 109 (H) 01/18/2017 0902   BUN 16 09/18/2017 1049   CREATININE 1.19 (H) 09/18/2017 1049   CALCIUM 9.5 09/18/2017 1049   GFRNONAA 46 (L) 09/18/2017 1049   GFRAA 53 (L) 09/18/2017 1049   Lab Results  Component Value Date   HGBA1C 5.7 (H) 09/18/2017   HGBA1C (H) 04/15/2010    5.9 (NOTE)                                                                       According to the ADA Clinical Practice Recommendations for 2011, when HbA1c is used as a screening test:   >=6.5%   Diagnostic of Diabetes Mellitus           (if abnormal result  is confirmed)  5.7-6.4%   Increased risk of developing Diabetes Mellitus  References:Diagnosis and Classification of Diabetes Mellitus,Diabetes QIWL,7989,21(JHERD 1):S62-S69  and Standards of Medical Care in         Diabetes - 2011,Diabetes Care,2011,34  (Suppl 1):S11-S61.   HGBA1C 5.8 12/31/2007   Lab Results  Component Value Date   INSULIN 25.3 (H) 09/18/2017   CBC    Component Value Date/Time   WBC 6.4 09/18/2017 1049   WBC 6.6 01/18/2017 0902   RBC 4.57 09/18/2017 1049   RBC 4.39 01/18/2017 0902   HGB 13.5 09/18/2017 1049   HCT 40.4 09/18/2017 1049   PLT 240.0 01/18/2017 0902   MCV 88 09/18/2017 1049   MCH 29.5 09/18/2017 1049   MCH 29.4 07/02/2014 1143   MCHC 33.4 09/18/2017 1049   MCHC 33.2 01/18/2017 0902   RDW 15.2 09/18/2017 1049   LYMPHSABS 2.6 09/18/2017 1049   MONOABS 0.9 04/15/2010 2135   EOSABS 0.2 09/18/2017 1049   BASOSABS 0.0 09/18/2017 1049   Iron/TIBC/Ferritin/ %Sat No results found for: IRON, TIBC, FERRITIN, IRONPCTSAT Lipid Panel     Component Value Date/Time   CHOL 161 09/18/2017 1049   TRIG 233 (H) 09/18/2017 1049   HDL 58 09/18/2017 1049   CHOLHDL 2 01/18/2017 0902   VLDL 28.2 01/18/2017 0902   LDLCALC 56 09/18/2017 1049   LDLDIRECT 77.0 01/18/2017 0902   Hepatic Function Panel     Component Value Date/Time   PROT 6.5 09/18/2017 1049   ALBUMIN 4.7 09/18/2017 1049   AST 35  09/18/2017 1049   ALT 42 (H) 09/18/2017 1049   ALKPHOS 92 09/18/2017 1049   BILITOT 0.3 09/18/2017 1049   BILIDIR <0.1 04/15/2010 2135   IBILI NOT CALCULATED 04/15/2010 2135      Component Value Date/Time   TSH 3.430 09/18/2017 1049   TSH 2.09 05/25/2015 0820   TSH 1.89 12/31/2007 0831   Results for Angie Bonus ELIZABETH "[name]" (MRN [id number]) as of 07/25/2018 11:42  Ref. Range 09/18/2017 10:49  Vitamin D, 25-Hydroxy Latest Ref Range: 30.0 - 100.0 ng/mL 45.6   ASSESSMENT AND PLAN: Vitamin D deficiency - Plan: VITAMIN D 25 Hydroxy (Vit-D Deficiency, Fractures)  Prediabetes - Plan: Comprehensive metabolic panel, Hemoglobin A1c, Insulin, random  Other hyperlipidemia - Plan: Lipid Panel With LDL/HDL Ratio  Class 2 severe obesity  with serious comorbidity and body mass index (BMI) of 35.0 to 35.9 in adult, unspecified obesity type ([place])  PLAN:  Vitamin D Deficiency Noorah was informed that low vitamin D levels contributes to fatigue and are associated with obesity, breast, and colon cancer. She will continue OTC vitamin D and diet. We will check labs and will follow up for routine testing of vitamin D, at least 2-3 times per year. She was informed of the risk of over-replacement of vitamin D and agrees to not increase her dose unless she discusses this with [name] first.  Pre-Diabetes Verbie will continue to work on weight loss, exercise, and decreasing simple carbohydrates in her diet to help decrease the risk of diabetes. She was informed that eating too many simple carbohydrates or too many calories at one sitting increases the likelihood of GI side effects. We will check labs and Etola agreed to follow up with [name] as directed to monitor her progress.  Hyperlipidemia Nafeesah was informed of the American Heart Association Guidelines emphasizing intensive lifestyle modifications as the first line treatment for hyperlipidemia. We discussed many lifestyle modifications today in depth, and Chaniece will continue to work on decreasing saturated fats such as fatty red meat, butter and many fried foods. She will also increase vegetables and lean protein in her diet and continue to work on exercise and weight loss efforts. We will check labs and follow up with results in 3 weeks.  Obesity Zoye is currently in the action stage of change. As such, her goal is to continue with weight loss efforts She has agreed to follow the Category 2 plan Alivea has been instructed to work up to a goal of 150 minutes of combined cardio and strengthening exercise per week for weight loss and overall health benefits. We discussed the following Behavioral Modification Strategies today: increasing lean protein intake, decreasing simple carbohydrates   and work on meal planning and easy cooking plans  Rhemi has agreed to follow up with our clinic in 3 weeks. She was informed of the importance of frequent follow up visits to maximize her success with intensive lifestyle modifications for her multiple health conditions.   OBESITY BEHAVIORAL INTERVENTION VISIT  Today's visit was # 15   Starting weight: 249 lbs Starting date: 09/18/17 Today's weight : 235 lbs  Today's date: 07/25/2018 Total lbs lost to date: 14 At least 15 minutes were spent on discussing the following behavioral intervention visit.   ASK: We discussed the diagnosis of obesity with [name] today and Krishauna agreed to give [name] permission to discuss obesity behavioral modification therapy today.  ASSESS: Rosalin has the diagnosis of obesity and her BMI today is 35.74 Anelis is in the action stage  of change   ADVISE: Brisha was educated on the multiple health risks of obesity as well as the benefit of weight loss to improve her health. She was advised of the need for long term treatment and the importance of lifestyle modifications to improve her current health and to decrease her risk of future health problems.  AGREE: Multiple dietary modification options and treatment options were discussed and  Glynnis agreed to follow the recommendations documented in the above note.  ARRANGE: Kayani was educated on the importance of frequent visits to treat obesity as outlined per CMS and USPSTF guidelines and agreed to schedule her next follow up appointment today.  I, Doreene Nest, am acting as transcriptionist for Dennard Nip, MD  I have reviewed the above documentation for accuracy and completeness, and I agree with the above. -Dennard Nip, MD

## 2018-07-26 LAB — LIPID PANEL WITH LDL/HDL RATIO
CHOLESTEROL TOTAL: 200 mg/dL — AB (ref 100–199)
HDL: 68 mg/dL (ref >39)
LDL Calculated: 99 mg/dL (ref 0–99)
LDl/HDL Ratio: 1.5 ratio (ref 0.0–3.2)
Triglycerides: 164 mg/dL — ABNORMAL HIGH (ref 0–149)
VLDL CHOLESTEROL CAL: 33 mg/dL (ref 5–40)

## 2018-07-26 LAB — COMPREHENSIVE METABOLIC PANEL
ALT: 17 IU/L (ref 0–32)
AST: 17 IU/L (ref 0–40)
Albumin/Globulin Ratio: 2.3 — ABNORMAL HIGH (ref 1.2–2.2)
Albumin: 4.5 g/dL (ref 3.5–4.8)
Alkaline Phosphatase: 99 IU/L (ref 39–117)
BUN/Creatinine Ratio: 14 (ref 12–28)
BUN: 17 mg/dL (ref 8–27)
Bilirubin Total: 0.2 mg/dL (ref 0.0–1.2)
CO2: 22 mmol/L (ref 20–29)
Calcium: 9.3 mg/dL (ref 8.7–10.3)
Chloride: 103 mmol/L (ref 96–106)
Creatinine, Ser: 1.21 mg/dL — ABNORMAL HIGH (ref 0.57–1.00)
GFR, EST AFRICAN AMERICAN: 52 mL/min/{1.73_m2} — AB (ref >59)
GFR, EST NON AFRICAN AMERICAN: 45 mL/min/{1.73_m2} — AB (ref >59)
GLUCOSE: 98 mg/dL (ref 65–99)
Globulin, Total: 2 g/dL (ref 1.5–4.5)
Potassium: 4.1 mmol/L (ref 3.5–5.2)
Sodium: 141 mmol/L (ref 134–144)
TOTAL PROTEIN: 6.5 g/dL (ref 6.0–8.5)

## 2018-07-26 LAB — INSULIN, RANDOM: INSULIN: 22 u[IU]/mL (ref 2.6–24.9)

## 2018-07-26 LAB — HEMOGLOBIN A1C
ESTIMATED AVERAGE GLUCOSE: 120 mg/dL
Hgb A1c MFr Bld: 5.8 % — ABNORMAL HIGH (ref 4.8–5.6)

## 2018-07-26 LAB — VITAMIN D 25 HYDROXY (VIT D DEFICIENCY, FRACTURES): VIT D 25 HYDROXY: 39.1 ng/mL (ref 30.0–100.0)

## 2018-07-29 ENCOUNTER — Telehealth: Payer: Self-pay | Admitting: Family Medicine

## 2018-07-29 DIAGNOSIS — H9190 Unspecified hearing loss, unspecified ear: Secondary | ICD-10-CM

## 2018-07-29 NOTE — Telephone Encounter (Signed)
Left detailed message on personal voicemail order for referral to Audiology [facility] has been placed and someone will be contacting you to schedule an appt. Any questions please call office.

## 2018-07-29 NOTE — Telephone Encounter (Signed)
Yes thanks 

## 2018-07-29 NOTE — Telephone Encounter (Signed)
Copied from [place]. Topic: Quick Communication - See Telephone Encounter >> Jul 29, 2018 12:48 PM Blase Mess A wrote: CRM for notification. See Telephone encounter for: 07/29/18. Patient is requesting a referral to be placed to [facility] Fax [phone]

## 2018-07-29 NOTE — Telephone Encounter (Signed)
See note

## 2018-07-29 NOTE — Telephone Encounter (Signed)
Dr. Yong Channel, okay for Audiology referral?

## 2018-08-14 DIAGNOSIS — R87619 Unspecified abnormal cytological findings in specimens from cervix uteri: Secondary | ICD-10-CM | POA: Diagnosis not present

## 2018-08-14 DIAGNOSIS — Z01419 Encounter for gynecological examination (general) (routine) without abnormal findings: Secondary | ICD-10-CM | POA: Diagnosis not present

## 2018-08-14 DIAGNOSIS — Z1231 Encounter for screening mammogram for malignant neoplasm of breast: Secondary | ICD-10-CM | POA: Diagnosis not present

## 2018-08-14 DIAGNOSIS — Z9189 Other specified personal risk factors, not elsewhere classified: Secondary | ICD-10-CM | POA: Diagnosis not present

## 2018-08-14 LAB — HM MAMMOGRAPHY

## 2018-08-15 ENCOUNTER — Ambulatory Visit (INDEPENDENT_AMBULATORY_CARE_PROVIDER_SITE_OTHER): Payer: Medicare HMO | Admitting: Family Medicine

## 2018-08-15 ENCOUNTER — Encounter: Payer: Self-pay | Admitting: Family Medicine

## 2018-08-15 VITALS — BP 102/68 | HR 73 | Temp 97.8°F | Ht 68.0 in | Wt 238.0 lb

## 2018-08-15 DIAGNOSIS — R7303 Prediabetes: Secondary | ICD-10-CM

## 2018-08-15 DIAGNOSIS — E559 Vitamin D deficiency, unspecified: Secondary | ICD-10-CM

## 2018-08-15 DIAGNOSIS — Z6836 Body mass index (BMI) 36.0-36.9, adult: Secondary | ICD-10-CM | POA: Diagnosis not present

## 2018-08-15 DIAGNOSIS — I10 Essential (primary) hypertension: Secondary | ICD-10-CM

## 2018-08-15 DIAGNOSIS — H903 Sensorineural hearing loss, bilateral: Secondary | ICD-10-CM | POA: Diagnosis not present

## 2018-08-15 DIAGNOSIS — H9312 Tinnitus, left ear: Secondary | ICD-10-CM | POA: Diagnosis not present

## 2018-08-15 MED ORDER — VITAMIN D (ERGOCALCIFEROL) 1.25 MG (50000 UNIT) PO CAPS: 50000.0000 [IU] | ORAL_CAPSULE | ORAL | 0 refills | Status: DC

## 2018-08-19 ENCOUNTER — Ambulatory Visit (INDEPENDENT_AMBULATORY_CARE_PROVIDER_SITE_OTHER): Payer: Medicare HMO

## 2018-08-19 DIAGNOSIS — Z23 Encounter for immunization: Secondary | ICD-10-CM | POA: Diagnosis not present

## 2018-08-19 NOTE — Patient Instructions (Signed)
Health Maintenance Due  Topic Date Due  . MAMMOGRAM  07/19/2018    Depression screen [facility] 2/9 01/10/2018 09/18/2017 09/05/2017  Decreased Interest 1 1 2   Down, Depressed, Hopeless 2 1 2   PHQ - 2 Score 3 2 4   Altered sleeping 0 0 0  Tired, decreased energy 0 1 1  Change in appetite 0 1 0  Feeling bad or failure about yourself  0 1 0  Trouble concentrating 0 1 0  Moving slowly or fidgety/restless 0 0 0  Suicidal thoughts 0 1 0  PHQ-9 Score 3 7 5   Difficult doing work/chores Somewhat difficult Not difficult at all Somewhat difficult

## 2018-08-19 NOTE — Progress Notes (Signed)
Patient in today for Flu vaccine. VIS give. Administered in right arm. Tolerated well.

## 2018-08-20 NOTE — Progress Notes (Signed)
Office: [phone]  /  Fax: [phone]   HPI:   Chief Complaint: OBESITY Angie Velasquez is here to discuss her progress with her obesity treatment plan. She is on the Category 2 plan and is following her eating plan approximately 50 % of the time. She states she is exercising 0 minutes 0 times per week. Angie Velasquez is currently struggling to follow a structured plan. She would like to try something different and not so structured.  Her weight is 238 lb (108 kg) today and has not lost weight since her last visit. She has lost 11 lbs since starting treatment with [name].  Hypertension Angie Velasquez is a 72 y.o. female with hypertension.  Angie Velasquez denies chest pain or headaches. She is working on weight loss to help control her blood pressure with the goal of decreasing her risk of heart attack and stroke. Kaleya's blood pressure is stable on medications.  Vitamin D deficiency Angie Velasquez has a diagnosis of vitamin D deficiency. She is currently taking OTC vit D, her vitamin D level is decreased. She admits fatigue.  Pre-Diabetes Angie Velasquez has a diagnosis of pre-diabetes based on her slightly creeping up Hgb A1c which is 5.8. She was informed that this puts her at greater risk of developing diabetes. She does not want to take medications, but is struggling to follow her diet.  ALLERGIES: Allergies  Allergen Reactions  . Epinephrine Palpitations  . Codeine Nausea Only  . Penicillins Nausea Only    MEDICATIONS: Current Outpatient Medications on File Prior to Visit  Medication Sig Dispense Refill  . acetaminophen (TYLENOL) 500 MG tablet Take 500 mg by mouth every 6 (six) hours as needed.    [name] allopurinol (ZYLOPRIM) 300 MG tablet TAKE 1 TABLET (300 MG TOTAL) ONCE A DAY 90 tablet 3  . aspirin 81 MG tablet Take 81 mg by mouth daily.    [name] CALCIUM-MAGNESIUM-ZINC PO Take by mouth.    . clonazePAM (KLONOPIN) 0.5 MG tablet Take 1 tablet (0.5 mg total) by mouth at bedtime. 90 tablet 1   . clonazePAM (KLONOPIN) 0.5 MG tablet Take 1 tablet (0.5 mg total) by mouth at bedtime as needed (sleep. 8 day refill while traveling and awaiting mail order refill.). 8 tablet 0  . diazepam (VALIUM) 10 MG tablet Take 10 mg by mouth every 6 (six) hours as needed (as needed for muscle spasms).    . diclofenac sodium (VOLTAREN) 1 % GEL Apply topically to affected area qid 100 g 2  . gabapentin (NEURONTIN) 300 MG capsule Take 1 capsule, orally, up to TID or as directed 270 capsule 1  . lisinopril (PRINIVIL,ZESTRIL) 2.5 MG tablet TAKE 1 TABLET EVERY DAY 90 tablet 3  . nystatin cream (MYCOSTATIN) Apply 1 application topically 2 (two) times daily. To rash in groin for up to 2 weeks maximum 90 g 2  . OVER THE COUNTER MEDICATION Cherry extract bid    . OVER THE COUNTER MEDICATION B-12 one daily    . OVER THE COUNTER MEDICATION Vitamin D    . polyethylene glycol powder (GLYCOLAX/MIRALAX) powder Take 17 g by mouth daily. 3350 g 0  . polyethylene glycol powder (GLYCOLAX/MIRALAX) powder Take 17 g by mouth daily. 3350 g 0  . sertraline (ZOLOFT) 50 MG tablet TAKE 1 TABLET (50 MG TOTAL) BY MOUTH DAILY. 90 tablet 3  . simvastatin (ZOCOR) 20 MG tablet TAKE 1 TABLET AT BEDTIME 90 tablet 0  . Sodium Bicarbonate (NICE PURE BAKING SODA) POWD by Does not apply route.    [name]  traMADol (ULTRAM) 50 MG tablet Take 1 tablet (50 mg total) by mouth every 6 (six) hours as needed for moderate pain or severe pain. 60 tablet 1  . triamcinolone cream (KENALOG) 0.1 % Apply 1 application topically 2 (two) times daily. For 7-10 days for recurring eczema issues 80 g 1   No current facility-administered medications on file prior to visit.     PAST MEDICAL HISTORY: Past Medical History:  Diagnosis Date  . Anxiety    years ago - no longer an issue  . Arthritis    gout  . Cataracts, bilateral   . Complication of anesthesia   . Fracture of pelvis ([place])   . GERD (gastroesophageal reflux disease)   . Hx of chest pain    "anxiety"   . Hyperlipidemia   . INSOMNIA 10/14/2008   in past  . Joint pain   . Kidney problem   . Low blood sugar    eats several smaller meals a day  . Obesity   . Palpitations    Stress related   . Proteinuria    H/O  . Tobacco abuse    quit smoking 08/2013    PAST SURGICAL HISTORY: Past Surgical History:  Procedure Laterality Date  . ABDOMINAL HYSTERECTOMY    . APPENDECTOMY     with hysterectomy  . childbirth     x4  . CHOLECYSTECTOMY  2001  . EYE SURGERY Bilateral 2015   cataract surgery with lens implant  . NEPHRECTOMY  1992   complex cystic mass - nephrectomy right  . ORIF ANKLE FRACTURE Right 07/02/2014   Procedure: OPEN REDUCTION INTERNAL FIXATION (ORIF) RIGHT ANKLE BIMALLOELAR FRACTURE;  Surgeon: Wylene Simmer, MD;  Location: [facility];  Service: Orthopedics;  Laterality: Right;  . OTHER SURGICAL HISTORY     hysterectomy, R ovary remains  . SHOULDER SURGERY     bone spurs left    SOCIAL HISTORY: Social History   Tobacco Use  . Smoking status: Former Smoker    Packs/day: 1.00    Years: 35.00    Pack years: 35.00    Types: Cigarettes    Last attempt to quit: 09/01/2013    Years since quitting: 4.9  . Smokeless tobacco: Never Used  . Tobacco comment: Former smoker with no desire to smoke again. Smoking years edited at patients request to include periods when she did not smoke.  Substance Use Topics  . Alcohol use: Yes    Alcohol/week: 1.0 standard drinks    Types: 1 Shots of liquor per week  . Drug use: No    FAMILY HISTORY: Family History  Problem Relation Age of Onset  . Stroke Father   . Alzheimer's disease Father        early, states also parkinsons. died 66  . Hypertension Father   . Obesity Father   . Ovarian cancer Mother        Dr. Jana Hakim   . Arthritis Mother   . Obesity Mother   . Colon cancer Paternal Grandmother   . Liver disease Daughter        On hospice age 42    ROS: Review of Systems  Constitutional: Positive for malaise/fatigue.  Negative for weight loss.  Cardiovascular: Negative for chest pain.  Neurological: Negative for headaches.    PHYSICAL EXAM: Blood pressure 102/68, pulse 73, temperature 97.8 F (36.6 C), temperature source Oral, height 5\' 8"  (1.727 m), weight 238 lb (108 kg), SpO2 97 %. Body mass index is 36.19 kg/m. Physical  Exam  Constitutional: She is oriented to person, place, and time. She appears well-developed and well-nourished.  Cardiovascular: Normal rate.  Pulmonary/Chest: Effort normal.  Musculoskeletal: Normal range of motion.  Neurological: She is oriented to person, place, and time.  Skin: Skin is warm and dry.  Psychiatric: She has a normal mood and affect. Her behavior is normal.  Vitals reviewed.   RECENT LABS AND TESTS: BMET    Component Value Date/Time   NA 141 07/25/2018 0836   K 4.1 07/25/2018 0836   CL 103 07/25/2018 0836   CO2 22 07/25/2018 0836   GLUCOSE 98 07/25/2018 0836   GLUCOSE 109 (H) 01/18/2017 0902   BUN 17 07/25/2018 0836   CREATININE 1.21 (H) 07/25/2018 0836   CALCIUM 9.3 07/25/2018 0836   GFRNONAA 45 (L) 07/25/2018 0836   GFRAA 52 (L) 07/25/2018 0836   Lab Results  Component Value Date   HGBA1C 5.8 (H) 07/25/2018   HGBA1C 5.7 (H) 09/18/2017   HGBA1C (H) 04/15/2010    5.9 (NOTE)                                                                       According to the ADA Clinical Practice Recommendations for 2011, when HbA1c is used as a screening test:   >=6.5%   Diagnostic of Diabetes Mellitus           (if abnormal result  is confirmed)  5.7-6.4%   Increased risk of developing Diabetes Mellitus  References:Diagnosis and Classification of Diabetes Mellitus,Diabetes LZJQ,7341,93(XTKWI 1):S62-S69 and Standards of Medical Care in         Diabetes - 2011,Diabetes Care,2011,34  (Suppl 1):S11-S61.   HGBA1C 5.8 12/31/2007   Lab Results  Component Value Date   INSULIN 22.0 07/25/2018   INSULIN 25.3 (H) 09/18/2017   CBC    Component Value Date/Time    WBC 6.4 09/18/2017 1049   WBC 6.6 01/18/2017 0902   RBC 4.57 09/18/2017 1049   RBC 4.39 01/18/2017 0902   HGB 13.5 09/18/2017 1049   HCT 40.4 09/18/2017 1049   PLT 240.0 01/18/2017 0902   MCV 88 09/18/2017 1049   MCH 29.5 09/18/2017 1049   MCH 29.4 07/02/2014 1143   MCHC 33.4 09/18/2017 1049   MCHC 33.2 01/18/2017 0902   RDW 15.2 09/18/2017 1049   LYMPHSABS 2.6 09/18/2017 1049   MONOABS 0.9 04/15/2010 2135   EOSABS 0.2 09/18/2017 1049   BASOSABS 0.0 09/18/2017 1049   Iron/TIBC/Ferritin/ %Sat No results found for: IRON, TIBC, FERRITIN, IRONPCTSAT Lipid Panel     Component Value Date/Time   CHOL 200 (H) 07/25/2018 0836   TRIG 164 (H) 07/25/2018 0836   HDL 68 07/25/2018 0836   CHOLHDL 2 01/18/2017 0902   VLDL 28.2 01/18/2017 0902   LDLCALC 99 07/25/2018 0836   LDLDIRECT 77.0 01/18/2017 0902   Hepatic Function Panel     Component Value Date/Time   PROT 6.5 07/25/2018 0836   ALBUMIN 4.5 07/25/2018 0836   AST 17 07/25/2018 0836   ALT 17 07/25/2018 0836   ALKPHOS 99 07/25/2018 0836   BILITOT 0.2 07/25/2018 0836   BILIDIR <0.1 04/15/2010 2135   IBILI NOT CALCULATED 04/15/2010 2135      Component Value Date/Time  TSH 3.430 09/18/2017 1049   TSH 2.09 05/25/2015 0820   TSH 1.89 12/31/2007 0831   Results for SKYLAH, DELAUTER" (MRN [id number]) as of 08/20/2018 08:53  Ref. Range 07/25/2018 08:36  Vitamin D, 25-Hydroxy Latest Ref Range: 30.0 - 100.0 ng/mL 39.1   ASSESSMENT AND PLAN: Essential hypertension  Vitamin D deficiency - Plan: Vitamin D, Ergocalciferol, (DRISDOL) 50000 units CAPS capsule  Prediabetes  Class 2 severe obesity with serious comorbidity and body mass index (BMI) of 36.0 to 36.9 in adult, unspecified obesity type ([place])  PLAN:  Hypertension We discussed sodium restriction, working on healthy weight loss, and a regular exercise program as the means to achieve improved blood pressure control. Deyja agreed with this plan and agreed to  follow up as directed. We will continue to monitor her blood pressure as well as her progress with the above lifestyle modifications. She will continue her current  medications as prescribed and weight loss. She will watch for signs of hypotension as she continues her lifestyle modifications.  Vitamin D Deficiency Angie Velasquez was informed that low vitamin D levels contributes to fatigue and are associated with obesity, breast, and colon cancer. She agrees to start taking prescription Vit D @50 ,000 IU every week #4 with no refills and will follow up for routine testing of vitamin D, at least 2-3 times per year. She was informed of the risk of over-replacement of vitamin D and agrees to not increase her dose unless she discusses this with [name] first. Frona agreed to follow up in 3 to 4 weeks.  Pre-Diabetes Angie Velasquez will continue to work on weight loss, exercise, and decreasing simple carbohydrates in her diet to help decrease the risk of diabetes. We discussed metformin including benefits and risks. She was informed that eating too many simple carbohydrates or too many calories at one sitting increases the likelihood of GI side effects. She is going to continue her diet and exercise.  Angie Velasquez agreed to follow up with [name] as directed to monitor her progress. We will recheck labs in 3 months.  Obesity Angie Velasquez is currently in the action stage of change. As such, her goal is to continue with weight loss efforts. She has agreed to change to keeping a food journal with 1000 to 1300 calories and 75+ grams of protein.  Angie Velasquez has been instructed to work up to a goal of 150 minutes of combined cardio and strengthening exercise per week for weight loss and overall health benefits. We discussed the following Behavioral Modification Strategies today: increasing lean protein intake, decreasing simple carbohydrates, and work on meal planning and easy cooking plans.  Angie Velasquez has agreed to follow up with our clinic in 3 to  4 weeks. She was informed of the importance of frequent follow up visits to maximize her success with intensive lifestyle modifications for her multiple health conditions.   OBESITY BEHAVIORAL INTERVENTION VISIT  Today's visit was # 16   Starting weight: 249 Starting date: 09/18/17 Today's weight : Weight: 238 lb (108 kg)  Today's date: 08/15/2018 Total lbs lost to date: 11 At least 15 minutes were spent on discussing the following behavioral intervention visit.   ASK: We discussed the diagnosis of obesity with Angie Velasquez today and Errin agreed to give [name] permission to discuss obesity behavioral modification therapy today.  ASSESS: Angie Velasquez has the diagnosis of obesity and her BMI today is 36.2. Angie Velasquez is in the action stage of change.   ADVISE: Hevin was educated on the multiple health risks of  obesity as well as the benefit of weight loss to improve her health. She was advised of the need for long term treatment and the importance of lifestyle modifications to improve her current health and to decrease her risk of future health problems.  AGREE: Multiple dietary modification options and treatment options were discussed and Angie Velasquez agreed to follow the recommendations documented in the above note.  ARRANGE: Angie Velasquez was educated on the importance of frequent visits to treat obesity as outlined per CMS and USPSTF guidelines and agreed to schedule her next follow up appointment today.  I, Marcille Blanco, am acting as transcriptionist for Starlyn Skeans, MD  I have reviewed the above documentation for accuracy and completeness, and I agree with the above. -Dennard Nip, MD

## 2018-09-09 ENCOUNTER — Ambulatory Visit (INDEPENDENT_AMBULATORY_CARE_PROVIDER_SITE_OTHER): Payer: Medicare HMO | Admitting: Family Medicine

## 2018-09-09 VITALS — BP 96/64 | HR 70 | Temp 97.7°F | Ht 68.0 in | Wt 234.0 lb

## 2018-09-09 DIAGNOSIS — E559 Vitamin D deficiency, unspecified: Secondary | ICD-10-CM

## 2018-09-09 DIAGNOSIS — Z6835 Body mass index (BMI) 35.0-35.9, adult: Secondary | ICD-10-CM | POA: Diagnosis not present

## 2018-09-09 DIAGNOSIS — F3289 Other specified depressive episodes: Secondary | ICD-10-CM | POA: Diagnosis not present

## 2018-09-09 MED ORDER — BUPROPION HCL ER (SR) 200 MG PO TB12: 200.0000 mg | ORAL_TABLET | Freq: Every day | ORAL | 0 refills | Status: DC

## 2018-09-09 MED ORDER — VITAMIN D (ERGOCALCIFEROL) 1.25 MG (50000 UNIT) PO CAPS: 50000.0000 [IU] | ORAL_CAPSULE | ORAL | 0 refills | Status: DC

## 2018-09-10 NOTE — Progress Notes (Signed)
Office: [phone]  /  Fax: [phone]   HPI:   Chief Complaint: OBESITY Angie Velasquez is here to discuss her progress with her obesity treatment plan. She is on the Category 2 plan and is following her eating plan approximately 75 % of the time. She states she is exercising 0 minutes 0 times per week. Ndidi is doing well with Category 2. She is eating [place] bowls for breakfast and eating all of her food. She is staying on the plan better. Sela is not exercising due to struggles with hip pain. She struggles with some hunger in the afternoon.  Her weight is 234 lb (106.1 kg) today and has had a weight loss of 4 pounds over a period of 4 weeks since her last visit. She has lost 15 lbs since starting treatment with [name].  Vitamin D deficiency Gigi Onstad has a diagnosis of vitamin D deficiency. She is currently taking vit D, which is stable, but not at goal. She denies nausea, vomiting, or muscle weakness.  Depression with emotional eating behaviors Sharmin is struggling with emotional eating and using food for comfort to the extent that it is negatively impacting her health. She feels that the bupropion helps with cravings and hunger. We discussed saving some food for the afternoon. She is not having any side effects.  ALLERGIES: Allergies  Allergen Reactions  . Epinephrine Palpitations  . Codeine Nausea Only  . Penicillins Nausea Only    MEDICATIONS: Current Outpatient Medications on File Prior to Visit  Medication Sig Dispense Refill  . acetaminophen (TYLENOL) 500 MG tablet Take 500 mg by mouth every 6 (six) hours as needed.    [name] allopurinol (ZYLOPRIM) 300 MG tablet TAKE 1 TABLET (300 MG TOTAL) ONCE A DAY 90 tablet 3  . aspirin 81 MG tablet Take 81 mg by mouth daily.    [name] CALCIUM-MAGNESIUM-ZINC PO Take by mouth.    . clonazePAM (KLONOPIN) 0.5 MG tablet Take 1 tablet (0.5 mg total) by mouth at bedtime as needed (sleep. 8 day refill while traveling and awaiting mail order refill.).  8 tablet 0  . diazepam (VALIUM) 10 MG tablet Take 10 mg by mouth every 6 (six) hours as needed (as needed for muscle spasms).    . diclofenac sodium (VOLTAREN) 1 % GEL Apply topically to affected area qid 100 g 2  . gabapentin (NEURONTIN) 300 MG capsule Take 1 capsule, orally, up to TID or as directed 270 capsule 1  . lisinopril (PRINIVIL,ZESTRIL) 2.5 MG tablet TAKE 1 TABLET EVERY DAY 90 tablet 3  . nystatin cream (MYCOSTATIN) Apply 1 application topically 2 (two) times daily. To rash in groin for up to 2 weeks maximum 90 g 2  . OVER THE COUNTER MEDICATION Cherry extract bid    . OVER THE COUNTER MEDICATION B-12 one daily    . OVER THE COUNTER MEDICATION Vitamin D    . polyethylene glycol powder (GLYCOLAX/MIRALAX) powder Take 17 g by mouth daily. 3350 g 0  . sertraline (ZOLOFT) 50 MG tablet TAKE 1 TABLET (50 MG TOTAL) BY MOUTH DAILY. 90 tablet 3  . simvastatin (ZOCOR) 20 MG tablet TAKE 1 TABLET AT BEDTIME 90 tablet 0  . Sodium Bicarbonate (NICE PURE BAKING SODA) POWD by Does not apply route.    . traMADol (ULTRAM) 50 MG tablet Take 1 tablet (50 mg total) by mouth every 6 (six) hours as needed for moderate pain or severe pain. 60 tablet 1  . triamcinolone cream (KENALOG) 0.1 % Apply 1 application  topically 2 (two) times daily. For 7-10 days for recurring eczema issues 80 g 1   No current facility-administered medications on file prior to visit.     PAST MEDICAL HISTORY: Past Medical History:  Diagnosis Date  . Anxiety    years ago - no longer an issue  . Arthritis    gout  . Cataracts, bilateral   . Complication of anesthesia   . Fracture of pelvis ([place])   . GERD (gastroesophageal reflux disease)   . Hx of chest pain    "anxiety"  . Hyperlipidemia   . INSOMNIA 10/14/2008   in past  . Joint pain   . Kidney problem   . Low blood sugar    eats several smaller meals a day  . Obesity   . Palpitations    Stress related   . Proteinuria    H/O  . Tobacco abuse    quit smoking  08/2013    PAST SURGICAL HISTORY: Past Surgical History:  Procedure Laterality Date  . ABDOMINAL HYSTERECTOMY    . APPENDECTOMY     with hysterectomy  . childbirth     x4  . CHOLECYSTECTOMY  2001  . EYE SURGERY Bilateral 2015   cataract surgery with lens implant  . NEPHRECTOMY  1992   complex cystic mass - nephrectomy right  . ORIF ANKLE FRACTURE Right 07/02/2014   Procedure: OPEN REDUCTION INTERNAL FIXATION (ORIF) RIGHT ANKLE BIMALLOELAR FRACTURE;  Surgeon: Wylene Simmer, MD;  Location: [facility];  Service: Orthopedics;  Laterality: Right;  . OTHER SURGICAL HISTORY     hysterectomy, R ovary remains  . SHOULDER SURGERY     bone spurs left    SOCIAL HISTORY: Social History   Tobacco Use  . Smoking status: Former Smoker    Packs/day: 1.00    Years: 35.00    Pack years: 35.00    Types: Cigarettes    Last attempt to quit: 09/01/2013    Years since quitting: 5.0  . Smokeless tobacco: Never Used  . Tobacco comment: Former smoker with no desire to smoke again. Smoking years edited at patients request to include periods when she did not smoke.  Substance Use Topics  . Alcohol use: Yes    Alcohol/week: 1.0 standard drinks    Types: 1 Shots of liquor per week  . Drug use: No    FAMILY HISTORY: Family History  Problem Relation Age of Onset  . Stroke Father   . Alzheimer's disease Father        early, states also parkinsons. died 47  . Hypertension Father   . Obesity Father   . Ovarian cancer Mother        Dr. Jana Hakim   . Arthritis Mother   . Obesity Mother   . Colon cancer Paternal Grandmother   . Liver disease Daughter        On hospice age 56    ROS: Review of Systems  Constitutional: Positive for weight loss.  Gastrointestinal: Negative for nausea and vomiting.  Musculoskeletal:       Positive for hip pain. Negative for muscle weakness.    PHYSICAL EXAM: Blood pressure 96/64, pulse 70, temperature 97.7 F (36.5 C), temperature source Oral, height 5\' 8"  (1.727  m), weight 234 lb (106.1 kg), SpO2 96 %. Body mass index is 35.58 kg/m. Physical Exam  Constitutional: She is oriented to person, place, and time. She appears well-developed and well-nourished.  Cardiovascular: Normal rate.  Pulmonary/Chest: Effort normal.  Musculoskeletal: Normal range of  motion.  Neurological: She is oriented to person, place, and time.  Skin: Skin is warm and dry.  Psychiatric: She has a normal mood and affect. Her behavior is normal.  Vitals reviewed.   RECENT LABS AND TESTS: BMET    Component Value Date/Time   NA 141 07/25/2018 0836   K 4.1 07/25/2018 0836   CL 103 07/25/2018 0836   CO2 22 07/25/2018 0836   GLUCOSE 98 07/25/2018 0836   GLUCOSE 109 (H) 01/18/2017 0902   BUN 17 07/25/2018 0836   CREATININE 1.21 (H) 07/25/2018 0836   CALCIUM 9.3 07/25/2018 0836   GFRNONAA 45 (L) 07/25/2018 0836   GFRAA 52 (L) 07/25/2018 0836   Lab Results  Component Value Date   HGBA1C 5.8 (H) 07/25/2018   HGBA1C 5.7 (H) 09/18/2017   HGBA1C (H) 04/15/2010    5.9 (NOTE)                                                                       According to the ADA Clinical Practice Recommendations for 2011, when HbA1c is used as a screening test:   >=6.5%   Diagnostic of Diabetes Mellitus           (if abnormal result  is confirmed)  5.7-6.4%   Increased risk of developing Diabetes Mellitus  References:Diagnosis and Classification of Diabetes Mellitus,Diabetes YSAY,3016,01(UXNAT 1):S62-S69 and Standards of Medical Care in         Diabetes - 2011,Diabetes Care,2011,34  (Suppl 1):S11-S61.   HGBA1C 5.8 12/31/2007   Lab Results  Component Value Date   INSULIN 22.0 07/25/2018   INSULIN 25.3 (H) 09/18/2017   CBC    Component Value Date/Time   WBC 6.4 09/18/2017 1049   WBC 6.6 01/18/2017 0902   RBC 4.57 09/18/2017 1049   RBC 4.39 01/18/2017 0902   HGB 13.5 09/18/2017 1049   HCT 40.4 09/18/2017 1049   PLT 240.0 01/18/2017 0902   MCV 88 09/18/2017 1049   MCH 29.5  09/18/2017 1049   MCH 29.4 07/02/2014 1143   MCHC 33.4 09/18/2017 1049   MCHC 33.2 01/18/2017 0902   RDW 15.2 09/18/2017 1049   LYMPHSABS 2.6 09/18/2017 1049   MONOABS 0.9 04/15/2010 2135   EOSABS 0.2 09/18/2017 1049   BASOSABS 0.0 09/18/2017 1049   Iron/TIBC/Ferritin/ %Sat No results found for: IRON, TIBC, FERRITIN, IRONPCTSAT Lipid Panel     Component Value Date/Time   CHOL 200 (H) 07/25/2018 0836   TRIG 164 (H) 07/25/2018 0836   HDL 68 07/25/2018 0836   CHOLHDL 2 01/18/2017 0902   VLDL 28.2 01/18/2017 0902   LDLCALC 99 07/25/2018 0836   LDLDIRECT 77.0 01/18/2017 0902   Hepatic Function Panel     Component Value Date/Time   PROT 6.5 07/25/2018 0836   ALBUMIN 4.5 07/25/2018 0836   AST 17 07/25/2018 0836   ALT 17 07/25/2018 0836   ALKPHOS 99 07/25/2018 0836   BILITOT 0.2 07/25/2018 0836   BILIDIR <0.1 04/15/2010 2135   IBILI NOT CALCULATED 04/15/2010 2135      Component Value Date/Time   TSH 3.430 09/18/2017 1049   TSH 2.09 05/25/2015 0820   TSH 1.89 12/31/2007 0831   Results for Judie Bonus ELIZABETH "[name]" (MRN [id number]) as of 09/10/2018  10:50  Ref. Range 07/25/2018 08:36  Vitamin D, 25-Hydroxy Latest Ref Range: 30.0 - 100.0 ng/mL 39.1    ASSESSMENT AND PLAN: Vitamin D deficiency - Plan: Vitamin D, Ergocalciferol, (DRISDOL) 50000 units CAPS capsule  Other depression - with emotional eating - Plan: buPROPion (WELLBUTRIN SR) 200 MG 12 hr tablet  Class 2 severe obesity with serious comorbidity and body mass index (BMI) of 35.0 to 35.9 in adult, unspecified obesity type ([place])  PLAN:  Vitamin D Deficiency Lenoria was informed that low vitamin D levels contributes to fatigue and are associated with obesity, breast, and colon cancer. She agrees to continue to take prescription Vit D @50 ,000 IU every week #4 with no refills and will follow up for routine testing of vitamin D, at least 2-3 times per year. She was informed of the risk of over-replacement of vitamin D  and agrees to not increase her dose unless she discusses this with [name] first. She agrees to follow up in 3 to 4 weeks.  Depression with Emotional Eating Behaviors We discussed behavior modification techniques today to help Kirstin deal with her emotional eating and depression. She has agreed to continue to take bupropion SR 200 mg qd #30 with no refills and agreed to follow up as directed.  Obesity Teyanna is currently in the action stage of change. As such, her goal is to continue with weight loss efforts. She has agreed to follow the Category 2 plan. Wandy has been instructed to work up to a goal of 150 minutes of combined cardio and strengthening exercise per week for weight loss and overall health benefits. We discussed the following Behavioral Modification Strategies today: increasing lean protein intake, decreasing simple carbohydrates, increase H20 intake, no skipping meals, and planning for success.   Gracee has agreed to follow up with our clinic in 3 to 4 weeks. She was informed of the importance of frequent follow up visits to maximize her success with intensive lifestyle modifications for her multiple health conditions.   OBESITY BEHAVIORAL INTERVENTION VISIT  Today's visit was # 17   Starting weight: 249 lbs Starting date: 09/18/17 Today's weight : Weight: 234 lb (106.1 kg)  Today's date: 09/09/2018 Total lbs lost to date: 15  ASK: We discussed the diagnosis of obesity with [name] today and Trease agreed to give [name] permission to discuss obesity behavioral modification therapy today.  ASSESS: Carollee has the diagnosis of obesity and her BMI today is 35.59. Aneesah is in the action stage of change.   ADVISE: Monetta was educated on the multiple health risks of obesity as well as the benefit of weight loss to improve her health. She was advised of the need for long term treatment and the importance of lifestyle modifications to improve her current health  and to decrease her risk of future health problems.  AGREE: Multiple dietary modification options and treatment options were discussed and Ikeya agreed to follow the recommendations documented in the above note.  ARRANGE: Jennier was educated on the importance of frequent visits to treat obesity as outlined per CMS and USPSTF guidelines and agreed to schedule her next follow up appointment today.  I, Marcille Blanco, am acting as transcriptionist for Starlyn Skeans, MD  I have reviewed the above documentation for accuracy and completeness, and I agree with the above. -Dennard Nip, MD

## 2018-09-11 DIAGNOSIS — Z961 Presence of intraocular lens: Secondary | ICD-10-CM | POA: Diagnosis not present

## 2018-09-11 DIAGNOSIS — H1859 Other hereditary corneal dystrophies: Secondary | ICD-10-CM | POA: Diagnosis not present

## 2018-09-23 ENCOUNTER — Encounter: Payer: Self-pay | Admitting: Family Medicine

## 2018-09-23 ENCOUNTER — Encounter: Payer: Self-pay | Admitting: Sports Medicine

## 2018-09-24 ENCOUNTER — Encounter: Payer: Self-pay | Admitting: Family Medicine

## 2018-09-27 ENCOUNTER — Ambulatory Visit: Payer: Medicare HMO | Admitting: Sports Medicine

## 2018-09-27 ENCOUNTER — Ambulatory Visit: Payer: Self-pay

## 2018-09-27 ENCOUNTER — Encounter: Payer: Self-pay | Admitting: Sports Medicine

## 2018-09-27 VITALS — BP 104/70 | HR 79 | Ht 68.0 in | Wt 234.0 lb

## 2018-09-27 DIAGNOSIS — M869 Osteomyelitis, unspecified: Secondary | ICD-10-CM | POA: Diagnosis not present

## 2018-09-27 DIAGNOSIS — M8530 Osteitis condensans, unspecified site: Secondary | ICD-10-CM

## 2018-09-27 NOTE — Patient Instructions (Addendum)

## 2018-09-27 NOTE — Progress Notes (Signed)
Angie Velasquez. Angie Velasquez, [name]  Angie Velasquez - 72 y.o. female MRN [id number]  Date of birth: 04-Oct-1946  Visit Date: 09/27/2018  PCP: Marin Olp, MD   Referred by: Marin Olp, MD  Scribe(s) for today's visit: Josepha Pigg, CMA  SUBJECTIVE:  Angie Shy "[name]" is here for Follow-up (pelvic pain)  HPI 03/13/2018: Compared to the last office visit, her previously described symptoms are worsening. She traveled recently and had to walk a lot around the airport carrying her purse and carry on. She feels that this has contributed to her increased pelvic pain.  Current symptoms are severe & are nonradiating She has been trying to rest. She stayed off of her feet yesterday and that help. She has tried icing the area with some relief. She has bene taking Gabapentin 300 mg BID. She has also been taking tramadol and tylenol prn, up to twice daily, this just takes the edge off.  She will be moving out soon d/t recent split up with her husband.  Compared to the last office visit, her previously described symptoms of left-sided neck and arm pain show no change. Current symptoms are mild & are radiating to L arm. The more she uses the L arm the worse the pain gets. Pain was worse after 700 mile road trip.  She has been icing her neck/arm, taking Gabapentin, tramadol, and tylenol.   03/15/2018: Compared to the last office visit, her previously described pelvic pain symptoms are worsening.  She states that she is in the process of moving and just had a very bad day yesterday.  She notes having taken 2 hydrocodone yesterday in order to try to sleep last night. She is currently walking w/ a walker. Current symptoms are severe (20/10) & are radiating to R groin from the symphysis pubis. She has been taking Gabapentin 300mg  bid, Tramadol and Tylenol.  She also took 2 hydrocodone yesterday.  She is currently  walking w/ a walker.  06/25/2018: Compared to the last office visit, her previously described symptoms are worsening. Pain started to flare up about 3 weeks ago.  Current symptoms are mild & are radiating to the groin. At times she feels like the pain "grabs her".  She has been taking Gabapentin 300 mg BID, Tramadol, and Tylenol with some relief.  She just got back from traveling recently, he did walk with a walker while at the airport. She is not ambulating with her walker today.   09/27/2018: Compared to the last office visit, her previously described symptoms are worsening, sx started flaring up again about 3 weeks ago. Sx worsen in the afternoon.  Current symptoms are moderate & are radiating to the groin.  She reports no changes with medications for pain or other modalities.   REVIEW OF SYSTEMS: Denies night time disturbances. Denies fevers, chills, or night sweats. Denies unexplained weight loss. Denies personal history of cancer. Denies changes in bowel or bladder habits. Denies recent unreported falls. Denies new or worsening dyspnea or wheezing. Denies headaches or dizziness.  Denies numbness, tingling or weakness  In the extremities.  Denies dizziness or presyncopal episodes Denies lower extremity edema    HISTORY:  Prior history reviewed and updated per electronic medical record.  Social History   Occupational History  . Occupation: retired  Tobacco Use  . Smoking status: Former Smoker    Packs/day: 1.00    Years: 35.00    Pack  years: 35.00    Types: Cigarettes    Last attempt to quit: 09/01/2013    Years since quitting: 5.0  . Smokeless tobacco: Never Used  . Tobacco comment: Former smoker with no desire to smoke again. Smoking years edited at patients request to include periods when she did not smoke.  Substance and Sexual Activity  . Alcohol use: Yes    Alcohol/week: 1.0 standard drinks    Types: 1 Shots of liquor per week  . Drug use: No  . Sexual  activity: Not on file   Social History   Social History Narrative   Divorced. Found love of her life recently in 2016 ([place]). 3 sons. 5 grandkids.    Daughter (had at age 67 and was adopted). To care for [name] her granddaughter when daughter passes- daughter on hospice.       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 MRI PELVIS: severe osteitis pubis .   OBJECTIVE:  VS:  HT:5\' 8"  (172.7 cm)   WT:234 lb (106.1 kg)(pt reported)  BMI:35.59    BP:104/70  HR:79bpm  TEMP: ( )  RESP:95 %   PHYSICAL EXAM: CONSTITUTIONAL: Well-developed, Well-nourished and In no acute distress PSYCHIATRIC: Alert & appropriately interactive. and Not depressed or anxious appearing. RESPIRATORY: No increased work of breathing and Trachea Midline EYES: Pupils are equal., EOM intact without nystagmus. and No scleral icterus.  VASCULAR EXAM: Warm and well perfused NEURO: Normal associated myotomal distribution strength to manual muscle testing Normal sensation to light touch  MSK Exam:  Hips and Pubic Symphysis  Well aligned, no significant deformity. No overlying skin changes. TTP over PUBIC symphysis   SPECIALITY TESTING:  antalgic gait. not on a walker today     ASSESSMENT   1. Osteitis condensans     PLAN:  Pertinent additional documentation may be included in corresponding procedure notes, imaging studies, problem based documentation and patient instructions.  Procedures:  . US Guided Injection per procedure note  Medications:  No orders of the defined types were placed in this encounter.  Discussion/Instructions: No problem-specific Assessment & Plan notes found for this encounter.  . Repeat ultrasound-guided  injection of the pubic symphysis.  Slight needle tenotomy performed of the osteophytic spurring is in this area at the calcific changes appreciated.  She tolerated this well and good improvement in her symptoms on this immediately. . Discussed red flag symptoms that warrant earlier emergent evaluation and patient voices understanding. . Activity modifications and the importance of avoiding exacerbating activities (limiting pain to no more than a 4 / 10 during or following activity) recommended and discussed.  Follow-up:  . Return in about 3 months (around 12/28/2018).   . If any lack of improvement consider: referal to Trauma Ortho for potential pubic symphysis fusion  . At follow up will plan to consider: repeat corticosteroid injections     CMA/ATC served as scribe during this visit. History, Physical, and Plan performed by medical provider. Documentation and orders reviewed and attested to.      Gerda Diss, [facility] Physician

## 2018-09-30 ENCOUNTER — Ambulatory Visit (INDEPENDENT_AMBULATORY_CARE_PROVIDER_SITE_OTHER): Payer: Medicare HMO | Admitting: Family Medicine

## 2018-09-30 VITALS — BP 96/65 | HR 77 | Temp 97.6°F | Ht 68.0 in | Wt 234.0 lb

## 2018-09-30 DIAGNOSIS — Z6835 Body mass index (BMI) 35.0-35.9, adult: Secondary | ICD-10-CM

## 2018-09-30 DIAGNOSIS — E559 Vitamin D deficiency, unspecified: Secondary | ICD-10-CM | POA: Diagnosis not present

## 2018-09-30 DIAGNOSIS — F3289 Other specified depressive episodes: Secondary | ICD-10-CM | POA: Diagnosis not present

## 2018-09-30 MED ORDER — VITAMIN D (ERGOCALCIFEROL) 1.25 MG (50000 UNIT) PO CAPS: 50000.0000 [IU] | ORAL_CAPSULE | ORAL | 0 refills | Status: DC

## 2018-10-01 NOTE — Progress Notes (Signed)
Office: [phone]  /  Fax: [phone]   HPI:   Chief Complaint: OBESITY Angie Velasquez is here to discuss her progress with her obesity treatment plan. She is on the  follow the Category 2 plan and is following her eating plan approximately 60-70 % of the time. She states she is exercising 0 minutes 0 times per week. Angie likes the Cat 2 meal plan. She eats all her food and is going to visit family in [place] at the end of this week for 10 days. She is planning strategies to stay on the meal plan.  Her weight is 234 lb (106.1 kg) today and maintained since her last visit. She has lost 0 lbs since starting treatment with [name].  Vitamin D deficiency Angie Velasquez has a diagnosis of vitamin D deficiency. She is currently taking vit D and denies nausea, vomiting or muscle weakness.  Depression with emotional eating behaviors Angie Velasquez is struggling with emotional eating and using food for comfort to the extent that it is negatively impacting her Velasquez. She often snacks when she is not hungry. Angie Velasquez sometimes feels she is out of control and then feels guilty that she made poor food choices. She has been working on behavior modification techniques to help reduce her emotional eating and has been somewhat successful. She shows no sign of suicidal or homicidal ideations. Patient is positive for for a dry mouth from Bupropion.   Depression screen Angie Velasquez 2/9 01/10/2018 09/18/2017 09/05/2017 03/26/2017 03/26/2017  Decreased Interest 1 1 2  0 0  Down, Depressed, Hopeless 2 1 2 1  0  PHQ - 2 Score 3 2 4 1  0  Altered sleeping 0 0 0 - -  Tired, decreased energy 0 1 1 - -  Change in appetite 0 1 0 - -  Feeling bad or failure about yourself  0 1 0 - -  Trouble concentrating 0 1 0 - -  Moving slowly or fidgety/restless 0 0 0 - -  Suicidal thoughts 0 1 0 - -  PHQ-9 Score 3 7 5  - -  Difficult doing work/chores Somewhat difficult Not difficult at all Somewhat difficult - -      ALLERGIES: Allergies  Allergen  Reactions  . Epinephrine Palpitations  . Codeine Nausea Only  . Penicillins Nausea Only    MEDICATIONS: Current Outpatient Medications on File Prior to Visit  Medication Sig Dispense Refill  . acetaminophen (TYLENOL) 500 MG tablet Take 500 mg by mouth every 6 (six) hours as needed.    Angie Velasquez Kitchen allopurinol (ZYLOPRIM) 300 MG tablet TAKE 1 TABLET (300 MG TOTAL) ONCE A DAY 90 tablet 3  . aspirin 81 MG tablet Take 81 mg by mouth daily.    Angie Velasquez Kitchen buPROPion (WELLBUTRIN SR) 200 MG 12 hr tablet Take 1 tablet (200 mg total) by mouth daily. 30 tablet 0  . CALCIUM-MAGNESIUM-ZINC PO Take by mouth.    . clonazePAM (KLONOPIN) 0.5 MG tablet Take 1 tablet (0.5 mg total) by mouth at bedtime as needed (sleep. 8 day refill while traveling and awaiting mail order refill.). 8 tablet 0  . diazepam (VALIUM) 10 MG tablet Take 10 mg by mouth every 6 (six) hours as needed (as needed for muscle spasms).    . diclofenac sodium (VOLTAREN) 1 % GEL Apply topically to affected area qid 100 g 2  . gabapentin (NEURONTIN) 300 MG capsule Take 1 capsule, orally, up to TID or as directed 270 capsule 1  . lisinopril (PRINIVIL,ZESTRIL) 2.5 MG tablet TAKE 1 TABLET EVERY DAY 90  tablet 3  . nystatin cream (MYCOSTATIN) Apply 1 application topically 2 (two) times daily. To rash in groin for up to 2 weeks maximum 90 g 2  . OVER THE COUNTER MEDICATION Cherry extract bid    . OVER THE COUNTER MEDICATION B-12 one daily    . OVER THE COUNTER MEDICATION Vitamin D    . polyethylene glycol powder (GLYCOLAX/MIRALAX) powder Take 17 g by mouth daily. 3350 g 0  . sertraline (ZOLOFT) 50 MG tablet TAKE 1 TABLET (50 MG TOTAL) BY MOUTH DAILY. 90 tablet 3  . simvastatin (ZOCOR) 20 MG tablet TAKE 1 TABLET AT BEDTIME 90 tablet 0  . Sodium Bicarbonate (NICE PURE BAKING SODA) POWD by Does not apply route.    . traMADol (ULTRAM) 50 MG tablet Take 1 tablet (50 mg total) by mouth every 6 (six) hours as needed for moderate pain or severe pain. 60 tablet 1  .  triamcinolone cream (KENALOG) 0.1 % Apply 1 application topically 2 (two) times daily. For 7-10 days for recurring eczema issues 80 g 1   No current facility-administered medications on file prior to visit.     PAST MEDICAL HISTORY: Past Medical History:  Diagnosis Date  . Anxiety    years ago - no longer an issue  . Arthritis    gout  . Cataracts, bilateral   . Complication of anesthesia   . Fracture of pelvis ([place])   . GERD (gastroesophageal reflux disease)   . Hx of chest pain    "anxiety"  . Hyperlipidemia   . INSOMNIA 10/14/2008   in past  . Joint pain   . Kidney problem   . Low blood sugar    eats several smaller meals a day  . Obesity   . Palpitations    Stress related   . Proteinuria    H/O  . Tobacco abuse    quit smoking 08/2013    PAST SURGICAL HISTORY: Past Surgical History:  Procedure Laterality Date  . ABDOMINAL HYSTERECTOMY    . APPENDECTOMY     with hysterectomy  . childbirth     x4  . CHOLECYSTECTOMY  2001  . EYE SURGERY Bilateral 2015   cataract surgery with lens implant  . NEPHRECTOMY  1992   complex cystic mass - nephrectomy right  . ORIF ANKLE FRACTURE Right 07/02/2014   Procedure: OPEN REDUCTION INTERNAL FIXATION (ORIF) RIGHT ANKLE BIMALLOELAR FRACTURE;  Surgeon: Wylene Simmer, MD;  Location: [facility];  Service: Orthopedics;  Laterality: Right;  . OTHER SURGICAL HISTORY     hysterectomy, R ovary remains  . SHOULDER SURGERY     bone spurs left    SOCIAL HISTORY: Social History   Tobacco Use  . Smoking status: Former Smoker    Packs/day: 1.00    Years: 35.00    Pack years: 35.00    Types: Cigarettes    Last attempt to quit: 09/01/2013    Years since quitting: 5.0  . Smokeless tobacco: Never Used  . Tobacco comment: Former smoker with no desire to smoke again. Smoking years edited at patients request to include periods when she did not smoke.  Substance Use Topics  . Alcohol use: Yes    Alcohol/week: 1.0 standard drinks    Types: 1  Shots of liquor per week  . Drug use: No    FAMILY HISTORY: Family History  Problem Relation Age of Onset  . Stroke Father   . Alzheimer's disease Father        early,  states also parkinsons. died 12  . Hypertension Father   . Obesity Father   . Ovarian cancer Mother        Dr. Jana Hakim   . Arthritis Mother   . Obesity Mother   . Colon cancer Paternal Grandmother   . Liver disease Daughter        On hospice age 11    ROS: Review of Systems  All other systems reviewed and are negative.   PHYSICAL EXAM: Blood pressure 96/65, pulse 77, temperature 97.6 F (36.4 C), temperature source Oral, height 5\' 8"  (1.727 m), weight 234 lb (106.1 kg), SpO2 96 %. Body mass index is 35.58 kg/m. Physical Exam  Constitutional: She is oriented to person, place, and time. She appears well-developed and well-nourished.  HENT:  Head: Normocephalic.  Eyes: Pupils are equal, round, and reactive to light.  Neck: Normal range of motion.  Pulmonary/Chest: Effort normal.  Musculoskeletal: Normal range of motion.  Neurological: She is alert and oriented to person, place, and time.  Skin: Skin is warm and dry.  Psychiatric: She has a normal mood and affect. Her behavior is normal.  Vitals reviewed.   RECENT LABS AND TESTS: BMET    Component Value Date/Time   NA 141 07/25/2018 0836   K 4.1 07/25/2018 0836   CL 103 07/25/2018 0836   CO2 22 07/25/2018 0836   GLUCOSE 98 07/25/2018 0836   GLUCOSE 109 (H) 01/18/2017 0902   BUN 17 07/25/2018 0836   CREATININE 1.21 (H) 07/25/2018 0836   CALCIUM 9.3 07/25/2018 0836   GFRNONAA 45 (L) 07/25/2018 0836   GFRAA 52 (L) 07/25/2018 0836   Lab Results  Component Value Date   HGBA1C 5.8 (H) 07/25/2018   HGBA1C 5.7 (H) 09/18/2017   HGBA1C (H) 04/15/2010    5.9 (NOTE)                                                                       According to the ADA Clinical Practice Recommendations for 2011, when HbA1c is used as a screening test:   >=6.5%    Diagnostic of Diabetes Mellitus           (if abnormal result  is confirmed)  5.7-6.4%   Increased risk of developing Diabetes Mellitus  References:Diagnosis and Classification of Diabetes Mellitus,Diabetes HYIF,0277,41(OINOM 1):S62-S69 and Standards of Medical Care in         Diabetes - 2011,Diabetes Care,2011,34  (Suppl 1):S11-S61.   HGBA1C 5.8 12/31/2007   Lab Results  Component Value Date   INSULIN 22.0 07/25/2018   INSULIN 25.3 (H) 09/18/2017   CBC    Component Value Date/Time   WBC 6.4 09/18/2017 1049   WBC 6.6 01/18/2017 0902   RBC 4.57 09/18/2017 1049   RBC 4.39 01/18/2017 0902   HGB 13.5 09/18/2017 1049   HCT 40.4 09/18/2017 1049   PLT 240.0 01/18/2017 0902   MCV 88 09/18/2017 1049   MCH 29.5 09/18/2017 1049   MCH 29.4 07/02/2014 1143   MCHC 33.4 09/18/2017 1049   MCHC 33.2 01/18/2017 0902   RDW 15.2 09/18/2017 1049   LYMPHSABS 2.6 09/18/2017 1049   MONOABS 0.9 04/15/2010 2135   EOSABS 0.2 09/18/2017 1049   BASOSABS 0.0 09/18/2017 1049  Iron/TIBC/Ferritin/ %Sat No results found for: IRON, TIBC, FERRITIN, IRONPCTSAT Lipid Panel     Component Value Date/Time   CHOL 200 (H) 07/25/2018 0836   TRIG 164 (H) 07/25/2018 0836   HDL 68 07/25/2018 0836   CHOLHDL 2 01/18/2017 0902   VLDL 28.2 01/18/2017 0902   LDLCALC 99 07/25/2018 0836   LDLDIRECT 77.0 01/18/2017 0902   Hepatic Function Panel     Component Value Date/Time   PROT 6.5 07/25/2018 0836   ALBUMIN 4.5 07/25/2018 0836   AST 17 07/25/2018 0836   ALT 17 07/25/2018 0836   ALKPHOS 99 07/25/2018 0836   BILITOT 0.2 07/25/2018 0836   BILIDIR <0.1 04/15/2010 2135   IBILI NOT CALCULATED 04/15/2010 2135      Component Value Date/Time   TSH 3.430 09/18/2017 1049   TSH 2.09 05/25/2015 0820   TSH 1.89 12/31/2007 0831    ASSESSMENT AND PLAN: Vitamin D deficiency - Plan: Vitamin D, Ergocalciferol, (DRISDOL) 50000 units CAPS capsule  Other depression - with emotional eating  Class 2 severe obesity with  serious comorbidity and body mass index (BMI) of 35.0 to 35.9 in adult, unspecified obesity type (Angie Velasquez)  PLAN: Vitamin D Deficiency Angie Velasquez was informed that low vitamin D levels contributes to fatigue and are associated with obesity, breast, and colon cancer. She agrees to continue to take prescription Vit D @50 ,000 IU every week in which a prescription was written today for a 30 day supply and will follow up for routine testing of vitamin D, at least 2-3 times per year. She was informed of the risk of over-replacement of vitamin D and agrees to not increase her dose unless she discusses this with [name] first.  Depression with emotional eating behaviors Angie Velasquez is struggling with emotional eating and using food for comfort to the extent that it is negatively impacting her Velasquez. She often snacks when she is not hungry. Angie Velasquez sometimes feels she is out of control and then feels guilty that she made poor food choices. She has been working on behavior modification techniques to help reduce her emotional eating and has been somewhat successful. She shows no sign of suicidal or homicidal ideations. Will continue the bupropion at this time.   Depression screen Angie Velasquez 2/9 01/10/2018 09/18/2017 09/05/2017 03/26/2017 03/26/2017  Decreased Interest 1 1 2  0 0  Down, Depressed, Hopeless 2 1 2 1  0  PHQ - 2 Score 3 2 4 1  0  Altered sleeping 0 0 0 - -  Tired, decreased energy 0 1 1 - -  Change in appetite 0 1 0 - -  Feeling bad or failure about yourself  0 1 0 - -  Trouble concentrating 0 1 0 - -  Moving slowly or fidgety/restless 0 0 0 - -  Suicidal thoughts 0 1 0 - -  PHQ-9 Score 3 7 5  - -  Difficult doing work/chores Somewhat difficult Not difficult at all Somewhat difficult - -    Obesity Angie Velasquez is currently in the action stage of change. As such, her goal is to continue with weight loss efforts She has agreed to follow the Category 2 plan Angie Velasquez has been instructed to work up to a goal of 150 minutes of  combined cardio and strengthening exercise per week for weight loss and overall Velasquez benefits. Patient struggles with pelvic pain.  We discussed the following Behavioral Modification Stratagies today: increase her H20 intake and use traveling eating strategies.    Angie Velasquez has agreed to follow up with our  clinic in 3 weeks. She was informed of the importance of frequent follow up visits to maximize her success with intensive lifestyle modifications for her multiple Velasquez conditions.   OBESITY BEHAVIORAL INTERVENTION VISIT  Today's visit was # 18   Starting weight: 249 lbs Starting date: 09/18/17 Today's weight : Weight: 234 lb (106.1 kg)  Today's date: 09/30/18 Total lbs lost to date: 15    ASK: We discussed the diagnosis of obesity with Angie Velasquez today and Angie Velasquez agreed to give [name] permission to discuss obesity behavioral modification therapy today.  ASSESS: Angie Velasquez has the diagnosis of obesity and her BMI today is 35.7 Angie Velasquez is in the action stage of change   ADVISE: Angie Velasquez was educated on the multiple Velasquez risks of obesity as well as the benefit of weight loss to improve her Velasquez. She was advised of the need for long term treatment and the importance of lifestyle modifications to improve her current Velasquez and to decrease her risk of future Velasquez problems.  AGREE: Multiple dietary modification options and treatment options were discussed and  Angie Velasquez agreed to follow the recommendations documented in the above note.  ARRANGE: Angie Velasquez was educated on the importance of frequent visits to treat obesity as outlined per CMS and USPSTF guidelines and agreed to schedule her next follow up appointment today.  I, April Moore, am acting as Location manager for Dr Dennard Nip.   I have reviewed the above documentation for accuracy and completeness, and I agree with the above. -Dennard Nip, MD

## 2018-10-02 ENCOUNTER — Other Ambulatory Visit (INDEPENDENT_AMBULATORY_CARE_PROVIDER_SITE_OTHER): Payer: Self-pay | Admitting: Family Medicine

## 2018-10-02 DIAGNOSIS — E559 Vitamin D deficiency, unspecified: Secondary | ICD-10-CM

## 2018-10-17 ENCOUNTER — Encounter: Payer: Self-pay | Admitting: Family Medicine

## 2018-10-21 ENCOUNTER — Encounter (INDEPENDENT_AMBULATORY_CARE_PROVIDER_SITE_OTHER): Payer: Self-pay

## 2018-10-21 ENCOUNTER — Ambulatory Visit (INDEPENDENT_AMBULATORY_CARE_PROVIDER_SITE_OTHER): Payer: Medicare HMO | Admitting: Family Medicine

## 2018-10-28 ENCOUNTER — Other Ambulatory Visit: Payer: Self-pay

## 2018-10-28 DIAGNOSIS — M79673 Pain in unspecified foot: Secondary | ICD-10-CM

## 2018-10-28 DIAGNOSIS — M722 Plantar fascial fibromatosis: Secondary | ICD-10-CM

## 2018-11-05 ENCOUNTER — Encounter (INDEPENDENT_AMBULATORY_CARE_PROVIDER_SITE_OTHER): Payer: Self-pay | Admitting: Family Medicine

## 2018-11-05 ENCOUNTER — Other Ambulatory Visit: Payer: Self-pay | Admitting: Family Medicine

## 2018-11-06 MED ORDER — CLONAZEPAM 0.5 MG PO TABS: 0.5000 mg | ORAL_TABLET | Freq: Every evening | ORAL | 1 refills | Status: DC | PRN

## 2018-11-06 NOTE — Telephone Encounter (Signed)
In addition to refill request, pt also wondering if she needs to see Dr. Yong Channel, she says there has been nothing wrong with her and she has been seeing Dr. Leafy Ro regularly.

## 2018-11-06 NOTE — Telephone Encounter (Signed)
Prescription was sent to pharmacy this morning

## 2018-11-06 NOTE — Telephone Encounter (Signed)
Please see msg

## 2018-11-06 NOTE — Telephone Encounter (Signed)
In addition to refill request, pt also wondering if she needs to see Dr. Yong Channel for an appointment, she says there has been nothing wrong with her and she has been seeing Dr. Leafy Ro regularly.

## 2018-11-13 ENCOUNTER — Ambulatory Visit (INDEPENDENT_AMBULATORY_CARE_PROVIDER_SITE_OTHER): Payer: Medicare HMO | Admitting: Family Medicine

## 2018-11-13 ENCOUNTER — Encounter (INDEPENDENT_AMBULATORY_CARE_PROVIDER_SITE_OTHER): Payer: Self-pay | Admitting: Family Medicine

## 2018-11-13 ENCOUNTER — Encounter: Payer: Self-pay | Admitting: Family Medicine

## 2018-11-13 VITALS — BP 101/65 | HR 76 | Ht 68.0 in | Wt 239.0 lb

## 2018-11-13 DIAGNOSIS — E559 Vitamin D deficiency, unspecified: Secondary | ICD-10-CM

## 2018-11-13 DIAGNOSIS — F3289 Other specified depressive episodes: Secondary | ICD-10-CM

## 2018-11-13 DIAGNOSIS — Z6836 Body mass index (BMI) 36.0-36.9, adult: Secondary | ICD-10-CM | POA: Diagnosis not present

## 2018-11-13 MED ORDER — BUPROPION HCL ER (SR) 200 MG PO TB12: 200.0000 mg | ORAL_TABLET | Freq: Every day | ORAL | 0 refills | Status: DC

## 2018-11-13 MED ORDER — VITAMIN D (ERGOCALCIFEROL) 1.25 MG (50000 UNIT) PO CAPS: 50000.0000 [IU] | ORAL_CAPSULE | ORAL | 0 refills | Status: DC

## 2018-11-13 NOTE — Progress Notes (Signed)
Office: [phone]  /  Fax: [phone]   HPI:   Chief Complaint: OBESITY Angie Velasquez is here to discuss her progress with her obesity treatment plan. She is on the Category 2 plan and is following her eating plan approximately 50 % of the time. She states she is exercising 0 minutes 0 times per week. Angie Velasquez increased traveling over Thanksgiving and had to increase eating out. She also had increased emotional eating. She is mostly portion control and smarter choices, but is unhappy with the weight gain. She is not at a point where she can follow a structured plan.   Her weight is 239 lb (108.4 kg) today and has had a weight gain of 5 pounds over a period of 6 weeks since her last visit. She has lost 10 lbs since starting treatment with [name].  Vitamin D deficiency Angie Velasquez has a diagnosis of vitamin D deficiency. She is currently taking vit D and is stable, but not yet at goal. She denies nausea, vomiting, or muscle weakness.  Depression with emotional eating behaviors Angie Velasquez is struggling with emotional eating and using food for comfort to the extent that it is negatively impacting her health. She often snacks when she is not hungry. Angie Velasquez sometimes feels she is out of control and then feels guilty that she made poor food choices. She has been working on behavior modification techniques to help reduce her emotional eating and has been somewhat successful. Her mood is stable, but she is still using food for emotional eating. She is unhappy in her marriage and this affects multiple aspects of her life and health. She shows no sign of suicidal or homicidal ideations.  ALLERGIES: Allergies  Allergen Reactions  . Epinephrine Palpitations  . Codeine Nausea Only  . Penicillins Nausea Only    MEDICATIONS: Current Outpatient Medications on File Prior to Visit  Medication Sig Dispense Refill  . acetaminophen (TYLENOL) 500 MG tablet Take 500 mg by mouth every 6 (six) hours as needed.    [name]  allopurinol (ZYLOPRIM) 300 MG tablet TAKE 1 TABLET (300 MG TOTAL) ONCE A DAY 90 tablet 3  . aspirin 81 MG tablet Take 81 mg by mouth daily.    [name] CALCIUM-MAGNESIUM-ZINC PO Take by mouth.    . clonazePAM (KLONOPIN) 0.5 MG tablet Take 1 tablet (0.5 mg total) by mouth at bedtime as needed. 90 tablet 1  . diazepam (VALIUM) 10 MG tablet Take 10 mg by mouth every 6 (six) hours as needed (as needed for muscle spasms).    . diclofenac sodium (VOLTAREN) 1 % GEL Apply topically to affected area qid 100 g 2  . gabapentin (NEURONTIN) 300 MG capsule Take 1 capsule, orally, up to TID or as directed 270 capsule 1  . lisinopril (PRINIVIL,ZESTRIL) 2.5 MG tablet TAKE 1 TABLET EVERY DAY 90 tablet 3  . nystatin cream (MYCOSTATIN) Apply 1 application topically 2 (two) times daily. To rash in groin for up to 2 weeks maximum 90 g 2  . OVER THE COUNTER MEDICATION Cherry extract bid    . OVER THE COUNTER MEDICATION B-12 one daily    . OVER THE COUNTER MEDICATION Vitamin D    . polyethylene glycol powder (GLYCOLAX/MIRALAX) powder Take 17 g by mouth daily. 3350 g 0  . sertraline (ZOLOFT) 50 MG tablet TAKE 1 TABLET (50 MG TOTAL) BY MOUTH DAILY. 90 tablet 3  . simvastatin (ZOCOR) 20 MG tablet TAKE 1 TABLET AT BEDTIME 90 tablet 0  . Sodium Bicarbonate (NICE PURE BAKING SODA)  POWD by Does not apply route.    . traMADol (ULTRAM) 50 MG tablet Take 1 tablet (50 mg total) by mouth every 6 (six) hours as needed for moderate pain or severe pain. 60 tablet 1  . triamcinolone cream (KENALOG) 0.1 % Apply 1 application topically 2 (two) times daily. For 7-10 days for recurring eczema issues 80 g 1   No current facility-administered medications on file prior to visit.     PAST MEDICAL HISTORY: Past Medical History:  Diagnosis Date  . Anxiety    years ago - no longer an issue  . Arthritis    gout  . Cataracts, bilateral   . Complication of anesthesia   . Fracture of pelvis ([place])   . GERD (gastroesophageal reflux disease)   .  Hx of chest pain    "anxiety"  . Hyperlipidemia   . INSOMNIA 10/14/2008   in past  . Joint pain   . Kidney problem   . Low blood sugar    eats several smaller meals a day  . Obesity   . Palpitations    Stress related   . Proteinuria    H/O  . Tobacco abuse    quit smoking 08/2013    PAST SURGICAL HISTORY: Past Surgical History:  Procedure Laterality Date  . ABDOMINAL HYSTERECTOMY    . APPENDECTOMY     with hysterectomy  . childbirth     x4  . CHOLECYSTECTOMY  2001  . EYE SURGERY Bilateral 2015   cataract surgery with lens implant  . NEPHRECTOMY  1992   complex cystic mass - nephrectomy right  . ORIF ANKLE FRACTURE Right 07/02/2014   Procedure: OPEN REDUCTION INTERNAL FIXATION (ORIF) RIGHT ANKLE BIMALLOELAR FRACTURE;  Surgeon: Wylene Simmer, MD;  Location: [facility];  Service: Orthopedics;  Laterality: Right;  . OTHER SURGICAL HISTORY     hysterectomy, R ovary remains  . SHOULDER SURGERY     bone spurs left    SOCIAL HISTORY: Social History   Tobacco Use  . Smoking status: Former Smoker    Packs/day: 1.00    Years: 35.00    Pack years: 35.00    Types: Cigarettes    Last attempt to quit: 09/01/2013    Years since quitting: 5.2  . Smokeless tobacco: Never Used  . Tobacco comment: Former smoker with no desire to smoke again. Smoking years edited at patients request to include periods when she did not smoke.  Substance Use Topics  . Alcohol use: Yes    Alcohol/week: 1.0 standard drinks    Types: 1 Shots of liquor per week  . Drug use: No    FAMILY HISTORY: Family History  Problem Relation Age of Onset  . Stroke Father   . Alzheimer's disease Father        early, states also parkinsons. died 26  . Hypertension Father   . Obesity Father   . Ovarian cancer Mother        Dr. Jana Hakim   . Arthritis Mother   . Obesity Mother   . Colon cancer Paternal Grandmother   . Liver disease Daughter        On hospice age 65    ROS: Review of Systems  Constitutional:  Negative for weight loss.  Gastrointestinal: Negative for nausea and vomiting.  Musculoskeletal:       Negative for muscle weakness.  Psychiatric/Behavioral: Positive for depression. Negative for suicidal ideas.       Negative for homicidal ideations.    PHYSICAL  EXAM: Blood pressure 101/65, pulse 76, height 5\' 8"  (1.727 m), weight 239 lb (108.4 kg), SpO2 96 %. Body mass index is 36.34 kg/m. Physical Exam  Constitutional: She is oriented to person, place, and time. She appears well-developed and well-nourished.  Cardiovascular: Normal rate.  Pulmonary/Chest: Effort normal.  Musculoskeletal: Normal range of motion.  Neurological: She is oriented to person, place, and time.  Skin: Skin is warm and dry.  Psychiatric: She has a normal mood and affect. Her behavior is normal.  Vitals reviewed.   RECENT LABS AND TESTS: BMET    Component Value Date/Time   NA 141 07/25/2018 0836   K 4.1 07/25/2018 0836   CL 103 07/25/2018 0836   CO2 22 07/25/2018 0836   GLUCOSE 98 07/25/2018 0836   GLUCOSE 109 (H) 01/18/2017 0902   BUN 17 07/25/2018 0836   CREATININE 1.21 (H) 07/25/2018 0836   CALCIUM 9.3 07/25/2018 0836   GFRNONAA 45 (L) 07/25/2018 0836   GFRAA 52 (L) 07/25/2018 0836   Lab Results  Component Value Date   HGBA1C 5.8 (H) 07/25/2018   HGBA1C 5.7 (H) 09/18/2017   HGBA1C (H) 04/15/2010    5.9 (NOTE)                                                                       According to the ADA Clinical Practice Recommendations for 2011, when HbA1c is used as a screening test:   >=6.5%   Diagnostic of Diabetes Mellitus           (if abnormal result  is confirmed)  5.7-6.4%   Increased risk of developing Diabetes Mellitus  References:Diagnosis and Classification of Diabetes Mellitus,Diabetes YHCW,2376,28(BTDVV 1):S62-S69 and Standards of Medical Care in         Diabetes - 2011,Diabetes Care,2011,34  (Suppl 1):S11-S61.   HGBA1C 5.8 12/31/2007   Lab Results  Component Value Date    INSULIN 22.0 07/25/2018   INSULIN 25.3 (H) 09/18/2017   CBC    Component Value Date/Time   WBC 6.4 09/18/2017 1049   WBC 6.6 01/18/2017 0902   RBC 4.57 09/18/2017 1049   RBC 4.39 01/18/2017 0902   HGB 13.5 09/18/2017 1049   HCT 40.4 09/18/2017 1049   PLT 240.0 01/18/2017 0902   MCV 88 09/18/2017 1049   MCH 29.5 09/18/2017 1049   MCH 29.4 07/02/2014 1143   MCHC 33.4 09/18/2017 1049   MCHC 33.2 01/18/2017 0902   RDW 15.2 09/18/2017 1049   LYMPHSABS 2.6 09/18/2017 1049   MONOABS 0.9 04/15/2010 2135   EOSABS 0.2 09/18/2017 1049   BASOSABS 0.0 09/18/2017 1049   Iron/TIBC/Ferritin/ %Sat No results found for: IRON, TIBC, FERRITIN, IRONPCTSAT Lipid Panel     Component Value Date/Time   CHOL 200 (H) 07/25/2018 0836   TRIG 164 (H) 07/25/2018 0836   HDL 68 07/25/2018 0836   CHOLHDL 2 01/18/2017 0902   VLDL 28.2 01/18/2017 0902   LDLCALC 99 07/25/2018 0836   LDLDIRECT 77.0 01/18/2017 0902   Hepatic Function Panel     Component Value Date/Time   PROT 6.5 07/25/2018 0836   ALBUMIN 4.5 07/25/2018 0836   AST 17 07/25/2018 0836   ALT 17 07/25/2018 0836   ALKPHOS 99 07/25/2018 0836   BILITOT  0.2 07/25/2018 0836   BILIDIR <0.1 04/15/2010 2135   IBILI NOT CALCULATED 04/15/2010 2135      Component Value Date/Time   TSH 3.430 09/18/2017 1049   TSH 2.09 05/25/2015 0820   TSH 1.89 12/31/2007 0831   Results for Judie Bonus ELIZABETH "[name]" (MRN [id number]) as of 11/13/2018 15:19  Ref. Range 07/25/2018 08:36  Vitamin D, 25-Hydroxy Latest Ref Range: 30.0 - 100.0 ng/mL 39.1   ASSESSMENT AND PLAN: Vitamin D deficiency - Plan: Vitamin D, Ergocalciferol, (DRISDOL) 1.25 MG (50000 UT) CAPS capsule  Other depression - with emotional eating - Plan: buPROPion (WELLBUTRIN SR) 200 MG 12 hr tablet  Class 2 severe obesity with serious comorbidity and body mass index (BMI) of 36.0 to 36.9 in adult, unspecified obesity type ([place])  PLAN:  Vitamin D Deficiency Angie Velasquez was informed that low  vitamin D levels contributes to fatigue and are associated with obesity, breast, and colon cancer. She agrees to continue to take prescription Vit D @50 ,000 IU every week #4 with no refills and will follow up for routine testing of vitamin D, at least 2-3 times per year. She was informed of the risk of over-replacement of vitamin D and agrees to not increase her dose unless she discusses this with [name] first. Angie Velasquez agreed to follow up in 4 weeks.  Depression with Emotional Eating Behaviors We discussed behavior modification techniques today to help Cadey deal with her emotional eating and depression. She has agreed to take Wellbutrin SR 200mg  qd #30 with no refills and agreed to follow up as directed.  Obesity Angie Velasquez is currently in the action stage of change. As such, her goal is to maintain weight for now over the holidays. She has agreed to portion control better and make smarter food choices, such as increase vegetables and decrease simple carbohydrates.  Angie Velasquez has been instructed to work up to a goal of 150 minutes of combined cardio and strengthening exercise per week for weight loss and overall health benefits. We discussed the following Behavioral Modification Strategies today: increasing lean protein intake, work on meal planning and easy cooking plans, holiday eating strategies, celebration eating strategies, and emotional eating strategies.  Angie Velasquez has agreed to follow up with our clinic in 4 weeks. She was informed of the importance of frequent follow up visits to maximize her success with intensive lifestyle modifications for her multiple health conditions.   OBESITY BEHAVIORAL INTERVENTION VISIT  Today's visit was # 19  Starting weight: 249 lbs Starting date: 09/18/17 Today's weight : Weight: 239 lb (108.4 kg)  Today's date: 11/13/2018 Total lbs lost to date: 10 At least 15 minutes were spent on discussing the following behavioral intervention visit.  ASK: We discussed  the diagnosis of obesity with Angie Velasquez today and Angie Velasquez agreed to give [name] permission to discuss obesity behavioral modification therapy today.  ASSESS: Angie Velasquez has the diagnosis of obesity and her BMI today is 36.3 Angie Velasquez is in the action stage of change.   ADVISE: Angie Velasquez was educated on the multiple health risks of obesity as well as the benefit of weight loss to improve her health. She was advised of the need for long term treatment and the importance of lifestyle modifications to improve her current health and to decrease her risk of future health problems.  AGREE: Multiple dietary modification options and treatment options were discussed and Angie Velasquez agreed to follow the recommendations documented in the above note.  ARRANGE: Angie Velasquez was educated on the importance of frequent visits to treat  obesity as outlined per CMS and USPSTF guidelines and agreed to schedule her next follow up appointment today.  I, Marcille Blanco, am acting as transcriptionist for [facility], MD  I have reviewed the above documentation for accuracy and completeness, and I agree with the above. -Dennard Nip, MD

## 2018-11-18 ENCOUNTER — Other Ambulatory Visit: Payer: Self-pay

## 2018-11-18 ENCOUNTER — Encounter: Payer: Self-pay | Admitting: Family Medicine

## 2018-11-18 DIAGNOSIS — M2170 Unequal limb length (acquired), unspecified site: Secondary | ICD-10-CM | POA: Diagnosis not present

## 2018-11-18 MED ORDER — ALLOPURINOL 300 MG PO TABS: 300.0000 mg | ORAL_TABLET | Freq: Two times a day (BID) | ORAL | 3 refills | Status: DC

## 2018-11-29 ENCOUNTER — Other Ambulatory Visit: Payer: Self-pay

## 2018-11-29 MED ORDER — ALLOPURINOL 300 MG PO TABS: 300.0000 mg | ORAL_TABLET | Freq: Two times a day (BID) | ORAL | 3 refills | Status: DC

## 2018-12-09 ENCOUNTER — Encounter: Payer: Self-pay | Admitting: Family Medicine

## 2018-12-10 ENCOUNTER — Encounter (INDEPENDENT_AMBULATORY_CARE_PROVIDER_SITE_OTHER): Payer: Self-pay

## 2018-12-10 ENCOUNTER — Ambulatory Visit (INDEPENDENT_AMBULATORY_CARE_PROVIDER_SITE_OTHER): Payer: Medicare HMO | Admitting: Family Medicine

## 2018-12-16 ENCOUNTER — Encounter (INDEPENDENT_AMBULATORY_CARE_PROVIDER_SITE_OTHER): Payer: Self-pay | Admitting: Family Medicine

## 2018-12-16 ENCOUNTER — Ambulatory Visit (INDEPENDENT_AMBULATORY_CARE_PROVIDER_SITE_OTHER): Payer: Medicare HMO | Admitting: Family Medicine

## 2018-12-16 VITALS — BP 97/64 | HR 78 | Temp 97.4°F | Ht 68.0 in | Wt 239.0 lb

## 2018-12-16 DIAGNOSIS — Z6836 Body mass index (BMI) 36.0-36.9, adult: Secondary | ICD-10-CM

## 2018-12-16 DIAGNOSIS — E559 Vitamin D deficiency, unspecified: Secondary | ICD-10-CM

## 2018-12-16 DIAGNOSIS — I10 Essential (primary) hypertension: Secondary | ICD-10-CM | POA: Diagnosis not present

## 2018-12-17 NOTE — Progress Notes (Signed)
Office: [phone]  /  Fax: [phone]   HPI:   Chief Complaint: OBESITY Nimco is here to discuss her progress with her obesity treatment plan. She is on the portion control better and make smarter food choices, such as increase vegetables and decrease simple carbohydrates and is following her eating plan approximately 20 % of the time. She states she is exercising 0 minutes 0 times per week. Luxe overindulged during the holiday season, but wants to get back on track. She is feeling hungry around 4 pm, and if she eats early then she is hungry around 9:30/10 pm.  Her weight is 239 lb (108.4 kg) today and has not lost weight since her last visit. She has lost 10 lbs since starting treatment with [name].  Hypertension Francyne Arreaga is a 73 y.o. female with hypertension. Blaklee's blood pressure is well controlled. She denies dizziness or lightheadedness. She is working weight loss to help control her blood pressure with the goal of decreasing her risk of heart attack and stroke.  Vitamin D Deficiency Jayliani has a diagnosis of vitamin D deficiency. She is currently taking prescription Vit D. She notes fatigue and denies nausea, vomiting or muscle weakness.  ASSESSMENT AND PLAN:  Essential hypertension  Vitamin D deficiency  Class 2 severe obesity with serious comorbidity and body mass index (BMI) of 36.0 to 36.9 in adult, unspecified obesity type ([place])  PLAN:  Hypertension We discussed sodium restriction, working on healthy weight loss, and a regular exercise program as the means to achieve improved blood pressure control. Tayloranne agreed with this plan and agreed to follow up as directed. We will continue to monitor her blood pressure as well as her progress with the above lifestyle modifications. Mela agrees to continue her medications and will watch for signs of hypotension as she continues her lifestyle modifications. We will follow up on blood pressure at next  appointment, no change in medications at this time. Ichelle agrees to follow up with our clinic in 2 weeks.  Vitamin D Deficiency Kassadee was informed that low vitamin D levels contributes to fatigue and are associated with obesity, breast, and colon cancer. Yazlyn agrees to continue taking prescription Vit D @50 ,000 IU every week, no refill needed. She will follow up for routine testing of vitamin D, at least 2-3 times per year. She was informed of the risk of over-replacement of vitamin D and agrees to not increase her dose unless she discusses this with [name] first. We will recheck Vit D at next appointment. Yarel agrees to follow up with our clinic in 2 weeks.  I spent > than 50% of the 15 minute visit on counseling as documented in the note.  Obesity Jnya is currently in the action stage of change. As such, her goal is to continue with weight loss efforts She has agreed to portion control better and make smarter food choices, such as increase vegetables and decrease simple carbohydrates  and follow the Category 2 plan Sundeep has been instructed to work up to a goal of 150 minutes of combined cardio and strengthening exercise per week for weight loss and overall health benefits. We discussed the following Behavioral Modification Strategies today: increasing lean protein intake, increasing vegetables, work on meal planning and easy cooking plans, better snacking choices, travel eating strategies, and planning for success    Marysa has agreed to follow up with our clinic in 2 weeks. She was informed of the importance of frequent follow up visits to maximize her  success with intensive lifestyle modifications for her multiple health conditions.  ALLERGIES: Allergies  Allergen Reactions  . Epinephrine Palpitations  . Codeine Nausea Only  . Penicillins Nausea Only    MEDICATIONS: Current Outpatient Medications on File Prior to Visit  Medication Sig Dispense Refill  . acetaminophen  (TYLENOL) 500 MG tablet Take 500 mg by mouth every 6 (six) hours as needed.    [name] allopurinol (ZYLOPRIM) 300 MG tablet Take 1 tablet (300 mg total) by mouth 2 (two) times daily. 180 tablet 3  . aspirin 81 MG tablet Take 81 mg by mouth daily.    [name] buPROPion (WELLBUTRIN SR) 200 MG 12 hr tablet Take 1 tablet (200 mg total) by mouth daily. 30 tablet 0  . CALCIUM-MAGNESIUM-ZINC PO Take by mouth.    . clonazePAM (KLONOPIN) 0.5 MG tablet Take 1 tablet (0.5 mg total) by mouth at bedtime as needed. 90 tablet 1  . diazepam (VALIUM) 10 MG tablet Take 10 mg by mouth every 6 (six) hours as needed (as needed for muscle spasms).    . diclofenac sodium (VOLTAREN) 1 % GEL Apply topically to affected area qid 100 g 2  . gabapentin (NEURONTIN) 300 MG capsule Take 1 capsule, orally, up to TID or as directed 270 capsule 1  . lisinopril (PRINIVIL,ZESTRIL) 2.5 MG tablet TAKE 1 TABLET EVERY DAY 90 tablet 3  . nystatin cream (MYCOSTATIN) Apply 1 application topically 2 (two) times daily. To rash in groin for up to 2 weeks maximum 90 g 2  . OVER THE COUNTER MEDICATION Cherry extract bid    . OVER THE COUNTER MEDICATION B-12 one daily    . OVER THE COUNTER MEDICATION Vitamin D    . polyethylene glycol powder (GLYCOLAX/MIRALAX) powder Take 17 g by mouth daily. 3350 g 0  . sertraline (ZOLOFT) 50 MG tablet TAKE 1 TABLET (50 MG TOTAL) BY MOUTH DAILY. 90 tablet 3  . simvastatin (ZOCOR) 20 MG tablet TAKE 1 TABLET AT BEDTIME 90 tablet 0  . Sodium Bicarbonate (NICE PURE BAKING SODA) POWD by Does not apply route.    . traMADol (ULTRAM) 50 MG tablet Take 1 tablet (50 mg total) by mouth every 6 (six) hours as needed for moderate pain or severe pain. 60 tablet 1  . triamcinolone cream (KENALOG) 0.1 % Apply 1 application topically 2 (two) times daily. For 7-10 days for recurring eczema issues 80 g 1  . Vitamin D, Ergocalciferol, (DRISDOL) 1.25 MG (50000 UT) CAPS capsule Take 1 capsule (50,000 Units total) by mouth every 7 (seven) days.  4 capsule 0   No current facility-administered medications on file prior to visit.     PAST MEDICAL HISTORY: Past Medical History:  Diagnosis Date  . Anxiety    years ago - no longer an issue  . Arthritis    gout  . Cataracts, bilateral   . Complication of anesthesia   . Fracture of pelvis ([place])   . GERD (gastroesophageal reflux disease)   . Hx of chest pain    "anxiety"  . Hyperlipidemia   . INSOMNIA 10/14/2008   in past  . Joint pain   . Kidney problem   . Low blood sugar    eats several smaller meals a day  . Obesity   . Palpitations    Stress related   . Proteinuria    H/O  . Tobacco abuse    quit smoking 08/2013    PAST SURGICAL HISTORY: Past Surgical History:  Procedure Laterality Date  .  ABDOMINAL HYSTERECTOMY    . APPENDECTOMY     with hysterectomy  . childbirth     x4  . CHOLECYSTECTOMY  2001  . EYE SURGERY Bilateral 2015   cataract surgery with lens implant  . NEPHRECTOMY  1992   complex cystic mass - nephrectomy right  . ORIF ANKLE FRACTURE Right 07/02/2014   Procedure: OPEN REDUCTION INTERNAL FIXATION (ORIF) RIGHT ANKLE BIMALLOELAR FRACTURE;  Surgeon: Wylene Simmer, MD;  Location: [facility];  Service: Orthopedics;  Laterality: Right;  . OTHER SURGICAL HISTORY     hysterectomy, R ovary remains  . SHOULDER SURGERY     bone spurs left    SOCIAL HISTORY: Social History   Tobacco Use  . Smoking status: Former Smoker    Packs/day: 1.00    Years: 35.00    Pack years: 35.00    Types: Cigarettes    Last attempt to quit: 09/01/2013    Years since quitting: 5.2  . Smokeless tobacco: Never Used  . Tobacco comment: Former smoker with no desire to smoke again. Smoking years edited at patients request to include periods when she did not smoke.  Substance Use Topics  . Alcohol use: Yes    Alcohol/week: 1.0 standard drinks    Types: 1 Shots of liquor per week  . Drug use: No    FAMILY HISTORY: Family History  Problem Relation Age of Onset  . Stroke  Father   . Alzheimer's disease Father        early, states also parkinsons. died 59  . Hypertension Father   . Obesity Father   . Ovarian cancer Mother        Dr. Jana Hakim   . Arthritis Mother   . Obesity Mother   . Colon cancer Paternal Grandmother   . Liver disease Daughter        On hospice age 38    ROS: Review of Systems  Constitutional: Positive for malaise/fatigue. Negative for weight loss.  Gastrointestinal: Negative for nausea and vomiting.  Musculoskeletal:       Negative muscle weakness  Neurological: Negative for dizziness.       Negative lightheadedness    PHYSICAL EXAM: Blood pressure 97/64, pulse 78, temperature (!) 97.4 F (36.3 C), temperature source Oral, height 5\' 8"  (1.727 m), weight 239 lb (108.4 kg), SpO2 98 %. Body mass index is 36.34 kg/m. Physical Exam Vitals signs reviewed.  Constitutional:      Appearance: Normal appearance. She is obese.  Cardiovascular:     Rate and Rhythm: Normal rate.     Pulses: Normal pulses.  Pulmonary:     Effort: Pulmonary effort is normal.     Breath sounds: Normal breath sounds.  Musculoskeletal: Normal range of motion.  Skin:    General: Skin is warm and dry.  Neurological:     Mental Status: She is alert and oriented to person, place, and time.  Psychiatric:        Mood and Affect: Mood normal.        Behavior: Behavior normal.     RECENT LABS AND TESTS: BMET    Component Value Date/Time   NA 141 07/25/2018 0836   K 4.1 07/25/2018 0836   CL 103 07/25/2018 0836   CO2 22 07/25/2018 0836   GLUCOSE 98 07/25/2018 0836   GLUCOSE 109 (H) 01/18/2017 0902   BUN 17 07/25/2018 0836   CREATININE 1.21 (H) 07/25/2018 0836   CALCIUM 9.3 07/25/2018 0836   GFRNONAA 45 (L) 07/25/2018 0630  GFRAA 52 (L) 07/25/2018 0836   Lab Results  Component Value Date   HGBA1C 5.8 (H) 07/25/2018   HGBA1C 5.7 (H) 09/18/2017   HGBA1C (H) 04/15/2010    5.9 (NOTE)                                                                        According to the ADA Clinical Practice Recommendations for 2011, when HbA1c is used as a screening test:   >=6.5%   Diagnostic of Diabetes Mellitus           (if abnormal result  is confirmed)  5.7-6.4%   Increased risk of developing Diabetes Mellitus  References:Diagnosis and Classification of Diabetes Mellitus,Diabetes ZOXW,9604,54(UJWJX 1):S62-S69 and Standards of Medical Care in         Diabetes - 2011,Diabetes Care,2011,34  (Suppl 1):S11-S61.   HGBA1C 5.8 12/31/2007   Lab Results  Component Value Date   INSULIN 22.0 07/25/2018   INSULIN 25.3 (H) 09/18/2017   CBC    Component Value Date/Time   WBC 6.4 09/18/2017 1049   WBC 6.6 01/18/2017 0902   RBC 4.57 09/18/2017 1049   RBC 4.39 01/18/2017 0902   HGB 13.5 09/18/2017 1049   HCT 40.4 09/18/2017 1049   PLT 240.0 01/18/2017 0902   MCV 88 09/18/2017 1049   MCH 29.5 09/18/2017 1049   MCH 29.4 07/02/2014 1143   MCHC 33.4 09/18/2017 1049   MCHC 33.2 01/18/2017 0902   RDW 15.2 09/18/2017 1049   LYMPHSABS 2.6 09/18/2017 1049   MONOABS 0.9 04/15/2010 2135   EOSABS 0.2 09/18/2017 1049   BASOSABS 0.0 09/18/2017 1049   Iron/TIBC/Ferritin/ %Sat No results found for: IRON, TIBC, FERRITIN, IRONPCTSAT Lipid Panel     Component Value Date/Time   CHOL 200 (H) 07/25/2018 0836   TRIG 164 (H) 07/25/2018 0836   HDL 68 07/25/2018 0836   CHOLHDL 2 01/18/2017 0902   VLDL 28.2 01/18/2017 0902   LDLCALC 99 07/25/2018 0836   LDLDIRECT 77.0 01/18/2017 0902   Hepatic Function Panel     Component Value Date/Time   PROT 6.5 07/25/2018 0836   ALBUMIN 4.5 07/25/2018 0836   AST 17 07/25/2018 0836   ALT 17 07/25/2018 0836   ALKPHOS 99 07/25/2018 0836   BILITOT 0.2 07/25/2018 0836   BILIDIR <0.1 04/15/2010 2135   IBILI NOT CALCULATED 04/15/2010 2135      Component Value Date/Time   TSH 3.430 09/18/2017 1049   TSH 2.09 05/25/2015 0820   TSH 1.89 12/31/2007 0831      OBESITY BEHAVIORAL INTERVENTION VISIT  Today's visit was # 20    Starting weight: 249 lbs Starting date: 09/18/17 Today's weight : 239 lbs  Today's date: 12/16/2018 Total lbs lost to date: 10    ASK: We discussed the diagnosis of obesity with [name] today and Kynadee agreed to give [name] permission to discuss obesity behavioral modification therapy today.  ASSESS: Joda has the diagnosis of obesity and her BMI today is 36.35 Paullette is in the action stage of change   ADVISE: Zia was educated on the multiple health risks of obesity as well as the benefit of weight loss to improve her health. She was advised of the need for long term  treatment and the importance of lifestyle modifications to improve her current health and to decrease her risk of future health problems.  AGREE: Multiple dietary modification options and treatment options were discussed and  Seryna agreed to follow the recommendations documented in the above note.  ARRANGE: Lanie was educated on the importance of frequent visits to treat obesity as outlined per CMS and USPSTF guidelines and agreed to schedule her next follow up appointment today.  I, Trixie Dredge, am acting as transcriptionist for Ilene Qua, MD  I have reviewed the above documentation for accuracy and completeness, and I agree with the above. - Ilene Qua, MD

## 2019-01-09 ENCOUNTER — Encounter (INDEPENDENT_AMBULATORY_CARE_PROVIDER_SITE_OTHER): Payer: Self-pay | Admitting: Physician Assistant

## 2019-01-09 ENCOUNTER — Ambulatory Visit (INDEPENDENT_AMBULATORY_CARE_PROVIDER_SITE_OTHER): Payer: Medicare HMO | Admitting: Physician Assistant

## 2019-01-09 VITALS — BP 97/67 | HR 77 | Temp 97.4°F | Ht 68.0 in | Wt 240.0 lb

## 2019-01-09 DIAGNOSIS — E7849 Other hyperlipidemia: Secondary | ICD-10-CM | POA: Diagnosis not present

## 2019-01-09 DIAGNOSIS — Z6836 Body mass index (BMI) 36.0-36.9, adult: Secondary | ICD-10-CM | POA: Diagnosis not present

## 2019-01-09 DIAGNOSIS — R7303 Prediabetes: Secondary | ICD-10-CM

## 2019-01-09 DIAGNOSIS — E559 Vitamin D deficiency, unspecified: Secondary | ICD-10-CM

## 2019-01-09 MED ORDER — VITAMIN D (ERGOCALCIFEROL) 1.25 MG (50000 UNIT) PO CAPS: 50000.0000 [IU] | ORAL_CAPSULE | ORAL | 0 refills | Status: DC

## 2019-01-09 NOTE — Progress Notes (Signed)
Office: [phone]  /  Fax: [phone]   HPI:   Chief Complaint: OBESITY Angie Velasquez is here to discuss her progress with her obesity treatment plan. She is on the Category 2 plan and is following her eating plan approximately 50% of the time. She states she is exercising 0 minutes 0 times per week. Angie Velasquez reports that she has not been getting enough protein in, especially at dinner.   Her weight is 240 lb (108.9 kg) today and has had a 1 pound weight gain since her last visit. She has lost 9 lbs since starting treatment with [name].  Vitamin D deficiency Angie Velasquez has a diagnosis of Vitamin D deficiency. She is currently taking prescription Vit D and denies nausea, vomiting or muscle weakness.  Pre-Diabetes Angie Velasquez has a diagnosis of prediabetes based on her elevated HgA1c and was informed this puts her at greater risk of developing diabetes. Angie Velasquez does not report checking her blood sugars or having any hypoglycemic episodes. She  is not taking metformin currently and continues to work on diet and exercise to decrease risk of diabetes. She denies nausea or hypoglycemia. Angie Velasquez reports polyphagia in the afternoon.   Hyperlipidemia Angie Velasquez has hyperlipidemia and has been trying to improve her cholesterol levels with intensive lifestyle modification including a low saturated fat diet, exercise and weight loss. Angie Velasquez is on simvastatin. She denies any chest pain.  ASSESSMENT AND PLAN:  Vitamin D deficiency - Plan: VITAMIN D 25 Hydroxy (Vit-D Deficiency, Fractures), Vitamin D, Ergocalciferol, (DRISDOL) 1.25 MG (50000 UT) CAPS capsule  Prediabetes - Plan: Hemoglobin A1c, Insulin, random, Comprehensive metabolic panel  Other hyperlipidemia - Plan: Lipid Panel With LDL/HDL Ratio  Class 2 severe obesity with serious comorbidity and body mass index (BMI) of 36.0 to 36.9 in adult, unspecified obesity type ([place])  PLAN:  Vitamin D Deficiency Angie Velasquez was informed that low Vitamin D levels  contributes to fatigue and are associated with obesity, breast, and colon cancer. She agrees to continue to take prescription Vit D @ 50,000 IU every week #4 with no refills and will have labs drawn today. She was informed of the risk of over-replacement of Vitamin D and agrees to not increase her dose unless she discusses this with [name] first. Angie Velasquez agrees to follow-up with our clinic in 2 weeks.  Pre-Diabetes Angie Velasquez will continue to work on weight loss, exercise, and decreasing simple carbohydrates in her diet to help decrease the risk of diabetes. We dicussed metformin including benefits and risks. She was informed that eating too many simple carbohydrates or too many calories at one sitting increases the likelihood of GI side effects. Angie Velasquez is on no medications and has agreed to follow-up with [name] as directed to monitor her progress.  Hyperlipidemia Angie Velasquez was informed of the American Heart Association Guidelines emphasizing intensive lifestyle modifications as the first line treatment for hyperlipidemia. We discussed many lifestyle modifications today in depth, and Angie Velasquez will continue to work on decreasing saturated fats such as fatty red meat, butter and many fried foods. She will also increase vegetables and lean protein in her diet and continue to work on exercise and weight loss efforts. Angie Velasquez will continue taking simvastatin and agrees to follow-up with our clinic in 2 weeks. Labs will be drawn today.  Obesity Angie Velasquez is currently in the action stage of change. As such, her goal is to continue with weight loss efforts. She has agreed to follow the Category 2 plan and add more protein to microwave meals. Angie Velasquez has been instructed to  work up to a goal of 150 minutes of combined cardio and strengthening exercise per week for weight loss and overall health benefits. We discussed the following Behavioral Modification Strategies today: increasing lean protein intake and keeping healthy  foods in the home.   Angie Velasquez has agreed to follow up with our clinic in 2 weeks. She was informed of the importance of frequent follow up visits to maximize her success with intensive lifestyle modifications for her multiple health conditions.  ALLERGIES: Allergies  Allergen Reactions  . Epinephrine Palpitations  . Codeine Nausea Only  . Penicillins Nausea Only    MEDICATIONS: Current Outpatient Medications on File Prior to Visit  Medication Sig Dispense Refill  . acetaminophen (TYLENOL) 500 MG tablet Take 500 mg by mouth every 6 (six) hours as needed.    [name] allopurinol (ZYLOPRIM) 300 MG tablet Take 1 tablet (300 mg total) by mouth 2 (two) times daily. 180 tablet 3  . aspirin 81 MG tablet Take 81 mg by mouth daily.    [name] buPROPion (WELLBUTRIN SR) 200 MG 12 hr tablet Take 1 tablet (200 mg total) by mouth daily. 30 tablet 0  . CALCIUM-MAGNESIUM-ZINC PO Take by mouth.    . clonazePAM (KLONOPIN) 0.5 MG tablet Take 1 tablet (0.5 mg total) by mouth at bedtime as needed. 90 tablet 1  . diazepam (VALIUM) 10 MG tablet Take 10 mg by mouth every 6 (six) hours as needed (as needed for muscle spasms).    . diclofenac sodium (VOLTAREN) 1 % GEL Apply topically to affected area qid 100 g 2  . gabapentin (NEURONTIN) 300 MG capsule Take 1 capsule, orally, up to TID or as directed 270 capsule 1  . lisinopril (PRINIVIL,ZESTRIL) 2.5 MG tablet TAKE 1 TABLET EVERY DAY 90 tablet 3  . nystatin cream (MYCOSTATIN) Apply 1 application topically 2 (two) times daily. To rash in groin for up to 2 weeks maximum 90 g 2  . OVER THE COUNTER MEDICATION Cherry extract bid    . OVER THE COUNTER MEDICATION B-12 one daily    . OVER THE COUNTER MEDICATION Vitamin D    . polyethylene glycol powder (GLYCOLAX/MIRALAX) powder Take 17 g by mouth daily. 3350 g 0  . sertraline (ZOLOFT) 50 MG tablet TAKE 1 TABLET (50 MG TOTAL) BY MOUTH DAILY. 90 tablet 3  . simvastatin (ZOCOR) 20 MG tablet TAKE 1 TABLET AT BEDTIME 90 tablet 0  .  Sodium Bicarbonate (NICE PURE BAKING SODA) POWD by Does not apply route.    . traMADol (ULTRAM) 50 MG tablet Take 1 tablet (50 mg total) by mouth every 6 (six) hours as needed for moderate pain or severe pain. 60 tablet 1  . triamcinolone cream (KENALOG) 0.1 % Apply 1 application topically 2 (two) times daily. For 7-10 days for recurring eczema issues 80 g 1   No current facility-administered medications on file prior to visit.     PAST MEDICAL HISTORY: Past Medical History:  Diagnosis Date  . Anxiety    years ago - no longer an issue  . Arthritis    gout  . Cataracts, bilateral   . Complication of anesthesia   . Fracture of pelvis ([place])   . GERD (gastroesophageal reflux disease)   . Hx of chest pain    "anxiety"  . Hyperlipidemia   . INSOMNIA 10/14/2008   in past  . Joint pain   . Kidney problem   . Low blood sugar    eats several smaller meals a day  .  Obesity   . Palpitations    Stress related   . Proteinuria    H/O  . Tobacco abuse    quit smoking 08/2013    PAST SURGICAL HISTORY: Past Surgical History:  Procedure Laterality Date  . ABDOMINAL HYSTERECTOMY    . APPENDECTOMY     with hysterectomy  . childbirth     x4  . CHOLECYSTECTOMY  2001  . EYE SURGERY Bilateral 2015   cataract surgery with lens implant  . NEPHRECTOMY  1992   complex cystic mass - nephrectomy right  . ORIF ANKLE FRACTURE Right 07/02/2014   Procedure: OPEN REDUCTION INTERNAL FIXATION (ORIF) RIGHT ANKLE BIMALLOELAR FRACTURE;  Surgeon: Wylene Simmer, MD;  Location: [facility];  Service: Orthopedics;  Laterality: Right;  . OTHER SURGICAL HISTORY     hysterectomy, R ovary remains  . SHOULDER SURGERY     bone spurs left    SOCIAL HISTORY: Social History   Tobacco Use  . Smoking status: Former Smoker    Packs/day: 1.00    Years: 35.00    Pack years: 35.00    Types: Cigarettes    Last attempt to quit: 09/01/2013    Years since quitting: 5.3  . Smokeless tobacco: Never Used  . Tobacco comment:  Former smoker with no desire to smoke again. Smoking years edited at patients request to include periods when she did not smoke.  Substance Use Topics  . Alcohol use: Yes    Alcohol/week: 1.0 standard drinks    Types: 1 Shots of liquor per week  . Drug use: No    FAMILY HISTORY: Family History  Problem Relation Age of Onset  . Stroke Father   . Alzheimer's disease Father        early, states also parkinsons. died 47  . Hypertension Father   . Obesity Father   . Ovarian cancer Mother        Dr. Jana Hakim   . Arthritis Mother   . Obesity Mother   . Colon cancer Paternal Grandmother   . Liver disease Daughter        On hospice age 64   ROS: Review of Systems  Constitutional: Negative for weight loss.  Cardiovascular: Negative for chest pain.  Gastrointestinal: Negative for nausea and vomiting.  Musculoskeletal:       Negative muscle weakness  Endo/Heme/Allergies:       Positive polyphagia in the afternoon   PHYSICAL EXAM: Blood pressure 97/67, pulse 77, temperature (!) 97.4 F (36.3 C), temperature source Oral, height 5\' 8"  (1.727 m), weight 240 lb (108.9 kg), SpO2 96 %. Body mass index is 36.49 kg/m. Physical Exam Vitals signs reviewed.  Constitutional:      Appearance: Normal appearance. She is obese.  Cardiovascular:     Rate and Rhythm: Normal rate.     Pulses: Normal pulses.  Pulmonary:     Effort: Pulmonary effort is normal.     Breath sounds: Normal breath sounds.  Musculoskeletal: Normal range of motion.  Skin:    General: Skin is warm and dry.  Neurological:     Mental Status: She is alert and oriented to person, place, and time.  Psychiatric:        Mood and Affect: Mood normal.        Behavior: Behavior normal.   RECENT LABS AND TESTS: BMET    Component Value Date/Time   NA 141 07/25/2018 0836   K 4.1 07/25/2018 0836   CL 103 07/25/2018 0836   CO2 22  07/25/2018 0836   GLUCOSE 98 07/25/2018 0836   GLUCOSE 109 (H) 01/18/2017 0902   BUN 17  07/25/2018 0836   CREATININE 1.21 (H) 07/25/2018 0836   CALCIUM 9.3 07/25/2018 0836   GFRNONAA 45 (L) 07/25/2018 0836   GFRAA 52 (L) 07/25/2018 0836   Lab Results  Component Value Date   HGBA1C 5.8 (H) 07/25/2018   HGBA1C 5.7 (H) 09/18/2017   HGBA1C (H) 04/15/2010    5.9 (NOTE)                                                                       According to the ADA Clinical Practice Recommendations for 2011, when HbA1c is used as a screening test:   >=6.5%   Diagnostic of Diabetes Mellitus           (if abnormal result  is confirmed)  5.7-6.4%   Increased risk of developing Diabetes Mellitus  References:Diagnosis and Classification of Diabetes Mellitus,Diabetes TMHD,6222,97(LGXQJ 1):S62-S69 and Standards of Medical Care in         Diabetes - 2011,Diabetes Care,2011,34  (Suppl 1):S11-S61.   HGBA1C 5.8 12/31/2007   Lab Results  Component Value Date   INSULIN 22.0 07/25/2018   INSULIN 25.3 (H) 09/18/2017   CBC    Component Value Date/Time   WBC 6.4 09/18/2017 1049   WBC 6.6 01/18/2017 0902   RBC 4.57 09/18/2017 1049   RBC 4.39 01/18/2017 0902   HGB 13.5 09/18/2017 1049   HCT 40.4 09/18/2017 1049   PLT 240.0 01/18/2017 0902   MCV 88 09/18/2017 1049   MCH 29.5 09/18/2017 1049   MCH 29.4 07/02/2014 1143   MCHC 33.4 09/18/2017 1049   MCHC 33.2 01/18/2017 0902   RDW 15.2 09/18/2017 1049   LYMPHSABS 2.6 09/18/2017 1049   MONOABS 0.9 04/15/2010 2135   EOSABS 0.2 09/18/2017 1049   BASOSABS 0.0 09/18/2017 1049   Iron/TIBC/Ferritin/ %Sat No results found for: IRON, TIBC, FERRITIN, IRONPCTSAT Lipid Panel     Component Value Date/Time   CHOL 200 (H) 07/25/2018 0836   TRIG 164 (H) 07/25/2018 0836   HDL 68 07/25/2018 0836   CHOLHDL 2 01/18/2017 0902   VLDL 28.2 01/18/2017 0902   LDLCALC 99 07/25/2018 0836   LDLDIRECT 77.0 01/18/2017 0902   Hepatic Function Panel     Component Value Date/Time   PROT 6.5 07/25/2018 0836   ALBUMIN 4.5 07/25/2018 0836   AST 17 07/25/2018  0836   ALT 17 07/25/2018 0836   ALKPHOS 99 07/25/2018 0836   BILITOT 0.2 07/25/2018 0836   BILIDIR <0.1 04/15/2010 2135   IBILI NOT CALCULATED 04/15/2010 2135      Component Value Date/Time   TSH 3.430 09/18/2017 1049   TSH 2.09 05/25/2015 0820   TSH 1.89 12/31/2007 0831   Results for Judie Bonus ELIZABETH "[name]" (MRN [id number]) as of 01/09/2019 11:58  Ref. Range 07/25/2018 08:36  Vitamin D, 25-Hydroxy Latest Ref Range: 30.0 - 100.0 ng/mL 39.1   OBESITY BEHAVIORAL INTERVENTION VISIT  Today's visit was #21  Starting weight: 249 lbs Starting date: 09/18/2017 Today's weight: 240 lbs Today's date: 01/09/2019 Total lbs lost to date: 9 At least 15 minutes were spent on discussing the following behavioral intervention visit.  ASK: We discussed the  diagnosis of obesity with [name] today and Andalyn agreed to give [name] permission to discuss obesity behavioral modification therapy today.  ASSESS: Zohal has the diagnosis of obesity and her BMI today is @ 36.49. Naturi is in the action stage of change.  ADVISE: Angely was educated on the multiple health risks of obesity as well as the benefit of weight loss to improve her health. She was advised of the need for long term treatment and the importance of lifestyle modifications to improve her current health and to decrease her risk of future health problems.  AGREE: Multiple dietary modification options and treatment options were discussed and  Sunya agreed to follow the recommendations documented in the above note.  ARRANGE: Trinidy was educated on the importance of frequent visits to treat obesity as outlined per CMS and USPSTF guidelines and agreed to schedule her next follow up appointment today.  Migdalia Dk, am acting as transcriptionist for Abby Potash, PA-C I, Abby Potash, PA-C have reviewed above note and agree with its content

## 2019-01-10 LAB — LIPID PANEL WITH LDL/HDL RATIO
Cholesterol, Total: 215 mg/dL — ABNORMAL HIGH (ref 100–199)
HDL: 61 mg/dL (ref >39)
LDL Calculated: 112 mg/dL — ABNORMAL HIGH (ref 0–99)
LDl/HDL Ratio: 1.8 ratio (ref 0.0–3.2)
Triglycerides: 210 mg/dL — ABNORMAL HIGH (ref 0–149)
VLDL Cholesterol Cal: 42 mg/dL — ABNORMAL HIGH (ref 5–40)

## 2019-01-10 LAB — COMPREHENSIVE METABOLIC PANEL
ALT: 28 IU/L (ref 0–32)
AST: 29 IU/L (ref 0–40)
Albumin/Globulin Ratio: 1.4 (ref 1.2–2.2)
Albumin: 4.6 g/dL (ref 3.7–4.7)
Alkaline Phosphatase: 94 IU/L (ref 39–117)
BUN/Creatinine Ratio: 11 — ABNORMAL LOW (ref 12–28)
BUN: 12 mg/dL (ref 8–27)
Bilirubin Total: 0.3 mg/dL (ref 0.0–1.2)
CO2: 22 mmol/L (ref 20–29)
Calcium: 9.7 mg/dL (ref 8.7–10.3)
Chloride: 101 mmol/L (ref 96–106)
Creatinine, Ser: 1.07 mg/dL — ABNORMAL HIGH (ref 0.57–1.00)
GFR calc Af Amer: 60 mL/min/{1.73_m2} (ref >59)
GFR calc non Af Amer: 52 mL/min/{1.73_m2} — ABNORMAL LOW (ref >59)
GLOBULIN, TOTAL: 3.3 g/dL (ref 1.5–4.5)
Glucose: 107 mg/dL — ABNORMAL HIGH (ref 65–99)
Potassium: 4.5 mmol/L (ref 3.5–5.2)
Sodium: 141 mmol/L (ref 134–144)
Total Protein: 7.9 g/dL (ref 6.0–8.5)

## 2019-01-10 LAB — HEMOGLOBIN A1C
Est. average glucose Bld gHb Est-mCnc: 117 mg/dL
Hgb A1c MFr Bld: 5.7 % — ABNORMAL HIGH (ref 4.8–5.6)

## 2019-01-10 LAB — VITAMIN D 25 HYDROXY (VIT D DEFICIENCY, FRACTURES): Vit D, 25-Hydroxy: 39.4 ng/mL (ref 30.0–100.0)

## 2019-01-10 LAB — INSULIN, RANDOM: INSULIN: 18.6 u[IU]/mL (ref 2.6–24.9)

## 2019-01-21 ENCOUNTER — Ambulatory Visit: Payer: Medicare HMO | Admitting: Family Medicine

## 2019-01-27 ENCOUNTER — Encounter: Payer: Self-pay | Admitting: Family Medicine

## 2019-01-27 NOTE — Telephone Encounter (Signed)
Can you please schedule this? Thank you!

## 2019-01-28 ENCOUNTER — Encounter (INDEPENDENT_AMBULATORY_CARE_PROVIDER_SITE_OTHER): Payer: Self-pay | Admitting: Physician Assistant

## 2019-01-28 ENCOUNTER — Ambulatory Visit (INDEPENDENT_AMBULATORY_CARE_PROVIDER_SITE_OTHER): Payer: Medicare HMO | Admitting: Physician Assistant

## 2019-01-28 VITALS — BP 114/72 | HR 75 | Temp 98.1°F | Ht 68.0 in | Wt 244.0 lb

## 2019-01-28 DIAGNOSIS — E559 Vitamin D deficiency, unspecified: Secondary | ICD-10-CM | POA: Diagnosis not present

## 2019-01-28 DIAGNOSIS — Z6837 Body mass index (BMI) 37.0-37.9, adult: Secondary | ICD-10-CM

## 2019-01-28 NOTE — Progress Notes (Signed)
Office: [phone]  /  Fax: [phone]   HPI:   Chief Complaint: OBESITY Angie Velasquez is here to discuss her progress with her obesity treatment plan. She is on the Category 2 plan and is following her eating plan approximately 80% of the time. She states she is exercising 0 minutes 0 times per week. Angie Velasquez reports that she has been undereating her protein. She is under a lot of stress due to issues with her ex-husband. Her weight is 244 lb (110.7 kg) today and has had a weight gain of 4 lbs since her last visit. She has lost 5 lbs since starting treatment with [name].  Vitamin Angie Velasquez deficiency Angie Velasquez has a diagnosis of Vitamin Angie Velasquez deficiency. She is currently taking prescription Vit Angie Velasquez and denies nausea, vomiting or muscle weakness.  ASSESSMENT AND PLAN:  Vitamin Angie Velasquez deficiency  Class 2 severe obesity with serious comorbidity and body mass index (BMI) of 37.0 to 37.9 in adult, unspecified obesity type (Angie Velasquez)  PLAN:  Vitamin Angie Velasquez Deficiency Angie Velasquez was informed that low Vitamin Angie Velasquez levels contributes to fatigue and are associated with obesity, breast, and colon cancer. She agrees to continue to take prescription Vit Angie Velasquez @ 50,000 IU every week and will follow-up for routine testing of Vitamin Angie Velasquez, at least 2-3 times per year. She was informed of the risk of over-replacement of Vitamin Angie Velasquez and agrees to not increase her dose unless she discusses this with [name] first. Angie Velasquez agrees to follow-up with our clinic in 2 weeks.  Obesity Angie Velasquez is currently in the action stage of change. As such, her goal is to continue with weight loss efforts. She has agreed to follow the Category 2 plan. Angie Velasquez has been instructed to work up to a goal of 150 minutes of combined cardio and strengthening exercise per week for weight loss and overall health benefits. We discussed the following Behavioral Modification Strategies today: increasing lean protein intake and work on meal planning and easy cooking plans.  Angie Velasquez has  agreed to follow-up with our clinic in 2 weeks. She was informed of the importance of frequent follow up visits to maximize her success with intensive lifestyle modifications for her multiple health conditions.  ALLERGIES: Allergies  Allergen Reactions  . Epinephrine Palpitations  . Codeine Nausea Only  . Penicillins Nausea Only    MEDICATIONS: Current Outpatient Medications on File Prior to Visit  Medication Sig Dispense Refill  . acetaminophen (TYLENOL) 500 MG tablet Take 500 mg by mouth every 6 (six) hours as needed.    [name] allopurinol (ZYLOPRIM) 300 MG tablet Take 1 tablet (300 mg total) by mouth 2 (two) times daily. 180 tablet 3  . aspirin 81 MG tablet Take 81 mg by mouth daily.    [name] buPROPion (WELLBUTRIN SR) 200 MG 12 hr tablet Take 1 tablet (200 mg total) by mouth daily. 30 tablet 0  . CALCIUM-MAGNESIUM-ZINC PO Take by mouth.    . clonazePAM (KLONOPIN) 0.5 MG tablet Take 1 tablet (0.5 mg total) by mouth at bedtime as needed. 90 tablet 1  . diazepam (VALIUM) 10 MG tablet Take 10 mg by mouth every 6 (six) hours as needed (as needed for muscle spasms).    . diclofenac sodium (VOLTAREN) 1 % GEL Apply topically to affected area qid 100 g 2  . gabapentin (NEURONTIN) 300 MG capsule Take 1 capsule, orally, up to TID or as directed 270 capsule 1  . lisinopril (PRINIVIL,ZESTRIL) 2.5 MG tablet TAKE 1 TABLET EVERY DAY 90 tablet 3  . nystatin  cream (MYCOSTATIN) Apply 1 application topically 2 (two) times daily. To rash in groin for up to 2 weeks maximum 90 g 2  . OVER THE COUNTER MEDICATION Cherry extract bid    . OVER THE COUNTER MEDICATION B-12 one daily    . OVER THE COUNTER MEDICATION Vitamin Angie Velasquez    . polyethylene glycol powder (GLYCOLAX/MIRALAX) powder Take 17 g by mouth daily. 3350 g 0  . sertraline (ZOLOFT) 50 MG tablet TAKE 1 TABLET (50 MG TOTAL) BY MOUTH DAILY. 90 tablet 3  . simvastatin (ZOCOR) 20 MG tablet TAKE 1 TABLET AT BEDTIME 90 tablet 0  . Sodium Bicarbonate (NICE PURE BAKING  SODA) POWD by Does not apply route.    . traMADol (ULTRAM) 50 MG tablet Take 1 tablet (50 mg total) by mouth every 6 (six) hours as needed for moderate pain or severe pain. 60 tablet 1  . triamcinolone cream (KENALOG) 0.1 % Apply 1 application topically 2 (two) times daily. For 7-10 days for recurring eczema issues 80 g 1  . Vitamin Angie Velasquez, Ergocalciferol, (DRISDOL) 1.25 MG (50000 UT) CAPS capsule Take 1 capsule (50,000 Units total) by mouth every 7 (seven) days. 4 capsule 0   No current facility-administered medications on file prior to visit.     PAST MEDICAL HISTORY: Past Medical History:  Diagnosis Date  . Anxiety    years ago - no longer an issue  . Arthritis    gout  . Cataracts, bilateral   . Complication of anesthesia   . Fracture of pelvis ([place])   . GERD (gastroesophageal reflux disease)   . Hx of chest pain    "anxiety"  . Hyperlipidemia   . INSOMNIA 10/14/2008   in past  . Joint pain   . Kidney problem   . Low blood sugar    eats several smaller meals a day  . Obesity   . Palpitations    Stress related   . Proteinuria    H/O  . Tobacco abuse    quit smoking 08/2013    PAST SURGICAL HISTORY: Past Surgical History:  Procedure Laterality Date  . ABDOMINAL HYSTERECTOMY    . APPENDECTOMY     with hysterectomy  . childbirth     x4  . CHOLECYSTECTOMY  2001  . EYE SURGERY Bilateral 2015   cataract surgery with lens implant  . NEPHRECTOMY  1992   complex cystic mass - nephrectomy right  . ORIF ANKLE FRACTURE Right 07/02/2014   Procedure: OPEN REDUCTION INTERNAL FIXATION (ORIF) RIGHT ANKLE BIMALLOELAR FRACTURE;  Surgeon: Wylene Simmer, MD;  Location: [facility];  Service: Orthopedics;  Laterality: Right;  . OTHER SURGICAL HISTORY     hysterectomy, R ovary remains  . SHOULDER SURGERY     bone spurs left    SOCIAL HISTORY: Social History   Tobacco Use  . Smoking status: Former Smoker    Packs/day: 1.00    Years: 35.00    Pack years: 35.00    Types: Cigarettes     Last attempt to quit: 09/01/2013    Years since quitting: 5.4  . Smokeless tobacco: Never Used  . Tobacco comment: Former smoker with no desire to smoke again. Smoking years edited at patients request to include periods when she did not smoke.  Substance Use Topics  . Alcohol use: Yes    Alcohol/week: 1.0 standard drinks    Types: 1 Shots of liquor per week  . Drug use: No    FAMILY HISTORY: Family History  Problem  Relation Age of Onset  . Stroke Father   . Alzheimer's disease Father        early, states also parkinsons. died 58  . Hypertension Father   . Obesity Father   . Ovarian cancer Mother        Dr. Jana Hakim   . Arthritis Mother   . Obesity Mother   . Colon cancer Paternal Grandmother   . Liver disease Daughter        On hospice age 76   ROS: Review of Systems  Constitutional: Negative for weight loss.  Gastrointestinal: Negative for nausea and vomiting.  Musculoskeletal:       Negative for muscle weakness.  Endo/Heme/Allergies:       Negative for hypoglycemia.   PHYSICAL EXAM: Blood pressure 114/72, pulse 75, temperature 98.1 F (36.7 C), temperature source Oral, height 5\' 8"  (1.727 m), weight 244 lb (110.7 kg), SpO2 96 %. Body mass index is 37.1 kg/m. Physical Exam Vitals signs reviewed.  Constitutional:      Appearance: Normal appearance. She is obese.  Cardiovascular:     Rate and Rhythm: Normal rate.     Pulses: Normal pulses.  Pulmonary:     Effort: Pulmonary effort is normal.     Breath sounds: Normal breath sounds.  Musculoskeletal: Normal range of motion.  Skin:    General: Skin is warm and dry.  Neurological:     Mental Status: She is alert and oriented to person, place, and time.  Psychiatric:        Behavior: Behavior normal.   RECENT LABS AND TESTS: BMET    Component Value Date/Time   NA 141 01/09/2019 1126   K 4.5 01/09/2019 1126   CL 101 01/09/2019 1126   CO2 22 01/09/2019 1126   GLUCOSE 107 (H) 01/09/2019 1126   GLUCOSE 109 (H)  01/18/2017 0902   BUN 12 01/09/2019 1126   CREATININE 1.07 (H) 01/09/2019 1126   CALCIUM 9.7 01/09/2019 1126   GFRNONAA 52 (L) 01/09/2019 1126   GFRAA 60 01/09/2019 1126   Lab Results  Component Value Date   HGBA1C 5.7 (H) 01/09/2019   HGBA1C 5.8 (H) 07/25/2018   HGBA1C 5.7 (H) 09/18/2017   HGBA1C (H) 04/15/2010    5.9 (NOTE)                                                                       According to the ADA Clinical Practice Recommendations for 2011, when HbA1c is used as a screening test:   >=6.5%   Diagnostic of Diabetes Mellitus           (if abnormal result  is confirmed)  5.7-6.4%   Increased risk of developing Diabetes Mellitus  References:Diagnosis and Classification of Diabetes Mellitus,Diabetes PJAS,5053,97(QBHAL 1):S62-S69 and Standards of Medical Care in         Diabetes - 2011,Diabetes Care,2011,34  (Suppl 1):S11-S61.   HGBA1C 5.8 12/31/2007   Lab Results  Component Value Date   INSULIN 18.6 01/09/2019   INSULIN 22.0 07/25/2018   INSULIN 25.3 (H) 09/18/2017   CBC    Component Value Date/Time   WBC 6.4 09/18/2017 1049   WBC 6.6 01/18/2017 0902   RBC 4.57 09/18/2017 1049   RBC 4.39 01/18/2017 0902  HGB 13.5 09/18/2017 1049   HCT 40.4 09/18/2017 1049   PLT 240.0 01/18/2017 0902   MCV 88 09/18/2017 1049   MCH 29.5 09/18/2017 1049   MCH 29.4 07/02/2014 1143   MCHC 33.4 09/18/2017 1049   MCHC 33.2 01/18/2017 0902   RDW 15.2 09/18/2017 1049   LYMPHSABS 2.6 09/18/2017 1049   MONOABS 0.9 04/15/2010 2135   EOSABS 0.2 09/18/2017 1049   BASOSABS 0.0 09/18/2017 1049   Iron/TIBC/Ferritin/ %Sat No results found for: IRON, TIBC, FERRITIN, IRONPCTSAT Lipid Panel     Component Value Date/Time   CHOL 215 (H) 01/09/2019 1126   TRIG 210 (H) 01/09/2019 1126   HDL 61 01/09/2019 1126   CHOLHDL 2 01/18/2017 0902   VLDL 28.2 01/18/2017 0902   LDLCALC 112 (H) 01/09/2019 1126   LDLDIRECT 77.0 01/18/2017 0902   Hepatic Function Panel     Component Value Date/Time     PROT 7.9 01/09/2019 1126   ALBUMIN 4.6 01/09/2019 1126   AST 29 01/09/2019 1126   ALT 28 01/09/2019 1126   ALKPHOS 94 01/09/2019 1126   BILITOT 0.3 01/09/2019 1126   BILIDIR <0.1 04/15/2010 2135   IBILI NOT CALCULATED 04/15/2010 2135      Component Value Date/Time   TSH 3.430 09/18/2017 1049   TSH 2.09 05/25/2015 0820   TSH 1.89 12/31/2007 0831    Ref. Range 01/09/2019 11:26  Vitamin Angie Velasquez, 25-Hydroxy Latest Ref Range: 30.0 - 100.0 ng/mL 39.4    OBESITY BEHAVIORAL INTERVENTION VISIT  Today's visit was #22  Starting weight: 249 lbs Starting date: 09/18/2017 Today's weight: 244 lbs  Today's date: 01/28/2019 Total lbs lost to date: 5 At least 15 minutes were spent on discussing the following behavioral intervention visit.    01/28/2019  Height 5\' 8"  (1.727 m)  Weight 244 lb (110.7 kg)  BMI (Calculated) 37.11  BLOOD PRESSURE - SYSTOLIC 810  BLOOD PRESSURE - DIASTOLIC 72   Body Fat % 17.5 %  Total Body Water (lbs) 87.6 lbs   ASK: We discussed the diagnosis of obesity with Angie Velasquez today and Angie Velasquez agreed to give [name] permission to discuss obesity behavioral modification therapy today.  ASSESS: Angie Velasquez has the diagnosis of obesity and her BMI today is 37.11. Angie Velasquez is in the action stage of change.   ADVISE: Angie Velasquez was educated on the multiple health risks of obesity as well as the benefit of weight loss to improve her health. She was advised of the need for long term treatment and the importance of lifestyle modifications to improve her current health and to decrease her risk of future health problems.  AGREE: Multiple dietary modification options and treatment options were discussed and  Angie Velasquez agreed to follow the recommendations documented in the above note.  ARRANGE: Angie Velasquez was educated on the importance of frequent visits to treat obesity as outlined per CMS and USPSTF guidelines and agreed to schedule her next follow up appointment today.  Migdalia Dk, am acting as transcriptionist for Abby Potash, PA-C I, Abby Potash, PA-C have reviewed above note and agree with its content

## 2019-01-29 ENCOUNTER — Ambulatory Visit: Payer: Self-pay

## 2019-01-29 ENCOUNTER — Encounter: Payer: Self-pay | Admitting: Sports Medicine

## 2019-01-29 ENCOUNTER — Ambulatory Visit: Payer: Medicare HMO | Admitting: Sports Medicine

## 2019-01-29 VITALS — BP 110/80 | HR 76 | Ht 68.0 in

## 2019-01-29 DIAGNOSIS — M869 Osteomyelitis, unspecified: Secondary | ICD-10-CM

## 2019-01-29 MED ORDER — HYDROCODONE-ACETAMINOPHEN 5-325 MG PO TABS: 1.0000 | ORAL_TABLET | Freq: Four times a day (QID) | ORAL | 0 refills | Status: DC | PRN

## 2019-01-29 NOTE — Patient Instructions (Signed)

## 2019-01-29 NOTE — Procedures (Signed)
PROCEDURE NOTE:  Ultrasound Guided: Injection: Pubic Symphsis  Images were obtained and interpreted by myself, Teresa Coombs, DO  Images have been saved and stored to PACS system. Images obtained on: GE S7 Ultrasound machine    ULTRASOUND FINDINGS:  Positive mushroom sign with osteophytic spurring over the pubic symphysis  DESCRIPTION OF PROCEDURE:  The patient's clinical condition is marked by substantial pain and/or significant functional disability. Other conservative therapy has not provided relief, is contraindicated, or not appropriate. There is a reasonable likelihood that injection will significantly improve the patient's pain and/or functional impairment.   After discussing the risks, benefits and expected outcomes of the injection and all questions were reviewed and answered, the patient wished to undergo the above named procedure.  Verbal consent was obtained.  The ultrasound was used to identify the target structure and adjacent neurovascular structures. The skin was then prepped in sterile fashion and the target structure was injected under direct visualization using sterile technique as below:  Single injection performed as below: PREP: Alcohol and Ethel Chloride APPROACH:direct, single injection, 22g 3.5 in. INJECTATE: 1 cc 0.5% Marcaine and 1 cc 80mg /mL DepoMedrol ASPIRATE: None DRESSING: Band-Aid  Post procedural instructions including recommending icing and warning signs for infection were reviewed.    This procedure was well tolerated and there were no complications.   IMPRESSION: Succesful Ultrasound Guided: Injection

## 2019-01-29 NOTE — Progress Notes (Signed)
Angie Velasquez. , [place] [facility] [place]  Angie Velasquez - 73 y.o. female MRN [id number]  Date of birth: 06/25/1946  Visit Date: January 29, 2019  PCP: Marin Olp, MD   Referred by: Marin Olp, MD  SUBJECTIVE:  Chief Complaint  Patient presents with  . Pelvic Pain    HPI: Patient presents 4 months after her last pubic symphysis injection for recurrent pain.  She overall has done well over the past several months including increasing her activity on a drive back from [place] but typically does cause significant flareup of her symptoms.  She did well with this after the last injection.  She has also been visiting family and having exacerbation after spending time with her granddaughter slightly more active than normal.  She is using a walker.  Is worse with activity as well as prolonged sitting.  No other changes in her health otherwise  REVIEW OF SYSTEMS: No significant nighttime awakenings due to this issue. Denies fevers, chills, recent weight gain or weight loss.  No night sweats.  Pt denies any change in bowel or bladder habits, muscle weakness, numbness or falls associated with this pain.  HISTORY:  Prior history reviewed and updated per electronic medical record.  Patient Active Problem List   Diagnosis Date Noted  . Osteitis condensans 06/25/2018  . Osteoarthritis of spine with radiculopathy, cervical region 02/12/2018  . Intertrigo 01/10/2018    Nyastatin works well for rash in groin- intermittent   . Pes anserinus bursitis of left knee 01/02/2018  . Osteitis pubis ([place]) 12/19/2017    Fairly severe osteitis pubis based on MRI from July 2018 where she was told that this was a stress fracture in the pubic ramus.  6 months of using a walker and her symptoms are continuing to be severe and actually now occurring on the left side.  Diagnostic and therapeutic injection performed on 12/19/2017 with  100% improvement in her pain immediately following this with the anesthetic phase.  Good, sustained response following injection.  Can repeat if any recurrence of symptoms.  Now walker and doing well.  Now off of a walker and doing well.    . Prediabetes 10/22/2017  . Other fatigue 09/18/2017  . Shortness of breath on exertion 09/18/2017  . Vitamin D deficiency 09/18/2017  . Hyperglycemia 09/18/2017  . Class 2 severe obesity with serious comorbidity and body mass index (BMI) of 35.0 to 35.9 in adult ([place]) 06/13/2017    BMI >35 with hypertension and hyperlipidemia   . Atypical chest pain 03/26/2017  . Depression 12/19/2016    zoloft 50mg    . Anxiety state 07/20/2016    Clonazepam - Uses regularly for sleep Diazepam spasms in neck about once a year   . Essential hypertension 01/04/2016    Lisinopril primarily renal protective   . Eczema 06/29/2015    Recurrent on ankles. Sees [facility] usually. Triamcinolone rx   . Gout 03/12/2015    Since 2014, allopurinol. Colchicine prn.    . S/p nephrectomy 03/12/2015    Cr 1.1-1.3. Started on lisinopril 2.5mg  by Dr. Leanne Chang after nephrectomy for complex cystic mass   . Hyperlipidemia 03/12/2015    Simvastatin 20mg .     . Tinnitus 03/12/2015    L ear. Seen ENT told needed hearing aids.    [name] COPD (chronic obstructive pulmonary disease) ([place]) 03/12/2015    Diagnosed years ago, no current symptoms.    . History  of colonic polyps 03/12/2015    Adenoma 05/2015- 5 year follow up   . Bimalleolar fracture 07/02/2014    06/2014. R leg. Still with some pain.    . Arthritis     Prefer tramadol to nsaids given nephrectomy if needed.  Neck primarily. Has intermittent neck spasms helped by valium in past. Cherry extract vitamin   . Former smoker     Quit sept 2014. 50 pack years. Concerned about cost for lung cancer screening- is considering.    [name] GERD 01/23/2008    Nexium 20mg     . MICROALBUMINURIA 01/23/2008    Unilateral kidney due to  resection. On ace-i . dont see lab that confirms this.     . Microscopic hematuria 01/23/2008    2015 eval [facility]-told had resolved-has had history of issues. Denies cystoscopy and CT.     [name] UTI (urinary tract infection) 01/08/2008    Prolonged infection Sept 2014, did not progress to pyelonephritis, was untreated by [facility] so switched physicians.      Social History   Occupational History  . Occupation: retired  Tobacco Use  . Smoking status: Former Smoker    Packs/day: 1.00    Years: 35.00    Pack years: 35.00    Types: Cigarettes    Last attempt to quit: 09/01/2013    Years since quitting: 5.4  . Smokeless tobacco: Never Used  . Tobacco comment: Former smoker with no desire to smoke again. Smoking years edited at patients request to include periods when she did not smoke.  Substance and Sexual Activity  . Alcohol use: Yes    Alcohol/week: 1.0 standard drinks    Types: 1 Shots of liquor per week  . Drug use: No  . Sexual activity: Not on file   Social History   Social History Narrative   Divorced. Found love of her life recently in 2016 (Al). 3 sons. 5 grandkids.    Daughter (had at age 3 and was adopted). To care for [name] her granddaughter when daughter passes- daughter on hospice.       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 No acute distress.  Alert and appropriate. walking with a walker and bent over antalgic gait.  She has marked pain directly over the pubic symphysis.  Negative straight leg raise bilaterally.  No significant abdominal pain.   ASSESSMENT:  1. Osteitis pubis ([facility])     PROCEDURES:  US Guided  Injection per procedure note      PLAN:  Pertinent additional documentation may be included in corresponding procedure notes, imaging studies, problem based documentation and patient instructions.  No problem-specific Assessment & Plan notes found for this encounter.   Recurrent flareup of her osteitis pubis pain.  She is responded quite well to injections in the past and we will go ahead and repeat this today.  Given the acuity of the pain short course of pain medication provided.  She understands this is to be used sparingly and only for acute severe episodes such as this.  PDMP database searched and appropriate fill pattern.  Continue previously prescribed home exercise program.    Activity modifications and the importance of avoiding exacerbating activities (limiting pain to no more than a 4 / 10 during or following activity) recommended and discussed.  Discussed red flag symptoms that warrant earlier emergent evaluation and patient voices understanding.   Meds ordered this encounter  Medications  . DISCONTD: HYDROcodone-acetaminophen (NORCO) 5-325 MG tablet    Sig: Take 1 tablet by mouth every 6 (six) hours as needed.    Dispense:  15 tablet    Refill:  0  . HYDROcodone-acetaminophen (NORCO) 5-325 MG tablet    Sig: Take 1 tablet by mouth every 6 (six) hours as needed.    Dispense:  15 tablet    Refill:  0   Lab Orders  No laboratory test(s) ordered today    Imaging Orders     [name] [facility] POCT ULTRASOUND Referral Orders  No referral(s) requested today    Return in about 12 weeks (around 04/23/2019).          Gerda Diss, [facility] Physician

## 2019-01-30 ENCOUNTER — Telehealth: Payer: Self-pay | Admitting: Family Medicine

## 2019-01-30 MED ORDER — AMBULATORY NON FORMULARY MEDICATION: 0 refills | Status: DC

## 2019-01-30 NOTE — Telephone Encounter (Signed)
Copied from [place] [phone]. Topic: General - Other >> Jan 30, 2019  8:49 AM Virl Axe D wrote: Reason for CRM:   AMBULATORY NON FORMULARY MEDICATION / [facility] stated they need insurance information faxed over so that pt can get her rolling walker. Please advise. Fax#[phone]

## 2019-01-30 NOTE — Addendum Note (Signed)
Addended by: Jasper Loser on: 01/30/2019 08:08 AM   Modules accepted: Orders

## 2019-01-30 NOTE — Telephone Encounter (Signed)
See note

## 2019-01-31 NOTE — Telephone Encounter (Signed)
Has been faxed.

## 2019-01-31 NOTE — Telephone Encounter (Signed)
Can you please find insurance information and fax this to [facility]

## 2019-02-04 ENCOUNTER — Encounter (INDEPENDENT_AMBULATORY_CARE_PROVIDER_SITE_OTHER): Payer: Self-pay | Admitting: Physician Assistant

## 2019-02-04 ENCOUNTER — Encounter: Payer: Self-pay | Admitting: Sports Medicine

## 2019-02-04 NOTE — Telephone Encounter (Signed)
Both pt's have appointments with Dr. Yong Channel on 02/14/19.

## 2019-02-05 ENCOUNTER — Ambulatory Visit (INDEPENDENT_AMBULATORY_CARE_PROVIDER_SITE_OTHER): Payer: Medicare HMO | Admitting: Sports Medicine

## 2019-02-05 ENCOUNTER — Ambulatory Visit: Payer: Medicare HMO

## 2019-02-05 ENCOUNTER — Ambulatory Visit (INDEPENDENT_AMBULATORY_CARE_PROVIDER_SITE_OTHER): Payer: Medicare HMO

## 2019-02-05 ENCOUNTER — Encounter: Payer: Self-pay | Admitting: Sports Medicine

## 2019-02-05 VITALS — BP 114/68 | HR 73 | Ht 68.0 in

## 2019-02-05 DIAGNOSIS — M869 Osteomyelitis, unspecified: Secondary | ICD-10-CM

## 2019-02-05 DIAGNOSIS — M8530 Osteitis condensans, unspecified site: Secondary | ICD-10-CM | POA: Diagnosis not present

## 2019-02-05 NOTE — Progress Notes (Signed)
Angie Velasquez. Angie Velasquez, Angie Velasquez at Angie Velasquez  Angie Velasquez - 73 y.o. female MRN [id number]  Date of birth: Feb 08, 1946  Visit Date: February 08, 2019  PCP: Marin Olp, MD   Referred by: Marin Olp, MD  SUBJECTIVE:   Chief Complaint  Patient presents with  . Follow-up    Pelvic pain.  Symphysis pubis injections previously - 01/29/19, 09/28/19.  Hydrocodone.  Rolling walker    HPI: Recurrent symptoms as above.  She has not had any falls.  She did travel to [place] with her grandchild and did an excessive amount of walking prior to the onset of this.  Unfortunately the injection performed on 01/29/2019 did not provide significant improvement.  Some pain radiating to right thigh and groin.  REVIEW OF SYSTEMS: This is currently keeping her awake at night and causing pain day-to-day to the point she is having to use the walker.  No reported falls. Denies fevers, chills, recent weight gain or weight loss.  No night sweats. No significant nighttime awakenings due to this issue.  HISTORY:  Prior history reviewed and updated per electronic medical record.  Patient Active Problem List   Diagnosis Date Noted  . Osteitis condensans 06/25/2018  . Osteoarthritis of spine with radiculopathy, cervical region 02/12/2018  . Intertrigo 01/10/2018    Nyastatin works well for rash in groin- intermittent   . Pes anserinus bursitis of left knee 01/02/2018  . Osteitis pubis ([place]) 12/19/2017    Fairly severe osteitis pubis based on MRI from July 2018 where she was told that this was a stress fracture in the pubic ramus.  6 months of using a walker and her symptoms are continuing to be severe and actually now occurring on the left side.  Diagnostic and therapeutic injection performed on 12/19/2017 with 100% improvement in her pain immediately following this with the anesthetic phase.  Good, sustained response following  injection.  Can repeat if any recurrence of symptoms.  Now walker and doing well.  Now off of a walker and doing well.    . Prediabetes 10/22/2017  . Other fatigue 09/18/2017  . Shortness of breath on exertion 09/18/2017  . Vitamin D deficiency 09/18/2017  . Hyperglycemia 09/18/2017  . Class 2 severe obesity with serious comorbidity and body mass index (BMI) of 35.0 to 35.9 in adult ([place]) 06/13/2017    BMI >35 with hypertension and hyperlipidemia   . Atypical chest pain 03/26/2017  . Depression 12/19/2016    zoloft 50mg    . Anxiety state 07/20/2016    Clonazepam - Uses regularly for sleep Diazepam spasms in neck about once a year   . Essential hypertension 01/04/2016    Lisinopril primarily renal protective   . Eczema 06/29/2015    Recurrent on ankles. Sees [facility] usually. Triamcinolone rx   . Gout 03/12/2015    Since 2014, allopurinol. Colchicine prn.    . S/p nephrectomy 03/12/2015    Cr 1.1-1.3. Started on lisinopril 2.5mg  by Dr. Leanne Chang after nephrectomy for complex cystic mass   . Hyperlipidemia 03/12/2015    Simvastatin 20mg .     . Tinnitus 03/12/2015    L ear. Seen ENT told needed hearing aids.    [name] COPD (chronic obstructive pulmonary disease) ([place]) 03/12/2015    Diagnosed years ago, no current symptoms.    . History of colonic polyps 03/12/2015    Adenoma 05/2015- 5 year follow up   . Bimalleolar fracture 07/02/2014  06/2014. R leg. Still with some pain.    . Arthritis     Prefer tramadol to nsaids given nephrectomy if needed.  Neck primarily. Has intermittent neck spasms helped by valium in past. Cherry extract vitamin   . Former smoker     Quit sept 2014. 50 pack years. Concerned about cost for lung cancer screening- is considering.    [name] GERD 01/23/2008    Nexium 20mg     . MICROALBUMINURIA 01/23/2008    Unilateral kidney due to resection. On ace-i . dont see lab that confirms this.     . Microscopic hematuria 01/23/2008    2015 eval [facility]  urology-told had resolved-has had history of issues. Denies cystoscopy and CT.     [name] UTI (urinary tract infection) 01/08/2008    Prolonged infection Sept 2014, did not progress to pyelonephritis, was untreated by [facility] so switched physicians.      Social History   Occupational History  . Occupation: retired  Tobacco Use  . Smoking status: Former Smoker    Packs/day: 1.00    Years: 35.00    Pack years: 35.00    Types: Cigarettes    Last attempt to quit: 09/01/2013    Years since quitting: 5.4  . Smokeless tobacco: Never Used  . Tobacco comment: Former smoker with no desire to smoke again. Smoking years edited at patients request to include periods when she did not smoke.  Substance and Sexual Activity  . Alcohol use: Yes    Alcohol/week: 1.0 standard drinks    Types: 1 Shots of liquor per week  . Drug use: No  . Sexual activity: Not on file   Social History   Social History Narrative   Divorced. Found love of her life recently in 2016 ([place]). 3 sons. 5 grandkids.    Daughter (had at age 69 and was adopted). To care for [name] her granddaughter when daughter passes- daughter on hospice.       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 No acute distress.  Alert and appropriate. Marked pain directly over the pubic symphysis.  She has good internal/external rotation of bilateral hips.  Marked antalgic gait.  Using a rolling walker.   ASSESSMENT:   1. Osteitis pubis ([place])     PROCEDURES:  None     PLAN:  Pertinent additional documentation may be included in corresponding procedure notes, imaging studies, problem based  documentation and patient instructions.  No problem-specific Assessment & Plan notes found for this encounter.   Repeat injection performed last week has not provided significant benefit.  X-rays that were obtained today I do show significant osteitis pubis without evidence of significant osteoarthritic change within the hip joint.  Given the findings of this it is prudent to go ahead and obtain an MRI to evaluate for recurrent stress fracture of the pelvis.  Additional considerations discussed today were referral for pelvic stabilization surgery with Dr. Marcelino Scot or other traumatologist.  We will discuss this once the results are obtained.  >50% of this 25 minute visit spent in direct patient counseling and/or coordination  of care.  Discussion was focused on education regarding the in discussing the pathoetiology and anticipated clinical course of the above condition.  Activity modifications and the importance of avoiding exacerbating activities (limiting pain to no more than a 4 / 10 during or following activity) recommended and discussed.  Discussed red flag symptoms that warrant earlier emergent evaluation and patient voices understanding.   No orders of the defined types were placed in this encounter.  Lab Orders  No laboratory test(s) ordered today    Imaging Orders     DG HIP UNILAT W OR W/O PELVIS 2-3 VIEWS RIGHT     MR PELVIS WO CONTRAST Referral Orders  No referral(s) requested today    Return for mri results review in office.          Gerda Diss, [facility] Physician

## 2019-02-05 NOTE — Patient Instructions (Signed)

## 2019-02-08 ENCOUNTER — Encounter: Payer: Self-pay | Admitting: Sports Medicine

## 2019-02-10 ENCOUNTER — Encounter: Payer: Self-pay | Admitting: Sports Medicine

## 2019-02-10 NOTE — Telephone Encounter (Signed)
Order for MRI has been canceled. See referral.

## 2019-02-11 ENCOUNTER — Encounter (INDEPENDENT_AMBULATORY_CARE_PROVIDER_SITE_OTHER): Payer: Self-pay | Admitting: Physician Assistant

## 2019-02-12 ENCOUNTER — Ambulatory Visit (INDEPENDENT_AMBULATORY_CARE_PROVIDER_SITE_OTHER): Payer: Medicare HMO | Admitting: Physician Assistant

## 2019-02-12 ENCOUNTER — Other Ambulatory Visit (INDEPENDENT_AMBULATORY_CARE_PROVIDER_SITE_OTHER): Payer: Self-pay

## 2019-02-12 DIAGNOSIS — E559 Vitamin D deficiency, unspecified: Secondary | ICD-10-CM

## 2019-02-12 MED ORDER — VITAMIN D (ERGOCALCIFEROL) 1.25 MG (50000 UNIT) PO CAPS: 50000.0000 [IU] | ORAL_CAPSULE | ORAL | 0 refills | Status: DC

## 2019-02-14 ENCOUNTER — Ambulatory Visit (INDEPENDENT_AMBULATORY_CARE_PROVIDER_SITE_OTHER): Payer: Medicare HMO | Admitting: Family Medicine

## 2019-02-14 ENCOUNTER — Encounter: Payer: Self-pay | Admitting: Family Medicine

## 2019-02-14 ENCOUNTER — Ambulatory Visit: Payer: Medicare HMO | Admitting: Family Medicine

## 2019-02-14 ENCOUNTER — Other Ambulatory Visit: Payer: Self-pay

## 2019-02-14 VITALS — BP 110/68 | HR 71 | Temp 97.6°F | Ht 68.0 in | Wt 245.0 lb

## 2019-02-14 DIAGNOSIS — Z905 Acquired absence of kidney: Secondary | ICD-10-CM

## 2019-02-14 DIAGNOSIS — N183 Chronic kidney disease, stage 3 unspecified: Secondary | ICD-10-CM

## 2019-02-14 DIAGNOSIS — J449 Chronic obstructive pulmonary disease, unspecified: Secondary | ICD-10-CM | POA: Diagnosis not present

## 2019-02-14 DIAGNOSIS — E785 Hyperlipidemia, unspecified: Secondary | ICD-10-CM | POA: Diagnosis not present

## 2019-02-14 DIAGNOSIS — M1A9XX0 Chronic gout, unspecified, without tophus (tophi): Secondary | ICD-10-CM | POA: Diagnosis not present

## 2019-02-14 DIAGNOSIS — F411 Generalized anxiety disorder: Secondary | ICD-10-CM | POA: Diagnosis not present

## 2019-02-14 DIAGNOSIS — I10 Essential (primary) hypertension: Secondary | ICD-10-CM | POA: Diagnosis not present

## 2019-02-14 DIAGNOSIS — F3289 Other specified depressive episodes: Secondary | ICD-10-CM

## 2019-02-14 MED ORDER — CLONAZEPAM 0.5 MG PO TABS: 0.5000 mg | ORAL_TABLET | Freq: Every evening | ORAL | 1 refills | Status: DC | PRN

## 2019-02-14 NOTE — Progress Notes (Signed)
Phone [phone]   Subjective:  Angie Velasquez is a 73 y.o. year old very pleasant female patient who presents for/with See problem oriented charting ROS- No chest pain or shortness of breath. No headache or blurry vision.    Past Medical History-  Patient Active Problem List   Diagnosis Date Noted  . Depression 12/19/2016    Priority: High  . Anxiety state 07/20/2016    Priority: High  . S/p nephrectomy 03/12/2015    Priority: High  . Essential hypertension 01/04/2016    Priority: Medium  . Gout 03/12/2015    Priority: Medium  . Hyperlipidemia 03/12/2015    Priority: Medium  . COPD (chronic obstructive pulmonary disease) ([place]) 03/12/2015    Priority: Medium  . Arthritis     Priority: Medium  . Former smoker     Priority: Medium  . MICROALBUMINURIA 01/23/2008    Priority: Medium  . Eczema 06/29/2015    Priority: Low  . Tinnitus 03/12/2015    Priority: Low  . History of colonic polyps 03/12/2015    Priority: Low  . Bimalleolar fracture 07/02/2014    Priority: Low  . GERD 01/23/2008    Priority: Low  . Microscopic hematuria 01/23/2008    Priority: Low  . UTI (urinary tract infection) 01/08/2008    Priority: Low  . CKD (chronic kidney disease), stage III ([place]) 02/14/2019  . Osteitis condensans 06/25/2018  . Osteoarthritis of spine with radiculopathy, cervical region 02/12/2018  . Intertrigo 01/10/2018  . Pes anserinus bursitis of left knee 01/02/2018  . Osteitis pubis ([place]) 12/19/2017  . Prediabetes 10/22/2017  . Other fatigue 09/18/2017  . Shortness of breath on exertion 09/18/2017  . Vitamin D deficiency 09/18/2017  . Hyperglycemia 09/18/2017  . Class 2 severe obesity with serious comorbidity and body mass index (BMI) of 35.0 to 35.9 in adult ([place]) 06/13/2017  . Atypical chest pain 03/26/2017    Medications- reviewed and updated Current Outpatient Medications  Medication Sig Dispense Refill  . acetaminophen (TYLENOL) 500 MG tablet Take 500 mg  by mouth every 6 (six) hours as needed.    [name] allopurinol (ZYLOPRIM) 300 MG tablet Take 1 tablet (300 mg total) by mouth 2 (two) times daily. 180 tablet 3  . AMBULATORY NON FORMULARY MEDICATION Rolling walker with a seat 1 Units 0  . aspirin 81 MG tablet Take 81 mg by mouth daily.    [name] buPROPion (WELLBUTRIN SR) 200 MG 12 hr tablet Take 1 tablet (200 mg total) by mouth daily. 30 tablet 0  . CALCIUM-MAGNESIUM-ZINC PO Take by mouth.    . clonazePAM (KLONOPIN) 0.5 MG tablet Take 1 tablet (0.5 mg total) by mouth at bedtime as needed. 90 tablet 1  . diazepam (VALIUM) 10 MG tablet Take 10 mg by mouth every 6 (six) hours as needed (as needed for muscle spasms).    . diclofenac sodium (VOLTAREN) 1 % GEL Apply topically to affected area qid 100 g 2  . gabapentin (NEURONTIN) 300 MG capsule Take 1 capsule, orally, up to TID or as directed 270 capsule 1  . HYDROcodone-acetaminophen (NORCO) 5-325 MG tablet Take 1 tablet by mouth every 6 (six) hours as needed. 15 tablet 0  . lisinopril (PRINIVIL,ZESTRIL) 2.5 MG tablet TAKE 1 TABLET EVERY DAY 90 tablet 3  . nystatin cream (MYCOSTATIN) Apply 1 application topically 2 (two) times daily. To rash in groin for up to 2 weeks maximum 90 g 2  . OVER THE COUNTER MEDICATION Cherry extract bid    .  OVER THE COUNTER MEDICATION B-12 one daily    . OVER THE COUNTER MEDICATION Vitamin D    . polyethylene glycol powder (GLYCOLAX/MIRALAX) powder Take 17 g by mouth daily. 3350 g 0  . sertraline (ZOLOFT) 50 MG tablet TAKE 1 TABLET (50 MG TOTAL) BY MOUTH DAILY. 90 tablet 3  . simvastatin (ZOCOR) 20 MG tablet TAKE 1 TABLET AT BEDTIME 90 tablet 0  . Sodium Bicarbonate (NICE PURE BAKING SODA) POWD by Does not apply route.    . traMADol (ULTRAM) 50 MG tablet Take 1 tablet (50 mg total) by mouth every 6 (six) hours as needed for moderate pain or severe pain. 60 tablet 1  . triamcinolone cream (KENALOG) 0.1 % Apply 1 application topically 2 (two) times daily. For 7-10 days for recurring  eczema issues 80 g 1  . Vitamin D, Ergocalciferol, (DRISDOL) 1.25 MG (50000 UT) CAPS capsule Take 1 capsule (50,000 Units total) by mouth every 7 (seven) days. 4 capsule 0   No current facility-administered medications for this visit.      Objective:  BP 110/68 (BP Location: Left Arm, Patient Position: Sitting, Cuff Size: Large)   Pulse 71   Temp 97.6 F (36.4 C) (Oral)   Ht 5\' 8"  (1.727 m)   Wt 245 lb (111.1 kg)   SpO2 98%   BMI 37.25 kg/m  Gen: NAD, resting comfortably CV: RRR no murmurs rubs or gallops Lungs: CTAB no crackles, wheeze, rhonchi Abdomen: soft/nontender/nondistended/overweight Ext: no edema Skin: warm, dry Neuro: Normal speech, moves all extremities    Assessment and Plan   #Hyperlipidemia S: Compliant with simvastatin 20 mg. Mild elevation despite simvastin- she would like to continue to work on healthy lifestyle changes instead of increasing medicine- she thinks sedentary activity due to pain (seeing Dr. Paulla Fore) may have contributed to worsening) A/P: mild poor control- continue to work on healthy lifestyle choices and continue simvastatin 20mg    #Hypertension/microalbuminuria/CKD III S: Compliant with lisinopril 2.5 mg-though primarily on for renal protective effects with history of microalbuminuria A/P: Stable. Continue current medications.  With unilateral kidney- GFR has been in the 50s- honestly pretty good with unilateral kidney   #Depression-full remission/anxiety S: Compliant with Wellbutrin 200 mg sustained-release to help with weight loss.  She is also on low-dose Zoloft 50 mg.  Big drivers of stress in her life are her husband with mild cognitive impairment, hoarding, change in personality over last few years.  For husband- She has moved into a separate house and they have been able to continue the marriage (had considered divorce before). Her kids do not really want to be around her husband due to negativity (kids from prior relationship).  Due  to anxiety/stress-uses clonazepam-uses pretty regularly for sleep.  Uses very sparing diazepam for spasms in her neck balance ear- very sparingly   A/P: Stable problem. Continue zoloft and wellbutrin. phq9 of 0 today.  - refilled clonazepam  #Gout S: no recent gout flares on allopurinol 300 twice a day Lab Results  Component Value Date   LABURIC 5.1 01/04/2016  A/P: will need to check uric acid next visit- she declines today  # COPD- years ago diagnosed no recent issues.  - stable, contiue to monitor  Other notes: 1.  Seeing Dr. Paulla Fore for orthopedic issues-see his notes-I would be willing to refill tramadol if needed.  She is also on gabapentin 3 him and Voltaren gel. She is taking meloxicam short term- we discussed seeing if she chould come back off due to unilateral  kidney.  2. Has enjoyed weigh to wellness program  Advised verbally 5-month physical  Lab/Order associations: Hyperlipidemia, unspecified hyperlipidemia type  Chronic gout without tophus, unspecified cause, unspecified site  CKD (chronic kidney disease), stage III ([place])  Essential hypertension  Chronic obstructive pulmonary disease, unspecified COPD type ([place])  Other depression  S/p nephrectomy  Anxiety state  Meds ordered this encounter  Medications  . clonazePAM (KLONOPIN) 0.5 MG tablet    Sig: Take 1 tablet (0.5 mg total) by mouth at bedtime as needed.    Dispense:  90 tablet    Refill:  1    Return precautions advised.  Garret Reddish, MD

## 2019-02-14 NOTE — Patient Instructions (Addendum)
Glad you are doing well  Try to cut back on meloxicam as able due to only having one kidney

## 2019-02-27 ENCOUNTER — Encounter (INDEPENDENT_AMBULATORY_CARE_PROVIDER_SITE_OTHER): Payer: Self-pay

## 2019-03-03 ENCOUNTER — Encounter (INDEPENDENT_AMBULATORY_CARE_PROVIDER_SITE_OTHER): Payer: Self-pay | Admitting: Physician Assistant

## 2019-03-03 NOTE — Telephone Encounter (Signed)
Please schedule pt with telehealth visit

## 2019-03-05 ENCOUNTER — Encounter: Payer: Self-pay | Admitting: Family Medicine

## 2019-03-07 ENCOUNTER — Other Ambulatory Visit: Payer: Self-pay

## 2019-03-07 ENCOUNTER — Encounter: Payer: Self-pay | Admitting: Sports Medicine

## 2019-03-07 MED ORDER — GABAPENTIN 300 MG PO CAPS: ORAL_CAPSULE | ORAL | 1 refills | Status: DC

## 2019-03-11 ENCOUNTER — Other Ambulatory Visit: Payer: Self-pay | Admitting: Family Medicine

## 2019-03-11 ENCOUNTER — Encounter (INDEPENDENT_AMBULATORY_CARE_PROVIDER_SITE_OTHER): Payer: Self-pay | Admitting: Physician Assistant

## 2019-03-12 ENCOUNTER — Other Ambulatory Visit: Payer: Self-pay

## 2019-03-12 ENCOUNTER — Ambulatory Visit (INDEPENDENT_AMBULATORY_CARE_PROVIDER_SITE_OTHER): Payer: Medicare HMO | Admitting: Physician Assistant

## 2019-03-12 ENCOUNTER — Encounter (INDEPENDENT_AMBULATORY_CARE_PROVIDER_SITE_OTHER): Payer: Self-pay | Admitting: Physician Assistant

## 2019-03-12 DIAGNOSIS — E559 Vitamin D deficiency, unspecified: Secondary | ICD-10-CM

## 2019-03-12 DIAGNOSIS — Z6837 Body mass index (BMI) 37.0-37.9, adult: Secondary | ICD-10-CM | POA: Diagnosis not present

## 2019-03-12 DIAGNOSIS — E7849 Other hyperlipidemia: Secondary | ICD-10-CM

## 2019-03-12 MED ORDER — VITAMIN D (ERGOCALCIFEROL) 1.25 MG (50000 UNIT) PO CAPS: 50000.0000 [IU] | ORAL_CAPSULE | ORAL | 0 refills | Status: DC

## 2019-03-13 NOTE — Progress Notes (Signed)
Office: [phone]  /  Fax: [phone] TeleHealth Visit:  Angie Velasquez has verbally consented to this TeleHealth visit today. The patient is located at home, the provider is located at the [employer] [facility] office. The participants in this visit include the listed provider and patient. The visit was conducted today via audio (telephone call).  HPI:   Chief Complaint: OBESITY Angie Velasquez is here to discuss her progress with her obesity treatment plan. She is on the Category 2 plan and is following her eating plan approximately 50% of the time. She states she is exercising 0 minutes 0 times per week. Angie Velasquez reports that she has been more active recently cooking for her family. She is eating more protein throughout the day. We were unable to weigh the patient today for this TeleHealth visit. She feels as if she has lost 3 lbs since her last visit. She has lost 5 lbs since starting treatment with [name].  Vitamin D deficiency Angie Velasquez has a diagnosis of Vitamin D deficiency. She is currently taking prescription Vit D and denies nausea, vomiting or muscle weakness.  Hyperlipidemia Angie Velasquez has hyperlipidemia and is on Zocor. She has been trying to improve her cholesterol levels with intensive lifestyle modification including a low saturated fat diet, exercise and weight loss. She denies any chest pain.  ASSESSMENT AND PLAN:  Vitamin D deficiency - Plan: Vitamin D, Ergocalciferol, (DRISDOL) 1.25 MG (50000 UT) CAPS capsule  Other hyperlipidemia  Class 2 severe obesity with serious comorbidity and body mass index (BMI) of 37.0 to 37.9 in adult, unspecified obesity type (Angie Velasquez)  PLAN:  Vitamin D Deficiency Angie Velasquez was informed that low Vitamin D levels contributes to fatigue and are associated with obesity, breast, and colon cancer. She agrees to continue to take prescription Vit D @ 50,000 IU every week #4 with 0 refills and will follow-up for routine testing of Vitamin D, at  least 2-3 times per year. She was informed of the risk of over-replacement of Vitamin D and agrees to not increase her dose unless she discusses this with [name] first. Angie Velasquez agrees to follow-up with our clinic in 2 weeks.  Hyperlipidemia Angie Velasquez was informed of the American Heart Association Guidelines emphasizing intensive lifestyle modifications as the first line treatment for hyperlipidemia. We discussed many lifestyle modifications today in depth, and Angie Velasquez will continue to work on decreasing saturated fats such as fatty red meat, butter and many fried foods. She will also increase vegetables and lean protein in her diet and continue to work on exercise and weight loss efforts.  Obesity Angie Velasquez is currently in the action stage of change. As such, her goal is to continue with weight loss efforts. She has agreed to follow the Category 2 plan. Angie Velasquez has been instructed to work up to a goal of 150 minutes of combined cardio and strengthening exercise per week for weight loss and overall health benefits. We discussed the following Behavioral Modification Strategies today: increasing lean protein intake and work on meal planning and easy cooking plans.  Angie Velasquez has agreed to follow-up with our clinic in 2 weeks. She was informed of the importance of frequent follow-up visits to maximize her success with intensive lifestyle modifications for her multiple health conditions.  ALLERGIES: Allergies  Allergen Reactions  . Epinephrine Palpitations  . Codeine Nausea Only  . Penicillins Nausea Only    MEDICATIONS: Current Outpatient Medications on File Prior to Visit  Medication Sig Dispense Refill  . acetaminophen (TYLENOL) 500 MG tablet Take 500 mg  by mouth every 6 (six) hours as needed.    [name] allopurinol (ZYLOPRIM) 300 MG tablet Take 1 tablet (300 mg total) by mouth 2 (two) times daily. 180 tablet 3  . AMBULATORY NON FORMULARY MEDICATION Rolling walker with a seat 1 Units 0  . aspirin 81 MG  tablet Take 81 mg by mouth daily.    [name] buPROPion (WELLBUTRIN SR) 200 MG 12 hr tablet Take 1 tablet (200 mg total) by mouth daily. 30 tablet 0  . CALCIUM-MAGNESIUM-ZINC PO Take by mouth.    . clonazePAM (KLONOPIN) 0.5 MG tablet Take 1 tablet (0.5 mg total) by mouth at bedtime as needed. 90 tablet 1  . diazepam (VALIUM) 10 MG tablet Take 10 mg by mouth every 6 (six) hours as needed (as needed for muscle spasms).    . diclofenac sodium (VOLTAREN) 1 % GEL Apply topically to affected area qid 100 g 2  . gabapentin (NEURONTIN) 300 MG capsule Take 1 capsule, orally, up to TID or as directed 270 capsule 1  . HYDROcodone-acetaminophen (NORCO) 5-325 MG tablet Take 1 tablet by mouth every 6 (six) hours as needed. 15 tablet 0  . lisinopril (PRINIVIL,ZESTRIL) 2.5 MG tablet TAKE 1 TABLET EVERY DAY 90 tablet 3  . nystatin cream (MYCOSTATIN) APPLY 1 APPLICATION TOPICALLY TO RASH IN GROIN TWICE DAILY FOR UP TO 2 WEEKS MAXIMUM 90 g 2  . OVER THE COUNTER MEDICATION Cherry extract bid    . OVER THE COUNTER MEDICATION B-12 one daily    . OVER THE COUNTER MEDICATION Vitamin D    . polyethylene glycol powder (GLYCOLAX/MIRALAX) powder Take 17 g by mouth daily. 3350 g 0  . sertraline (ZOLOFT) 50 MG tablet TAKE 1 TABLET (50 MG TOTAL) BY MOUTH DAILY. 90 tablet 3  . simvastatin (ZOCOR) 20 MG tablet TAKE 1 TABLET AT BEDTIME 90 tablet 0  . Sodium Bicarbonate (NICE PURE BAKING SODA) POWD by Does not apply route.    . traMADol (ULTRAM) 50 MG tablet Take 1 tablet (50 mg total) by mouth every 6 (six) hours as needed for moderate pain or severe pain. 60 tablet 1  . triamcinolone cream (KENALOG) 0.1 % Apply 1 application topically 2 (two) times daily. For 7-10 days for recurring eczema issues 80 g 1   No current facility-administered medications on file prior to visit.     PAST MEDICAL HISTORY: Past Medical History:  Diagnosis Date  . Anxiety    years ago - no longer an issue  . Arthritis    gout  . Cataracts, bilateral    . Complication of anesthesia   . Fracture of pelvis ([place])   . GERD (gastroesophageal reflux disease)   . Hx of chest pain    "anxiety"  . Hyperlipidemia   . INSOMNIA 10/14/2008   in past  . Joint pain   . Kidney problem   . Low blood sugar    eats several smaller meals a day  . Obesity   . Palpitations    Stress related   . Proteinuria    H/O  . Tobacco abuse    quit smoking 08/2013    PAST SURGICAL HISTORY: Past Surgical History:  Procedure Laterality Date  . ABDOMINAL HYSTERECTOMY    . APPENDECTOMY     with hysterectomy  . childbirth     x4  . CHOLECYSTECTOMY  2001  . EYE SURGERY Bilateral 2015   cataract surgery with lens implant  . NEPHRECTOMY  1992   complex cystic mass -  nephrectomy right  . ORIF ANKLE FRACTURE Right 07/02/2014   Procedure: OPEN REDUCTION INTERNAL FIXATION (ORIF) RIGHT ANKLE BIMALLOELAR FRACTURE;  Surgeon: Wylene Simmer, MD;  Location: [place];  Service: Orthopedics;  Laterality: Right;  . OTHER SURGICAL HISTORY     hysterectomy, R ovary remains  . SHOULDER SURGERY     bone spurs left    SOCIAL HISTORY: Social History   Tobacco Use  . Smoking status: Former Smoker    Packs/day: 1.00    Years: 35.00    Pack years: 35.00    Types: Cigarettes    Last attempt to quit: 09/01/2013    Years since quitting: 5.5  . Smokeless tobacco: Never Used  . Tobacco comment: Former smoker with no desire to smoke again. Smoking years edited at patients request to include periods when she did not smoke.  Substance Use Topics  . Alcohol use: Yes    Alcohol/week: 1.0 standard drinks    Types: 1 Shots of liquor per week  . Drug use: No    FAMILY HISTORY: Family History  Problem Relation Age of Onset  . Stroke Father   . Alzheimer's disease Father        early, states also parkinsons. died 33  . Hypertension Father   . Obesity Father   . Ovarian cancer Mother        Dr. Jana Hakim   . Arthritis Mother   . Obesity Mother   . Colon cancer Paternal  Grandmother   . Liver disease Daughter        On hospice age 5   ROS: Review of Systems  Cardiovascular: Negative for chest pain.  Gastrointestinal: Negative for nausea and vomiting.  Musculoskeletal:       Negative for muscle weakness.   PHYSICAL EXAM: Pt in no acute distress  RECENT LABS AND TESTS: BMET    Component Value Date/Time   NA 141 01/09/2019 1126   K 4.5 01/09/2019 1126   CL 101 01/09/2019 1126   CO2 22 01/09/2019 1126   GLUCOSE 107 (H) 01/09/2019 1126   GLUCOSE 109 (H) 01/18/2017 0902   BUN 12 01/09/2019 1126   CREATININE 1.07 (H) 01/09/2019 1126   CALCIUM 9.7 01/09/2019 1126   GFRNONAA 52 (L) 01/09/2019 1126   GFRAA 60 01/09/2019 1126   Lab Results  Component Value Date   HGBA1C 5.7 (H) 01/09/2019   HGBA1C 5.8 (H) 07/25/2018   HGBA1C 5.7 (H) 09/18/2017   HGBA1C (H) 04/15/2010    5.9 (NOTE)                                                                       According to the ADA Clinical Practice Recommendations for 2011, when HbA1c is used as a screening test:   >=6.5%   Diagnostic of Diabetes Mellitus           (if abnormal result  is confirmed)  5.7-6.4%   Increased risk of developing Diabetes Mellitus  References:Diagnosis and Classification of Diabetes Mellitus,Diabetes LZJQ,7341,93(XTKWI 1):S62-S69 and Standards of Medical Care in         Diabetes - 2011,Diabetes OXBD,5329,92  (Suppl 1):S11-S61.   HGBA1C 5.8 12/31/2007   Lab Results  Component Value Date   INSULIN 18.6 01/09/2019  INSULIN 22.0 07/25/2018   INSULIN 25.3 (H) 09/18/2017   CBC    Component Value Date/Time   WBC 6.4 09/18/2017 1049   WBC 6.6 01/18/2017 0902   RBC 4.57 09/18/2017 1049   RBC 4.39 01/18/2017 0902   HGB 13.5 09/18/2017 1049   HCT 40.4 09/18/2017 1049   PLT 240.0 01/18/2017 0902   MCV 88 09/18/2017 1049   MCH 29.5 09/18/2017 1049   MCH 29.4 07/02/2014 1143   MCHC 33.4 09/18/2017 1049   MCHC 33.2 01/18/2017 0902   RDW 15.2 09/18/2017 1049   LYMPHSABS 2.6  09/18/2017 1049   MONOABS 0.9 04/15/2010 2135   EOSABS 0.2 09/18/2017 1049   BASOSABS 0.0 09/18/2017 1049   Iron/TIBC/Ferritin/ %Sat No results found for: IRON, TIBC, FERRITIN, IRONPCTSAT Lipid Panel     Component Value Date/Time   CHOL 215 (H) 01/09/2019 1126   TRIG 210 (H) 01/09/2019 1126   HDL 61 01/09/2019 1126   CHOLHDL 2 01/18/2017 0902   VLDL 28.2 01/18/2017 0902   LDLCALC 112 (H) 01/09/2019 1126   LDLDIRECT 77.0 01/18/2017 0902   Hepatic Function Panel     Component Value Date/Time   PROT 7.9 01/09/2019 1126   ALBUMIN 4.6 01/09/2019 1126   AST 29 01/09/2019 1126   ALT 28 01/09/2019 1126   ALKPHOS 94 01/09/2019 1126   BILITOT 0.3 01/09/2019 1126   BILIDIR <0.1 04/15/2010 2135   IBILI NOT CALCULATED 04/15/2010 2135      Component Value Date/Time   TSH 3.430 09/18/2017 1049   TSH 2.09 05/25/2015 0820   TSH 1.89 12/31/2007 0831   Results for Judie Bonus ELIZABETH "[name]" (MRN [id number]) as of 03/13/2019 07:40  Ref. Range 01/09/2019 11:26  Vitamin D, 25-Hydroxy Latest Ref Range: 30.0 - 100.0 ng/mL 39.4    I, Michaelene Song, am acting as Location manager for [employer], PA-C I, Abby Potash, PA-C have reviewed above note and agree with its content

## 2019-03-18 ENCOUNTER — Encounter: Payer: Self-pay | Admitting: Sports Medicine

## 2019-03-18 MED ORDER — DICLOFENAC SODIUM 1 % TD GEL: TRANSDERMAL | 2 refills | Status: AC

## 2019-03-27 ENCOUNTER — Other Ambulatory Visit: Payer: Self-pay

## 2019-03-27 ENCOUNTER — Ambulatory Visit (INDEPENDENT_AMBULATORY_CARE_PROVIDER_SITE_OTHER): Payer: Medicare HMO | Admitting: Physician Assistant

## 2019-03-27 ENCOUNTER — Encounter (INDEPENDENT_AMBULATORY_CARE_PROVIDER_SITE_OTHER): Payer: Self-pay | Admitting: Physician Assistant

## 2019-03-27 DIAGNOSIS — E559 Vitamin D deficiency, unspecified: Secondary | ICD-10-CM | POA: Diagnosis not present

## 2019-03-27 DIAGNOSIS — Z6837 Body mass index (BMI) 37.0-37.9, adult: Secondary | ICD-10-CM

## 2019-03-30 ENCOUNTER — Encounter: Payer: Self-pay | Admitting: Family Medicine

## 2019-03-31 ENCOUNTER — Encounter: Payer: Self-pay | Admitting: Sports Medicine

## 2019-03-31 ENCOUNTER — Encounter: Payer: Self-pay | Admitting: Family Medicine

## 2019-03-31 ENCOUNTER — Other Ambulatory Visit: Payer: Self-pay | Admitting: Sports Medicine

## 2019-03-31 ENCOUNTER — Ambulatory Visit (INDEPENDENT_AMBULATORY_CARE_PROVIDER_SITE_OTHER): Payer: Medicare HMO | Admitting: Family Medicine

## 2019-03-31 VITALS — Ht 68.0 in | Wt 243.0 lb

## 2019-03-31 DIAGNOSIS — J329 Chronic sinusitis, unspecified: Secondary | ICD-10-CM

## 2019-03-31 DIAGNOSIS — Z7189 Other specified counseling: Secondary | ICD-10-CM | POA: Diagnosis not present

## 2019-03-31 DIAGNOSIS — B9689 Other specified bacterial agents as the cause of diseases classified elsewhere: Secondary | ICD-10-CM | POA: Diagnosis not present

## 2019-03-31 DIAGNOSIS — J3489 Other specified disorders of nose and nasal sinuses: Secondary | ICD-10-CM | POA: Diagnosis not present

## 2019-03-31 MED ORDER — MELOXICAM 15 MG PO TABS: 15.0000 mg | ORAL_TABLET | Freq: Every day | ORAL | 0 refills | Status: DC

## 2019-03-31 MED ORDER — DOXYCYCLINE HYCLATE 100 MG PO TABS: 100.0000 mg | ORAL_TABLET | Freq: Two times a day (BID) | ORAL | 0 refills | Status: AC

## 2019-03-31 MED ORDER — TRAMADOL HCL 50 MG PO TABS: 50.0000 mg | ORAL_TABLET | Freq: Four times a day (QID) | ORAL | 1 refills | Status: DC | PRN

## 2019-03-31 NOTE — Patient Instructions (Signed)
There are no preventive care reminders to display for this patient.  Depression screen [facility] 2/9 02/14/2019 02/14/2019 01/10/2018  Decreased Interest 0 0 1  Down, Depressed, Hopeless 0 0 2  PHQ - 2 Score 0 0 3  Altered sleeping 0 - 0  Tired, decreased energy 0 - 0  Change in appetite 0 - 0  Feeling bad or failure about yourself  0 - 0  Trouble concentrating 0 - 0  Moving slowly or fidgety/restless 0 - 0  Suicidal thoughts 0 - 0  PHQ-9 Score 0 - 3  Difficult doing work/chores - - Somewhat difficult   Video visit

## 2019-03-31 NOTE — Progress Notes (Signed)
Office: [phone]  /  Fax: [phone] TeleHealth Visit:  Angie Velasquez has verbally consented to this TeleHealth visit today. The patient is located at home, the provider is located at the [employer] [facility] office. The participants in this visit include the listed provider and patient. Angie Velasquez was unable to use Webex today and the telehealth visit was conducted via telephone. HPI:   Chief Complaint: OBESITY Angie Velasquez is here to discuss her progress with her obesity treatment plan. She is on the Category 2 plan and is following her eating plan approximately 50 % of the time. She states she is exercising 0 minutes 0 times per week. Angie Velasquez reports that she believes that she has maintained her weight. She is busy working for her family that will be in town for a few weeks. She continues to have hip pain which reduces her ability to be as mobile.  We were unable to weigh the patient today for this TeleHealth visit. She feels as if she has maintained weight since her last visit. She has lost 5 lbs since starting treatment with [name].  Vitamin D deficiency Angie Velasquez has a diagnosis of vitamin D deficiency. She is currently taking vit D and denies nausea, vomiting, or muscle weakness.  ASSESSMENT AND PLAN:  Vitamin D deficiency  Class 2 severe obesity with serious comorbidity and body mass index (BMI) of 37.0 to 37.9 in adult, unspecified obesity type ([place])  PLAN:  Vitamin D Deficiency Angie Velasquez was informed that low vitamin D levels contribute to fatigue and are associated with obesity, breast, and colon cancer. Angie Velasquez agrees to continue to take prescription Vit D @50 ,000 IU every week  and will follow up for routine testing of vitamin D, at least 2-3 times per year. She was informed of the risk of over-replacement of vitamin D and agrees to not increase her dose unless she discusses this with [name] first. Angie Velasquez agrees to follow up in 2 weeks as directed.  Obesity Angie Velasquez is  currently in the action stage of change. As such, her goal is to continue with weight loss efforts. She has agreed to follow the Category 2 plan. Angie Velasquez has been instructed to work up to a goal of 150 minutes of combined cardio and strengthening exercise per week for weight loss and overall health benefits. We discussed the following Behavioral Modification Strategies today: work on meal planning and easy cooking plans and keeping healthy foods in the home.  Angie Velasquez has agreed to follow up with our clinic in 2 weeks. She was informed of the importance of frequent follow up visits to maximize her success with intensive lifestyle modifications for her multiple health conditions.  ALLERGIES: Allergies  Allergen Reactions  . Epinephrine Palpitations  . Codeine Nausea Only  . Penicillins Nausea Only    MEDICATIONS: Current Outpatient Medications on File Prior to Visit  Medication Sig Dispense Refill  . acetaminophen (TYLENOL) 500 MG tablet Take 500 mg by mouth every 6 (six) hours as needed.    [name] allopurinol (ZYLOPRIM) 300 MG tablet Take 1 tablet (300 mg total) by mouth 2 (two) times daily. 180 tablet 3  . AMBULATORY NON FORMULARY MEDICATION Rolling walker with a seat 1 Units 0  . aspirin 81 MG tablet Take 81 mg by mouth daily.    [name] buPROPion (WELLBUTRIN SR) 200 MG 12 hr tablet Take 1 tablet (200 mg total) by mouth daily. 30 tablet 0  . CALCIUM-MAGNESIUM-ZINC PO Take by mouth.    . clonazePAM (KLONOPIN) 0.5 MG  tablet Take 1 tablet (0.5 mg total) by mouth at bedtime as needed. 90 tablet 1  . diazepam (VALIUM) 10 MG tablet Take 10 mg by mouth every 6 (six) hours as needed (as needed for muscle spasms).    . diclofenac sodium (VOLTAREN) 1 % GEL Apply topically to affected area qid 100 g 2  . gabapentin (NEURONTIN) 300 MG capsule Take 1 capsule, orally, up to TID or as directed 270 capsule 1  . HYDROcodone-acetaminophen (NORCO) 5-325 MG tablet Take 1 tablet by mouth every 6 (six) hours as needed.  15 tablet 0  . lisinopril (PRINIVIL,ZESTRIL) 2.5 MG tablet TAKE 1 TABLET EVERY DAY 90 tablet 3  . nystatin cream (MYCOSTATIN) APPLY 1 APPLICATION TOPICALLY TO RASH IN GROIN TWICE DAILY FOR UP TO 2 WEEKS MAXIMUM 90 g 2  . OVER THE COUNTER MEDICATION Cherry extract bid    . OVER THE COUNTER MEDICATION B-12 one daily    . OVER THE COUNTER MEDICATION Vitamin D    . polyethylene glycol powder (GLYCOLAX/MIRALAX) powder Take 17 g by mouth daily. 3350 g 0  . sertraline (ZOLOFT) 50 MG tablet TAKE 1 TABLET (50 MG TOTAL) BY MOUTH DAILY. 90 tablet 3  . simvastatin (ZOCOR) 20 MG tablet TAKE 1 TABLET AT BEDTIME 90 tablet 0  . Sodium Bicarbonate (NICE PURE BAKING SODA) POWD by Does not apply route.    . traMADol (ULTRAM) 50 MG tablet Take 1 tablet (50 mg total) by mouth every 6 (six) hours as needed for moderate pain or severe pain. 60 tablet 1  . triamcinolone cream (KENALOG) 0.1 % Apply 1 application topically 2 (two) times daily. For 7-10 days for recurring eczema issues 80 g 1  . Vitamin D, Ergocalciferol, (DRISDOL) 1.25 MG (50000 UT) CAPS capsule Take 1 capsule (50,000 Units total) by mouth every 7 (seven) days. 4 capsule 0   No current facility-administered medications on file prior to visit.     PAST MEDICAL HISTORY: Past Medical History:  Diagnosis Date  . Anxiety    years ago - no longer an issue  . Arthritis    gout  . Cataracts, bilateral   . Complication of anesthesia   . Fracture of pelvis ([place])   . GERD (gastroesophageal reflux disease)   . Hx of chest pain    "anxiety"  . Hyperlipidemia   . INSOMNIA 10/14/2008   in past  . Joint pain   . Kidney problem   . Low blood sugar    eats several smaller meals a day  . Obesity   . Palpitations    Stress related   . Proteinuria    H/O  . Tobacco abuse    quit smoking 08/2013    PAST SURGICAL HISTORY: Past Surgical History:  Procedure Laterality Date  . ABDOMINAL HYSTERECTOMY    . APPENDECTOMY     with hysterectomy  .  childbirth     x4  . CHOLECYSTECTOMY  2001  . EYE SURGERY Bilateral 2015   cataract surgery with lens implant  . NEPHRECTOMY  1992   complex cystic mass - nephrectomy right  . ORIF ANKLE FRACTURE Right 07/02/2014   Procedure: OPEN REDUCTION INTERNAL FIXATION (ORIF) RIGHT ANKLE BIMALLOELAR FRACTURE;  Surgeon: Wylene Simmer, MD;  Location: [place];  Service: Orthopedics;  Laterality: Right;  . OTHER SURGICAL HISTORY     hysterectomy, R ovary remains  . SHOULDER SURGERY     bone spurs left    SOCIAL HISTORY: Social History   Tobacco  Use  . Smoking status: Former Smoker    Packs/day: 1.00    Years: 35.00    Pack years: 35.00    Types: Cigarettes    Last attempt to quit: 09/01/2013    Years since quitting: 5.5  . Smokeless tobacco: Never Used  . Tobacco comment: Former smoker with no desire to smoke again. Smoking years edited at patients request to include periods when she did not smoke.  Substance Use Topics  . Alcohol use: Yes    Alcohol/week: 1.0 standard drinks    Types: 1 Shots of liquor per week  . Drug use: No    FAMILY HISTORY: Family History  Problem Relation Age of Onset  . Stroke Father   . Alzheimer's disease Father        early, states also parkinsons. died 29  . Hypertension Father   . Obesity Father   . Ovarian cancer Mother        Dr. Jana Hakim   . Arthritis Mother   . Obesity Mother   . Colon cancer Paternal Grandmother   . Liver disease Daughter        On hospice age 40    ROS: Review of Systems  Gastrointestinal: Negative for nausea and vomiting.  Musculoskeletal:       Negative for muscle weakness.    PHYSICAL EXAM: Pt in no acute distress  RECENT LABS AND TESTS: BMET    Component Value Date/Time   NA 141 01/09/2019 1126   K 4.5 01/09/2019 1126   CL 101 01/09/2019 1126   CO2 22 01/09/2019 1126   GLUCOSE 107 (H) 01/09/2019 1126   GLUCOSE 109 (H) 01/18/2017 0902   BUN 12 01/09/2019 1126   CREATININE 1.07 (H) 01/09/2019 1126   CALCIUM  9.7 01/09/2019 1126   GFRNONAA 52 (L) 01/09/2019 1126   GFRAA 60 01/09/2019 1126   Lab Results  Component Value Date   HGBA1C 5.7 (H) 01/09/2019   HGBA1C 5.8 (H) 07/25/2018   HGBA1C 5.7 (H) 09/18/2017   HGBA1C (H) 04/15/2010    5.9 (NOTE)                                                                       According to the ADA Clinical Practice Recommendations for 2011, when HbA1c is used as a screening test:   >=6.5%   Diagnostic of Diabetes Mellitus           (if abnormal result  is confirmed)  5.7-6.4%   Increased risk of developing Diabetes Mellitus  References:Diagnosis and Classification of Diabetes Mellitus,Diabetes IWLN,9892,11(HERDE 1):S62-S69 and Standards of Medical Care in         Diabetes - 2011,Diabetes YCXK,4818,56  (Suppl 1):S11-S61.   HGBA1C 5.8 12/31/2007   Lab Results  Component Value Date   INSULIN 18.6 01/09/2019   INSULIN 22.0 07/25/2018   INSULIN 25.3 (H) 09/18/2017   CBC    Component Value Date/Time   WBC 6.4 09/18/2017 1049   WBC 6.6 01/18/2017 0902   RBC 4.57 09/18/2017 1049   RBC 4.39 01/18/2017 0902   HGB 13.5 09/18/2017 1049   HCT 40.4 09/18/2017 1049   PLT 240.0 01/18/2017 0902   MCV 88 09/18/2017 1049   MCH 29.5 09/18/2017 1049  MCH 29.4 07/02/2014 1143   MCHC 33.4 09/18/2017 1049   MCHC 33.2 01/18/2017 0902   RDW 15.2 09/18/2017 1049   LYMPHSABS 2.6 09/18/2017 1049   MONOABS 0.9 04/15/2010 2135   EOSABS 0.2 09/18/2017 1049   BASOSABS 0.0 09/18/2017 1049   Iron/TIBC/Ferritin/ %Sat No results found for: IRON, TIBC, FERRITIN, IRONPCTSAT Lipid Panel     Component Value Date/Time   CHOL 215 (H) 01/09/2019 1126   TRIG 210 (H) 01/09/2019 1126   HDL 61 01/09/2019 1126   CHOLHDL 2 01/18/2017 0902   VLDL 28.2 01/18/2017 0902   LDLCALC 112 (H) 01/09/2019 1126   LDLDIRECT 77.0 01/18/2017 0902   Hepatic Function Panel     Component Value Date/Time   PROT 7.9 01/09/2019 1126   ALBUMIN 4.6 01/09/2019 1126   AST 29 01/09/2019 1126   ALT  28 01/09/2019 1126   ALKPHOS 94 01/09/2019 1126   BILITOT 0.3 01/09/2019 1126   BILIDIR <0.1 04/15/2010 2135   IBILI NOT CALCULATED 04/15/2010 2135      Component Value Date/Time   TSH 3.430 09/18/2017 1049   TSH 2.09 05/25/2015 0820   TSH 1.89 12/31/2007 0831    Results for Judie Bonus ELIZABETH "[name]" (MRN [id number]) as of 03/31/2019 08:28  Ref. Range 01/09/2019 11:26  Vitamin D, 25-Hydroxy Latest Ref Range: 30.0 - 100.0 ng/mL 39.4    I, Marcille Blanco, CMA, am acting as transcriptionist for Abby Potash, PA-C I, Abby Potash, PA-C have reviewed above note and agree with its content

## 2019-03-31 NOTE — Progress Notes (Signed)
Phone [phone]   Subjective:  Virtual visit via Video note. Chief complaint: Chief Complaint  Patient presents with   nose pain    x 5days   This visit type was conducted due to national recommendations for restrictions regarding the COVID-19 Pandemic (e.g. social distancing).  This format is felt to be most appropriate for this patient at this time balancing risks to patient and risks to population by having him in for in person visit.  No physical exam was performed (except for noted visual exam or audio findings with Telehealth visits).    Our team/I connected with [name] on 03/31/19 at  1:20 PM EDT by a video enabled telemedicine application ([email or web address]) and verified that I am speaking with the correct person using two identifiers.  Location patient: [facility] Location provider: [facility], office Persons participating in the virtual visit:  patient  Our team/I discussed the limitations of evaluation and management by telemedicine and the availability of in person appointments. In light of current covid-19 pandemic, patient also understands that we are trying to protect them by minimizing in office contact if at all possible.  The patient expressed consent for telemedicine visit and agreed to proceed. Patient understands insurance will be billed.   ROS-no fever, chills, nausea, vomiting. Has sinus pressure and sinus discharge.   Past Medical History-  Patient Active Problem List   Diagnosis Date Noted   Depression 12/19/2016    Priority: High   Anxiety state 07/20/2016    Priority: High   S/p nephrectomy 03/12/2015    Priority: High   Essential hypertension 01/04/2016    Priority: Medium   Gout 03/12/2015    Priority: Medium   Hyperlipidemia 03/12/2015    Priority: Medium   COPD (chronic obstructive pulmonary disease) ([place]) 03/12/2015    Priority: Medium   Arthritis     Priority: Medium   Former smoker     Priority: Medium   MICROALBUMINURIA  01/23/2008    Priority: Medium   Eczema 06/29/2015    Priority: Low   Tinnitus 03/12/2015    Priority: Low   History of colonic polyps 03/12/2015    Priority: Low   Bimalleolar fracture 07/02/2014    Priority: Low   GERD 01/23/2008    Priority: Low   Microscopic hematuria 01/23/2008    Priority: Low   UTI (urinary tract infection) 01/08/2008    Priority: Low   CKD (chronic kidney disease), stage III ([place]) 02/14/2019   Osteitis condensans 06/25/2018   Osteoarthritis of spine with radiculopathy, cervical region 02/12/2018   Intertrigo 01/10/2018   Pes anserinus bursitis of left knee 01/02/2018   Osteitis pubis ([place]) 12/19/2017   Prediabetes 10/22/2017   Other fatigue 09/18/2017   Shortness of breath on exertion 09/18/2017   Vitamin D deficiency 09/18/2017   Hyperglycemia 09/18/2017   Class 2 severe obesity with serious comorbidity and body mass index (BMI) of 35.0 to 35.9 in adult [facility]) 06/13/2017   Atypical chest pain 03/26/2017    Medications- reviewed and updated Current Outpatient Medications  Medication Sig Dispense Refill   acetaminophen (TYLENOL) 500 MG tablet Take 500 mg by mouth every 6 (six) hours as needed.     allopurinol (ZYLOPRIM) 300 MG tablet Take 1 tablet (300 mg total) by mouth 2 (two) times daily. 180 tablet 3   AMBULATORY NON FORMULARY MEDICATION Rolling walker with a seat 1 Units 0   aspirin 81 MG tablet Take 81 mg by mouth daily.     buPROPion [facility] SR)  200 MG 12 hr tablet Take 1 tablet (200 mg total) by mouth daily. 30 tablet 0   CALCIUM-MAGNESIUM-ZINC PO Take by mouth.     clonazePAM (KLONOPIN) 0.5 MG tablet Take 1 tablet (0.5 mg total) by mouth at bedtime as needed. 90 tablet 1   diazepam (VALIUM) 10 MG tablet Take 10 mg by mouth every 6 (six) hours as needed (as needed for muscle spasms).     diclofenac sodium (VOLTAREN) 1 % GEL Apply topically to affected area qid 100 g 2   gabapentin (NEURONTIN) 300 MG capsule  Take 1 capsule, orally, up to TID or as directed 270 capsule 1   HYDROcodone-acetaminophen (NORCO) 5-325 MG tablet Take 1 tablet by mouth every 6 (six) hours as needed. 15 tablet 0   lisinopril (PRINIVIL,ZESTRIL) 2.5 MG tablet TAKE 1 TABLET EVERY DAY 90 tablet 3   nystatin cream (MYCOSTATIN) APPLY 1 APPLICATION TOPICALLY TO RASH IN GROIN TWICE DAILY FOR UP TO 2 WEEKS MAXIMUM 90 g 2   OVER THE COUNTER MEDICATION Cherry extract bid     OVER THE COUNTER MEDICATION B-12 one daily     OVER THE COUNTER MEDICATION Vitamin D     polyethylene glycol powder (GLYCOLAX/MIRALAX) powder Take 17 g by mouth daily. 3350 g 0   sertraline (ZOLOFT) 50 MG tablet TAKE 1 TABLET (50 MG TOTAL) BY MOUTH DAILY. 90 tablet 3   simvastatin (ZOCOR) 20 MG tablet TAKE 1 TABLET AT BEDTIME 90 tablet 0   Sodium Bicarbonate (NICE PURE BAKING SODA) POWD by Does not apply route.     triamcinolone cream (KENALOG) 0.1 % Apply 1 application topically 2 (two) times daily. For 7-10 days for recurring eczema issues 80 g 1   Vitamin D, Ergocalciferol, (DRISDOL) 1.25 MG (50000 UT) CAPS capsule Take 1 capsule (50,000 Units total) by mouth every 7 (seven) days. 4 capsule 0   doxycycline (VIBRA-TABS) 100 MG tablet Take 1 tablet (100 mg total) by mouth 2 (two) times daily for 7 days. 14 tablet 0   meloxicam (MOBIC) 15 MG tablet Take 1 tablet (15 mg total) by mouth daily. Take 1 pill daily X 14 days then as needed 30 tablet 0   traMADol (ULTRAM) 50 MG tablet Take 1 tablet (50 mg total) by mouth every 6 (six) hours as needed for moderate pain or severe pain. 60 tablet 1   No current facility-administered medications for this visit.      Objective:  Ht 5\' 8"  (1.727 m)    Wt 243 lb (110.2 kg)    BMI 36.95 kg/m  Gen: NAD, resting comfortably Points to right maxillary sinus as source of pain. No visible rhinorrhea/sinus discharge Lungs: nonlabored, normal respiratory rate  Skin: appears dry, no obvious rash     Assessment and  Plan   # bacterial sinusitis S:2 weeks ago noted fever blister on right lip then started with sinus issues with weather changes. Started corricidin with no help. Pain in right maxillary sinus- has not noted blood or bumps with q tips. Area is tender when she touches it. Doesn't note swelling.   2 weeks of sinus issues for about 2 weeks and then 5 days of worsening pain here   Denies fever or signficant cough. Has clear mixed with yellow discharge- mainly clear today  Some postnasal drip - cough only if doesn't take claritin.   Has had sore throat going up into left ear- once gets up and moving it gets better.   Oldest son from [place]  here with his 2 teenagers. Has been here 6 weeks. They are all staying at home for most part- no known covid 19 exposure.  A/P: I am most concerned about bacterial sinusitis with 2 weeks of sinus issues and now with worsening sinus pressure. She has nausea on penicillin- will see if she can tolerate doxycycline for 7 days.   Unfortunately, we cannot exclude covid-19 as potential cause of symptoms. Lower risk as has stayed in her home.  Therefore: - recommended patient watch closely for shortness of breath or confusion or worsening symptoms and if those occur he should contact us immediately -recommended self isolation for 7 days PLUS symptom free for at least 3 days  - work note not needed  Future Appointments  Date Time Provider [place]  03/31/2019  1:20 PM Marin Olp, MD [facility]  04/15/2019 12:30 PM Abby Potash, PA-C [facility] None   Lab/Order associations: Bacterial sinusitis  Sinus pressure  Educated About Covid-19 Virus Infection  Meds ordered this encounter  Medications   doxycycline (VIBRA-TABS) 100 MG tablet    Sig: Take 1 tablet (100 mg total) by mouth 2 (two) times daily for 7 days.    Dispense:  14 tablet    Refill:  0   Return precautions advised.  Garret Reddish, MD

## 2019-04-03 ENCOUNTER — Encounter: Payer: Self-pay | Admitting: Family Medicine

## 2019-04-04 ENCOUNTER — Encounter: Payer: Self-pay | Admitting: Family Medicine

## 2019-04-04 NOTE — Telephone Encounter (Signed)
Please see message and advise.  Thank you. ° °

## 2019-04-04 NOTE — Telephone Encounter (Signed)
FYI Spoke to pt and she stated that her throat has started hurting more. Pt stated she has several days left on the abx. I asked pt per Dr. Jerline Pain if she wanted to wait until she finish the abx to see how she is feeling or she could be seen today. PT declined visit today and stated she think it may be from the post nasal drip. Pt stated that her son has been giving her ginger ale with ice and that has been helping a lot. PT claimed the only reason she reached out is because Dr.Hunter advised her that if her throat keeps hurting to let him know. Pt stated she will call Monday if no better.

## 2019-04-13 ENCOUNTER — Encounter: Payer: Self-pay | Admitting: Family Medicine

## 2019-04-15 ENCOUNTER — Other Ambulatory Visit: Payer: Self-pay

## 2019-04-15 ENCOUNTER — Ambulatory Visit (INDEPENDENT_AMBULATORY_CARE_PROVIDER_SITE_OTHER): Payer: Medicare HMO | Admitting: Physician Assistant

## 2019-04-15 DIAGNOSIS — E559 Vitamin D deficiency, unspecified: Secondary | ICD-10-CM

## 2019-04-15 DIAGNOSIS — Z6837 Body mass index (BMI) 37.0-37.9, adult: Secondary | ICD-10-CM

## 2019-04-15 NOTE — Telephone Encounter (Signed)
Visit has been scheduled for today!

## 2019-04-15 NOTE — Progress Notes (Signed)
Office: [phone]  /  Fax: [phone] TeleHealth Visit:  Angie Velasquez has verbally consented to this TeleHealth visit today. The patient is located at home, the provider is located at the [employer] [facility] office. The participants in this visit include the listed provider and patient. The visit was conducted today via Webex.  HPI:   Chief Complaint: OBESITY Angie Velasquez is here to discuss her progress with her obesity treatment plan. She is on the Category 2 plan and is following her eating plan approximately 50% of the time. She states she is walking 30 minutes 7 times per week. Angie Velasquez reports her weight today to be 243 lbs. She is getting back to her routine now that her family has gone back to [place]. She is working to get all of her protein in and sometimes overeating her snack calories.  We were unable to weigh the patient today for this TeleHealth visit. She feels as if she has maintained her weight (243 lbs) since her last visit. She has lost 5 lbs since starting treatment with [name].  Vitamin D deficiency Angie Velasquez has a diagnosis of Vitamin D deficiency. She is currently taking Vit D and denies nausea, vomiting or muscle weakness.  ASSESSMENT AND PLAN:  Vitamin D deficiency  Class 2 severe obesity with serious comorbidity and body mass index (BMI) of 37.0 to 37.9 in adult, unspecified obesity type ([place])  PLAN:  Vitamin D Deficiency Angie Velasquez was informed that low Vitamin D levels contributes to fatigue and are associated with obesity, breast, and colon cancer. She agrees to continue taking Vit D and will follow-up for routine testing of Vitamin D, at least 2-3 times per year. She was informed of the risk of over-replacement of Vitamin D and agrees to not increase her dose unless she discusses this with [name] first. Angie Velasquez agrees to follow-up with our clinic in 2 weeks.  Obesity Leaner is currently in the action stage of change. As such, her goal is to  continue with weight loss efforts. She has agreed to follow the Category 2 plan. Angie Velasquez has been instructed to work up to a goal of 150 minutes of combined cardio and strengthening exercise per week for weight loss and overall health benefits. We discussed the following Behavioral Modification Strategies today: work on meal planning, easy cooking plans, and keeping healthy foods in the home.  Angie Velasquez has agreed to follow-up with our clinic in 2 weeks. She was informed of the importance of frequent follow-up visits to maximize her success with intensive lifestyle modifications for her multiple health conditions.  ALLERGIES: Allergies  Allergen Reactions  . Epinephrine Palpitations  . Codeine Nausea Only  . Penicillins Nausea Only    MEDICATIONS: Current Outpatient Medications on File Prior to Visit  Medication Sig Dispense Refill  . acetaminophen (TYLENOL) 500 MG tablet Take 500 mg by mouth every 6 (six) hours as needed.    [name] allopurinol (ZYLOPRIM) 300 MG tablet Take 1 tablet (300 mg total) by mouth 2 (two) times daily. 180 tablet 3  . AMBULATORY NON FORMULARY MEDICATION Rolling walker with a seat 1 Units 0  . aspirin 81 MG tablet Take 81 mg by mouth daily.    [name] buPROPion (WELLBUTRIN SR) 200 MG 12 hr tablet Take 1 tablet (200 mg total) by mouth daily. 30 tablet 0  . CALCIUM-MAGNESIUM-ZINC PO Take by mouth.    . clonazePAM (KLONOPIN) 0.5 MG tablet Take 1 tablet (0.5 mg total) by mouth at bedtime as needed. 90 tablet 1  .  diazepam (VALIUM) 10 MG tablet Take 10 mg by mouth every 6 (six) hours as needed (as needed for muscle spasms).    . diclofenac sodium (VOLTAREN) 1 % GEL Apply topically to affected area qid 100 g 2  . gabapentin (NEURONTIN) 300 MG capsule Take 1 capsule, orally, up to TID or as directed 270 capsule 1  . HYDROcodone-acetaminophen (NORCO) 5-325 MG tablet Take 1 tablet by mouth every 6 (six) hours as needed. 15 tablet 0  . lisinopril (PRINIVIL,ZESTRIL) 2.5 MG tablet TAKE 1  TABLET EVERY DAY 90 tablet 3  . meloxicam (MOBIC) 15 MG tablet TAKE 1 TABLET BY MOUTH DAILY FOR 14 DAYS, THEN AS NEEDED 90 tablet 0  . nystatin cream (MYCOSTATIN) APPLY 1 APPLICATION TOPICALLY TO RASH IN GROIN TWICE DAILY FOR UP TO 2 WEEKS MAXIMUM 90 g 2  . OVER THE COUNTER MEDICATION Cherry extract bid    . OVER THE COUNTER MEDICATION B-12 one daily    . OVER THE COUNTER MEDICATION Vitamin D    . polyethylene glycol powder (GLYCOLAX/MIRALAX) powder Take 17 g by mouth daily. 3350 g 0  . sertraline (ZOLOFT) 50 MG tablet TAKE 1 TABLET (50 MG TOTAL) BY MOUTH DAILY. 90 tablet 3  . simvastatin (ZOCOR) 20 MG tablet TAKE 1 TABLET AT BEDTIME 90 tablet 0  . Sodium Bicarbonate (NICE PURE BAKING SODA) POWD by Does not apply route.    . traMADol (ULTRAM) 50 MG tablet Take 1 tablet (50 mg total) by mouth every 6 (six) hours as needed for moderate pain or severe pain. 60 tablet 1  . triamcinolone cream (KENALOG) 0.1 % Apply 1 application topically 2 (two) times daily. For 7-10 days for recurring eczema issues 80 g 1  . Vitamin D, Ergocalciferol, (DRISDOL) 1.25 MG (50000 UT) CAPS capsule Take 1 capsule (50,000 Units total) by mouth every 7 (seven) days. 4 capsule 0   No current facility-administered medications on file prior to visit.     PAST MEDICAL HISTORY: Past Medical History:  Diagnosis Date  . Anxiety    years ago - no longer an issue  . Arthritis    gout  . Cataracts, bilateral   . Complication of anesthesia   . Fracture of pelvis ([place])   . GERD (gastroesophageal reflux disease)   . Hx of chest pain    "anxiety"  . Hyperlipidemia   . INSOMNIA 10/14/2008   in past  . Joint pain   . Kidney problem   . Low blood sugar    eats several smaller meals a day  . Obesity   . Palpitations    Stress related   . Proteinuria    H/O  . Tobacco abuse    quit smoking 08/2013    PAST SURGICAL HISTORY: Past Surgical History:  Procedure Laterality Date  . ABDOMINAL HYSTERECTOMY    .  APPENDECTOMY     with hysterectomy  . childbirth     x4  . CHOLECYSTECTOMY  2001  . EYE SURGERY Bilateral 2015   cataract surgery with lens implant  . NEPHRECTOMY  1992   complex cystic mass - nephrectomy right  . ORIF ANKLE FRACTURE Right 07/02/2014   Procedure: OPEN REDUCTION INTERNAL FIXATION (ORIF) RIGHT ANKLE BIMALLOELAR FRACTURE;  Surgeon: Wylene Simmer, MD;  Location: [facility];  Service: Orthopedics;  Laterality: Right;  . OTHER SURGICAL HISTORY     hysterectomy, R ovary remains  . SHOULDER SURGERY     bone spurs left    SOCIAL HISTORY: Social  History   Tobacco Use  . Smoking status: Former Smoker    Packs/day: 1.00    Years: 35.00    Pack years: 35.00    Types: Cigarettes    Last attempt to quit: 09/01/2013    Years since quitting: 5.6  . Smokeless tobacco: Never Used  . Tobacco comment: Former smoker with no desire to smoke again. Smoking years edited at patients request to include periods when she did not smoke.  Substance Use Topics  . Alcohol use: Yes    Alcohol/week: 1.0 standard drinks    Types: 1 Shots of liquor per week  . Drug use: No    FAMILY HISTORY: Family History  Problem Relation Age of Onset  . Stroke Father   . Alzheimer's disease Father        early, states also parkinsons. died 21  . Hypertension Father   . Obesity Father   . Ovarian cancer Mother        Dr. Jana Hakim   . Arthritis Mother   . Obesity Mother   . Colon cancer Paternal Grandmother   . Liver disease Daughter        On hospice age 55   ROS: Review of Systems  Gastrointestinal: Negative for nausea and vomiting.  Musculoskeletal:       Negative for muscle weakness.   PHYSICAL EXAM: Pt in no acute distress  RECENT LABS AND TESTS: BMET    Component Value Date/Time   NA 141 01/09/2019 1126   K 4.5 01/09/2019 1126   CL 101 01/09/2019 1126   CO2 22 01/09/2019 1126   GLUCOSE 107 (H) 01/09/2019 1126   GLUCOSE 109 (H) 01/18/2017 0902   BUN 12 01/09/2019 1126   CREATININE  1.07 (H) 01/09/2019 1126   CALCIUM 9.7 01/09/2019 1126   GFRNONAA 52 (L) 01/09/2019 1126   GFRAA 60 01/09/2019 1126   Lab Results  Component Value Date   HGBA1C 5.7 (H) 01/09/2019   HGBA1C 5.8 (H) 07/25/2018   HGBA1C 5.7 (H) 09/18/2017   HGBA1C (H) 04/15/2010    5.9 (NOTE)                                                                       According to the ADA Clinical Practice Recommendations for 2011, when HbA1c is used as a screening test:   >=6.5%   Diagnostic of Diabetes Mellitus           (if abnormal result  is confirmed)  5.7-6.4%   Increased risk of developing Diabetes Mellitus  References:Diagnosis and Classification of Diabetes Mellitus,Diabetes JJKK,9381,82(XHBZJ 1):S62-S69 and Standards of Medical Care in         Diabetes - 2011,Diabetes IRCV,8938,10  (Suppl 1):S11-S61.   HGBA1C 5.8 12/31/2007   Lab Results  Component Value Date   INSULIN 18.6 01/09/2019   INSULIN 22.0 07/25/2018   INSULIN 25.3 (H) 09/18/2017   CBC    Component Value Date/Time   WBC 6.4 09/18/2017 1049   WBC 6.6 01/18/2017 0902   RBC 4.57 09/18/2017 1049   RBC 4.39 01/18/2017 0902   HGB 13.5 09/18/2017 1049   HCT 40.4 09/18/2017 1049   PLT 240.0 01/18/2017 0902   MCV 88 09/18/2017 1049   MCH 29.5 09/18/2017  1049   MCH 29.4 07/02/2014 1143   MCHC 33.4 09/18/2017 1049   MCHC 33.2 01/18/2017 0902   RDW 15.2 09/18/2017 1049   LYMPHSABS 2.6 09/18/2017 1049   MONOABS 0.9 04/15/2010 2135   EOSABS 0.2 09/18/2017 1049   BASOSABS 0.0 09/18/2017 1049   Iron/TIBC/Ferritin/ %Sat No results found for: IRON, TIBC, FERRITIN, IRONPCTSAT Lipid Panel     Component Value Date/Time   CHOL 215 (H) 01/09/2019 1126   TRIG 210 (H) 01/09/2019 1126   HDL 61 01/09/2019 1126   CHOLHDL 2 01/18/2017 0902   VLDL 28.2 01/18/2017 0902   LDLCALC 112 (H) 01/09/2019 1126   LDLDIRECT 77.0 01/18/2017 0902   Hepatic Function Panel     Component Value Date/Time   PROT 7.9 01/09/2019 1126   ALBUMIN 4.6 01/09/2019  1126   AST 29 01/09/2019 1126   ALT 28 01/09/2019 1126   ALKPHOS 94 01/09/2019 1126   BILITOT 0.3 01/09/2019 1126   BILIDIR <0.1 04/15/2010 2135   IBILI NOT CALCULATED 04/15/2010 2135      Component Value Date/Time   TSH 3.430 09/18/2017 1049   TSH 2.09 05/25/2015 0820   TSH 1.89 12/31/2007 0831   Results for Judie Bonus ELIZABETH "[name]" (MRN [id number]) as of 04/15/2019 15:28  Ref. Range 01/09/2019 11:26  Vitamin D, 25-Hydroxy Latest Ref Range: 30.0 - 100.0 ng/mL 39.4   I, Michaelene Song, am acting as Location manager for [employer], PA-C I, Abby Potash, PA-C have reviewed above note and agree with its content

## 2019-04-23 ENCOUNTER — Encounter: Payer: Self-pay | Admitting: Family Medicine

## 2019-04-23 MED ORDER — DOXYCYCLINE HYCLATE 100 MG PO CAPS: 100.0000 mg | ORAL_CAPSULE | Freq: Two times a day (BID) | ORAL | 0 refills | Status: DC

## 2019-04-23 NOTE — Telephone Encounter (Signed)
Please see message, pt was seen 4/27.

## 2019-04-25 NOTE — Telephone Encounter (Signed)
Called pt and advised regarding Tylenol Sinus per Dr. Yong Channel.

## 2019-04-29 ENCOUNTER — Encounter (INDEPENDENT_AMBULATORY_CARE_PROVIDER_SITE_OTHER): Payer: Self-pay | Admitting: Physician Assistant

## 2019-05-01 ENCOUNTER — Ambulatory Visit (INDEPENDENT_AMBULATORY_CARE_PROVIDER_SITE_OTHER): Payer: Medicare HMO | Admitting: Physician Assistant

## 2019-05-01 ENCOUNTER — Other Ambulatory Visit: Payer: Self-pay

## 2019-05-01 ENCOUNTER — Encounter (INDEPENDENT_AMBULATORY_CARE_PROVIDER_SITE_OTHER): Payer: Self-pay | Admitting: Physician Assistant

## 2019-05-01 DIAGNOSIS — E559 Vitamin D deficiency, unspecified: Secondary | ICD-10-CM | POA: Diagnosis not present

## 2019-05-01 DIAGNOSIS — Z6837 Body mass index (BMI) 37.0-37.9, adult: Secondary | ICD-10-CM

## 2019-05-01 NOTE — Progress Notes (Signed)
Office: [phone]  /  Fax: [phone] TeleHealth Visit:  Angie Velasquez has verbally consented to this TeleHealth visit today. The patient is located at home, the provider is located at the [employer] [facility] office. The participants in this visit include the listed provider and patient and any and all parties involved. The visit was conducted today via WebEx.  HPI:   Chief Complaint: OBESITY Angie Velasquez is here to discuss her progress with her obesity treatment plan. She is on the Category 2 plan and is following her eating plan approximately 70 % of the time. She states she is exercising 0 minutes 0 times per week. Angie Velasquez reports her most recent weight is 243 pounds. She is still struggling to get all of her protein in daily. She is traveling to [place] next week to see her family. We were unable to weigh the patient today for this TeleHealth visit. She feels as if she has maintained weight since her last visit. She has lost 6 lbs since starting treatment with [name].  Vitamin D deficiency Angie Velasquez has a diagnosis of vitamin D deficiency. She is currently taking vit D and denies nausea, vomiting or muscle weakness.  ASSESSMENT AND PLAN:  Vitamin D deficiency  Class 2 severe obesity with serious comorbidity and body mass index (BMI) of 37.0 to 37.9 in adult, unspecified obesity type ([place])  PLAN:  Vitamin D Deficiency Angie Velasquez was informed that low vitamin D levels contributes to fatigue and are associated with obesity, breast, and colon cancer. She will continue to take prescription Vit D @50 ,000 IU every week and will follow up for routine testing of vitamin D, at least 2-3 times per year. She was informed of the risk of over-replacement of vitamin D and agrees to not increase her dose unless she discusses this with [name] first.  Obesity Angie Velasquez is currently in the action stage of change. As such, her goal is to continue with weight loss efforts She has agreed to  follow the Category 2 plan Angie Velasquez has been instructed to work up to a goal of 150 minutes of combined cardio and strengthening exercise per week for weight loss and overall health benefits. We discussed the following Behavioral Modification Strategies today: keeping healthy foods in the home and work on meal planning and easy cooking plans  [name] has agreed to follow up with our clinic in 2 weeks. She was informed of the importance of frequent follow up visits to maximize her success with intensive lifestyle modifications for her multiple health conditions.  ALLERGIES: Allergies  Allergen Reactions  . Epinephrine Palpitations  . Codeine Nausea Only  . Penicillins Nausea Only    MEDICATIONS: Current Outpatient Medications on File Prior to Visit  Medication Sig Dispense Refill  . acetaminophen (TYLENOL) 500 MG tablet Take 500 mg by mouth every 6 (six) hours as needed.    [name] allopurinol (ZYLOPRIM) 300 MG tablet Take 1 tablet (300 mg total) by mouth 2 (two) times daily. 180 tablet 3  . AMBULATORY NON FORMULARY MEDICATION Rolling walker with a seat 1 Units 0  . aspirin 81 MG tablet Take 81 mg by mouth daily.    [name] buPROPion (WELLBUTRIN SR) 200 MG 12 hr tablet Take 1 tablet (200 mg total) by mouth daily. 30 tablet 0  . CALCIUM-MAGNESIUM-ZINC PO Take by mouth.    . clonazePAM (KLONOPIN) 0.5 MG tablet Take 1 tablet (0.5 mg total) by mouth at bedtime as needed. 90 tablet 1  . diazepam (VALIUM) 10 MG tablet  Take 10 mg by mouth every 6 (six) hours as needed (as needed for muscle spasms).    . diclofenac sodium (VOLTAREN) 1 % GEL Apply topically to affected area qid 100 g 2  . doxycycline (VIBRAMYCIN) 100 MG capsule Take 1 capsule (100 mg total) by mouth 2 (two) times daily. 20 capsule 0  . gabapentin (NEURONTIN) 300 MG capsule Take 1 capsule, orally, up to TID or as directed 270 capsule 1  . HYDROcodone-acetaminophen (NORCO) 5-325 MG tablet Take 1 tablet by mouth every 6 (six) hours as needed. 15  tablet 0  . lisinopril (PRINIVIL,ZESTRIL) 2.5 MG tablet TAKE 1 TABLET EVERY DAY 90 tablet 3  . meloxicam (MOBIC) 15 MG tablet TAKE 1 TABLET BY MOUTH DAILY FOR 14 DAYS, THEN AS NEEDED 90 tablet 0  . nystatin cream (MYCOSTATIN) APPLY 1 APPLICATION TOPICALLY TO RASH IN GROIN TWICE DAILY FOR UP TO 2 WEEKS MAXIMUM 90 g 2  . OVER THE COUNTER MEDICATION Cherry extract bid    . OVER THE COUNTER MEDICATION B-12 one daily    . OVER THE COUNTER MEDICATION Vitamin D    . polyethylene glycol powder (GLYCOLAX/MIRALAX) powder Take 17 g by mouth daily. 3350 g 0  . sertraline (ZOLOFT) 50 MG tablet TAKE 1 TABLET (50 MG TOTAL) BY MOUTH DAILY. 90 tablet 3  . simvastatin (ZOCOR) 20 MG tablet TAKE 1 TABLET AT BEDTIME 90 tablet 0  . Sodium Bicarbonate (NICE PURE BAKING SODA) POWD by Does not apply route.    . traMADol (ULTRAM) 50 MG tablet Take 1 tablet (50 mg total) by mouth every 6 (six) hours as needed for moderate pain or severe pain. 60 tablet 1  . triamcinolone cream (KENALOG) 0.1 % Apply 1 application topically 2 (two) times daily. For 7-10 days for recurring eczema issues 80 g 1  . Vitamin D, Ergocalciferol, (DRISDOL) 1.25 MG (50000 UT) CAPS capsule Take 1 capsule (50,000 Units total) by mouth every 7 (seven) days. 4 capsule 0   No current facility-administered medications on file prior to visit.     PAST MEDICAL HISTORY: Past Medical History:  Diagnosis Date  . Anxiety    years ago - no longer an issue  . Arthritis    gout  . Cataracts, bilateral   . Complication of anesthesia   . Fracture of pelvis ([place])   . GERD (gastroesophageal reflux disease)   . Hx of chest pain    "anxiety"  . Hyperlipidemia   . INSOMNIA 10/14/2008   in past  . Joint pain   . Kidney problem   . Low blood sugar    eats several smaller meals a day  . Obesity   . Palpitations    Stress related   . Proteinuria    H/O  . Tobacco abuse    quit smoking 08/2013    PAST SURGICAL HISTORY: Past Surgical History:   Procedure Laterality Date  . ABDOMINAL HYSTERECTOMY    . APPENDECTOMY     with hysterectomy  . childbirth     x4  . CHOLECYSTECTOMY  2001  . EYE SURGERY Bilateral 2015   cataract surgery with lens implant  . NEPHRECTOMY  1992   complex cystic mass - nephrectomy right  . ORIF ANKLE FRACTURE Right 07/02/2014   Procedure: OPEN REDUCTION INTERNAL FIXATION (ORIF) RIGHT ANKLE BIMALLOELAR FRACTURE;  Surgeon: Wylene Simmer, MD;  Location: [facility];  Service: Orthopedics;  Laterality: Right;  . OTHER SURGICAL HISTORY     hysterectomy, R ovary remains  .  SHOULDER SURGERY     bone spurs left    SOCIAL HISTORY: Social History   Tobacco Use  . Smoking status: Former Smoker    Packs/day: 1.00    Years: 35.00    Pack years: 35.00    Types: Cigarettes    Last attempt to quit: 09/01/2013    Years since quitting: 5.6  . Smokeless tobacco: Never Used  . Tobacco comment: Former smoker with no desire to smoke again. Smoking years edited at patients request to include periods when she did not smoke.  Substance Use Topics  . Alcohol use: Yes    Alcohol/week: 1.0 standard drinks    Types: 1 Shots of liquor per week  . Drug use: No    FAMILY HISTORY: Family History  Problem Relation Age of Onset  . Stroke Father   . Alzheimer's disease Father        early, states also parkinsons. died 74  . Hypertension Father   . Obesity Father   . Ovarian cancer Mother        Dr. Jana Hakim   . Arthritis Mother   . Obesity Mother   . Colon cancer Paternal Grandmother   . Liver disease Daughter        On hospice age 30    ROS: Review of Systems  Constitutional: Negative for weight loss.  Gastrointestinal: Negative for nausea and vomiting.  Musculoskeletal:       Negative for muscle weakness    PHYSICAL EXAM: Pt in no acute distress  RECENT LABS AND TESTS: BMET    Component Value Date/Time   NA 141 01/09/2019 1126   K 4.5 01/09/2019 1126   CL 101 01/09/2019 1126   CO2 22 01/09/2019 1126    GLUCOSE 107 (H) 01/09/2019 1126   GLUCOSE 109 (H) 01/18/2017 0902   BUN 12 01/09/2019 1126   CREATININE 1.07 (H) 01/09/2019 1126   CALCIUM 9.7 01/09/2019 1126   GFRNONAA 52 (L) 01/09/2019 1126   GFRAA 60 01/09/2019 1126   Lab Results  Component Value Date   HGBA1C 5.7 (H) 01/09/2019   HGBA1C 5.8 (H) 07/25/2018   HGBA1C 5.7 (H) 09/18/2017   HGBA1C (H) 04/15/2010    5.9 (NOTE)                                                                       According to the ADA Clinical Practice Recommendations for 2011, when HbA1c is used as a screening test:   >=6.5%   Diagnostic of Diabetes Mellitus           (if abnormal result  is confirmed)  5.7-6.4%   Increased risk of developing Diabetes Mellitus  References:Diagnosis and Classification of Diabetes Mellitus,Diabetes ZOXW,9604,54(UJWJX 1):S62-S69 and Standards of Medical Care in         Diabetes - 2011,Diabetes BJYN,8295,62  (Suppl 1):S11-S61.   HGBA1C 5.8 12/31/2007   Lab Results  Component Value Date   INSULIN 18.6 01/09/2019   INSULIN 22.0 07/25/2018   INSULIN 25.3 (H) 09/18/2017   CBC    Component Value Date/Time   WBC 6.4 09/18/2017 1049   WBC 6.6 01/18/2017 0902   RBC 4.57 09/18/2017 1049   RBC 4.39 01/18/2017 0902   HGB 13.5 09/18/2017 1049  HCT 40.4 09/18/2017 1049   PLT 240.0 01/18/2017 0902   MCV 88 09/18/2017 1049   MCH 29.5 09/18/2017 1049   MCH 29.4 07/02/2014 1143   MCHC 33.4 09/18/2017 1049   MCHC 33.2 01/18/2017 0902   RDW 15.2 09/18/2017 1049   LYMPHSABS 2.6 09/18/2017 1049   MONOABS 0.9 04/15/2010 2135   EOSABS 0.2 09/18/2017 1049   BASOSABS 0.0 09/18/2017 1049   Iron/TIBC/Ferritin/ %Sat No results found for: IRON, TIBC, FERRITIN, IRONPCTSAT Lipid Panel     Component Value Date/Time   CHOL 215 (H) 01/09/2019 1126   TRIG 210 (H) 01/09/2019 1126   HDL 61 01/09/2019 1126   CHOLHDL 2 01/18/2017 0902   VLDL 28.2 01/18/2017 0902   LDLCALC 112 (H) 01/09/2019 1126   LDLDIRECT 77.0 01/18/2017 0902    Hepatic Function Panel     Component Value Date/Time   PROT 7.9 01/09/2019 1126   ALBUMIN 4.6 01/09/2019 1126   AST 29 01/09/2019 1126   ALT 28 01/09/2019 1126   ALKPHOS 94 01/09/2019 1126   BILITOT 0.3 01/09/2019 1126   BILIDIR <0.1 04/15/2010 2135   IBILI NOT CALCULATED 04/15/2010 2135      Component Value Date/Time   TSH 3.430 09/18/2017 1049   TSH 2.09 05/25/2015 0820   TSH 1.89 12/31/2007 0831    Results for Judie Bonus ELIZABETH "[name]" (MRN [id number]) as of 05/01/2019 18:22  Ref. Range 01/09/2019 11:26  Vitamin D, 25-Hydroxy Latest Ref Range: 30.0 - 100.0 ng/mL 39.4    I, Doreene Nest, am acting as Location manager for [employer], PA-C I, Abby Potash, PA-C have reviewed above note and agree with its content

## 2019-05-02 ENCOUNTER — Other Ambulatory Visit: Payer: Self-pay

## 2019-05-02 DIAGNOSIS — F329 Major depressive disorder, single episode, unspecified: Secondary | ICD-10-CM

## 2019-05-02 MED ORDER — SERTRALINE HCL 50 MG PO TABS: 50.0000 mg | ORAL_TABLET | Freq: Every day | ORAL | 3 refills | Status: DC

## 2019-05-02 NOTE — Progress Notes (Signed)
Received fax from [facility] for a refill request for sertraline 50 mg.  Refill sent in to [facility].

## 2019-05-07 ENCOUNTER — Encounter: Payer: Self-pay | Admitting: Family Medicine

## 2019-05-14 ENCOUNTER — Encounter (INDEPENDENT_AMBULATORY_CARE_PROVIDER_SITE_OTHER): Payer: Self-pay | Admitting: Physician Assistant

## 2019-05-14 ENCOUNTER — Ambulatory Visit (INDEPENDENT_AMBULATORY_CARE_PROVIDER_SITE_OTHER): Payer: Medicare HMO | Admitting: Physician Assistant

## 2019-05-14 ENCOUNTER — Other Ambulatory Visit: Payer: Self-pay

## 2019-05-14 DIAGNOSIS — E7849 Other hyperlipidemia: Secondary | ICD-10-CM | POA: Diagnosis not present

## 2019-05-14 DIAGNOSIS — Z6837 Body mass index (BMI) 37.0-37.9, adult: Secondary | ICD-10-CM

## 2019-05-14 DIAGNOSIS — E559 Vitamin D deficiency, unspecified: Secondary | ICD-10-CM

## 2019-05-15 MED ORDER — VITAMIN D (ERGOCALCIFEROL) 1.25 MG (50000 UNIT) PO CAPS: 50000.0000 [IU] | ORAL_CAPSULE | ORAL | 0 refills | Status: DC

## 2019-05-15 NOTE — Progress Notes (Signed)
Office: [phone]  /  Fax: [phone] TeleHealth Visit:  Angie Velasquez has verbally consented to this TeleHealth visit today. The patient is located in her car, the provider is located at the [employer] [facility] office. The participants in this visit include the listed provider and patient and any and all parties involved. The visit was conducted today via telephone. Angie Velasquez was unable to use realtime audiovisual technology today and the telehealth visit was conducted via telephone.  HPI:   Chief Complaint: OBESITY [name] is here to discuss her progress with her obesity treatment plan. She is on the Category 2 plan and is following her eating plan approximately 50 % of the time. She states she is exercising 0 minutes 0 times per week. [name]'s most recent weight is 243 pounds. She is going to [place] to be with her family for two weeks. She states they eat healthy, and she will be able to work all of her meals there. We were unable to weigh the patient today for this TeleHealth visit. She feels as if she has maintained weight since her last visit. She has lost 6 lbs since starting treatment with Angie Velasquez.  Vitamin D deficiency [name] has a diagnosis of vitamin D deficiency. She is currently taking vit D and denies nausea, vomiting or muscle weakness.  Hyperlipidemia [name] has hyperlipidemia and she is on Zocor. She has been trying to improve her cholesterol levels with intensive lifestyle modification including a low saturated fat diet, exercise and weight loss. She denies any chest pain.  ASSESSMENT AND PLAN:  Vitamin D deficiency - Plan: Vitamin D, Ergocalciferol, (DRISDOL) 1.25 MG (50000 UT) CAPS capsule  Other hyperlipidemia  Class 2 severe obesity with serious comorbidity and body mass index (BMI) of 37.0 to 37.9 in adult, unspecified obesity type ([place])  PLAN:  Vitamin D Deficiency Angie Velasquez was informed that low vitamin D levels contributes to fatigue and  are associated with obesity, breast, and colon cancer. She agrees to continue to take prescription Vit D @50 ,000 IU every week #4 with no refills and will follow up for routine testing of vitamin D, at least 2-3 times per year. She was informed of the risk of over-replacement of vitamin D and agrees to not increase her dose unless she discusses this with Angie Velasquez first. Angie Velasquez agrees to follow up as directed.  Hyperlipidemia [name] was informed of the American Heart Association Guidelines emphasizing intensive lifestyle modifications as the first line treatment for hyperlipidemia. We discussed many lifestyle modifications today in depth, and [name] will continue to work on decreasing saturated fats such as fatty red meat, butter and many fried foods. She will also increase vegetables and lean protein in her diet and continue to work on exercise and weight loss efforts. Stori will continue with Zocor and follow up as directed.  Obesity [name] is currently in the action stage of change. As such, her goal is to continue with weight loss efforts She has agreed to follow the Category 2 plan Angie Velasquez has been instructed to work up to a goal of 150 minutes of combined cardio and strengthening exercise per week for weight loss and overall health benefits. We discussed the following Behavioral Modification Strategies today: keeping healthy foods in the home and work on meal planning and easy cooking plans  Angie Velasquez has agreed to follow up with our clinic in 2 weeks. She was informed of the importance of frequent follow up visits to maximize her success with intensive lifestyle modifications for her multiple  health conditions.  ALLERGIES: Allergies  Allergen Reactions   Epinephrine Palpitations   Codeine Nausea Only   Penicillins Nausea Only    MEDICATIONS: Current Outpatient Medications on File Prior to Visit  Medication Sig Dispense Refill   acetaminophen (TYLENOL) 500 MG tablet Take 500 mg by  mouth every 6 (six) hours as needed.     allopurinol (ZYLOPRIM) 300 MG tablet Take 1 tablet (300 mg total) by mouth 2 (two) times daily. 180 tablet 3   AMBULATORY NON FORMULARY MEDICATION Rolling walker with a seat 1 Units 0   aspirin 81 MG tablet Take 81 mg by mouth daily.     buPROPion (WELLBUTRIN SR) 200 MG 12 hr tablet Take 1 tablet (200 mg total) by mouth daily. 30 tablet 0   CALCIUM-MAGNESIUM-ZINC PO Take by mouth.     clonazePAM (KLONOPIN) 0.5 MG tablet Take 1 tablet (0.5 mg total) by mouth at bedtime as needed. 90 tablet 1   diazepam (VALIUM) 10 MG tablet Take 10 mg by mouth every 6 (six) hours as needed (as needed for muscle spasms).     diclofenac sodium (VOLTAREN) 1 % GEL Apply topically to affected area qid 100 g 2   doxycycline (VIBRAMYCIN) 100 MG capsule Take 1 capsule (100 mg total) by mouth 2 (two) times daily. 20 capsule 0   gabapentin (NEURONTIN) 300 MG capsule Take 1 capsule, orally, up to TID or as directed 270 capsule 1   HYDROcodone-acetaminophen (NORCO) 5-325 MG tablet Take 1 tablet by mouth every 6 (six) hours as needed. 15 tablet 0   lisinopril (PRINIVIL,ZESTRIL) 2.5 MG tablet TAKE 1 TABLET EVERY DAY 90 tablet 3   meloxicam (MOBIC) 15 MG tablet TAKE 1 TABLET BY MOUTH DAILY FOR 14 DAYS, THEN AS NEEDED 90 tablet 0   nystatin cream (MYCOSTATIN) APPLY 1 APPLICATION TOPICALLY TO RASH IN GROIN TWICE DAILY FOR UP TO 2 WEEKS MAXIMUM 90 g 2   OVER THE COUNTER MEDICATION Cherry extract bid     OVER THE COUNTER MEDICATION B-12 one daily     OVER THE COUNTER MEDICATION Vitamin D     polyethylene glycol powder (GLYCOLAX/MIRALAX) powder Take 17 g by mouth daily. 3350 g 0   sertraline (ZOLOFT) 50 MG tablet Take 1 tablet (50 mg total) by mouth daily. 90 tablet 3   simvastatin (ZOCOR) 20 MG tablet TAKE 1 TABLET AT BEDTIME 90 tablet 0   Sodium Bicarbonate (NICE PURE BAKING SODA) POWD by Does not apply route.     traMADol (ULTRAM) 50 MG tablet Take 1 tablet (50 mg  total) by mouth every 6 (six) hours as needed for moderate pain or severe pain. 60 tablet 1   triamcinolone cream (KENALOG) 0.1 % Apply 1 application topically 2 (two) times daily. For 7-10 days for recurring eczema issues 80 g 1   No current facility-administered medications on file prior to visit.     PAST MEDICAL HISTORY: Past Medical History:  Diagnosis Date   Anxiety    years ago - no longer an issue   Arthritis    gout   Cataracts, bilateral    Complication of anesthesia    Fracture of pelvis ([facility])    GERD (gastroesophageal reflux disease)    Hx of chest pain    "anxiety"   Hyperlipidemia    INSOMNIA 10/14/2008   in past   Joint pain    Kidney problem    Low blood sugar    eats several smaller meals a day  Obesity    Palpitations    Stress related    Proteinuria    H/O   Tobacco abuse    quit smoking 08/2013    PAST SURGICAL HISTORY: Past Surgical History:  Procedure Laterality Date   ABDOMINAL HYSTERECTOMY     APPENDECTOMY     with hysterectomy   childbirth     x4   CHOLECYSTECTOMY  2001   EYE SURGERY Bilateral 2015   cataract surgery with lens implant   NEPHRECTOMY  1992   complex cystic mass - nephrectomy right   ORIF ANKLE FRACTURE Right 07/02/2014   Procedure: OPEN REDUCTION INTERNAL FIXATION (ORIF) RIGHT ANKLE BIMALLOELAR FRACTURE;  Surgeon: Wylene Simmer, MD;  Location: [facility];  Service: Orthopedics;  Laterality: Right;   OTHER SURGICAL HISTORY     hysterectomy, R ovary remains   SHOULDER SURGERY     bone spurs left    SOCIAL HISTORY: Social History   Tobacco Use   Smoking status: Former Smoker    Packs/day: 1.00    Years: 35.00    Pack years: 35.00    Types: Cigarettes    Quit date: 09/01/2013    Years since quitting: 5.7   Smokeless tobacco: Never Used   Tobacco comment: Former smoker with no desire to smoke again. Smoking years edited at patients request to include periods when she did not smoke.  Substance  Use Topics   Alcohol use: Yes    Alcohol/week: 1.0 standard drinks    Types: 1 Shots of liquor per week   Drug use: No    FAMILY HISTORY: Family History  Problem Relation Age of Onset   Stroke Father    Alzheimer's disease Father        early, states also parkinsons. died 9   Hypertension Father    Obesity Father    Ovarian cancer Mother        Dr. Jana Hakim    Arthritis Mother    Obesity Mother    Colon cancer Paternal Grandmother    Liver disease Daughter        On hospice age 77    ROS: Review of Systems  Constitutional: Negative for weight loss.  Cardiovascular: Negative for chest pain.  Gastrointestinal: Negative for nausea and vomiting.  Musculoskeletal:       Negative for muscle weakness    PHYSICAL EXAM: Pt in no acute distress  RECENT LABS AND TESTS: BMET    Component Value Date/Time   NA 141 01/09/2019 1126   K 4.5 01/09/2019 1126   CL 101 01/09/2019 1126   CO2 22 01/09/2019 1126   GLUCOSE 107 (H) 01/09/2019 1126   GLUCOSE 109 (H) 01/18/2017 0902   BUN 12 01/09/2019 1126   CREATININE 1.07 (H) 01/09/2019 1126   CALCIUM 9.7 01/09/2019 1126   GFRNONAA 52 (L) 01/09/2019 1126   GFRAA 60 01/09/2019 1126   Lab Results  Component Value Date   HGBA1C 5.7 (H) 01/09/2019   HGBA1C 5.8 (H) 07/25/2018   HGBA1C 5.7 (H) 09/18/2017   HGBA1C (H) 04/15/2010    5.9 (NOTE)                                                                       According to  the ADA Clinical Practice Recommendations for 2011, when HbA1c is used as a screening test:   >=6.5%   Diagnostic of Diabetes Mellitus           (if abnormal result  is confirmed)  5.7-6.4%   Increased risk of developing Diabetes Mellitus  References:Diagnosis and Classification of Diabetes Mellitus,Diabetes XBLT,9030,09(QZRAQ 1):S62-S69 and Standards of Medical Care in         Diabetes - 2011,Diabetes TMAU,6333,54  (Suppl 1):S11-S61.   HGBA1C 5.8 12/31/2007   Lab Results  Component Value Date    INSULIN 18.6 01/09/2019   INSULIN 22.0 07/25/2018   INSULIN 25.3 (H) 09/18/2017   CBC    Component Value Date/Time   WBC 6.4 09/18/2017 1049   WBC 6.6 01/18/2017 0902   RBC 4.57 09/18/2017 1049   RBC 4.39 01/18/2017 0902   HGB 13.5 09/18/2017 1049   HCT 40.4 09/18/2017 1049   PLT 240.0 01/18/2017 0902   MCV 88 09/18/2017 1049   MCH 29.5 09/18/2017 1049   MCH 29.4 07/02/2014 1143   MCHC 33.4 09/18/2017 1049   MCHC 33.2 01/18/2017 0902   RDW 15.2 09/18/2017 1049   LYMPHSABS 2.6 09/18/2017 1049   MONOABS 0.9 04/15/2010 2135   EOSABS 0.2 09/18/2017 1049   BASOSABS 0.0 09/18/2017 1049   Iron/TIBC/Ferritin/ %Sat No results found for: IRON, TIBC, FERRITIN, IRONPCTSAT Lipid Panel     Component Value Date/Time   CHOL 215 (H) 01/09/2019 1126   TRIG 210 (H) 01/09/2019 1126   HDL 61 01/09/2019 1126   CHOLHDL 2 01/18/2017 0902   VLDL 28.2 01/18/2017 0902   LDLCALC 112 (H) 01/09/2019 1126   LDLDIRECT 77.0 01/18/2017 0902   Hepatic Function Panel     Component Value Date/Time   PROT 7.9 01/09/2019 1126   ALBUMIN 4.6 01/09/2019 1126   AST 29 01/09/2019 1126   ALT 28 01/09/2019 1126   ALKPHOS 94 01/09/2019 1126   BILITOT 0.3 01/09/2019 1126   BILIDIR <0.1 04/15/2010 2135   IBILI NOT CALCULATED 04/15/2010 2135      Component Value Date/Time   TSH 3.430 09/18/2017 1049   TSH 2.09 05/25/2015 0820   TSH 1.89 12/31/2007 0831     Ref. Range 01/09/2019 11:26  Vitamin D, 25-Hydroxy Latest Ref Range: 30.0 - 100.0 ng/mL 39.4    I, Doreene Nest, am acting as Location manager for [employer], PA-C

## 2019-06-10 ENCOUNTER — Telehealth (INDEPENDENT_AMBULATORY_CARE_PROVIDER_SITE_OTHER): Payer: Medicare HMO | Admitting: Physician Assistant

## 2019-06-11 ENCOUNTER — Other Ambulatory Visit: Payer: Self-pay

## 2019-06-11 ENCOUNTER — Ambulatory Visit (INDEPENDENT_AMBULATORY_CARE_PROVIDER_SITE_OTHER): Payer: Medicare HMO | Admitting: Physician Assistant

## 2019-06-11 ENCOUNTER — Encounter (INDEPENDENT_AMBULATORY_CARE_PROVIDER_SITE_OTHER): Payer: Self-pay | Admitting: Physician Assistant

## 2019-06-11 DIAGNOSIS — Z6836 Body mass index (BMI) 36.0-36.9, adult: Secondary | ICD-10-CM

## 2019-06-11 DIAGNOSIS — E559 Vitamin D deficiency, unspecified: Secondary | ICD-10-CM | POA: Diagnosis not present

## 2019-06-11 DIAGNOSIS — E7849 Other hyperlipidemia: Secondary | ICD-10-CM | POA: Diagnosis not present

## 2019-06-11 MED ORDER — VITAMIN D (ERGOCALCIFEROL) 1.25 MG (50000 UNIT) PO CAPS: 50000.0000 [IU] | ORAL_CAPSULE | ORAL | 0 refills | Status: DC

## 2019-06-11 NOTE — Progress Notes (Signed)
Office: [phone]  /  Fax: [phone] TeleHealth Visit:  Angie Velasquez has verbally consented to this TeleHealth visit today. The patient is located at home, the provider is located at the [facility] office. The participants in this visit include the listed provider and patient and any and all parties involved. The visit was conducted today via telephone. Angie Velasquez was unable to use realtime audiovisual technology today and the telehealth visit was conducted via telephone.  HPI:   Chief Complaint: OBESITY Angie Velasquez is here to discuss her progress with her obesity treatment plan. She is on the Category 2 plan and is following her eating plan approximately 40 % of the time. She states she is doing yard work for exercise. Angie Velasquez reports her most recent weight is 243 pounds. She just returned from [place] and she has been trying to get back on track. Angie Velasquez is working to get all of her protein in. We were unable to weigh the patient today for this TeleHealth visit. She feels as if she has maintained weight since her last visit. She has lost 6 lbs since starting treatment with [name].  Vitamin D deficiency Angie Velasquez has a diagnosis of vitamin D deficiency. She is on weekly vit D and denies nausea, vomiting or muscle weakness.  Hyperlipidemia Angie Velasquez has hyperlipidemia and she is on Zocor. Angie Velasquez has been trying to improve her cholesterol levels with intensive lifestyle modification including a low saturated fat diet, exercise and weight loss. She denies any chest pain.  ASSESSMENT AND PLAN:  Vitamin D deficiency - Plan: Vitamin D, Ergocalciferol, (DRISDOL) 1.25 MG (50000 UT) CAPS capsule  Other hyperlipidemia  Class 2 severe obesity with serious comorbidity and body mass index (BMI) of 36.0 to 36.9 in adult, unspecified obesity type ([place])  PLAN:  Vitamin D Deficiency Angie Velasquez was informed that low vitamin D levels contributes to fatigue and are associated with  obesity, breast, and colon cancer. She agrees to continue to take prescription Vit D @50 ,000 IU every week #12 with no refills and will follow up for routine testing of vitamin D, at least 2-3 times per year. She was informed of the risk of over-replacement of vitamin D and agrees to not increase her dose unless she discusses this with [name] first. Angie Velasquez agrees to follow up with our clinic in 3 weeks.  Hyperlipidemia Angie Velasquez was informed of the American Heart Association Guidelines emphasizing intensive lifestyle modifications as the first line treatment for hyperlipidemia. We discussed many lifestyle modifications today in depth, and Angie Velasquez will continue to work on decreasing saturated fats such as fatty red meat, butter and many fried foods. She will also increase vegetables and lean protein in her diet and continue to work on exercise and weight loss efforts. Angie Velasquez will continue with her medications and follow up as directed.  Obesity Angie Velasquez is currently in the action stage of change. As such, her goal is to continue with weight loss efforts She has agreed to follow the Category 2 plan Angie Velasquez has been instructed to work up to a goal of 150 minutes of combined cardio and strengthening exercise per week for weight loss and overall health benefits. We discussed the following Behavioral Modification Strategies today: keeping healthy foods in the home and work on meal planning and easy cooking plans  Angie Velasquez has agreed to follow up with our clinic in 3 weeks. She was informed of the importance of frequent follow up visits to maximize her success with intensive lifestyle modifications for her multiple health  conditions.  ALLERGIES: Allergies  Allergen Reactions   Epinephrine Palpitations   Codeine Nausea Only   Penicillins Nausea Only    MEDICATIONS: Current Outpatient Medications on File Prior to Visit  Medication Sig Dispense Refill   acetaminophen (TYLENOL) 500 MG tablet Take 500 mg  by mouth every 6 (six) hours as needed.     allopurinol (ZYLOPRIM) 300 MG tablet Take 1 tablet (300 mg total) by mouth 2 (two) times daily. 180 tablet 3   AMBULATORY NON FORMULARY MEDICATION Rolling walker with a seat 1 Units 0   aspirin 81 MG tablet Take 81 mg by mouth daily.     buPROPion (WELLBUTRIN SR) 200 MG 12 hr tablet Take 1 tablet (200 mg total) by mouth daily. 30 tablet 0   CALCIUM-MAGNESIUM-ZINC PO Take by mouth.     clonazePAM (KLONOPIN) 0.5 MG tablet Take 1 tablet (0.5 mg total) by mouth at bedtime as needed. 90 tablet 1   diazepam (VALIUM) 10 MG tablet Take 10 mg by mouth every 6 (six) hours as needed (as needed for muscle spasms).     diclofenac sodium (VOLTAREN) 1 % GEL Apply topically to affected area qid 100 g 2   doxycycline (VIBRAMYCIN) 100 MG capsule Take 1 capsule (100 mg total) by mouth 2 (two) times daily. 20 capsule 0   gabapentin (NEURONTIN) 300 MG capsule Take 1 capsule, orally, up to TID or as directed 270 capsule 1   HYDROcodone-acetaminophen (NORCO) 5-325 MG tablet Take 1 tablet by mouth every 6 (six) hours as needed. 15 tablet 0   lisinopril (PRINIVIL,ZESTRIL) 2.5 MG tablet TAKE 1 TABLET EVERY DAY 90 tablet 3   meloxicam (MOBIC) 15 MG tablet TAKE 1 TABLET BY MOUTH DAILY FOR 14 DAYS, THEN AS NEEDED 90 tablet 0   nystatin cream (MYCOSTATIN) APPLY 1 APPLICATION TOPICALLY TO RASH IN GROIN TWICE DAILY FOR UP TO 2 WEEKS MAXIMUM 90 g 2   OVER THE COUNTER MEDICATION Cherry extract bid     OVER THE COUNTER MEDICATION B-12 one daily     OVER THE COUNTER MEDICATION Vitamin D     polyethylene glycol powder (GLYCOLAX/MIRALAX) powder Take 17 g by mouth daily. 3350 g 0   sertraline (ZOLOFT) 50 MG tablet Take 1 tablet (50 mg total) by mouth daily. 90 tablet 3   simvastatin (ZOCOR) 20 MG tablet TAKE 1 TABLET AT BEDTIME 90 tablet 0   Sodium Bicarbonate (NICE PURE BAKING SODA) POWD by Does not apply route.     traMADol (ULTRAM) 50 MG tablet Take 1 tablet (50 mg  total) by mouth every 6 (six) hours as needed for moderate pain or severe pain. 60 tablet 1   triamcinolone cream (KENALOG) 0.1 % Apply 1 application topically 2 (two) times daily. For 7-10 days for recurring eczema issues 80 g 1   No current facility-administered medications on file prior to visit.     PAST MEDICAL HISTORY: Past Medical History:  Diagnosis Date   Anxiety    years ago - no longer an issue   Arthritis    gout   Cataracts, bilateral    Complication of anesthesia    Fracture of pelvis (HCC)    GERD (gastroesophageal reflux disease)    Hx of chest pain    "anxiety"   Hyperlipidemia    INSOMNIA 10/14/2008   in past   Joint pain    Kidney problem    Low blood sugar    eats several smaller meals a day   Obesity  Palpitations    Stress related    Proteinuria    H/O   Tobacco abuse    quit smoking 08/2013    PAST SURGICAL HISTORY: Past Surgical History:  Procedure Laterality Date   ABDOMINAL HYSTERECTOMY     APPENDECTOMY     with hysterectomy   childbirth     x4   CHOLECYSTECTOMY  2001   EYE SURGERY Bilateral 2015   cataract surgery with lens implant   NEPHRECTOMY  1992   complex cystic mass - nephrectomy right   ORIF ANKLE FRACTURE Right 07/02/2014   Procedure: OPEN REDUCTION INTERNAL FIXATION (ORIF) RIGHT ANKLE BIMALLOELAR FRACTURE;  Surgeon: Wylene Simmer, MD;  Location: [facility];  Service: Orthopedics;  Laterality: Right;   OTHER SURGICAL HISTORY     hysterectomy, R ovary remains   SHOULDER SURGERY     bone spurs left    SOCIAL HISTORY: Social History   Tobacco Use   Smoking status: Former Smoker    Packs/day: 1.00    Years: 35.00    Pack years: 35.00    Types: Cigarettes    Quit date: 09/01/2013    Years since quitting: 5.7   Smokeless tobacco: Never Used   Tobacco comment: Former smoker with no desire to smoke again. Smoking years edited at patients request to include periods when she did not smoke.  Substance  Use Topics   Alcohol use: Yes    Alcohol/week: 1.0 standard drinks    Types: 1 Shots of liquor per week   Drug use: No    FAMILY HISTORY: Family History  Problem Relation Age of Onset   Stroke Father    Alzheimer's disease Father        early, states also parkinsons. died 39   Hypertension Father    Obesity Father    Ovarian cancer Mother        Dr. Jana Hakim    Arthritis Mother    Obesity Mother    Colon cancer Paternal Grandmother    Liver disease Daughter        On hospice age 51    ROS: Review of Systems  Constitutional: Negative for weight loss.  Cardiovascular: Negative for chest pain.  Gastrointestinal: Negative for nausea and vomiting.  Musculoskeletal:       Negative for muscle weakness    PHYSICAL EXAM: Pt in no acute distress  RECENT LABS AND TESTS: BMET    Component Value Date/Time   NA 141 01/09/2019 1126   K 4.5 01/09/2019 1126   CL 101 01/09/2019 1126   CO2 22 01/09/2019 1126   GLUCOSE 107 (H) 01/09/2019 1126   GLUCOSE 109 (H) 01/18/2017 0902   BUN 12 01/09/2019 1126   CREATININE 1.07 (H) 01/09/2019 1126   CALCIUM 9.7 01/09/2019 1126   GFRNONAA 52 (L) 01/09/2019 1126   GFRAA 60 01/09/2019 1126   Lab Results  Component Value Date   HGBA1C 5.7 (H) 01/09/2019   HGBA1C 5.8 (H) 07/25/2018   HGBA1C 5.7 (H) 09/18/2017   HGBA1C (H) 04/15/2010    5.9 (NOTE)                                                                       According to the [place]  Recommendations for 2011, when HbA1c is used as a screening test:   >=6.5%   Diagnostic of Diabetes Mellitus           (if abnormal result  is confirmed)  5.7-6.4%   Increased risk of developing Diabetes Mellitus  References:Diagnosis and Classification of Diabetes Mellitus,Diabetes RAQT,6226,33(HLKTG 1):S62-S69 and Standards of Medical Care in         Diabetes - 2011,Diabetes YBWL,8937,34  (Suppl 1):S11-S61.   HGBA1C 5.8 12/31/2007   Lab Results  Component Value Date    INSULIN 18.6 01/09/2019   INSULIN 22.0 07/25/2018   INSULIN 25.3 (H) 09/18/2017   CBC    Component Value Date/Time   WBC 6.4 09/18/2017 1049   WBC 6.6 01/18/2017 0902   RBC 4.57 09/18/2017 1049   RBC 4.39 01/18/2017 0902   HGB 13.5 09/18/2017 1049   HCT 40.4 09/18/2017 1049   PLT 240.0 01/18/2017 0902   MCV 88 09/18/2017 1049   MCH 29.5 09/18/2017 1049   MCH 29.4 07/02/2014 1143   MCHC 33.4 09/18/2017 1049   MCHC 33.2 01/18/2017 0902   RDW 15.2 09/18/2017 1049   LYMPHSABS 2.6 09/18/2017 1049   MONOABS 0.9 04/15/2010 2135   EOSABS 0.2 09/18/2017 1049   BASOSABS 0.0 09/18/2017 1049   Iron/TIBC/Ferritin/ %Sat No results found for: IRON, TIBC, FERRITIN, IRONPCTSAT Lipid Panel     Component Value Date/Time   CHOL 215 (H) 01/09/2019 1126   TRIG 210 (H) 01/09/2019 1126   HDL 61 01/09/2019 1126   CHOLHDL 2 01/18/2017 0902   VLDL 28.2 01/18/2017 0902   LDLCALC 112 (H) 01/09/2019 1126   LDLDIRECT 77.0 01/18/2017 0902   Hepatic Function Panel     Component Value Date/Time   PROT 7.9 01/09/2019 1126   ALBUMIN 4.6 01/09/2019 1126   AST 29 01/09/2019 1126   ALT 28 01/09/2019 1126   ALKPHOS 94 01/09/2019 1126   BILITOT 0.3 01/09/2019 1126   BILIDIR <0.1 04/15/2010 2135   IBILI NOT CALCULATED 04/15/2010 2135      Component Value Date/Time   TSH 3.430 09/18/2017 1049   TSH 2.09 05/25/2015 0820   TSH 1.89 12/31/2007 0831     Ref. Range 01/09/2019 11:26  Vitamin D, 25-Hydroxy Latest Ref Range: 30.0 - 100.0 ng/mL 39.4    I, Doreene Nest, am acting as Location manager for [employer], PA-C I, Abby Potash, PA-C have reviewed above note and agree with its content

## 2019-06-23 ENCOUNTER — Encounter: Payer: Self-pay | Admitting: Family Medicine

## 2019-07-01 ENCOUNTER — Encounter: Payer: Self-pay | Admitting: Family Medicine

## 2019-07-03 ENCOUNTER — Encounter: Payer: Self-pay | Admitting: Physician Assistant

## 2019-07-03 ENCOUNTER — Other Ambulatory Visit: Payer: Self-pay

## 2019-07-03 ENCOUNTER — Ambulatory Visit: Payer: Medicare HMO | Admitting: Physician Assistant

## 2019-07-03 DIAGNOSIS — R6889 Other general symptoms and signs: Secondary | ICD-10-CM | POA: Diagnosis not present

## 2019-07-03 DIAGNOSIS — Z1159 Encounter for screening for other viral diseases: Secondary | ICD-10-CM

## 2019-07-03 DIAGNOSIS — Z20822 Contact with and (suspected) exposure to covid-19: Secondary | ICD-10-CM

## 2019-07-03 DIAGNOSIS — Z7189 Other specified counseling: Secondary | ICD-10-CM

## 2019-07-03 NOTE — Progress Notes (Signed)
Patient is requesting evaluation for COVID-19 testing prior to entering [place] of [place].   Symptom onset: None  Travel or Contacts: No travel and no contact that she is aware of.  Patient endorses the following symptoms: Fever >100.21F []   Yes [x]   No []   Unknown Subjective fever (felt feverish) []   Yes [x]   No []   Unknown Chills []   Yes [x]   No []   Unknown Muscle aches (myalgia) []   Yes [x]   No []   Unknown Runny nose (rhinorrhea) []   Yes [x]   No []   Unknown Sore throat []   Yes [x]   No []   Unknown Cough (new onset or worsening of chronic cough) []   Yes [x]   No []   Unknown Shortness of breath (dyspnea) []   Yes [x]   No []   Unknown Nausea or vomiting []   Yes [x]   No []   Unknown Headache []   Yes [x]   No []   Unknown Abdominal pain  []   Yes [x]   No []   Unknown Diarrhea (?3 loose/looser than normal stools/24hr period) []   Yes [x]   No []   Unknown Other, specify:  Treatments tried: N/A  Patient risk factors: Smoker? []   Current [x]   Former []   Never If female, currently pregnant? []   Yes [x]   No   Plan:  Jeffrey was seen today for covid-19 testing.  Diagnoses and all orders for this visit:  Encounter for screening for other viral diseases -     Novel Coronavirus, NAA ([facility])  Advice Given About Covid-19 Virus Infection     Asymptomatic.  We are going to send patient for drive-up testing. As a precaution, they have been advised to remain home until COVID-19 results and then possible further quarantine after that based on results and symptoms. Advised if they experience a "second sickening" or worsening symptoms as the illness progresses, they are to call the office for further instructions or seek emergent evaluation for any severe symptoms.   . Reviewed expectations re: course of current medical issues. . Discussed self-management of symptoms. . Outlined signs and symptoms indicating need for more acute intervention. . Patient verbalized understanding and all questions were  answered. [name] Health Maintenance issues including appropriate healthy diet, exercise, and smoking avoidance were discussed with patient. . See orders for this visit as documented in the electronic medical record.  I discussed the assessment and treatment plan with the patient. The patient was provided an opportunity to ask questions and all were answered. The patient agreed with the plan and demonstrated an understanding of the instructions.   The patient was advised to call back or seek an in-person evaluation if the symptoms worsen or if the condition fails to improve as anticipated.   Inda Coke, [place] 07/03/2019

## 2019-07-05 LAB — NOVEL CORONAVIRUS, NAA: SARS-CoV-2, NAA: NOT DETECTED

## 2019-07-07 ENCOUNTER — Encounter (INDEPENDENT_AMBULATORY_CARE_PROVIDER_SITE_OTHER): Payer: Self-pay | Admitting: Physician Assistant

## 2019-07-07 ENCOUNTER — Other Ambulatory Visit: Payer: Self-pay

## 2019-07-07 ENCOUNTER — Telehealth (INDEPENDENT_AMBULATORY_CARE_PROVIDER_SITE_OTHER): Payer: Medicare HMO | Admitting: Physician Assistant

## 2019-07-07 DIAGNOSIS — Z6836 Body mass index (BMI) 36.0-36.9, adult: Secondary | ICD-10-CM | POA: Diagnosis not present

## 2019-07-07 DIAGNOSIS — E559 Vitamin D deficiency, unspecified: Secondary | ICD-10-CM

## 2019-07-08 NOTE — Progress Notes (Signed)
Office: [phone]  /  Fax: [phone] TeleHealth Visit:  Angie Velasquez has verbally consented to this TeleHealth visit today. The patient is located in [place], the provider is located at the [facility] office. The participants in this visit include the listed provider and patient. The visit was conducted today via Webex.  HPI:   Chief Complaint: OBESITY Angie Velasquez is here to discuss her progress with her obesity treatment plan. She is on the Category 2 plan and is following her eating plan approximately 50% of the time. She states she is exercising 0 minutes 0 times per week. Angie Velasquez reports her most recent weight to be 243 lbs. She reports that she is trying to get in more protein. She is currently in [place] with her family who is supportive. We were unable to weigh the patient today for this TeleHealth visit. She feels as if she has maintained her weight since her last visit. She has lost 5 lbs since starting treatment with [name].  Vitamin D deficiency Angie Velasquez has a diagnosis of Vitamin D deficiency. She is currently taking weekly Vit D and denies nausea, vomiting or muscle weakness.  ASSESSMENT AND PLAN:  Vitamin D deficiency  Class 2 severe obesity with serious comorbidity and body mass index (BMI) of 36.0 to 36.9 in adult, unspecified obesity type ([name])  PLAN:  Vitamin D Deficiency Angie Velasquez was informed that low Vitamin D levels contributes to fatigue and are associated with obesity, breast, and colon cancer. She agrees to continue taking weekly Vit D and will follow-up for routine testing of Vitamin D, at least 2-3 times per year. She was informed of the risk of over-replacement of Vitamin D and agrees to not increase her dose unless she discusses this with [name] first. Angie Velasquez agrees to follow-up with our clinic in 2 weeks.  Obesity Angie Velasquez is currently in the action stage of change. As such, her goal is to continue with weight loss efforts. She  has agreed to follow the Category 2 plan. Angie Velasquez has been instructed to work up to a goal of 150 minutes of combined cardio and strengthening exercise per week for weight loss and overall health benefits. We discussed the following Behavioral Modification Stratagies today: work on meal planning and easy cooking plans, and keeping healthy foods in the home.  Angie Velasquez has agreed to follow-up with our clinic in 2 weeks. She was informed of the importance of frequent follow-up visits to maximize her success with intensive lifestyle modifications for her multiple health conditions.  ALLERGIES: Allergies  Allergen Reactions   Epinephrine Palpitations   Codeine Nausea Only   Penicillins Nausea Only    MEDICATIONS: Current Outpatient Medications on File Prior to Visit  Medication Sig Dispense Refill   acetaminophen (TYLENOL) 500 MG tablet Take 500 mg by mouth every 6 (six) hours as needed.     allopurinol (ZYLOPRIM) 300 MG tablet Take 1 tablet (300 mg total) by mouth 2 (two) times daily. 180 tablet 3   AMBULATORY NON FORMULARY MEDICATION Rolling walker with a seat 1 Units 0   aspirin 81 MG tablet Take 81 mg by mouth daily.     CALCIUM-MAGNESIUM-ZINC PO Take by mouth.     clonazePAM (KLONOPIN) 0.5 MG tablet Take 1 tablet (0.5 mg total) by mouth at bedtime as needed. 90 tablet 1   diazepam (VALIUM) 10 MG tablet Take 10 mg by mouth every 6 (six) hours as needed (as needed for muscle spasms).     diclofenac sodium (VOLTAREN) 1 %  GEL Apply topically to affected area qid 100 g 2   gabapentin (NEURONTIN) 300 MG capsule Take 1 capsule, orally, up to TID or as directed 270 capsule 1   HYDROcodone-acetaminophen (NORCO) 5-325 MG tablet Take 1 tablet by mouth every 6 (six) hours as needed. 15 tablet 0   lisinopril (PRINIVIL,ZESTRIL) 2.5 MG tablet TAKE 1 TABLET EVERY DAY 90 tablet 3   meloxicam (MOBIC) 15 MG tablet TAKE 1 TABLET BY MOUTH DAILY FOR 14 DAYS, THEN AS NEEDED 90 tablet 0    nystatin cream (MYCOSTATIN) APPLY 1 APPLICATION TOPICALLY TO RASH IN GROIN TWICE DAILY FOR UP TO 2 WEEKS MAXIMUM 90 g 2   OVER THE COUNTER MEDICATION Cherry extract bid     OVER THE COUNTER MEDICATION B-12 one daily     OVER THE COUNTER MEDICATION Vitamin D     polyethylene glycol powder (GLYCOLAX/MIRALAX) powder Take 17 g by mouth daily. 3350 g 0   sertraline (ZOLOFT) 50 MG tablet Take 1 tablet (50 mg total) by mouth daily. 90 tablet 3   simvastatin (ZOCOR) 20 MG tablet TAKE 1 TABLET AT BEDTIME 90 tablet 0   Sodium Bicarbonate (NICE PURE BAKING SODA) POWD by Does not apply route.     traMADol (ULTRAM) 50 MG tablet Take 1 tablet (50 mg total) by mouth every 6 (six) hours as needed for moderate pain or severe pain. 60 tablet 1   triamcinolone cream (KENALOG) 0.1 % Apply 1 application topically 2 (two) times daily. For 7-10 days for recurring eczema issues 80 g 1   Vitamin D, Ergocalciferol, (DRISDOL) 1.25 MG (50000 UT) CAPS capsule Take 1 capsule (50,000 Units total) by mouth every 7 (seven) days. 12 capsule 0   No current facility-administered medications on file prior to visit.     PAST MEDICAL HISTORY: Past Medical History:  Diagnosis Date   Anxiety    years ago - no longer an issue   Arthritis    gout   Cataracts, bilateral    Complication of anesthesia    Fracture of pelvis (HCC)    GERD (gastroesophageal reflux disease)    Hx of chest pain    "anxiety"   Hyperlipidemia    INSOMNIA 10/14/2008   in past   Joint pain    Kidney problem    Low blood sugar    eats several smaller meals a day   Obesity    Palpitations    Stress related    Proteinuria    H/O   Tobacco abuse    quit smoking 08/2013    PAST SURGICAL HISTORY: Past Surgical History:  Procedure Laterality Date   ABDOMINAL HYSTERECTOMY     APPENDECTOMY     with hysterectomy   childbirth     x4   CHOLECYSTECTOMY  2001   EYE SURGERY Bilateral 2015   cataract surgery with lens  implant   NEPHRECTOMY  1992   complex cystic mass - nephrectomy right   ORIF ANKLE FRACTURE Right 07/02/2014   Procedure: OPEN REDUCTION INTERNAL FIXATION (ORIF) RIGHT ANKLE BIMALLOELAR FRACTURE;  Surgeon: Wylene Simmer, MD;  Location: [facility];  Service: Orthopedics;  Laterality: Right;   OTHER SURGICAL HISTORY     hysterectomy, R ovary remains   SHOULDER SURGERY     bone spurs left    SOCIAL HISTORY: Social History   Tobacco Use   Smoking status: Former Smoker    Packs/day: 1.00    Years: 35.00    Pack years: 35.00  Types: Cigarettes    Quit date: 09/01/2013    Years since quitting: 5.8   Smokeless tobacco: Never Used   Tobacco comment: Former smoker with no desire to smoke again. Smoking years edited at patients request to include periods when she did not smoke.  Substance Use Topics   Alcohol use: Yes    Alcohol/week: 1.0 standard drinks    Types: 1 Shots of liquor per week   Drug use: No    FAMILY HISTORY: Family History  Problem Relation Age of Onset   Stroke Father    Alzheimer's disease Father        early, states also parkinsons. died 8   Hypertension Father    Obesity Father    Ovarian cancer Mother        Dr. Jana Hakim    Arthritis Mother    Obesity Mother    Colon cancer Paternal Grandmother    Liver disease Daughter        On hospice age 76   ROS: Review of Systems  Gastrointestinal: Negative for nausea and vomiting.  Musculoskeletal:       Negative for muscle weakness.   PHYSICAL EXAM: Pt in no acute distress  RECENT LABS AND TESTS: BMET    Component Value Date/Time   NA 141 01/09/2019 1126   K 4.5 01/09/2019 1126   CL 101 01/09/2019 1126   CO2 22 01/09/2019 1126   GLUCOSE 107 (H) 01/09/2019 1126   GLUCOSE 109 (H) 01/18/2017 0902   BUN 12 01/09/2019 1126   CREATININE 1.07 (H) 01/09/2019 1126   CALCIUM 9.7 01/09/2019 1126   GFRNONAA 52 (L) 01/09/2019 1126   GFRAA 60 01/09/2019 1126   Lab Results  Component Value Date     HGBA1C 5.7 (H) 01/09/2019   HGBA1C 5.8 (H) 07/25/2018   HGBA1C 5.7 (H) 09/18/2017   HGBA1C (H) 04/15/2010    5.9 (NOTE)                                                                       According to the ADA Clinical Practice Recommendations for 2011, when HbA1c is used as a screening test:   >=6.5%   Diagnostic of Diabetes Mellitus           (if abnormal result  is confirmed)  5.7-6.4%   Increased risk of developing Diabetes Mellitus  References:Diagnosis and Classification of Diabetes Mellitus,Diabetes LZJQ,7341,93(XTKWI 1):S62-S69 and Standards of Medical Care in         Diabetes - 2011,Diabetes OXBD,5329,92  (Suppl 1):S11-S61.   HGBA1C 5.8 12/31/2007   Lab Results  Component Value Date   INSULIN 18.6 01/09/2019   INSULIN 22.0 07/25/2018   INSULIN 25.3 (H) 09/18/2017   CBC    Component Value Date/Time   WBC 6.4 09/18/2017 1049   WBC 6.6 01/18/2017 0902   RBC 4.57 09/18/2017 1049   RBC 4.39 01/18/2017 0902   HGB 13.5 09/18/2017 1049   HCT 40.4 09/18/2017 1049   PLT 240.0 01/18/2017 0902   MCV 88 09/18/2017 1049   MCH 29.5 09/18/2017 1049   MCH 29.4 07/02/2014 1143   MCHC 33.4 09/18/2017 1049   MCHC 33.2 01/18/2017 0902   RDW 15.2 09/18/2017 1049   LYMPHSABS 2.6 09/18/2017 1049  MONOABS 0.9 04/15/2010 2135   EOSABS 0.2 09/18/2017 1049   BASOSABS 0.0 09/18/2017 1049   Iron/TIBC/Ferritin/ %Sat No results found for: IRON, TIBC, FERRITIN, IRONPCTSAT Lipid Panel     Component Value Date/Time   CHOL 215 (H) 01/09/2019 1126   TRIG 210 (H) 01/09/2019 1126   HDL 61 01/09/2019 1126   CHOLHDL 2 01/18/2017 0902   VLDL 28.2 01/18/2017 0902   LDLCALC 112 (H) 01/09/2019 1126   LDLDIRECT 77.0 01/18/2017 0902   Hepatic Function Panel     Component Value Date/Time   PROT 7.9 01/09/2019 1126   ALBUMIN 4.6 01/09/2019 1126   AST 29 01/09/2019 1126   ALT 28 01/09/2019 1126   ALKPHOS 94 01/09/2019 1126   BILITOT 0.3 01/09/2019 1126   BILIDIR <0.1 04/15/2010 2135   IBILI  NOT CALCULATED 04/15/2010 2135      Component Value Date/Time   TSH 3.430 09/18/2017 1049   TSH 2.09 05/25/2015 0820   TSH 1.89 12/31/2007 0831   Results for Judie Bonus ELIZABETH "[name]" (MRN [id number]) as of 07/08/2019 08:18  Ref. Range 01/09/2019 11:26  Vitamin D, 25-Hydroxy Latest Ref Range: 30.0 - 100.0 ng/mL 39.4    I, Michaelene Song, am acting as Location manager for [employer], PA-C I, Abby Potash, PA-C have reviewed above note and agree with its content

## 2019-07-21 ENCOUNTER — Telehealth (INDEPENDENT_AMBULATORY_CARE_PROVIDER_SITE_OTHER): Payer: Medicare HMO | Admitting: Physician Assistant

## 2019-07-21 ENCOUNTER — Other Ambulatory Visit: Payer: Self-pay

## 2019-07-21 DIAGNOSIS — Z6836 Body mass index (BMI) 36.0-36.9, adult: Secondary | ICD-10-CM

## 2019-07-21 DIAGNOSIS — E559 Vitamin D deficiency, unspecified: Secondary | ICD-10-CM

## 2019-07-23 NOTE — Progress Notes (Signed)
Office: [phone]  /  Fax: [phone] TeleHealth Visit:  Angie Velasquez has verbally consented to this TeleHealth visit today. The patient is located at home, the provider is located at the [facility] office. The participants in this visit include the listed provider and patient. The visit was conducted today via webex.  HPI:   Chief Complaint: OBESITY Angie Velasquez is here to discuss her progress with her obesity treatment plan. She is on the Category 2 plan and is following her eating plan approximately 50 % of the time. She states she is exercising 0 minutes 0 times per week. Angie Velasquez's most recent weight is 243 lbs. She reports that she is drinking smoothies for lunch with protein powder, and is trying to eat on the plan for breakfast and dinner.  We were unable to weigh the patient today for this TeleHealth visit. She feels as if she has maintained her weight since her last visit. She has lost 5 lbs since starting treatment with [name].  Vitamin D Deficiency Angie Velasquez has a diagnosis of vitamin D deficiency. She is currently taking prescription Vit D and denies nausea, vomiting or muscle weakness.  ASSESSMENT AND PLAN:  Vitamin D deficiency  Class 2 severe obesity with serious comorbidity and body mass index (BMI) of 36.0 to 36.9 in adult, unspecified obesity type (Angie Velasquez)  PLAN:  Vitamin D Deficiency Angie Velasquez was informed that low vitamin D levels contributes to fatigue and are associated with obesity, breast, and colon cancer. Angie Velasquez agrees to continue taking prescription Vit D 50,000 IU every week and will follow up for routine testing of vitamin D, at least 2-3 times per year. She was informed of the risk of over-replacement of vitamin D and agrees to not increase her dose unless she discusses this with [name] first. Angie Velasquez agrees to follow up with our clinic in 2 weeks.  Obesity Angie Velasquez is currently in the action stage of change. As such, her goal is to continue  with weight loss efforts She has agreed to follow the Category 2 plan  Angie Velasquez is to eat more protein at lunch. Angie Velasquez has been instructed to work up to a goal of 150 minutes of combined cardio and strengthening exercise per week for weight loss and overall health benefits. We discussed the following Behavioral Modification Strategies today: increasing lean protein intake and work on meal planning and easy cooking plans   Angie Velasquez has agreed to follow up with our clinic in 2 weeks. She was informed of the importance of frequent follow up visits to maximize her success with intensive lifestyle modifications for her multiple health conditions.  ALLERGIES: Allergies  Allergen Reactions  . Epinephrine Palpitations  . Codeine Nausea Only  . Penicillins Nausea Only    MEDICATIONS: Current Outpatient Medications on File Prior to Visit  Medication Sig Dispense Refill  . acetaminophen (TYLENOL) 500 MG tablet Take 500 mg by mouth every 6 (six) hours as needed.    [name] allopurinol (ZYLOPRIM) 300 MG tablet Take 1 tablet (300 mg total) by mouth 2 (two) times daily. 180 tablet 3  . AMBULATORY NON FORMULARY MEDICATION Rolling walker with a seat 1 Units 0  . aspirin 81 MG tablet Take 81 mg by mouth daily.    [name] CALCIUM-MAGNESIUM-ZINC PO Take by mouth.    . clonazePAM (KLONOPIN) 0.5 MG tablet Take 1 tablet (0.5 mg total) by mouth at bedtime as needed. 90 tablet 1  . diazepam (VALIUM) 10 MG tablet Take 10 mg by mouth every 6 (  six) hours as needed (as needed for muscle spasms).    . diclofenac sodium (VOLTAREN) 1 % GEL Apply topically to affected area qid 100 g 2  . gabapentin (NEURONTIN) 300 MG capsule Take 1 capsule, orally, up to TID or as directed 270 capsule 1  . HYDROcodone-acetaminophen (NORCO) 5-325 MG tablet Take 1 tablet by mouth every 6 (six) hours as needed. 15 tablet 0  . lisinopril (PRINIVIL,ZESTRIL) 2.5 MG tablet TAKE 1 TABLET EVERY DAY 90 tablet 3  . meloxicam (MOBIC) 15 MG tablet TAKE 1  TABLET BY MOUTH DAILY FOR 14 DAYS, THEN AS NEEDED 90 tablet 0  . nystatin cream (MYCOSTATIN) APPLY 1 APPLICATION TOPICALLY TO RASH IN GROIN TWICE DAILY FOR UP TO 2 WEEKS MAXIMUM 90 g 2  . OVER THE COUNTER MEDICATION Cherry extract bid    . OVER THE COUNTER MEDICATION B-12 one daily    . OVER THE COUNTER MEDICATION Vitamin D    . polyethylene glycol powder (GLYCOLAX/MIRALAX) powder Take 17 g by mouth daily. 3350 g 0  . sertraline (ZOLOFT) 50 MG tablet Take 1 tablet (50 mg total) by mouth daily. 90 tablet 3  . simvastatin (ZOCOR) 20 MG tablet TAKE 1 TABLET AT BEDTIME 90 tablet 0  . Sodium Bicarbonate (NICE PURE BAKING SODA) POWD by Does not apply route.    . traMADol (ULTRAM) 50 MG tablet Take 1 tablet (50 mg total) by mouth every 6 (six) hours as needed for moderate pain or severe pain. 60 tablet 1  . triamcinolone cream (KENALOG) 0.1 % Apply 1 application topically 2 (two) times daily. For 7-10 days for recurring eczema issues 80 g 1  . Vitamin D, Ergocalciferol, (DRISDOL) 1.25 MG (50000 UT) CAPS capsule Take 1 capsule (50,000 Units total) by mouth every 7 (seven) days. 12 capsule 0   No current facility-administered medications on file prior to visit.     PAST MEDICAL HISTORY: Past Medical History:  Diagnosis Date  . Anxiety    years ago - no longer an issue  . Arthritis    gout  . Cataracts, bilateral   . Complication of anesthesia   . Fracture of pelvis ([place])   . GERD (gastroesophageal reflux disease)   . Hx of chest pain    "anxiety"  . Hyperlipidemia   . INSOMNIA 10/14/2008   in past  . Joint pain   . Kidney problem   . Low blood sugar    eats several smaller meals a day  . Obesity   . Palpitations    Stress related   . Proteinuria    H/O  . Tobacco abuse    quit smoking 08/2013    PAST SURGICAL HISTORY: Past Surgical History:  Procedure Laterality Date  . ABDOMINAL HYSTERECTOMY    . APPENDECTOMY     with hysterectomy  . childbirth     x4  . CHOLECYSTECTOMY   2001  . EYE SURGERY Bilateral 2015   cataract surgery with lens implant  . NEPHRECTOMY  1992   complex cystic mass - nephrectomy right  . ORIF ANKLE FRACTURE Right 07/02/2014   Procedure: OPEN REDUCTION INTERNAL FIXATION (ORIF) RIGHT ANKLE BIMALLOELAR FRACTURE;  Surgeon: Wylene Simmer, MD;  Location: [facility];  Service: Orthopedics;  Laterality: Right;  . OTHER SURGICAL HISTORY     hysterectomy, R ovary remains  . SHOULDER SURGERY     bone spurs left    SOCIAL HISTORY: Social History   Tobacco Use  . Smoking status: Former Smoker  Packs/day: 1.00    Years: 35.00    Pack years: 35.00    Types: Cigarettes    Quit date: 09/01/2013    Years since quitting: 5.8  . Smokeless tobacco: Never Used  . Tobacco comment: Former smoker with no desire to smoke again. Smoking years edited at patients request to include periods when she did not smoke.  Substance Use Topics  . Alcohol use: Yes    Alcohol/week: 1.0 standard drinks    Types: 1 Shots of liquor per week  . Drug use: No    FAMILY HISTORY: Family History  Problem Relation Age of Onset  . Stroke Father   . Alzheimer's disease Father        early, states also parkinsons. died 79  . Hypertension Father   . Obesity Father   . Ovarian cancer Mother        Dr. Jana Hakim   . Arthritis Mother   . Obesity Mother   . Colon cancer Paternal Grandmother   . Liver disease Daughter        On hospice age 92    ROS: Review of Systems  Constitutional: Negative for weight loss.  Gastrointestinal: Negative for nausea and vomiting.  Musculoskeletal:       Negative muscle weakness    PHYSICAL EXAM: Pt in no acute distress  RECENT LABS AND TESTS: BMET    Component Value Date/Time   NA 141 01/09/2019 1126   K 4.5 01/09/2019 1126   CL 101 01/09/2019 1126   CO2 22 01/09/2019 1126   GLUCOSE 107 (H) 01/09/2019 1126   GLUCOSE 109 (H) 01/18/2017 0902   BUN 12 01/09/2019 1126   CREATININE 1.07 (H) 01/09/2019 1126   CALCIUM 9.7 01/09/2019  1126   GFRNONAA 52 (L) 01/09/2019 1126   GFRAA 60 01/09/2019 1126   Lab Results  Component Value Date   HGBA1C 5.7 (H) 01/09/2019   HGBA1C 5.8 (H) 07/25/2018   HGBA1C 5.7 (H) 09/18/2017   HGBA1C (H) 04/15/2010    5.9 (NOTE)                                                                       According to the ADA Clinical Practice Recommendations for 2011, when HbA1c is used as a screening test:   >=6.5%   Diagnostic of Diabetes Mellitus           (if abnormal result  is confirmed)  5.7-6.4%   Increased risk of developing Diabetes Mellitus  References:Diagnosis and Classification of Diabetes Mellitus,Diabetes BHAL,9379,02(IOXBD 1):S62-S69 and Standards of Medical Care in         Diabetes - 2011,Diabetes ZHGD,9242,68  (Suppl 1):S11-S61.   HGBA1C 5.8 12/31/2007   Lab Results  Component Value Date   INSULIN 18.6 01/09/2019   INSULIN 22.0 07/25/2018   INSULIN 25.3 (H) 09/18/2017   CBC    Component Value Date/Time   WBC 6.4 09/18/2017 1049   WBC 6.6 01/18/2017 0902   RBC 4.57 09/18/2017 1049   RBC 4.39 01/18/2017 0902   HGB 13.5 09/18/2017 1049   HCT 40.4 09/18/2017 1049   PLT 240.0 01/18/2017 0902   MCV 88 09/18/2017 1049   MCH 29.5 09/18/2017 1049   MCH 29.4 07/02/2014 1143  MCHC 33.4 09/18/2017 1049   MCHC 33.2 01/18/2017 0902   RDW 15.2 09/18/2017 1049   LYMPHSABS 2.6 09/18/2017 1049   MONOABS 0.9 04/15/2010 2135   EOSABS 0.2 09/18/2017 1049   BASOSABS 0.0 09/18/2017 1049   Iron/TIBC/Ferritin/ %Sat No results found for: IRON, TIBC, FERRITIN, IRONPCTSAT Lipid Panel     Component Value Date/Time   CHOL 215 (H) 01/09/2019 1126   TRIG 210 (H) 01/09/2019 1126   HDL 61 01/09/2019 1126   CHOLHDL 2 01/18/2017 0902   VLDL 28.2 01/18/2017 0902   LDLCALC 112 (H) 01/09/2019 1126   LDLDIRECT 77.0 01/18/2017 0902   Hepatic Function Panel     Component Value Date/Time   PROT 7.9 01/09/2019 1126   ALBUMIN 4.6 01/09/2019 1126   AST 29 01/09/2019 1126   ALT 28 01/09/2019  1126   ALKPHOS 94 01/09/2019 1126   BILITOT 0.3 01/09/2019 1126   BILIDIR <0.1 04/15/2010 2135   IBILI NOT CALCULATED 04/15/2010 2135      Component Value Date/Time   TSH 3.430 09/18/2017 1049   TSH 2.09 05/25/2015 0820   TSH 1.89 12/31/2007 0831      I, Trixie Dredge, am acting as transcriptionist for Abby Potash, PA-C I, Abby Potash, PA-C have reviewed above note and agree with its content

## 2019-07-28 ENCOUNTER — Other Ambulatory Visit: Payer: Self-pay | Admitting: Family Medicine

## 2019-08-04 ENCOUNTER — Telehealth (INDEPENDENT_AMBULATORY_CARE_PROVIDER_SITE_OTHER): Payer: Medicare HMO | Admitting: Physician Assistant

## 2019-08-12 ENCOUNTER — Other Ambulatory Visit: Payer: Self-pay

## 2019-08-12 ENCOUNTER — Telehealth (INDEPENDENT_AMBULATORY_CARE_PROVIDER_SITE_OTHER): Payer: Medicare HMO | Admitting: Physician Assistant

## 2019-08-12 ENCOUNTER — Encounter (INDEPENDENT_AMBULATORY_CARE_PROVIDER_SITE_OTHER): Payer: Self-pay | Admitting: Physician Assistant

## 2019-08-12 DIAGNOSIS — E7849 Other hyperlipidemia: Secondary | ICD-10-CM | POA: Diagnosis not present

## 2019-08-12 DIAGNOSIS — Z6836 Body mass index (BMI) 36.0-36.9, adult: Secondary | ICD-10-CM

## 2019-08-13 ENCOUNTER — Ambulatory Visit (INDEPENDENT_AMBULATORY_CARE_PROVIDER_SITE_OTHER): Payer: Medicare HMO | Admitting: Physician Assistant

## 2019-08-13 ENCOUNTER — Encounter: Payer: Self-pay | Admitting: Physician Assistant

## 2019-08-13 VITALS — Ht 68.0 in | Wt 243.0 lb

## 2019-08-13 DIAGNOSIS — J029 Acute pharyngitis, unspecified: Secondary | ICD-10-CM | POA: Diagnosis not present

## 2019-08-13 MED ORDER — AZITHROMYCIN 250 MG PO TABS: ORAL_TABLET | ORAL | 0 refills | Status: DC

## 2019-08-13 NOTE — Progress Notes (Addendum)
Virtual Visit via Video   I connected with Angie Velasquez on 08/13/19 at  4:00 PM EDT by a video enabled telemedicine application and verified that I am speaking with the correct person using two identifiers. Location patient: Home Location provider: [facility], Office Persons participating in the virtual visit: Jillane, [place] PA-C.  I discussed the limitations of evaluation and management by telemedicine and the availability of in person appointments. The patient expressed understanding and agreed to proceed.  I acted as a Education administrator for [employer], PA-C [employer], LPN  Subjective:   HPI:   Sore throat Pt c/o sore throat x 4-5 days, also post nasal drip at night. Pt was tested for COVID in July was negative. Denies fever, cough, chills.  Last night she had left ear pain that was radiating from her throat.  Pain was so bad she had to take a tramadol.  Pain is much better today.  She denies any COVID contacts.  Denies: stridor, difficulty controlling secretions, severe pain on one side  ROS: See pertinent positives and negatives per HPI.  Patient Active Problem List   Diagnosis Date Noted  . CKD (chronic kidney disease), stage III ([place]) 02/14/2019  . Osteitis condensans 06/25/2018  . Osteoarthritis of spine with radiculopathy, cervical region 02/12/2018  . Intertrigo 01/10/2018  . Pes anserinus bursitis of left knee 01/02/2018  . Osteitis pubis ([place]) 12/19/2017  . Prediabetes 10/22/2017  . Other fatigue 09/18/2017  . Shortness of breath on exertion 09/18/2017  . Vitamin D deficiency 09/18/2017  . Hyperglycemia 09/18/2017  . Class 2 severe obesity with serious comorbidity and body mass index (BMI) of 35.0 to 35.9 in adult ([place]) 06/13/2017  . Atypical chest pain 03/26/2017  . Depression 12/19/2016  . Anxiety state 07/20/2016  . Essential hypertension 01/04/2016  . Eczema 06/29/2015  . Gout 03/12/2015  . S/p nephrectomy 03/12/2015   . Hyperlipidemia 03/12/2015  . Tinnitus 03/12/2015  . COPD (chronic obstructive pulmonary disease) ([place]) 03/12/2015  . History of colonic polyps 03/12/2015  . Bimalleolar fracture 07/02/2014  . Arthritis   . Former smoker   . GERD 01/23/2008  . MICROALBUMINURIA 01/23/2008  . Microscopic hematuria 01/23/2008  . UTI (urinary tract infection) 01/08/2008    Social History   Tobacco Use  . Smoking status: Former Smoker    Packs/day: 1.00    Years: 35.00    Pack years: 35.00    Types: Cigarettes    Quit date: 09/01/2013    Years since quitting: 5.9  . Smokeless tobacco: Never Used  . Tobacco comment: Former smoker with no desire to smoke again. Smoking years edited at patients request to include periods when she did not smoke.  Substance Use Topics  . Alcohol use: Yes    Alcohol/week: 1.0 standard drinks    Types: 1 Shots of liquor per week    Current Outpatient Medications:  .  acetaminophen (TYLENOL) 500 MG tablet, Take 500 mg by mouth every 6 (six) hours as needed., Disp: , Rfl:  .  allopurinol (ZYLOPRIM) 300 MG tablet, Take 1 tablet (300 mg total) by mouth 2 (two) times daily., Disp: 180 tablet, Rfl: 3 .  AMBULATORY NON FORMULARY MEDICATION, Rolling walker with a seat, Disp: 1 Units, Rfl: 0 .  aspirin 81 MG tablet, Take 81 mg by mouth daily., Disp: , Rfl:  .  CALCIUM-MAGNESIUM-ZINC PO, Take by mouth., Disp: , Rfl:  .  clonazePAM (KLONOPIN) 0.5 MG tablet, Take 1 tablet (0.5 mg  total) by mouth at bedtime as needed., Disp: 90 tablet, Rfl: 1 .  diazepam (VALIUM) 10 MG tablet, Take 10 mg by mouth every 6 (six) hours as needed (as needed for muscle spasms)., Disp: , Rfl:  .  diclofenac sodium (VOLTAREN) 1 % GEL, Apply topically to affected area qid, Disp: 100 g, Rfl: 2 .  gabapentin (NEURONTIN) 300 MG capsule, Take 1 capsule, orally, up to TID or as directed, Disp: 270 capsule, Rfl: 1 .  HYDROcodone-acetaminophen (NORCO) 5-325 MG tablet, Take 1 tablet by mouth every 6 (six) hours as  needed., Disp: 15 tablet, Rfl: 0 .  lisinopril (ZESTRIL) 2.5 MG tablet, TAKE 1 TABLET EVERY DAY, Disp: 90 tablet, Rfl: 0 .  meloxicam (MOBIC) 15 MG tablet, TAKE 1 TABLET BY MOUTH DAILY FOR 14 DAYS, THEN AS NEEDED, Disp: 90 tablet, Rfl: 0 .  nystatin cream (MYCOSTATIN), APPLY 1 APPLICATION TOPICALLY TO RASH IN GROIN TWICE DAILY FOR UP TO 2 WEEKS MAXIMUM, Disp: 90 g, Rfl: 2 .  OVER THE COUNTER MEDICATION, Cherry extract bid, Disp: , Rfl:  .  OVER THE COUNTER MEDICATION, B-12 one daily, Disp: , Rfl:  .  OVER THE COUNTER MEDICATION, Vitamin D, Disp: , Rfl:  .  polyethylene glycol powder (GLYCOLAX/MIRALAX) powder, Take 17 g by mouth daily., Disp: 3350 g, Rfl: 0 .  sertraline (ZOLOFT) 50 MG tablet, Take 1 tablet (50 mg total) by mouth daily., Disp: 90 tablet, Rfl: 3 .  simvastatin (ZOCOR) 20 MG tablet, TAKE 1 TABLET AT BEDTIME, Disp: 90 tablet, Rfl: 0 .  Sodium Bicarbonate (NICE PURE BAKING SODA) POWD, by Does not apply route., Disp: , Rfl:  .  traMADol (ULTRAM) 50 MG tablet, Take 1 tablet (50 mg total) by mouth every 6 (six) hours as needed for moderate pain or severe pain., Disp: 60 tablet, Rfl: 1 .  triamcinolone cream (KENALOG) 0.1 %, Apply 1 application topically 2 (two) times daily. For 7-10 days for recurring eczema issues, Disp: 80 g, Rfl: 1 .  Vitamin D, Ergocalciferol, (DRISDOL) 1.25 MG (50000 UT) CAPS capsule, Take 1 capsule (50,000 Units total) by mouth every 7 (seven) days., Disp: 12 capsule, Rfl: 0 .  azithromycin (ZITHROMAX) 250 MG tablet, Take two tablets on day 1, then one tablet daily x 4 days, Disp: 6 tablet, Rfl: 0  Allergies  Allergen Reactions  . Epinephrine Palpitations  . Codeine Nausea Only  . Penicillins Nausea Only    Objective:   VITALS: Per patient if applicable, see vitals. GENERAL: Alert, appears well and in no acute distress. HEENT: Atraumatic, conjunctiva clear, no obvious abnormalities on inspection of external nose and ears. NECK: Normal movements of the head  and neck. CARDIOPULMONARY: No increased WOB. Speaking in clear sentences. I:E ratio WNL.  MS: Moves all visible extremities without noticeable abnormality. PSYCH: Pleasant and cooperative, well-groomed. Speech normal rate and rhythm. Affect is appropriate. Insight and judgement are appropriate. Attention is focused, linear, and appropriate.  NEURO: CN grossly intact. Oriented as arrived to appointment on time with no prompting. Moves both UE equally.  SKIN: No obvious lesions, wounds, erythema, or cyanosis noted on face or hands.  Assessment and Plan:   Jameice was seen today for sore throat.  Diagnoses and all orders for this visit:  Sore throat  Other orders -     azithromycin (ZITHROMAX) 250 MG tablet; Take two tablets on day 1, then one tablet daily x 4 days   Unfortunately I am unable to do a strep test on patient.  We will go ahead and empirically treat with azithromycin given progression of symptoms, she is penicillin allergic. Did discuss that with her sx I cannot r/o COVID-19. Continue to follow CDC recommendations -- washing hands, wearing masks, avoiding non essential places. Close follow-up if any changes in symptoms. She declines COVID-19 testing at this time.  Worsening precautions advised.  . Reviewed expectations re: course of current medical issues. . Discussed self-management of symptoms. . Outlined signs and symptoms indicating need for more acute intervention. . Patient verbalized understanding and all questions were answered. [name] Health Maintenance issues including appropriate healthy diet, exercise, and smoking avoidance were discussed with patient. . See orders for this visit as documented in the electronic medical record.  I discussed the assessment and treatment plan with the patient. The patient was provided an opportunity to ask questions and all were answered. The patient agreed with the plan and demonstrated an understanding of the instructions.   The patient  was advised to call back or seek an in-person evaluation if the symptoms worsen or if the condition fails to improve as anticipated.   CMA or LPN served as scribe during this visit. History, Physical, and Plan performed by medical provider. The above documentation has been reviewed and is accurate and complete.  [place], [place] 08/13/2019

## 2019-08-13 NOTE — Progress Notes (Signed)
Office: [phone]  /  Fax: [phone] TeleHealth Visit:  Angie Velasquez has verbally consented to this TeleHealth visit today. The patient is located at home, the provider is located at the [facility] office. The participants in this visit include the listed provider and patient. The visit was conducted today via telephone call.  HPI:   Chief Complaint: OBESITY Angie Velasquez is here to discuss her progress with her obesity treatment plan. She is on the Category 2 plan and is following her eating plan approximately 50% of the time. She states she is exercising 0 minutes 0 times per week. Angie Velasquez reports her most recent weight to be 247 lbs. She is not eating all of her protein for dinner and is asking for other options for breakfast.  We were unable to weigh the patient today for this TeleHealth visit. She feels as if she has gained 4 lbs since her last visit. She has lost 5 lbs since starting treatment with [name].  Hyperlipidemia Angie Velasquez has hyperlipidemia and has been trying to improve her cholesterol levels with intensive lifestyle modification including a low saturated fat diet, exercise and weight loss. She is on simvastatin and denies any chest pain.   ASSESSMENT AND PLAN:  Other hyperlipidemia  Class 2 severe obesity with serious comorbidity and body mass index (BMI) of 36.0 to 36.9 in adult, unspecified obesity type ([place])  PLAN:  Hyperlipidemia Angie Velasquez was informed of the American Heart Association Guidelines emphasizing intensive lifestyle modifications as the first line treatment for hyperlipidemia. We discussed many lifestyle modifications today in depth, and Angie Velasquez will continue to work on decreasing saturated fats such as fatty red meat, butter and many fried foods. She will continue simvastatin, increase vegetables and lean protein in her diet, and continue to work on exercise and weight loss efforts.  Obesity Angie Velasquez is currently in the action stage  of change. As such, her goal is to continue with weight loss efforts. She has agreed to follow the Category 2 plan. Angie Velasquez has been instructed to work up to a goal of 150 minutes of combined cardio and strengthening exercise per week for weight loss and overall health benefits. We discussed the following Behavioral Modification Strategies today: increasing lean protein intake and no skipping meals.  Angie Velasquez has agreed to follow-up with our clinic in 2 weeks. She was informed of the importance of frequent follow-up visits to maximize her success with intensive lifestyle modifications for her multiple health conditions.  ALLERGIES: Allergies  Allergen Reactions   Epinephrine Palpitations   Codeine Nausea Only   Penicillins Nausea Only    MEDICATIONS: Current Outpatient Medications on File Prior to Visit  Medication Sig Dispense Refill   acetaminophen (TYLENOL) 500 MG tablet Take 500 mg by mouth every 6 (six) hours as needed.     allopurinol (ZYLOPRIM) 300 MG tablet Take 1 tablet (300 mg total) by mouth 2 (two) times daily. 180 tablet 3   AMBULATORY NON FORMULARY MEDICATION Rolling walker with a seat 1 Units 0   aspirin 81 MG tablet Take 81 mg by mouth daily.     CALCIUM-MAGNESIUM-ZINC PO Take by mouth.     clonazePAM (KLONOPIN) 0.5 MG tablet Take 1 tablet (0.5 mg total) by mouth at bedtime as needed. 90 tablet 1   diazepam (VALIUM) 10 MG tablet Take 10 mg by mouth every 6 (six) hours as needed (as needed for muscle spasms).     diclofenac sodium (VOLTAREN) 1 % GEL Apply topically to affected area qid  100 g 2   gabapentin (NEURONTIN) 300 MG capsule Take 1 capsule, orally, up to TID or as directed 270 capsule 1   HYDROcodone-acetaminophen (NORCO) 5-325 MG tablet Take 1 tablet by mouth every 6 (six) hours as needed. 15 tablet 0   lisinopril (ZESTRIL) 2.5 MG tablet TAKE 1 TABLET EVERY DAY 90 tablet 0   meloxicam (MOBIC) 15 MG tablet TAKE 1 TABLET BY MOUTH DAILY FOR 14 DAYS,  THEN AS NEEDED 90 tablet 0   nystatin cream (MYCOSTATIN) APPLY 1 APPLICATION TOPICALLY TO RASH IN GROIN TWICE DAILY FOR UP TO 2 WEEKS MAXIMUM 90 g 2   OVER THE COUNTER MEDICATION Cherry extract bid     OVER THE COUNTER MEDICATION B-12 one daily     OVER THE COUNTER MEDICATION Vitamin D     polyethylene glycol powder (GLYCOLAX/MIRALAX) powder Take 17 g by mouth daily. 3350 g 0   sertraline (ZOLOFT) 50 MG tablet Take 1 tablet (50 mg total) by mouth daily. 90 tablet 3   simvastatin (ZOCOR) 20 MG tablet TAKE 1 TABLET AT BEDTIME 90 tablet 0   Sodium Bicarbonate (NICE PURE BAKING SODA) POWD by Does not apply route.     traMADol (ULTRAM) 50 MG tablet Take 1 tablet (50 mg total) by mouth every 6 (six) hours as needed for moderate pain or severe pain. 60 tablet 1   triamcinolone cream (KENALOG) 0.1 % Apply 1 application topically 2 (two) times daily. For 7-10 days for recurring eczema issues 80 g 1   Vitamin D, Ergocalciferol, (DRISDOL) 1.25 MG (50000 UT) CAPS capsule Take 1 capsule (50,000 Units total) by mouth every 7 (seven) days. 12 capsule 0   No current facility-administered medications on file prior to visit.     PAST MEDICAL HISTORY: Past Medical History:  Diagnosis Date   Anxiety    years ago - no longer an issue   Arthritis    gout   Cataracts, bilateral    Complication of anesthesia    Fracture of pelvis (HCC)    GERD (gastroesophageal reflux disease)    Hx of chest pain    "anxiety"   Hyperlipidemia    INSOMNIA 10/14/2008   in past   Joint pain    Kidney problem    Low blood sugar    eats several smaller meals a day   Obesity    Palpitations    Stress related    Proteinuria    H/O   Tobacco abuse    quit smoking 08/2013    PAST SURGICAL HISTORY: Past Surgical History:  Procedure Laterality Date   ABDOMINAL HYSTERECTOMY     APPENDECTOMY     with hysterectomy   childbirth     x4   CHOLECYSTECTOMY  2001   EYE SURGERY Bilateral 2015     cataract surgery with lens implant   NEPHRECTOMY  1992   complex cystic mass - nephrectomy right   ORIF ANKLE FRACTURE Right 07/02/2014   Procedure: OPEN REDUCTION INTERNAL FIXATION (ORIF) RIGHT ANKLE BIMALLOELAR FRACTURE;  Surgeon: Wylene Simmer, MD;  Location: [facility];  Service: Orthopedics;  Laterality: Right;   OTHER SURGICAL HISTORY     hysterectomy, R ovary remains   SHOULDER SURGERY     bone spurs left    SOCIAL HISTORY: Social History   Tobacco Use   Smoking status: Former Smoker    Packs/day: 1.00    Years: 35.00    Pack years: 35.00    Types: Cigarettes    Quit  date: 09/01/2013    Years since quitting: 5.9   Smokeless tobacco: Never Used   Tobacco comment: Former smoker with no desire to smoke again. Smoking years edited at patients request to include periods when she did not smoke.  Substance Use Topics   Alcohol use: Yes    Alcohol/week: 1.0 standard drinks    Types: 1 Shots of liquor per week   Drug use: No    FAMILY HISTORY: Family History  Problem Relation Age of Onset   Stroke Father    Alzheimer's disease Father        early, states also parkinsons. died 37   Hypertension Father    Obesity Father    Ovarian cancer Mother        Dr. Jana Hakim    Arthritis Mother    Obesity Mother    Colon cancer Paternal Grandmother    Liver disease Daughter        On hospice age 23   ROS: Review of Systems  Cardiovascular: Negative for chest pain.   PHYSICAL EXAM: Pt in no acute distress  RECENT LABS AND TESTS: BMET    Component Value Date/Time   NA 141 01/09/2019 1126   K 4.5 01/09/2019 1126   CL 101 01/09/2019 1126   CO2 22 01/09/2019 1126   GLUCOSE 107 (H) 01/09/2019 1126   GLUCOSE 109 (H) 01/18/2017 0902   BUN 12 01/09/2019 1126   CREATININE 1.07 (H) 01/09/2019 1126   CALCIUM 9.7 01/09/2019 1126   GFRNONAA 52 (L) 01/09/2019 1126   GFRAA 60 01/09/2019 1126   Lab Results  Component Value Date   HGBA1C 5.7 (H) 01/09/2019   HGBA1C  5.8 (H) 07/25/2018   HGBA1C 5.7 (H) 09/18/2017   HGBA1C (H) 04/15/2010    5.9 (NOTE)                                                                       According to the ADA Clinical Practice Recommendations for 2011, when HbA1c is used as a screening test:   >=6.5%   Diagnostic of Diabetes Mellitus           (if abnormal result  is confirmed)  5.7-6.4%   Increased risk of developing Diabetes Mellitus  References:Diagnosis and Classification of Diabetes Mellitus,Diabetes [id number] 1):S62-S69 and Standards of Medical Care in         Diabetes - 2011,Diabetes [id number]  (Suppl 1):S11-S61.   HGBA1C 5.8 12/31/2007   Lab Results  Component Value Date   INSULIN 18.6 01/09/2019   INSULIN 22.0 07/25/2018   INSULIN 25.3 (H) 09/18/2017   CBC    Component Value Date/Time   WBC 6.4 09/18/2017 1049   WBC 6.6 01/18/2017 0902   RBC 4.57 09/18/2017 1049   RBC 4.39 01/18/2017 0902   HGB 13.5 09/18/2017 1049   HCT 40.4 09/18/2017 1049   PLT 240.0 01/18/2017 0902   MCV 88 09/18/2017 1049   MCH 29.5 09/18/2017 1049   MCH 29.4 07/02/2014 1143   MCHC 33.4 09/18/2017 1049   MCHC 33.2 01/18/2017 0902   RDW 15.2 09/18/2017 1049   LYMPHSABS 2.6 09/18/2017 1049   MONOABS 0.9 04/15/2010 2135   EOSABS 0.2 09/18/2017 1049   BASOSABS 0.0 09/18/2017 1049  Iron/TIBC/Ferritin/ %Sat No results found for: IRON, TIBC, FERRITIN, IRONPCTSAT Lipid Panel     Component Value Date/Time   CHOL 215 (H) 01/09/2019 1126   TRIG 210 (H) 01/09/2019 1126   HDL 61 01/09/2019 1126   CHOLHDL 2 01/18/2017 0902   VLDL 28.2 01/18/2017 0902   LDLCALC 112 (H) 01/09/2019 1126   LDLDIRECT 77.0 01/18/2017 0902   Hepatic Function Panel     Component Value Date/Time   PROT 7.9 01/09/2019 1126   ALBUMIN 4.6 01/09/2019 1126   AST 29 01/09/2019 1126   ALT 28 01/09/2019 1126   ALKPHOS 94 01/09/2019 1126   BILITOT 0.3 01/09/2019 1126   BILIDIR <0.1 04/15/2010 2135   IBILI NOT CALCULATED 04/15/2010 2135        Component Value Date/Time   TSH 3.430 09/18/2017 1049   TSH 2.09 05/25/2015 0820   TSH 1.89 12/31/2007 0831    Results for Judie Bonus ELIZABETH "[name]" (MRN [id number]) as of 08/13/2019 07:38  Ref. Range 01/09/2019 11:26  Vitamin D, 25-Hydroxy Latest Ref Range: 30.0 - 100.0 ng/mL 39.4   I, Michaelene Song, am acting as Location manager for [employer], PA-C I, Abby Potash, PA-C have reviewed above note and agree with its content

## 2019-08-17 ENCOUNTER — Encounter: Payer: Self-pay | Admitting: Family Medicine

## 2019-08-17 ENCOUNTER — Encounter (INDEPENDENT_AMBULATORY_CARE_PROVIDER_SITE_OTHER): Payer: Self-pay | Admitting: Physician Assistant

## 2019-08-18 ENCOUNTER — Telehealth (INDEPENDENT_AMBULATORY_CARE_PROVIDER_SITE_OTHER): Payer: Medicare HMO | Admitting: Physician Assistant

## 2019-08-18 NOTE — Telephone Encounter (Signed)
Pt called about her sore throat again and stated it is very painful and causing an issue with her ears/ please advise

## 2019-08-19 ENCOUNTER — Telehealth: Payer: Self-pay

## 2019-08-19 ENCOUNTER — Encounter: Payer: Self-pay | Admitting: Family Medicine

## 2019-08-19 DIAGNOSIS — J029 Acute pharyngitis, unspecified: Secondary | ICD-10-CM

## 2019-08-19 MED ORDER — AMOXICILLIN-POT CLAVULANATE 875-125 MG PO TABS: 1.0000 | ORAL_TABLET | Freq: Two times a day (BID) | ORAL | 0 refills | Status: DC

## 2019-08-19 NOTE — Telephone Encounter (Signed)
Rx sent in today at 2:26 PM. Send message to pt via MyChart.

## 2019-08-19 NOTE — Telephone Encounter (Signed)
Rx pending. Dr. Yong Channel, please confirm dose, directions, and qty.  Thanks!

## 2019-08-19 NOTE — Addendum Note (Signed)
Addended by: Jasper Loser on: 08/19/2019 01:54 PM   Modules accepted: Orders

## 2019-08-19 NOTE — Telephone Encounter (Signed)
Copied from [place] [phone]. Topic: General - Other >> Aug 19, 2019  2:29 PM Keene Breath wrote: Reason for CRM: Patient is returning a message to [place].  She would like the medication as soon as possible.  She is having some issues and pharmacy does not have it yet.  CB# [phone]

## 2019-08-20 ENCOUNTER — Other Ambulatory Visit: Payer: Self-pay | Admitting: Cardiology

## 2019-08-20 DIAGNOSIS — Z20822 Contact with and (suspected) exposure to covid-19: Secondary | ICD-10-CM

## 2019-08-20 DIAGNOSIS — R6889 Other general symptoms and signs: Secondary | ICD-10-CM | POA: Diagnosis not present

## 2019-08-22 ENCOUNTER — Telehealth: Payer: Self-pay | Admitting: Physical Therapy

## 2019-08-22 LAB — NOVEL CORONAVIRUS, NAA: SARS-CoV-2, NAA: NOT DETECTED

## 2019-08-22 NOTE — Telephone Encounter (Signed)
Copied from [place] [phone]. Topic: General - Other >> Aug 22, 2019  2:11 PM Leward Quan A wrote: Reason for CRM: Patient called to say that her covid test was negative but now her ears are throbbing again from the inside asking for a call back please. Ph# [phone]

## 2019-08-25 DIAGNOSIS — Z1231 Encounter for screening mammogram for malignant neoplasm of breast: Secondary | ICD-10-CM | POA: Diagnosis not present

## 2019-08-25 DIAGNOSIS — R87619 Unspecified abnormal cytological findings in specimens from cervix uteri: Secondary | ICD-10-CM | POA: Diagnosis not present

## 2019-08-25 DIAGNOSIS — Z9189 Other specified personal risk factors, not elsewhere classified: Secondary | ICD-10-CM | POA: Diagnosis not present

## 2019-08-25 DIAGNOSIS — Z8742 Personal history of other diseases of the female genital tract: Secondary | ICD-10-CM | POA: Diagnosis not present

## 2019-08-25 LAB — HM MAMMOGRAPHY

## 2019-08-25 NOTE — Telephone Encounter (Signed)
Noted, glad pt is feeling better.

## 2019-08-25 NOTE — Telephone Encounter (Signed)
I contacted the patient to schedule. She declined and said that she was better. She said she would reach out to [name] to do a virtual visit if needed as she is about to travel to [place] until the end of the year. No further action required, routing to notify team.

## 2019-08-25 NOTE — Telephone Encounter (Signed)
Please call pt to schedule next available in office visit for eval of sx. Can be with different provider if needed.   Thanks!

## 2019-08-26 ENCOUNTER — Ambulatory Visit (INDEPENDENT_AMBULATORY_CARE_PROVIDER_SITE_OTHER): Payer: Medicare HMO | Admitting: Physician Assistant

## 2019-08-26 ENCOUNTER — Encounter: Payer: Self-pay | Admitting: Family Medicine

## 2019-08-26 DIAGNOSIS — R87619 Unspecified abnormal cytological findings in specimens from cervix uteri: Secondary | ICD-10-CM | POA: Diagnosis not present

## 2019-08-28 ENCOUNTER — Other Ambulatory Visit: Payer: Self-pay

## 2019-08-28 ENCOUNTER — Ambulatory Visit (INDEPENDENT_AMBULATORY_CARE_PROVIDER_SITE_OTHER): Payer: Medicare HMO | Admitting: Physician Assistant

## 2019-08-28 ENCOUNTER — Encounter (INDEPENDENT_AMBULATORY_CARE_PROVIDER_SITE_OTHER): Payer: Self-pay | Admitting: Physician Assistant

## 2019-08-28 VITALS — BP 93/62 | HR 82 | Temp 98.3°F | Ht 68.0 in | Wt 246.0 lb

## 2019-08-28 DIAGNOSIS — M1040 Other secondary gout, unspecified site: Secondary | ICD-10-CM | POA: Diagnosis not present

## 2019-08-28 DIAGNOSIS — E7849 Other hyperlipidemia: Secondary | ICD-10-CM

## 2019-08-28 DIAGNOSIS — Z6837 Body mass index (BMI) 37.0-37.9, adult: Secondary | ICD-10-CM | POA: Diagnosis not present

## 2019-08-28 DIAGNOSIS — R7303 Prediabetes: Secondary | ICD-10-CM | POA: Diagnosis not present

## 2019-08-28 DIAGNOSIS — E559 Vitamin D deficiency, unspecified: Secondary | ICD-10-CM | POA: Diagnosis not present

## 2019-08-28 MED ORDER — VITAMIN D (ERGOCALCIFEROL) 1.25 MG (50000 UNIT) PO CAPS: 50000.0000 [IU] | ORAL_CAPSULE | ORAL | 0 refills | Status: DC

## 2019-08-29 LAB — CBC WITH DIFFERENTIAL/PLATELET
Basophils Absolute: 0.1 10*3/uL (ref 0.0–0.2)
Basos: 1 %
EOS (ABSOLUTE): 0.2 10*3/uL (ref 0.0–0.4)
Eos: 3 %
Hematocrit: 40.3 % (ref 34.0–46.6)
Hemoglobin: 13.4 g/dL (ref 11.1–15.9)
Immature Grans (Abs): 0 10*3/uL (ref 0.0–0.1)
Immature Granulocytes: 0 %
Lymphocytes Absolute: 3 10*3/uL (ref 0.7–3.1)
Lymphs: 42 %
MCH: 29.9 pg (ref 26.6–33.0)
MCHC: 33.3 g/dL (ref 31.5–35.7)
MCV: 90 fL (ref 79–97)
Monocytes Absolute: 0.6 10*3/uL (ref 0.1–0.9)
Monocytes: 8 %
Neutrophils Absolute: 3.2 10*3/uL (ref 1.4–7.0)
Neutrophils: 46 %
Platelets: 242 10*3/uL (ref 150–450)
RBC: 4.48 x10E6/uL (ref 3.77–5.28)
RDW: 14.2 % (ref 11.7–15.4)
WBC: 7.1 10*3/uL (ref 3.4–10.8)

## 2019-08-29 LAB — LIPID PANEL WITH LDL/HDL RATIO
Cholesterol, Total: 183 mg/dL (ref 100–199)
HDL: 56 mg/dL (ref >39)
LDL Chol Calc (NIH): 88 mg/dL (ref 0–99)
LDL/HDL Ratio: 1.6 ratio (ref 0.0–3.2)
Triglycerides: 236 mg/dL — ABNORMAL HIGH (ref 0–149)
VLDL Cholesterol Cal: 39 mg/dL (ref 5–40)

## 2019-08-29 LAB — VITAMIN D 25 HYDROXY (VIT D DEFICIENCY, FRACTURES): Vit D, 25-Hydroxy: 42.1 ng/mL (ref 30.0–100.0)

## 2019-08-29 LAB — COMPREHENSIVE METABOLIC PANEL
ALT: 26 IU/L (ref 0–32)
AST: 25 IU/L (ref 0–40)
Albumin/Globulin Ratio: 1.8 (ref 1.2–2.2)
Albumin: 4.2 g/dL (ref 3.7–4.7)
Alkaline Phosphatase: 117 IU/L (ref 39–117)
BUN/Creatinine Ratio: 12 (ref 12–28)
BUN: 14 mg/dL (ref 8–27)
Bilirubin Total: 0.3 mg/dL (ref 0.0–1.2)
CO2: 22 mmol/L (ref 20–29)
Calcium: 9.3 mg/dL (ref 8.7–10.3)
Chloride: 105 mmol/L (ref 96–106)
Creatinine, Ser: 1.14 mg/dL — ABNORMAL HIGH (ref 0.57–1.00)
GFR calc Af Amer: 56 mL/min/{1.73_m2} — ABNORMAL LOW (ref >59)
GFR calc non Af Amer: 48 mL/min/{1.73_m2} — ABNORMAL LOW (ref >59)
Globulin, Total: 2.3 g/dL (ref 1.5–4.5)
Glucose: 101 mg/dL — ABNORMAL HIGH (ref 65–99)
Potassium: 3.9 mmol/L (ref 3.5–5.2)
Sodium: 144 mmol/L (ref 134–144)
Total Protein: 6.5 g/dL (ref 6.0–8.5)

## 2019-08-29 LAB — HEMOGLOBIN A1C
Est. average glucose Bld gHb Est-mCnc: 120 mg/dL
Hgb A1c MFr Bld: 5.8 % — ABNORMAL HIGH (ref 4.8–5.6)

## 2019-08-29 LAB — INSULIN, RANDOM: INSULIN: 25.2 u[IU]/mL — ABNORMAL HIGH (ref 2.6–24.9)

## 2019-08-29 LAB — URIC ACID: Uric Acid: 4.2 mg/dL (ref 2.5–7.1)

## 2019-09-02 NOTE — Progress Notes (Signed)
Office: [phone]  /  Fax: [phone]   HPI:   Chief Complaint: OBESITY Angie Velasquez is here to discuss her progress with her obesity treatment plan. She is on the  follow the Category 2 plan and is following her eating plan approximately 60 % of the time. She states she is exercising 0 minutes 0 times per week. Buddy reports that she is sleeping in later so she is skipping breakfast. She is eating 2 meals a day and sometimes adding a protein shake.  Her weight is 246 lb (111.6 kg) today and has had a weight gain of 2 pounds over a period of 7 months since her last in office visit. She has lost 3 lbs since starting treatment with [name].  Vitamin D deficiency Angie Velasquez has a diagnosis of vitamin D deficiency. She is currently taking vit D and denies nausea, vomiting or muscle weakness.  Pre-Diabetes Angie Velasquez has a diagnosis of prediabetes based on her elevated HgA1c and was informed this puts her at greater risk of developing diabetes. She is not taking metformin currently and continues to work on diet and exercise to decrease risk of diabetes. She denies nausea, polyphagia, or hypoglycemia.  Gout Angie Velasquez has a history of Gout. She is complaining of having pain in left big toe.   Hyperlipidemia Angie Velasquez has hyperlipidemia and has been trying to improve her cholesterol levels with intensive lifestyle modification including a low saturated fat diet, exercise and weight loss. She denies any chest pain, claudication or myalgias. She is currently taking simvastatin.   ASSESSMENT AND PLAN:  Vitamin D deficiency - Plan: VITAMIN D 25 Hydroxy (Vit-D Deficiency, Fractures), Vitamin D, Ergocalciferol, (DRISDOL) 1.25 MG (50000 UT) CAPS capsule  Prediabetes - Plan: Hemoglobin A1c, Insulin, random, Comprehensive metabolic panel  Other secondary gout, unspecified chronicity, unspecified site - Plan: CBC with Differential/Platelet, Uric acid  Other hyperlipidemia - Plan: Lipid Panel With LDL/HDL Ratio   Class 2 severe obesity with serious comorbidity and body mass index (BMI) of 37.0 to 37.9 in adult, unspecified obesity type ([place])  PLAN: Vitamin D Deficiency Angie Velasquez was informed that low vitamin D levels contributes to fatigue and are associated with obesity, breast, and colon cancer. She agrees to continue to take prescription Vit D @50 ,000 IU every week #4 with no refills sent today and will follow up for routine testing of vitamin D, at least 2-3 times per year. She was informed of the risk of over-replacement of vitamin D and agrees to not increase her dose unless she discusses this with [name] first. We will repeat vit D level today. Agrees to follow up with our clinic as directed.   Pre-Diabetes Angie Velasquez will continue to work on weight loss, exercise, and decreasing simple carbohydrates in her diet to help decrease the risk of diabetes. We dicussed metformin including benefits and risks. She was informed that eating too many simple carbohydrates or too many calories at one sitting increases the likelihood of GI side effects. We will repeat HgA1c and Insulin level today.  Angie Velasquez agreed to follow up with [name] as directed to monitor her progress.  Gout Discussed Gout with Angie Velasquez today. We will check uric acid level and CBC today.   Hyperlipidemia Kavita was informed of the American Heart Association Guidelines emphasizing intensive lifestyle modifications as the first line treatment for hyperlipidemia. We discussed many lifestyle modifications today in depth, and Baljinder will continue to work on decreasing saturated fats such as fatty red meat, butter and many fried foods. She will also increase  vegetables and lean protein in her diet and continue to work on exercise and weight loss efforts. We will repeat Lipid panel and CMP today.   Obesity Angie Velasquez is currently in the action stage of change. As such, her goal is to continue with weight loss efforts She has agreed to follow the Category 2 plan  Angie Velasquez has been instructed to work up to a goal of 150 minutes of combined cardio and strengthening exercise per week for weight loss and overall health benefits. We discussed the following Behavioral Modification Strategies today: increasing lean protein intake and no skipping meals.    [name] has agreed to follow up with our clinic in 2 weeks. She was informed of the importance of frequent follow up visits to maximize her success with intensive lifestyle modifications for her multiple health conditions.  ALLERGIES: Allergies  Allergen Reactions  . Epinephrine Palpitations  . Codeine Nausea Only  . Penicillins Nausea Only    MEDICATIONS: Current Outpatient Medications on File Prior to Visit  Medication Sig Dispense Refill  . acetaminophen (TYLENOL) 500 MG tablet Take 500 mg by mouth every 6 (six) hours as needed.    [name] allopurinol (ZYLOPRIM) 300 MG tablet Take 1 tablet (300 mg total) by mouth 2 (two) times daily. 180 tablet 3  . AMBULATORY NON FORMULARY MEDICATION Rolling walker with a seat 1 Units 0  . amoxicillin-clavulanate (AUGMENTIN) 875-125 MG tablet Take 1 tablet by mouth 2 (two) times daily. 20 tablet 0  . aspirin 81 MG tablet Take 81 mg by mouth daily.    [name] azithromycin (ZITHROMAX) 250 MG tablet Take two tablets on day 1, then one tablet daily x 4 days 6 tablet 0  . CALCIUM-MAGNESIUM-ZINC PO Take by mouth.    . clonazePAM (KLONOPIN) 0.5 MG tablet Take 1 tablet (0.5 mg total) by mouth at bedtime as needed. 90 tablet 1  . diazepam (VALIUM) 10 MG tablet Take 10 mg by mouth every 6 (six) hours as needed (as needed for muscle spasms).    . diclofenac sodium (VOLTAREN) 1 % GEL Apply topically to affected area qid 100 g 2  . gabapentin (NEURONTIN) 300 MG capsule Take 1 capsule, orally, up to TID or as directed 270 capsule 1  . HYDROcodone-acetaminophen (NORCO) 5-325 MG tablet Take 1 tablet by mouth every 6 (six) hours as needed. 15 tablet 0  . lisinopril (ZESTRIL) 2.5 MG tablet TAKE  1 TABLET EVERY DAY 90 tablet 0  . meloxicam (MOBIC) 15 MG tablet TAKE 1 TABLET BY MOUTH DAILY FOR 14 DAYS, THEN AS NEEDED 90 tablet 0  . nystatin cream (MYCOSTATIN) APPLY 1 APPLICATION TOPICALLY TO RASH IN GROIN TWICE DAILY FOR UP TO 2 WEEKS MAXIMUM 90 g 2  . OVER THE COUNTER MEDICATION Cherry extract bid    . OVER THE COUNTER MEDICATION B-12 one daily    . OVER THE COUNTER MEDICATION Vitamin D    . polyethylene glycol powder (GLYCOLAX/MIRALAX) powder Take 17 g by mouth daily. 3350 g 0  . sertraline (ZOLOFT) 50 MG tablet Take 1 tablet (50 mg total) by mouth daily. 90 tablet 3  . simvastatin (ZOCOR) 20 MG tablet TAKE 1 TABLET AT BEDTIME 90 tablet 0  . Sodium Bicarbonate (NICE PURE BAKING SODA) POWD by Does not apply route.    . traMADol (ULTRAM) 50 MG tablet Take 1 tablet (50 mg total) by mouth every 6 (six) hours as needed for moderate pain or severe pain. 60 tablet 1  . triamcinolone cream (KENALOG) 0.1 %  Apply 1 application topically 2 (two) times daily. For 7-10 days for recurring eczema issues 80 g 1   No current facility-administered medications on file prior to visit.     PAST MEDICAL HISTORY: Past Medical History:  Diagnosis Date  . Anxiety    years ago - no longer an issue  . Arthritis    gout  . Cataracts, bilateral   . Complication of anesthesia   . Fracture of pelvis ([place])   . GERD (gastroesophageal reflux disease)   . Hx of chest pain    "anxiety"  . Hyperlipidemia   . INSOMNIA 10/14/2008   in past  . Joint pain   . Kidney problem   . Low blood sugar    eats several smaller meals a day  . Obesity   . Palpitations    Stress related   . Proteinuria    H/O  . Tobacco abuse    quit smoking 08/2013    PAST SURGICAL HISTORY: Past Surgical History:  Procedure Laterality Date  . ABDOMINAL HYSTERECTOMY    . APPENDECTOMY     with hysterectomy  . childbirth     x4  . CHOLECYSTECTOMY  2001  . EYE SURGERY Bilateral 2015   cataract surgery with lens implant  .  NEPHRECTOMY  1992   complex cystic mass - nephrectomy right  . ORIF ANKLE FRACTURE Right 07/02/2014   Procedure: OPEN REDUCTION INTERNAL FIXATION (ORIF) RIGHT ANKLE BIMALLOELAR FRACTURE;  Surgeon: Wylene Simmer, MD;  Location: [facility];  Service: Orthopedics;  Laterality: Right;  . OTHER SURGICAL HISTORY     hysterectomy, R ovary remains  . SHOULDER SURGERY     bone spurs left    SOCIAL HISTORY: Social History   Tobacco Use  . Smoking status: Former Smoker    Packs/day: 1.00    Years: 35.00    Pack years: 35.00    Types: Cigarettes    Quit date: 09/01/2013    Years since quitting: 6.0  . Smokeless tobacco: Never Used  . Tobacco comment: Former smoker with no desire to smoke again. Smoking years edited at patients request to include periods when she did not smoke.  Substance Use Topics  . Alcohol use: Yes    Alcohol/week: 1.0 standard drinks    Types: 1 Shots of liquor per week  . Drug use: No    FAMILY HISTORY: Family History  Problem Relation Age of Onset  . Stroke Father   . Alzheimer's disease Father        early, states also parkinsons. died 31  . Hypertension Father   . Obesity Father   . Ovarian cancer Mother        Dr. Jana Hakim   . Arthritis Mother   . Obesity Mother   . Colon cancer Paternal Grandmother   . Liver disease Daughter        On hospice age 71    ROS: Review of Systems  Constitutional: Negative for weight loss.  Cardiovascular: Negative for chest pain and claudication.  Gastrointestinal: Negative for nausea and vomiting.  Musculoskeletal: Negative for myalgias.       Negative for muscle weakness Positive for left toe pain   Endo/Heme/Allergies:       Negative for hypoglycemia  Negative for polyphagia     PHYSICAL EXAM: Blood pressure 93/62, pulse 82, temperature 98.3 F (36.8 C), temperature source Oral, height 5\' 8"  (1.727 m), weight 246 lb (111.6 kg), SpO2 96 %. Body mass index is 37.4 kg/m. Physical  Exam Vitals signs reviewed.   Constitutional:      Appearance: Normal appearance. She is obese.  HENT:     Head: Normocephalic.     Nose: Nose normal.  Neck:     Musculoskeletal: Normal range of motion.  Cardiovascular:     Rate and Rhythm: Normal rate.  Pulmonary:     Effort: Pulmonary effort is normal.  Musculoskeletal: Normal range of motion.  Skin:    General: Skin is warm and dry.  Neurological:     Mental Status: She is alert and oriented to person, place, and time.  Psychiatric:        Mood and Affect: Mood normal.        Behavior: Behavior normal.     RECENT LABS AND TESTS: BMET    Component Value Date/Time   NA 144 08/28/2019 0803   K 3.9 08/28/2019 0803   CL 105 08/28/2019 0803   CO2 22 08/28/2019 0803   GLUCOSE 101 (H) 08/28/2019 0803   GLUCOSE 109 (H) 01/18/2017 0902   BUN 14 08/28/2019 0803   CREATININE 1.14 (H) 08/28/2019 0803   CALCIUM 9.3 08/28/2019 0803   GFRNONAA 48 (L) 08/28/2019 0803   GFRAA 56 (L) 08/28/2019 0803   Lab Results  Component Value Date   HGBA1C 5.8 (H) 08/28/2019   HGBA1C 5.7 (H) 01/09/2019   HGBA1C 5.8 (H) 07/25/2018   HGBA1C 5.7 (H) 09/18/2017   HGBA1C (H) 04/15/2010    5.9 (NOTE)                                                                       According to the ADA Clinical Practice Recommendations for 2011, when HbA1c is used as a screening test:   >=6.5%   Diagnostic of Diabetes Mellitus           (if abnormal result  is confirmed)  5.7-6.4%   Increased risk of developing Diabetes Mellitus  References:Diagnosis and Classification of Diabetes Mellitus,Diabetes [id number] 1):S62-S69 and Standards of Medical Care in         Diabetes - 2011,Diabetes Care,2011,34  (Suppl 1):S11-S61.   Lab Results  Component Value Date   INSULIN 25.2 (H) 08/28/2019   INSULIN 18.6 01/09/2019   INSULIN 22.0 07/25/2018   INSULIN 25.3 (H) 09/18/2017   CBC    Component Value Date/Time   WBC 7.1 08/28/2019 0803   WBC 6.6 01/18/2017 0902   RBC 4.48 08/28/2019  0803   RBC 4.39 01/18/2017 0902   HGB 13.4 08/28/2019 0803   HCT 40.3 08/28/2019 0803   PLT 242 08/28/2019 0803   MCV 90 08/28/2019 0803   MCH 29.9 08/28/2019 0803   MCH 29.4 07/02/2014 1143   MCHC 33.3 08/28/2019 0803   MCHC 33.2 01/18/2017 0902   RDW 14.2 08/28/2019 0803   LYMPHSABS 3.0 08/28/2019 0803   MONOABS 0.9 04/15/2010 2135   EOSABS 0.2 08/28/2019 0803   BASOSABS 0.1 08/28/2019 0803   Iron/TIBC/Ferritin/ %Sat No results found for: IRON, TIBC, FERRITIN, IRONPCTSAT Lipid Panel     Component Value Date/Time   CHOL 183 08/28/2019 0803   TRIG 236 (H) 08/28/2019 0803   HDL 56 08/28/2019 0803   CHOLHDL 2 01/18/2017 0902   VLDL 28.2 01/18/2017 0902  LDLCALC 88 08/28/2019 0803   LDLDIRECT 77.0 01/18/2017 0902   Hepatic Function Panel     Component Value Date/Time   PROT 6.5 08/28/2019 0803   ALBUMIN 4.2 08/28/2019 0803   AST 25 08/28/2019 0803   ALT 26 08/28/2019 0803   ALKPHOS 117 08/28/2019 0803   BILITOT 0.3 08/28/2019 0803   BILIDIR <0.1 04/15/2010 2135   IBILI NOT CALCULATED 04/15/2010 2135      Component Value Date/Time   TSH 3.430 09/18/2017 1049   TSH 2.09 05/25/2015 0820   TSH 1.89 12/31/2007 0831     Ref. Range 08/28/2019 08:03  Vitamin D, 25-Hydroxy Latest Ref Range: 30.0 - 100.0 ng/mL 42.1     OBESITY BEHAVIORAL INTERVENTION VISIT  Today's visit was # 23  Starting weight: 249 lbs Starting date: 09/18/17 Today's weight : Weight: 246 lb (111.6 kg)  Today's date: 08/28/19 Total lbs lost to date: 3 lbs At least 15 minutes were spent on discussing the following behavioral intervention visit.   ASK: We discussed the diagnosis of obesity with [name] today and Yessenia agreed to give [name] permission to discuss obesity behavioral modification therapy today.  ASSESS: Briceyda has the diagnosis of obesity and her BMI today is 37.41 Leahna is in the action stage of change   ADVISE: Reanna was educated on the multiple health risks  of obesity as well as the benefit of weight loss to improve her health. She was advised of the need for long term treatment and the importance of lifestyle modifications to improve her current health and to decrease her risk of future health problems.  AGREE: Multiple dietary modification options and treatment options were discussed and  Allora agreed to follow the recommendations documented in the above note.  ARRANGE: Adley was educated on the importance of frequent visits to treat obesity as outlined per CMS and USPSTF guidelines and agreed to schedule her next follow up appointment today.  Leary Roca, am acting as transcriptionist for Abby Potash, PA-C  I, Abby Potash, PA-C have reviewed above note and agree with its content

## 2019-09-05 ENCOUNTER — Encounter: Payer: Self-pay | Admitting: Family Medicine

## 2019-09-05 ENCOUNTER — Other Ambulatory Visit: Payer: Self-pay | Admitting: Family Medicine

## 2019-09-15 ENCOUNTER — Encounter (INDEPENDENT_AMBULATORY_CARE_PROVIDER_SITE_OTHER): Payer: Self-pay | Admitting: Physician Assistant

## 2019-09-15 ENCOUNTER — Other Ambulatory Visit: Payer: Self-pay

## 2019-09-15 ENCOUNTER — Telehealth (INDEPENDENT_AMBULATORY_CARE_PROVIDER_SITE_OTHER): Payer: Medicare HMO | Admitting: Physician Assistant

## 2019-09-15 DIAGNOSIS — Z6837 Body mass index (BMI) 37.0-37.9, adult: Secondary | ICD-10-CM

## 2019-09-15 DIAGNOSIS — E559 Vitamin D deficiency, unspecified: Secondary | ICD-10-CM

## 2019-09-16 NOTE — Progress Notes (Signed)
Office: [phone]  /  Fax: [phone] TeleHealth Visit:  Angie Velasquez has verbally consented to this TeleHealth visit today. The patient is located at home, the provider is located at the [facility] office. The participants in this visit include the listed provider and patient and any and all parties involved. The visit was conducted today via WebEx.  HPI:   Chief Complaint: OBESITY Angie Velasquez is here to discuss her progress with her obesity treatment plan. She is on the Category 2 plan and is following her eating plan approximately 40 to 50 % of the time. She states she is doing limited exercise. Angie Velasquez reports that she is not getting enough protein and she continues to eat some simple carbohydrates. We were unable to weigh the patient today for this TeleHealth visit. She feels as if she has maintained weight since her last visit (weight not reported). She has lost 3 lbs since starting treatment with [name].  Vitamin D deficiency Angie Velasquez has a diagnosis of vitamin D deficiency. Angie Velasquez is currently taking vit D and she denies nausea, vomiting or muscle weakness.  ASSESSMENT AND PLAN:  Vitamin D deficiency  Class 2 severe obesity with serious comorbidity and body mass index (BMI) of 37.0 to 37.9 in adult, unspecified obesity type ([place])  PLAN:  Vitamin D Deficiency [name] was informed that low vitamin D levels contributes to fatigue and are associated with obesity, breast, and colon cancer. [name] will continue to take prescription Vit D @50 ,000 IU every week and she will follow up for routine testing of vitamin D, at least 2-3 times per year. She was informed of the risk of over-replacement of vitamin D and agrees to not increase her dose unless she discusses this with [name] first.  Obesity Angie Velasquez is currently in the action stage of change. As such, her goal is to continue with weight loss efforts She has agreed to follow the Category 2 plan Angie Velasquez has been  instructed to work up to a goal of 150 minutes of combined cardio and strengthening exercise per week for weight loss and overall health benefits. We discussed the following Behavioral Modification Strategies today: keeping healthy foods in the home and work on meal planning and easy cooking plans  Angie Velasquez has agreed to follow up with our clinic in 2 weeks. She was informed of the importance of frequent follow up visits to maximize her success with intensive lifestyle modifications for her multiple health conditions.  ALLERGIES: Allergies  Allergen Reactions   Epinephrine Palpitations   Codeine Nausea Only   Penicillins Nausea Only    MEDICATIONS: Current Outpatient Medications on File Prior to Visit  Medication Sig Dispense Refill   acetaminophen (TYLENOL) 500 MG tablet Take 500 mg by mouth every 6 (six) hours as needed.     allopurinol (ZYLOPRIM) 300 MG tablet Take 1 tablet (300 mg total) by mouth 2 (two) times daily. 180 tablet 3   AMBULATORY NON FORMULARY MEDICATION Rolling walker with a seat 1 Units 0   amoxicillin-clavulanate (AUGMENTIN) 875-125 MG tablet Take 1 tablet by mouth 2 (two) times daily. 20 tablet 0   aspirin 81 MG tablet Take 81 mg by mouth daily.     azithromycin (ZITHROMAX) 250 MG tablet Take two tablets on day 1, then one tablet daily x 4 days 6 tablet 0   CALCIUM-MAGNESIUM-ZINC PO Take by mouth.     clonazePAM (KLONOPIN) 0.5 MG tablet TAKE 1 TABLET(0.5 MG) BY MOUTH AT BEDTIME AS NEEDED 90 tablet 1  diazepam (VALIUM) 10 MG tablet Take 10 mg by mouth every 6 (six) hours as needed (as needed for muscle spasms).     diclofenac sodium (VOLTAREN) 1 % GEL Apply topically to affected area qid 100 g 2   gabapentin (NEURONTIN) 300 MG capsule Take 1 capsule, orally, up to TID or as directed 270 capsule 1   HYDROcodone-acetaminophen (NORCO) 5-325 MG tablet Take 1 tablet by mouth every 6 (six) hours as needed. 15 tablet 0   lisinopril (ZESTRIL) 2.5 MG tablet TAKE  1 TABLET EVERY DAY 90 tablet 0   meloxicam (MOBIC) 15 MG tablet TAKE 1 TABLET BY MOUTH DAILY FOR 14 DAYS, THEN AS NEEDED 90 tablet 0   nystatin cream (MYCOSTATIN) APPLY 1 APPLICATION TOPICALLY TO RASH IN GROIN TWICE DAILY FOR UP TO 2 WEEKS MAXIMUM 90 g 2   OVER THE COUNTER MEDICATION Cherry extract bid     OVER THE COUNTER MEDICATION B-12 one daily     OVER THE COUNTER MEDICATION Vitamin D     polyethylene glycol powder (GLYCOLAX/MIRALAX) powder Take 17 g by mouth daily. 3350 g 0   sertraline (ZOLOFT) 50 MG tablet Take 1 tablet (50 mg total) by mouth daily. 90 tablet 3   simvastatin (ZOCOR) 20 MG tablet TAKE 1 TABLET AT BEDTIME 90 tablet 0   Sodium Bicarbonate (NICE PURE BAKING SODA) POWD by Does not apply route.     traMADol (ULTRAM) 50 MG tablet Take 1 tablet (50 mg total) by mouth every 6 (six) hours as needed for moderate pain or severe pain. 60 tablet 1   triamcinolone cream (KENALOG) 0.1 % Apply 1 application topically 2 (two) times daily. For 7-10 days for recurring eczema issues 80 g 1   Vitamin D, Ergocalciferol, (DRISDOL) 1.25 MG (50000 UT) CAPS capsule Take 1 capsule (50,000 Units total) by mouth every 7 (seven) days. 12 capsule 0   No current facility-administered medications on file prior to visit.     PAST MEDICAL HISTORY: Past Medical History:  Diagnosis Date   Anxiety    years ago - no longer an issue   Arthritis    gout   Cataracts, bilateral    Complication of anesthesia    Fracture of pelvis ([facility])    GERD (gastroesophageal reflux disease)    Hx of chest pain    "anxiety"   Hyperlipidemia    INSOMNIA 10/14/2008   in past   Joint pain    Kidney problem    Low blood sugar    eats several smaller meals a day   Obesity    Palpitations    Stress related    Proteinuria    H/O   Tobacco abuse    quit smoking 08/2013    PAST SURGICAL HISTORY: Past Surgical History:  Procedure Laterality Date   ABDOMINAL HYSTERECTOMY      APPENDECTOMY     with hysterectomy   childbirth     x4   CHOLECYSTECTOMY  2001   EYE SURGERY Bilateral 2015   cataract surgery with lens implant   NEPHRECTOMY  1992   complex cystic mass - nephrectomy right   ORIF ANKLE FRACTURE Right 07/02/2014   Procedure: OPEN REDUCTION INTERNAL FIXATION (ORIF) RIGHT ANKLE BIMALLOELAR FRACTURE;  Surgeon: Wylene Simmer, MD;  Location: [facility];  Service: Orthopedics;  Laterality: Right;   OTHER SURGICAL HISTORY     hysterectomy, R ovary remains   SHOULDER SURGERY     bone spurs left    SOCIAL HISTORY: Social  History   Tobacco Use   Smoking status: Former Smoker    Packs/day: 1.00    Years: 35.00    Pack years: 35.00    Types: Cigarettes    Quit date: 09/01/2013    Years since quitting: 6.0   Smokeless tobacco: Never Used   Tobacco comment: Former smoker with no desire to smoke again. Smoking years edited at patients request to include periods when she did not smoke.  Substance Use Topics   Alcohol use: Yes    Alcohol/week: 1.0 standard drinks    Types: 1 Shots of liquor per week   Drug use: No    FAMILY HISTORY: Family History  Problem Relation Age of Onset   Stroke Father    Alzheimer's disease Father        early, states also parkinsons. died 45   Hypertension Father    Obesity Father    Ovarian cancer Mother        Dr. Jana Hakim    Arthritis Mother    Obesity Mother    Colon cancer Paternal Grandmother    Liver disease Daughter        On hospice age 75    ROS: Review of Systems  Constitutional: Negative for weight loss.  Gastrointestinal: Negative for nausea and vomiting.  Musculoskeletal:       Negative for muscle weakness    PHYSICAL EXAM: Pt in no acute distress  RECENT LABS AND TESTS: BMET    Component Value Date/Time   NA 144 08/28/2019 0803   K 3.9 08/28/2019 0803   CL 105 08/28/2019 0803   CO2 22 08/28/2019 0803   GLUCOSE 101 (H) 08/28/2019 0803   GLUCOSE 109 (H) 01/18/2017 0902   BUN  14 08/28/2019 0803   CREATININE 1.14 (H) 08/28/2019 0803   CALCIUM 9.3 08/28/2019 0803   GFRNONAA 48 (L) 08/28/2019 0803   GFRAA 56 (L) 08/28/2019 0803   Lab Results  Component Value Date   HGBA1C 5.8 (H) 08/28/2019   HGBA1C 5.7 (H) 01/09/2019   HGBA1C 5.8 (H) 07/25/2018   HGBA1C 5.7 (H) 09/18/2017   HGBA1C (H) 04/15/2010    5.9 (NOTE)                                                                       According to the ADA Clinical Practice Recommendations for 2011, when HbA1c is used as a screening test:   >=6.5%   Diagnostic of Diabetes Mellitus           (if abnormal result  is confirmed)  5.7-6.4%   Increased risk of developing Diabetes Mellitus  References:Diagnosis and Classification of Diabetes Mellitus,Diabetes [id number] 1):S62-S69 and Standards of Medical Care in         Diabetes - 2011,Diabetes Care,2011,34  (Suppl 1):S11-S61.   Lab Results  Component Value Date   INSULIN 25.2 (H) 08/28/2019   INSULIN 18.6 01/09/2019   INSULIN 22.0 07/25/2018   INSULIN 25.3 (H) 09/18/2017   CBC    Component Value Date/Time   WBC 7.1 08/28/2019 0803   WBC 6.6 01/18/2017 0902   RBC 4.48 08/28/2019 0803   RBC 4.39 01/18/2017 0902   HGB 13.4 08/28/2019 0803   HCT 40.3 08/28/2019 0803   PLT  242 08/28/2019 0803   MCV 90 08/28/2019 0803   MCH 29.9 08/28/2019 0803   MCH 29.4 07/02/2014 1143   MCHC 33.3 08/28/2019 0803   MCHC 33.2 01/18/2017 0902   RDW 14.2 08/28/2019 0803   LYMPHSABS 3.0 08/28/2019 0803   MONOABS 0.9 04/15/2010 2135   EOSABS 0.2 08/28/2019 0803   BASOSABS 0.1 08/28/2019 0803   Iron/TIBC/Ferritin/ %Sat No results found for: IRON, TIBC, FERRITIN, IRONPCTSAT Lipid Panel     Component Value Date/Time   CHOL 183 08/28/2019 0803   TRIG 236 (H) 08/28/2019 0803   HDL 56 08/28/2019 0803   CHOLHDL 2 01/18/2017 0902   VLDL 28.2 01/18/2017 0902   LDLCALC 88 08/28/2019 0803   LDLDIRECT 77.0 01/18/2017 0902   Hepatic Function Panel     Component Value  Date/Time   PROT 6.5 08/28/2019 0803   ALBUMIN 4.2 08/28/2019 0803   AST 25 08/28/2019 0803   ALT 26 08/28/2019 0803   ALKPHOS 117 08/28/2019 0803   BILITOT 0.3 08/28/2019 0803   BILIDIR <0.1 04/15/2010 2135   IBILI NOT CALCULATED 04/15/2010 2135      Component Value Date/Time   TSH 3.430 09/18/2017 1049   TSH 2.09 05/25/2015 0820   TSH 1.89 12/31/2007 0831     Ref. Range 08/28/2019 08:03  Vitamin D, 25-Hydroxy Latest Ref Range: 30.0 - 100.0 ng/mL 42.1    I, Doreene Nest, am acting as Location manager for Abby Potash, PA-C I, Abby Potash, PA-C have reviewed above note and agree with its content

## 2019-09-24 ENCOUNTER — Other Ambulatory Visit: Payer: Self-pay

## 2019-09-24 ENCOUNTER — Telehealth: Payer: Self-pay | Admitting: Family Medicine

## 2019-09-24 MED ORDER — MELOXICAM 15 MG PO TABS: ORAL_TABLET | ORAL | 3 refills | Status: DC

## 2019-09-24 NOTE — Telephone Encounter (Incomplete)
Medication Refill - Medication: meloxicam (MOBIC) 15 MG tablet  traMADol (ULTRAM) 50 MG tablet   Temporary address! Listed below!   Has the patient contacted their pharmacy? Yes.   (Agent: If no, request that the patient contact the pharmacy for the refill.) (Agent: If yes, when and what did the pharmacy advise?)  Preferred Pharmacy (with phone number or street name):  [facility], [address]  [place] [address]  Phone: [phone] Fax: [phone]   *****Address: [address], [place]******    Agent: Please be advised that RX refills may take up to 3 business days. We ask that you follow-up with your pharmacy.

## 2019-09-24 NOTE — Telephone Encounter (Signed)
See below

## 2019-09-25 ENCOUNTER — Encounter: Payer: Self-pay | Admitting: Family Medicine

## 2019-09-25 MED ORDER — TRAMADOL HCL 50 MG PO TABS: 50.0000 mg | ORAL_TABLET | Freq: Four times a day (QID) | ORAL | 0 refills | Status: DC | PRN

## 2019-09-25 NOTE — Telephone Encounter (Signed)
Pt made aware

## 2019-09-29 ENCOUNTER — Encounter (INDEPENDENT_AMBULATORY_CARE_PROVIDER_SITE_OTHER): Payer: Self-pay | Admitting: Physician Assistant

## 2019-09-29 ENCOUNTER — Other Ambulatory Visit: Payer: Self-pay

## 2019-09-29 ENCOUNTER — Ambulatory Visit (INDEPENDENT_AMBULATORY_CARE_PROVIDER_SITE_OTHER): Payer: Medicare HMO | Admitting: Physician Assistant

## 2019-09-29 DIAGNOSIS — Z6830 Body mass index (BMI) 30.0-30.9, adult: Secondary | ICD-10-CM

## 2019-09-29 DIAGNOSIS — E7849 Other hyperlipidemia: Secondary | ICD-10-CM

## 2019-09-29 DIAGNOSIS — E669 Obesity, unspecified: Secondary | ICD-10-CM

## 2019-09-30 ENCOUNTER — Encounter: Payer: Self-pay | Admitting: Family Medicine

## 2019-09-30 NOTE — Progress Notes (Signed)
Office: [phone]  /  Fax: [phone] TeleHealth Visit:  Angie Velasquez has verbally consented to this TeleHealth visit today. The patient is located at home, the provider is located at the [facility] office. The participants in this visit include the listed provider and patient. The visit was conducted today via webex.  HPI:   Chief Complaint: OBESITY Angie Velasquez is here to discuss her progress with her obesity treatment plan. She is on the Category 2 plan and is following her eating plan approximately 40 % of the time. She states she is exercising 0 minutes 0 times per week. Angie Velasquez reports that she has been moving to [place] and has been off the plan. She also thinks that she may be getting a stomach virus.  We were unable to weigh the patient today for this TeleHealth visit. She feels as if she has maintained her weight since her last visit. She has lost 3 lbs since starting treatment with [name].  Hyperlipidemia Angie Velasquez has hyperlipidemia and has been trying to improve her cholesterol levels with intensive lifestyle modification including a low saturated fat diet, exercise and weight loss. She is on simvastatin and denies any chest pain, claudication or myalgias.  ASSESSMENT AND PLAN:  Other hyperlipidemia  Class 1 obesity with serious comorbidity and body mass index (BMI) of 30.0 to 30.9 in adult, unspecified obesity type  PLAN:  Hyperlipidemia [name] was informed of the American Heart Association Guidelines emphasizing intensive lifestyle modifications as the first line treatment for hyperlipidemia. We discussed many lifestyle modifications today in depth, and [name] will continue to work on decreasing saturated fats such as fatty red meat, butter and many fried foods. She will also increase vegetables and lean protein in her diet and continue to work on exercise and weight loss efforts. Angie Velasquez agrees to continue her medications and will follow up with  our clinic as needed.  Obesity Angie Velasquez is currently in the action stage of change. As such, her goal is to continue with weight loss efforts She has agreed to follow the Category 2 plan Angie Velasquez has been instructed to work up to a goal of 150 minutes of combined cardio and strengthening exercise per week for weight loss and overall health benefits. We discussed the following Behavioral Modification Strategies today: work on meal planning and easy cooking plans and no skipping meals   Angie Velasquez has agreed to follow up with our clinic as needed. She was informed of the importance of frequent follow up visits to maximize her success with intensive lifestyle modifications for her multiple health conditions.  ALLERGIES: Allergies  Allergen Reactions  . Epinephrine Palpitations  . Codeine Nausea Only  . Penicillins Nausea Only    MEDICATIONS: Current Outpatient Medications on File Prior to Visit  Medication Sig Dispense Refill  . acetaminophen (TYLENOL) 500 MG tablet Take 500 mg by mouth every 6 (six) hours as needed.    [name] allopurinol (ZYLOPRIM) 300 MG tablet Take 1 tablet (300 mg total) by mouth 2 (two) times daily. 180 tablet 3  . AMBULATORY NON FORMULARY MEDICATION Rolling walker with a seat 1 Units 0  . amoxicillin-clavulanate (AUGMENTIN) 875-125 MG tablet Take 1 tablet by mouth 2 (two) times daily. 20 tablet 0  . aspirin 81 MG tablet Take 81 mg by mouth daily.    [name] azithromycin (ZITHROMAX) 250 MG tablet Take two tablets on day 1, then one tablet daily x 4 days 6 tablet 0  . CALCIUM-MAGNESIUM-ZINC PO Take by mouth.    [name]  clonazePAM (KLONOPIN) 0.5 MG tablet TAKE 1 TABLET(0.5 MG) BY MOUTH AT BEDTIME AS NEEDED 90 tablet 1  . diazepam (VALIUM) 10 MG tablet Take 10 mg by mouth every 6 (six) hours as needed (as needed for muscle spasms).    . diclofenac sodium (VOLTAREN) 1 % GEL Apply topically to affected area qid 100 g 2  . gabapentin (NEURONTIN) 300 MG capsule Take 1 capsule, orally, up to  TID or as directed 270 capsule 1  . HYDROcodone-acetaminophen (NORCO) 5-325 MG tablet Take 1 tablet by mouth every 6 (six) hours as needed. 15 tablet 0  . lisinopril (ZESTRIL) 2.5 MG tablet TAKE 1 TABLET EVERY DAY 90 tablet 0  . meloxicam (MOBIC) 15 MG tablet TAKE 1 TABLET BY MOUTH DAILY FOR 14 DAYS, THEN AS NEEDED 90 tablet 3  . nystatin cream (MYCOSTATIN) APPLY 1 APPLICATION TOPICALLY TO RASH IN GROIN TWICE DAILY FOR UP TO 2 WEEKS MAXIMUM 90 g 2  . OVER THE COUNTER MEDICATION Cherry extract bid    . OVER THE COUNTER MEDICATION B-12 one daily    . OVER THE COUNTER MEDICATION Vitamin D    . polyethylene glycol powder (GLYCOLAX/MIRALAX) powder Take 17 g by mouth daily. 3350 g 0  . sertraline (ZOLOFT) 50 MG tablet Take 1 tablet (50 mg total) by mouth daily. 90 tablet 3  . simvastatin (ZOCOR) 20 MG tablet TAKE 1 TABLET AT BEDTIME 90 tablet 0  . Sodium Bicarbonate (NICE PURE BAKING SODA) POWD by Does not apply route.    . traMADol (ULTRAM) 50 MG tablet Take 1 tablet (50 mg total) by mouth every 6 (six) hours as needed for moderate pain or severe pain. 60 tablet 0  . triamcinolone cream (KENALOG) 0.1 % Apply 1 application topically 2 (two) times daily. For 7-10 days for recurring eczema issues 80 g 1  . Vitamin D, Ergocalciferol, (DRISDOL) 1.25 MG (50000 UT) CAPS capsule Take 1 capsule (50,000 Units total) by mouth every 7 (seven) days. 12 capsule 0   No current facility-administered medications on file prior to visit.     PAST MEDICAL HISTORY: Past Medical History:  Diagnosis Date  . Anxiety    years ago - no longer an issue  . Arthritis    gout  . Cataracts, bilateral   . Complication of anesthesia   . Fracture of pelvis ([facility])   . GERD (gastroesophageal reflux disease)   . Hx of chest pain    "anxiety"  . Hyperlipidemia   . INSOMNIA 10/14/2008   in past  . Joint pain   . Kidney problem   . Low blood sugar    eats several smaller meals a day  . Obesity   . Palpitations    Stress  related   . Proteinuria    H/O  . Tobacco abuse    quit smoking 08/2013    PAST SURGICAL HISTORY: Past Surgical History:  Procedure Laterality Date  . ABDOMINAL HYSTERECTOMY    . APPENDECTOMY     with hysterectomy  . childbirth     x4  . CHOLECYSTECTOMY  2001  . EYE SURGERY Bilateral 2015   cataract surgery with lens implant  . NEPHRECTOMY  1992   complex cystic mass - nephrectomy right  . ORIF ANKLE FRACTURE Right 07/02/2014   Procedure: OPEN REDUCTION INTERNAL FIXATION (ORIF) RIGHT ANKLE BIMALLOELAR FRACTURE;  Surgeon: Wylene Simmer, MD;  Location: [facility];  Service: Orthopedics;  Laterality: Right;  . OTHER SURGICAL HISTORY     hysterectomy,  R ovary remains  . SHOULDER SURGERY     bone spurs left    SOCIAL HISTORY: Social History   Tobacco Use  . Smoking status: Former Smoker    Packs/day: 1.00    Years: 35.00    Pack years: 35.00    Types: Cigarettes    Quit date: 09/01/2013    Years since quitting: 6.0  . Smokeless tobacco: Never Used  . Tobacco comment: Former smoker with no desire to smoke again. Smoking years edited at patients request to include periods when she did not smoke.  Substance Use Topics  . Alcohol use: Yes    Alcohol/week: 1.0 standard drinks    Types: 1 Shots of liquor per week  . Drug use: No    FAMILY HISTORY: Family History  Problem Relation Age of Onset  . Stroke Father   . Alzheimer's disease Father        early, states also parkinsons. died 24  . Hypertension Father   . Obesity Father   . Ovarian cancer Mother        Dr. Jana Hakim   . Arthritis Mother   . Obesity Mother   . Colon cancer Paternal Grandmother   . Liver disease Daughter        On hospice age 37    ROS: Review of Systems  Constitutional: Negative for weight loss.  Cardiovascular: Negative for chest pain and claudication.  Musculoskeletal: Negative for myalgias.    PHYSICAL EXAM: Pt in no acute distress  RECENT LABS AND TESTS: BMET    Component Value  Date/Time   NA 144 08/28/2019 0803   K 3.9 08/28/2019 0803   CL 105 08/28/2019 0803   CO2 22 08/28/2019 0803   GLUCOSE 101 (H) 08/28/2019 0803   GLUCOSE 109 (H) 01/18/2017 0902   BUN 14 08/28/2019 0803   CREATININE 1.14 (H) 08/28/2019 0803   CALCIUM 9.3 08/28/2019 0803   GFRNONAA 48 (L) 08/28/2019 0803   GFRAA 56 (L) 08/28/2019 0803   Lab Results  Component Value Date   HGBA1C 5.8 (H) 08/28/2019   HGBA1C 5.7 (H) 01/09/2019   HGBA1C 5.8 (H) 07/25/2018   HGBA1C 5.7 (H) 09/18/2017   HGBA1C (H) 04/15/2010    5.9 (NOTE)                                                                       According to the ADA Clinical Practice Recommendations for 2011, when HbA1c is used as a screening test:   >=6.5%   Diagnostic of Diabetes Mellitus           (if abnormal result  is confirmed)  5.7-6.4%   Increased risk of developing Diabetes Mellitus  References:Diagnosis and Classification of Diabetes Mellitus,Diabetes [id number] 1):S62-S69 and Standards of Medical Care in         Diabetes - 2011,Diabetes Care,2011,34  (Suppl 1):S11-S61.   Lab Results  Component Value Date   INSULIN 25.2 (H) 08/28/2019   INSULIN 18.6 01/09/2019   INSULIN 22.0 07/25/2018   INSULIN 25.3 (H) 09/18/2017   CBC    Component Value Date/Time   WBC 7.1 08/28/2019 0803   WBC 6.6 01/18/2017 0902   RBC 4.48 08/28/2019 0803   RBC 4.39 01/18/2017 0902  HGB 13.4 08/28/2019 0803   HCT 40.3 08/28/2019 0803   PLT 242 08/28/2019 0803   MCV 90 08/28/2019 0803   MCH 29.9 08/28/2019 0803   MCH 29.4 07/02/2014 1143   MCHC 33.3 08/28/2019 0803   MCHC 33.2 01/18/2017 0902   RDW 14.2 08/28/2019 0803   LYMPHSABS 3.0 08/28/2019 0803   MONOABS 0.9 04/15/2010 2135   EOSABS 0.2 08/28/2019 0803   BASOSABS 0.1 08/28/2019 0803   Iron/TIBC/Ferritin/ %Sat No results found for: IRON, TIBC, FERRITIN, IRONPCTSAT Lipid Panel     Component Value Date/Time   CHOL 183 08/28/2019 0803   TRIG 236 (H) 08/28/2019 0803   HDL 56  08/28/2019 0803   CHOLHDL 2 01/18/2017 0902   VLDL 28.2 01/18/2017 0902   LDLCALC 88 08/28/2019 0803   LDLDIRECT 77.0 01/18/2017 0902   Hepatic Function Panel     Component Value Date/Time   PROT 6.5 08/28/2019 0803   ALBUMIN 4.2 08/28/2019 0803   AST 25 08/28/2019 0803   ALT 26 08/28/2019 0803   ALKPHOS 117 08/28/2019 0803   BILITOT 0.3 08/28/2019 0803   BILIDIR <0.1 04/15/2010 2135   IBILI NOT CALCULATED 04/15/2010 2135      Component Value Date/Time   TSH 3.430 09/18/2017 1049   TSH 2.09 05/25/2015 0820   TSH 1.89 12/31/2007 0831      I, Trixie Dredge, am acting as transcriptionist for Abby Potash, PA-C I, Abby Potash, PA-C have reviewed above note and agree with its content

## 2019-09-30 NOTE — Telephone Encounter (Signed)
Spoke to pt told her calling in regards to her My chart message. Told her Dr. Yong Channel does not have any appointments available. I suggest you go to an Urgent Care to be evaluated. Pt said she is in [place]. Kept getting disconnected, then left detailed message on phone. Left message can try the phenergan and continue drinking sips and flat ginger ale, eat bland diet. If symptoms continue or anymore vomiting would recommend you go to a Urgent Care to be evaluated. Any questions please call back.

## 2019-10-04 ENCOUNTER — Other Ambulatory Visit: Payer: Self-pay | Admitting: Family Medicine

## 2019-10-10 DIAGNOSIS — M5416 Radiculopathy, lumbar region: Secondary | ICD-10-CM | POA: Diagnosis not present

## 2019-10-13 ENCOUNTER — Telehealth (INDEPENDENT_AMBULATORY_CARE_PROVIDER_SITE_OTHER): Payer: Medicare HMO | Admitting: Physician Assistant

## 2019-10-13 ENCOUNTER — Other Ambulatory Visit: Payer: Self-pay

## 2019-10-13 ENCOUNTER — Encounter (INDEPENDENT_AMBULATORY_CARE_PROVIDER_SITE_OTHER): Payer: Self-pay | Admitting: Physician Assistant

## 2019-10-13 DIAGNOSIS — E559 Vitamin D deficiency, unspecified: Secondary | ICD-10-CM | POA: Diagnosis not present

## 2019-10-13 DIAGNOSIS — Z6837 Body mass index (BMI) 37.0-37.9, adult: Secondary | ICD-10-CM

## 2019-10-14 NOTE — Progress Notes (Signed)
Office: [phone]  /  Fax: [phone] TeleHealth Visit:  Angie Velasquez has verbally consented to this TeleHealth visit today. The patient is located at [place], the provider is located at the [facility] office. The participants in this visit include the listed provider and patient and any and all parties involved. The visit was conducted today via telephone. Saige was unable to use realtime audiovisual technology today and the telehealth visit was conducted via telephone (time spent on call 15 minutes).  HPI:   Chief Complaint: OBESITY Angie Velasquez is here to discuss her progress with her obesity treatment plan. She is on the Category 2 plan and is following her eating plan approximately 50 % of the time. She states she is exercising 0 minutes 0 times per week. Angie Velasquez's most recent weight was 246 pounds (date not reported). She has been on a prednisone pack, due to pain from a newly diagnosed herniated disc. We were unable to weigh the patient today for this TeleHealth visit. She feels as if she has maintained weight since her last visit. She has lost 3 lbs since starting treatment with [name].  Vitamin D deficiency Angie Velasquez has a diagnosis of vitamin D deficiency. Angie Velasquez is currently taking vit D and she denies nausea, vomiting or muscle weakness.  ASSESSMENT AND PLAN:  Vitamin D deficiency  Class 2 severe obesity with serious comorbidity and body mass index (BMI) of 37.0 to 37.9 in adult, unspecified obesity type ([place])  PLAN:  Vitamin D Deficiency Angie Velasquez was informed that low vitamin D levels contributes to fatigue and are associated with obesity, breast, and colon cancer. Edrina will continue to take prescription Vit D @50 ,000 IU every week and she will follow up for routine testing of vitamin D, at least 2-3 times per year. She was informed of the risk of over-replacement of vitamin D and agrees to not increase her dose unless she discusses this with [name]  first.  Obesity Angie Velasquez is currently in the action stage of change. As such, her goal is to continue with weight loss efforts She has agreed to follow the Category 2 plan Angie Velasquez has been instructed to work up to a goal of 150 minutes of combined cardio and strengthening exercise per week for weight loss and overall health benefits. We discussed the following Behavioral Modification Strategies today: keeping healthy foods in the home and work on meal planning and easy cooking plans  Angie Velasquez has agreed to follow up with our clinic in 3 weeks. She was informed of the importance of frequent follow up visits to maximize her success with intensive lifestyle modifications for her multiple health conditions.  ALLERGIES: Allergies  Allergen Reactions   Epinephrine Palpitations   Codeine Nausea Only   Penicillins Nausea Only    MEDICATIONS: Current Outpatient Medications on File Prior to Visit  Medication Sig Dispense Refill   acetaminophen (TYLENOL) 500 MG tablet Take 500 mg by mouth every 6 (six) hours as needed.     allopurinol (ZYLOPRIM) 300 MG tablet Take 1 tablet (300 mg total) by mouth 2 (two) times daily. 180 tablet 3   AMBULATORY NON FORMULARY MEDICATION Rolling walker with a seat 1 Units 0   amoxicillin-clavulanate (AUGMENTIN) 875-125 MG tablet Take 1 tablet by mouth 2 (two) times daily. 20 tablet 0   aspirin 81 MG tablet Take 81 mg by mouth daily.     azithromycin (ZITHROMAX) 250 MG tablet Take two tablets on day 1, then one tablet daily x 4 days 6 tablet  0   CALCIUM-MAGNESIUM-ZINC PO Take by mouth.     clonazePAM (KLONOPIN) 0.5 MG tablet TAKE 1 TABLET(0.5 MG) BY MOUTH AT BEDTIME AS NEEDED 90 tablet 1   diazepam (VALIUM) 10 MG tablet Take 10 mg by mouth every 6 (six) hours as needed (as needed for muscle spasms).     diclofenac sodium (VOLTAREN) 1 % GEL Apply topically to affected area qid 100 g 2   gabapentin (NEURONTIN) 300 MG capsule Take 1 capsule, orally, up to  TID or as directed 270 capsule 1   HYDROcodone-acetaminophen (NORCO) 5-325 MG tablet Take 1 tablet by mouth every 6 (six) hours as needed. 15 tablet 0   lisinopril (ZESTRIL) 2.5 MG tablet TAKE 1 TABLET EVERY DAY 90 tablet 0   meloxicam (MOBIC) 15 MG tablet TAKE 1 TABLET BY MOUTH DAILY FOR 14 DAYS, THEN AS NEEDED 90 tablet 3   nystatin cream (MYCOSTATIN) APPLY 1 APPLICATION TOPICALLY TO RASH IN GROIN TWICE DAILY FOR UP TO 2 WEEKS MAXIMUM 90 g 2   OVER THE COUNTER MEDICATION Cherry extract bid     OVER THE COUNTER MEDICATION B-12 one daily     OVER THE COUNTER MEDICATION Vitamin D     polyethylene glycol powder (GLYCOLAX/MIRALAX) powder Take 17 g by mouth daily. 3350 g 0   sertraline (ZOLOFT) 50 MG tablet Take 1 tablet (50 mg total) by mouth daily. 90 tablet 3   simvastatin (ZOCOR) 20 MG tablet TAKE 1 TABLET AT BEDTIME 90 tablet 0   Sodium Bicarbonate (NICE PURE BAKING SODA) POWD by Does not apply route.     traMADol (ULTRAM) 50 MG tablet Take 1 tablet (50 mg total) by mouth every 6 (six) hours as needed for moderate pain or severe pain. 60 tablet 0   triamcinolone cream (KENALOG) 0.1 % Apply 1 application topically 2 (two) times daily. For 7-10 days for recurring eczema issues 80 g 1   Vitamin D, Ergocalciferol, (DRISDOL) 1.25 MG (50000 UT) CAPS capsule Take 1 capsule (50,000 Units total) by mouth every 7 (seven) days. 12 capsule 0   No current facility-administered medications on file prior to visit.     PAST MEDICAL HISTORY: Past Medical History:  Diagnosis Date   Anxiety    years ago - no longer an issue   Arthritis    gout   Cataracts, bilateral    Complication of anesthesia    Fracture of pelvis (HCC)    GERD (gastroesophageal reflux disease)    Hx of chest pain    "anxiety"   Hyperlipidemia    INSOMNIA 10/14/2008   in past   Joint pain    Kidney problem    Low blood sugar    eats several smaller meals a day   Obesity    Palpitations    Stress  related    Proteinuria    H/O   Tobacco abuse    quit smoking 08/2013    PAST SURGICAL HISTORY: Past Surgical History:  Procedure Laterality Date   ABDOMINAL HYSTERECTOMY     APPENDECTOMY     with hysterectomy   childbirth     x4   CHOLECYSTECTOMY  2001   EYE SURGERY Bilateral 2015   cataract surgery with lens implant   NEPHRECTOMY  1992   complex cystic mass - nephrectomy right   ORIF ANKLE FRACTURE Right 07/02/2014   Procedure: OPEN REDUCTION INTERNAL FIXATION (ORIF) RIGHT ANKLE BIMALLOELAR FRACTURE;  Surgeon: Wylene Simmer, MD;  Location: [name];  Service: Orthopedics;  Laterality: Right;   OTHER SURGICAL HISTORY     hysterectomy, R ovary remains   SHOULDER SURGERY     bone spurs left    SOCIAL HISTORY: Social History   Tobacco Use   Smoking status: Former Smoker    Packs/day: 1.00    Years: 35.00    Pack years: 35.00    Types: Cigarettes    Quit date: 09/01/2013    Years since quitting: 6.1   Smokeless tobacco: Never Used   Tobacco comment: Former smoker with no desire to smoke again. Smoking years edited at patients request to include periods when she did not smoke.  Substance Use Topics   Alcohol use: Yes    Alcohol/week: 1.0 standard drinks    Types: 1 Shots of liquor per week   Drug use: No    FAMILY HISTORY: Family History  Problem Relation Age of Onset   Stroke Father    Alzheimer's disease Father        early, states also parkinsons. died 20   Hypertension Father    Obesity Father    Ovarian cancer Mother        Dr. Jana Hakim    Arthritis Mother    Obesity Mother    Colon cancer Paternal Grandmother    Liver disease Daughter        On hospice age 72    ROS: Review of Systems  Constitutional: Negative for weight loss.  Gastrointestinal: Negative for nausea and vomiting.  Musculoskeletal:       Negative for muscle weakness    PHYSICAL EXAM: Pt in no acute distress  RECENT LABS AND TESTS: BMET    Component Value  Date/Time   NA 144 08/28/2019 0803   K 3.9 08/28/2019 0803   CL 105 08/28/2019 0803   CO2 22 08/28/2019 0803   GLUCOSE 101 (H) 08/28/2019 0803   GLUCOSE 109 (H) 01/18/2017 0902   BUN 14 08/28/2019 0803   CREATININE 1.14 (H) 08/28/2019 0803   CALCIUM 9.3 08/28/2019 0803   GFRNONAA 48 (L) 08/28/2019 0803   GFRAA 56 (L) 08/28/2019 0803   Lab Results  Component Value Date   HGBA1C 5.8 (H) 08/28/2019   HGBA1C 5.7 (H) 01/09/2019   HGBA1C 5.8 (H) 07/25/2018   HGBA1C 5.7 (H) 09/18/2017   HGBA1C (H) 04/15/2010    5.9 (NOTE)                                                                       According to the ADA Clinical Practice Recommendations for 2011, when HbA1c is used as a screening test:   >=6.5%   Diagnostic of Diabetes Mellitus           (if abnormal result  is confirmed)  5.7-6.4%   Increased risk of developing Diabetes Mellitus  References:Diagnosis and Classification of Diabetes Mellitus,Diabetes [id number] 1):S62-S69 and Standards of Medical Care in         Diabetes - 2011,Diabetes Care,2011,34  (Suppl 1):S11-S61.   Lab Results  Component Value Date   INSULIN 25.2 (H) 08/28/2019   INSULIN 18.6 01/09/2019   INSULIN 22.0 07/25/2018   INSULIN 25.3 (H) 09/18/2017   CBC    Component Value Date/Time   WBC 7.1 08/28/2019 0803  WBC 6.6 01/18/2017 0902   RBC 4.48 08/28/2019 0803   RBC 4.39 01/18/2017 0902   HGB 13.4 08/28/2019 0803   HCT 40.3 08/28/2019 0803   PLT 242 08/28/2019 0803   MCV 90 08/28/2019 0803   MCH 29.9 08/28/2019 0803   MCH 29.4 07/02/2014 1143   MCHC 33.3 08/28/2019 0803   MCHC 33.2 01/18/2017 0902   RDW 14.2 08/28/2019 0803   LYMPHSABS 3.0 08/28/2019 0803   MONOABS 0.9 04/15/2010 2135   EOSABS 0.2 08/28/2019 0803   BASOSABS 0.1 08/28/2019 0803   Iron/TIBC/Ferritin/ %Sat No results found for: IRON, TIBC, FERRITIN, IRONPCTSAT Lipid Panel     Component Value Date/Time   CHOL 183 08/28/2019 0803   TRIG 236 (H) 08/28/2019 0803   HDL 56  08/28/2019 0803   CHOLHDL 2 01/18/2017 0902   VLDL 28.2 01/18/2017 0902   LDLCALC 88 08/28/2019 0803   LDLDIRECT 77.0 01/18/2017 0902   Hepatic Function Panel     Component Value Date/Time   PROT 6.5 08/28/2019 0803   ALBUMIN 4.2 08/28/2019 0803   AST 25 08/28/2019 0803   ALT 26 08/28/2019 0803   ALKPHOS 117 08/28/2019 0803   BILITOT 0.3 08/28/2019 0803   BILIDIR <0.1 04/15/2010 2135   IBILI NOT CALCULATED 04/15/2010 2135      Component Value Date/Time   TSH 3.430 09/18/2017 1049   TSH 2.09 05/25/2015 0820   TSH 1.89 12/31/2007 0831     Ref. Range 08/28/2019 08:03  Vitamin D, 25-Hydroxy Latest Ref Range: 30.0 - 100.0 ng/mL 42.1    I, Angie Velasquez, am acting as Location manager for Angie Potash, PA-C I, Angie Potash, PA-C have reviewed above note and agree with its content

## 2019-10-17 ENCOUNTER — Ambulatory Visit: Payer: Self-pay | Admitting: *Deleted

## 2019-10-17 NOTE — Telephone Encounter (Signed)
FYI.  Pt advised to go to ED due to chest pain and L arm pain x 2 days.

## 2019-10-17 NOTE — Telephone Encounter (Signed)
Pt called with complaints of intermtittent chest pain and left arm pain that started 2 days ago; the pt also says that;she has a bulging disc,and was placed  on prednisone by an orthopedic MD in [place], but stopped taking it because she thought that she was having a reaction to it; she says the pain in her arm wakes her up; the pt is currently in [place]; pt advised to proceed to the ED for evaluation; she verbalized understanding; she is seen by Dr Yong Channel, [place]; will route to office for notification

## 2019-10-23 DIAGNOSIS — G5622 Lesion of ulnar nerve, left upper limb: Secondary | ICD-10-CM | POA: Diagnosis not present

## 2019-10-23 DIAGNOSIS — M79671 Pain in right foot: Secondary | ICD-10-CM | POA: Diagnosis not present

## 2019-10-23 DIAGNOSIS — M5416 Radiculopathy, lumbar region: Secondary | ICD-10-CM | POA: Diagnosis not present

## 2019-10-23 DIAGNOSIS — L84 Corns and callosities: Secondary | ICD-10-CM | POA: Diagnosis not present

## 2019-10-23 DIAGNOSIS — M7741 Metatarsalgia, right foot: Secondary | ICD-10-CM | POA: Diagnosis not present

## 2019-11-03 ENCOUNTER — Ambulatory Visit (INDEPENDENT_AMBULATORY_CARE_PROVIDER_SITE_OTHER): Payer: Medicare HMO | Admitting: Family Medicine

## 2019-11-03 ENCOUNTER — Ambulatory Visit: Payer: Self-pay | Admitting: *Deleted

## 2019-11-03 ENCOUNTER — Encounter: Payer: Self-pay | Admitting: Family Medicine

## 2019-11-03 DIAGNOSIS — J029 Acute pharyngitis, unspecified: Secondary | ICD-10-CM | POA: Diagnosis not present

## 2019-11-03 DIAGNOSIS — T63301A Toxic effect of unspecified spider venom, accidental (unintentional), initial encounter: Secondary | ICD-10-CM

## 2019-11-03 NOTE — Progress Notes (Signed)
Virtual Visit via Video Note  Subjective  CC:  Chief Complaint  Patient presents with  . Sore Throat     I connected with Angie Velasquez on 11/03/19 at  4:20 PM EST by a video enabled telemedicine application and verified that I am speaking with the correct person using two identifiers. Location patient: Home Location provider: [facility], Office Persons participating in the virtual visit: Melanee Left, MD Janeann Forehand, RN  Same day acute visit; PCP not available. New pt to me. Chart reviewed.   I discussed the limitations of evaluation and management by telemedicine and the availability of in person appointments. The patient expressed understanding and agreed to proceed. HPI: Angie Velasquez is a 73 y.o. female who was contacted today to address the problems listed above in the chief complaint. . 73 yo female c/o 2 days of left sided ST radiating to ear. No fever or cold sxs. No mylagias, loss of taste nor smell. Returned home 2 days ago from [place] where she has been for 6 weeks. Denies known exposures to sick contacts. Admits to worse sxs in am, PND and some sneezing. No chronic allergy problems but did get allergy sxs last spring. Had similar presentation earlier this year and was treated empirically with a zpak. No n/v/d. Doesn't feel sick. Using tylenol and lozenges. Avoids nsaids due to h/o nephrectomy. Pain is not severe. She is eating and drinking well.  [name] Spider bite left forearm. 2 weeks ago. Still tender but improving. No redness, heat or drainage.   Assessment  1. Sore throat   2. Spider bite wound, accidental or unintentional, initial encounter      Plan   ST:  Viral vs related to PND and allergies. Discussed with patient. Recommend gargle with ASA, tylenol and adding claritin. F/u if FEVER, or worsening sxs develop. No need for covid testing now.   Spider bite: no signs of active  infection. Supportive care.  I discussed the assessment and treatment plan with the patient. The patient was provided an opportunity to ask questions and all were answered. The patient agreed with the plan and demonstrated an understanding of the instructions.   The patient was advised to call back or seek an in-person evaluation if the symptoms worsen or if the condition fails to improve as anticipated. Follow up: prn  Visit date not found  No orders of the defined types were placed in this encounter.     I reviewed the patients updated PMH, FH, and SocHx.    Patient Active Problem List   Diagnosis Date Noted  . CKD (chronic kidney disease), stage III 02/14/2019  . Osteitis condensans 06/25/2018  . Osteoarthritis of spine with radiculopathy, cervical region 02/12/2018  . Intertrigo 01/10/2018  . Pes anserinus bursitis of left knee 01/02/2018  . Osteitis pubis ([place]) 12/19/2017  . Prediabetes 10/22/2017  . Other fatigue 09/18/2017  . Shortness of breath on exertion 09/18/2017  . Vitamin D deficiency 09/18/2017  . Hyperglycemia 09/18/2017  . Class 2 severe obesity with serious comorbidity and body mass index (BMI) of 35.0 to 35.9 in adult ([place]) 06/13/2017  . Atypical chest pain 03/26/2017  . Depression 12/19/2016  . Anxiety state 07/20/2016  . Essential hypertension 01/04/2016  . Eczema 06/29/2015  . Gout 03/12/2015  . S/p nephrectomy 03/12/2015  . Hyperlipidemia 03/12/2015  . Tinnitus 03/12/2015  . COPD (chronic obstructive pulmonary disease) ([place]) 03/12/2015  . History  of colonic polyps 03/12/2015  . Bimalleolar fracture 07/02/2014  . Arthritis   . Former smoker   . GERD 01/23/2008  . MICROALBUMINURIA 01/23/2008  . Microscopic hematuria 01/23/2008  . UTI (urinary tract infection) 01/08/2008   Current Meds  Medication Sig  . allopurinol (ZYLOPRIM) 300 MG tablet Take 1 tablet (300 mg total) by mouth 2 (two) times daily.  [name] aspirin 81 MG tablet Take 81 mg by mouth daily.   [name] CALCIUM-MAGNESIUM-ZINC PO Take by mouth.  . clonazePAM (KLONOPIN) 0.5 MG tablet TAKE 1 TABLET(0.5 MG) BY MOUTH AT BEDTIME AS NEEDED  . diazepam (VALIUM) 10 MG tablet Take 10 mg by mouth every 6 (six) hours as needed (as needed for muscle spasms).  . diclofenac sodium (VOLTAREN) 1 % GEL Apply topically to affected area qid  . gabapentin (NEURONTIN) 300 MG capsule Take 1 capsule, orally, up to TID or as directed  . HYDROcodone-acetaminophen (NORCO) 5-325 MG tablet Take 1 tablet by mouth every 6 (six) hours as needed.  [name] lisinopril (ZESTRIL) 2.5 MG tablet TAKE 1 TABLET EVERY DAY  . meloxicam (MOBIC) 15 MG tablet TAKE 1 TABLET BY MOUTH DAILY FOR 14 DAYS, THEN AS NEEDED  . nystatin cream (MYCOSTATIN) APPLY 1 APPLICATION TOPICALLY TO RASH IN GROIN TWICE DAILY FOR UP TO 2 WEEKS MAXIMUM  . sertraline (ZOLOFT) 50 MG tablet Take 1 tablet (50 mg total) by mouth daily.  . simvastatin (ZOCOR) 20 MG tablet TAKE 1 TABLET AT BEDTIME  . Sodium Bicarbonate (NICE PURE BAKING SODA) POWD by Does not apply route.  . traMADol (ULTRAM) 50 MG tablet Take 1 tablet (50 mg total) by mouth every 6 (six) hours as needed for moderate pain or severe pain.  [name] triamcinolone cream (KENALOG) 0.1 % Apply 1 application topically 2 (two) times daily. For 7-10 days for recurring eczema issues  . Vitamin D, Ergocalciferol, (DRISDOL) 1.25 MG (50000 UT) CAPS capsule Take 1 capsule (50,000 Units total) by mouth every 7 (seven) days.    Allergies: Patient is allergic to epinephrine; codeine; penicillins; and prednisone. Family History: Patient family history includes Alzheimer's disease in her father; Arthritis in her mother; Colon cancer in her paternal grandmother; Hypertension in her father; Liver disease in her daughter; Obesity in her father and mother; Ovarian cancer in her mother; Stroke in her father. Social History:  Patient  reports that she quit smoking about 6 years ago. Her smoking use included cigarettes. She has a 35.00  pack-year smoking history. She has never used smokeless tobacco. She reports current alcohol use of about 1.0 standard drinks of alcohol per week. She reports that she does not use drugs.  Review of Systems: Constitutional: Negative for fever malaise or anorexia Cardiovascular: negative for chest pain Respiratory: negative for SOB or persistent cough Gastrointestinal: negative for abdominal pain  OBJECTIVE Vitals: There were no vitals taken for this visit. General: no acute distress , A&Ox3 Left forearm: 2 scabs present; cannot visualize much else due to poor picture quality.   Leamon Arnt, MD

## 2019-11-03 NOTE — Telephone Encounter (Signed)
Pt called in c/o a very sore throat.  No other symptoms.  "I feel pretty good".   Just got back from [place].    COVID test has been negative both times tested.  I went over the care advice and she told me I'm doing all of that.   "I raised 3 boys so I pretty much remember what to do".  I warm transferred her call into Dr. Ansel Bong office to be scheduled.  She was agreeable to a virtual visit, actually preferred a virtual.      Reason for Disposition . [1] SEVERE throat pain (interferes with function) AND [2] not improved after 2 hours of ibuprofen AND [3] drinking adequately  Answer Assessment - Initial Assessment Questions 1. ONSET: "When did the throat start hurting?" (Hours or days ago)      I just got back from [place]. Hurting for several days.   It's really sore today.   No fever. 2. SEVERITY: "How bad is the sore throat?"     * MILD: doesn't interfere with eating or normal activities    * MODERATE: interferes with eating some solids and normal activities    * SEVERE PAIN: excruciating pain, interferes with most normal activities    * SEVERE DYSPHAGIA: can't swallow liquids, drooling     It's hard to swallow.   I don't feel bad.    I've tests X2 were negative for COVID. 3. STREP EXPOSURE: "Has there been any exposure to strep within the past week?" If so, ask: "What type of contact occurred?"      No 4. VIRAL SYMPTOMS: "Are there any symptoms of a cold, such as a runny nose, cough, hoarse voice/cry or red eyes?"      Sore throat 5. FEVER: "Does your child have a fever?" If so, ask: "What is it?", "How was it measured?" and "When did it start?"      No fever 6. PUS ON THE TONSILS: Only ask about this if the caller has already told you that they've looked at the throat.      Don't know 7. CHILD'S APPEARANCE: "How sick is your child acting?" " What is he doing right now?" If asleep, ask: "How was he acting before he went to sleep?"     N/A  Protocols used: SORE THROAT-P-AH

## 2019-11-04 ENCOUNTER — Ambulatory Visit (INDEPENDENT_AMBULATORY_CARE_PROVIDER_SITE_OTHER): Payer: Medicare HMO | Admitting: Physician Assistant

## 2019-11-04 ENCOUNTER — Other Ambulatory Visit: Payer: Self-pay

## 2019-11-04 ENCOUNTER — Encounter (INDEPENDENT_AMBULATORY_CARE_PROVIDER_SITE_OTHER): Payer: Self-pay | Admitting: Physician Assistant

## 2019-11-04 DIAGNOSIS — E7849 Other hyperlipidemia: Secondary | ICD-10-CM

## 2019-11-04 DIAGNOSIS — E559 Vitamin D deficiency, unspecified: Secondary | ICD-10-CM

## 2019-11-04 DIAGNOSIS — Z6835 Body mass index (BMI) 35.0-35.9, adult: Secondary | ICD-10-CM | POA: Diagnosis not present

## 2019-11-05 ENCOUNTER — Telehealth: Payer: Self-pay | Admitting: Family Medicine

## 2019-11-05 MED ORDER — DEXAMETHASONE 0.5 MG/5ML PO ELIX: ORAL_SOLUTION | ORAL | 0 refills | Status: DC

## 2019-11-05 NOTE — Telephone Encounter (Signed)
Copied from [place] [id number]. Topic: General - Other >> Nov 05, 2019  7:04 AM Lennox Solders wrote: Reason for CRM: pt had virtual appt with dr Jonni Sanger on 11-03-2019. Pt still has sore throat and a painful fever blister under her tongue. Pt has been eating popsicles trying to relief sore throat and fever blister. [facility]. Please advise

## 2019-11-05 NOTE — Telephone Encounter (Signed)
Called and spoke with Pt and she states she does not have covid, she has been tested twice in the past 6 weeks and both negative results. She states she is not going to an urgent care either. She just wants to know what to do for the canker sore and the sore throat she stated Dr. Jonni Sanger told her was from post nasal drip. She has been doing salt water rinses and magnesia rinses for the canker sore with no relief. She wants to know what else to do for the canker sore.

## 2019-11-05 NOTE — Telephone Encounter (Signed)
See request °

## 2019-11-05 NOTE — Telephone Encounter (Signed)
Pt is calling and needs refill on tramadol . [facility]

## 2019-11-05 NOTE — Telephone Encounter (Signed)
Would you like to evaluate in person if pt agrees or another virtual given the sore throat?

## 2019-11-05 NOTE — Telephone Encounter (Signed)
Also  pt has been using ice pack

## 2019-11-05 NOTE — Telephone Encounter (Signed)
Get her tested for covid- if negative we can see her in office.   If she prefers could also go to urgent care as they have adequate PPE to evaluate patients with sore throats.

## 2019-11-05 NOTE — Progress Notes (Signed)
Office: [phone]  /  Fax: [phone] TeleHealth Visit:  Angie Velasquez has verbally consented to this TeleHealth visit today. The patient is located at home, the provider is located at the [facility] office. The participants in this visit include the listed provider and patient and any and all parties involved. The visit was conducted today via telephone. Angie Velasquez was unable to use realtime audiovisual technology today and the telehealth visit was conducted via telephone (time spent on call was 15 minutes).  HPI:   Chief Complaint: OBESITY Angie Velasquez is here to discuss her progress with her obesity treatment plan. She is on the Category 2 plan and is following her eating plan approximately 50 % of the time. She states she is exercising 0 minutes 0 times per week. Angie Velasquez reports that she has been eating more yogurt and cottage cheese. She is also using [employer] shakes for snacks. We were unable to weigh the patient today for this TeleHealth visit. She feels as if she has lost weight since her last visit (weight not reported). She has lost 3 lbs since starting treatment with [name].  Vitamin D deficiency Angie Velasquez has a diagnosis of vitamin D deficiency. Angie Velasquez is currently taking vit D and she denies nausea, vomiting or muscle weakness.  Hyperlipidemia Angie Velasquez has hyperlipidemia and she is not on medications. She has been trying to improve her cholesterol levels with intensive lifestyle modification including a low saturated fat diet, exercise and weight loss. She denies any chest pain.  ASSESSMENT AND PLAN:  No diagnosis found.  PLAN:  Vitamin D Deficiency Angie Velasquez was informed that low vitamin D levels contributes to fatigue and are associated with obesity, breast, and colon cancer. Angie Velasquez agrees to continue to take prescription Vit D @50 ,000 IU every week #4 with no refills and she will follow up for routine testing of vitamin D, at least 2-3 times per year. She  was informed of the risk of over-replacement of vitamin D and agrees to not increase her dose unless she discusses this with [name] first. Angie Velasquez agrees to follow up with our clinic in 2 weeks.  Hyperlipidemia Angie Velasquez was informed of the American Heart Association Guidelines emphasizing intensive lifestyle modifications as the first line treatment for hyperlipidemia. We discussed many lifestyle modifications today in depth, and Angie Velasquez will continue to work on decreasing saturated fats such as fatty red meat, butter and many fried foods. She will also increase vegetables and lean protein in her diet and continue to work on exercise and weight loss efforts.  Obesity Angie Velasquez is currently in the action stage of change. As such, her goal is to continue with weight loss efforts She has agreed to follow the Category 2 plan Angie Velasquez has been instructed to work up to a goal of 150 minutes of combined cardio and strengthening exercise per week for weight loss and overall health benefits. We discussed the following Behavioral Modification Strategies today: planning for success, keeping healthy foods in the home and work on meal planning and easy cooking plans  Angie Velasquez has agreed to follow up with our clinic in 2 weeks. She was informed of the importance of frequent follow up visits to maximize her success with intensive lifestyle modifications for her multiple health conditions.  ALLERGIES: Allergies  Allergen Reactions   Epinephrine Palpitations   Codeine Nausea Only   Penicillins Nausea Only   Prednisone Cough    MEDICATIONS: Current Outpatient Medications on File Prior to Visit  Medication Sig Dispense Refill  acetaminophen (TYLENOL) 500 MG tablet Take 500 mg by mouth every 6 (six) hours as needed.     allopurinol (ZYLOPRIM) 300 MG tablet Take 1 tablet (300 mg total) by mouth 2 (two) times daily. 180 tablet 3   AMBULATORY NON FORMULARY MEDICATION Rolling walker with a seat 1 Units 0    amoxicillin-clavulanate (AUGMENTIN) 875-125 MG tablet Take 1 tablet by mouth 2 (two) times daily. (Patient not taking: Reported on 11/03/2019) 20 tablet 0   aspirin 81 MG tablet Take 81 mg by mouth daily.     CALCIUM-MAGNESIUM-ZINC PO Take by mouth.     clonazePAM (KLONOPIN) 0.5 MG tablet TAKE 1 TABLET(0.5 MG) BY MOUTH AT BEDTIME AS NEEDED 90 tablet 1   diazepam (VALIUM) 10 MG tablet Take 10 mg by mouth every 6 (six) hours as needed (as needed for muscle spasms).     diclofenac sodium (VOLTAREN) 1 % GEL Apply topically to affected area qid 100 g 2   gabapentin (NEURONTIN) 300 MG capsule Take 1 capsule, orally, up to TID or as directed 270 capsule 1   HYDROcodone-acetaminophen (NORCO) 5-325 MG tablet Take 1 tablet by mouth every 6 (six) hours as needed. 15 tablet 0   lisinopril (ZESTRIL) 2.5 MG tablet TAKE 1 TABLET EVERY DAY 90 tablet 0   meloxicam (MOBIC) 15 MG tablet TAKE 1 TABLET BY MOUTH DAILY FOR 14 DAYS, THEN AS NEEDED 90 tablet 3   nystatin cream (MYCOSTATIN) APPLY 1 APPLICATION TOPICALLY TO RASH IN GROIN TWICE DAILY FOR UP TO 2 WEEKS MAXIMUM 90 g 2   OVER THE COUNTER MEDICATION Cherry extract bid     OVER THE COUNTER MEDICATION B-12 one daily     OVER THE COUNTER MEDICATION Vitamin D     sertraline (ZOLOFT) 50 MG tablet Take 1 tablet (50 mg total) by mouth daily. 90 tablet 3   simvastatin (ZOCOR) 20 MG tablet TAKE 1 TABLET AT BEDTIME 90 tablet 0   Sodium Bicarbonate (NICE PURE BAKING SODA) POWD by Does not apply route.     traMADol (ULTRAM) 50 MG tablet Take 1 tablet (50 mg total) by mouth every 6 (six) hours as needed for moderate pain or severe pain. 60 tablet 0   triamcinolone cream (KENALOG) 0.1 % Apply 1 application topically 2 (two) times daily. For 7-10 days for recurring eczema issues 80 g 1   Vitamin D, Ergocalciferol, (DRISDOL) 1.25 MG (50000 UT) CAPS capsule Take 1 capsule (50,000 Units total) by mouth every 7 (seven) days. 12 capsule 0   No current  facility-administered medications on file prior to visit.     PAST MEDICAL HISTORY: Past Medical History:  Diagnosis Date   Anxiety    years ago - no longer an issue   Arthritis    gout   Cataracts, bilateral    Complication of anesthesia    Fracture of pelvis (HCC)    GERD (gastroesophageal reflux disease)    Hx of chest pain    "anxiety"   Hyperlipidemia    INSOMNIA 10/14/2008   in past   Joint pain    Kidney problem    Low blood sugar    eats several smaller meals a day   Obesity    Palpitations    Stress related    Proteinuria    H/O   Tobacco abuse    quit smoking 08/2013    PAST SURGICAL HISTORY: Past Surgical History:  Procedure Laterality Date   ABDOMINAL HYSTERECTOMY     APPENDECTOMY  with hysterectomy   childbirth     x4   CHOLECYSTECTOMY  2001   EYE SURGERY Bilateral 2015   cataract surgery with lens implant   NEPHRECTOMY  1992   complex cystic mass - nephrectomy right   ORIF ANKLE FRACTURE Right 07/02/2014   Procedure: OPEN REDUCTION INTERNAL FIXATION (ORIF) RIGHT ANKLE BIMALLOELAR FRACTURE;  Surgeon: Wylene Simmer, MD;  Location: [facility];  Service: Orthopedics;  Laterality: Right;   OTHER SURGICAL HISTORY     hysterectomy, R ovary remains   SHOULDER SURGERY     bone spurs left    SOCIAL HISTORY: Social History   Tobacco Use   Smoking status: Former Smoker    Packs/day: 1.00    Years: 35.00    Pack years: 35.00    Types: Cigarettes    Quit date: 09/01/2013    Years since quitting: 6.1   Smokeless tobacco: Never Used   Tobacco comment: Former smoker with no desire to smoke again. Smoking years edited at patients request to include periods when she did not smoke.  Substance Use Topics   Alcohol use: Yes    Alcohol/week: 1.0 standard drinks    Types: 1 Shots of liquor per week   Drug use: No    FAMILY HISTORY: Family History  Problem Relation Age of Onset   Stroke Father    Alzheimer's disease Father         early, states also parkinsons. died 62   Hypertension Father    Obesity Father    Ovarian cancer Mother        Dr. Jana Hakim    Arthritis Mother    Obesity Mother    Colon cancer Paternal Grandmother    Liver disease Daughter        On hospice age 36    ROS: Review of Systems  Constitutional: Positive for weight loss.  Cardiovascular: Negative for chest pain.  Gastrointestinal: Negative for nausea and vomiting.  Musculoskeletal:       Negative for muscle weakness    PHYSICAL EXAM: Pt in no acute distress  RECENT LABS AND TESTS: BMET    Component Value Date/Time   NA 144 08/28/2019 0803   K 3.9 08/28/2019 0803   CL 105 08/28/2019 0803   CO2 22 08/28/2019 0803   GLUCOSE 101 (H) 08/28/2019 0803   GLUCOSE 109 (H) 01/18/2017 0902   BUN 14 08/28/2019 0803   CREATININE 1.14 (H) 08/28/2019 0803   CALCIUM 9.3 08/28/2019 0803   GFRNONAA 48 (L) 08/28/2019 0803   GFRAA 56 (L) 08/28/2019 0803   Lab Results  Component Value Date   HGBA1C 5.8 (H) 08/28/2019   HGBA1C 5.7 (H) 01/09/2019   HGBA1C 5.8 (H) 07/25/2018   HGBA1C 5.7 (H) 09/18/2017   HGBA1C (H) 04/15/2010    5.9 (NOTE)                                                                       According to the ADA Clinical Practice Recommendations for 2011, when HbA1c is used as a screening test:   >=6.5%   Diagnostic of Diabetes Mellitus           (if abnormal result  is confirmed)  5.7-6.4%   Increased risk of  developing Diabetes Mellitus  References:Diagnosis and Classification of Diabetes Mellitus,Diabetes [id number] 1):S62-S69 and Standards of Medical Care in         Diabetes - 2011,Diabetes [id number]  (Suppl 1):S11-S61.   Lab Results  Component Value Date   INSULIN 25.2 (H) 08/28/2019   INSULIN 18.6 01/09/2019   INSULIN 22.0 07/25/2018   INSULIN 25.3 (H) 09/18/2017   CBC    Component Value Date/Time   WBC 7.1 08/28/2019 0803   WBC 6.6 01/18/2017 0902   RBC 4.48 08/28/2019 0803   RBC 4.39  01/18/2017 0902   HGB 13.4 08/28/2019 0803   HCT 40.3 08/28/2019 0803   PLT 242 08/28/2019 0803   MCV 90 08/28/2019 0803   MCH 29.9 08/28/2019 0803   MCH 29.4 07/02/2014 1143   MCHC 33.3 08/28/2019 0803   MCHC 33.2 01/18/2017 0902   RDW 14.2 08/28/2019 0803   LYMPHSABS 3.0 08/28/2019 0803   MONOABS 0.9 04/15/2010 2135   EOSABS 0.2 08/28/2019 0803   BASOSABS 0.1 08/28/2019 0803   Iron/TIBC/Ferritin/ %Sat No results found for: IRON, TIBC, FERRITIN, IRONPCTSAT Lipid Panel     Component Value Date/Time   CHOL 183 08/28/2019 0803   TRIG 236 (H) 08/28/2019 0803   HDL 56 08/28/2019 0803   CHOLHDL 2 01/18/2017 0902   VLDL 28.2 01/18/2017 0902   LDLCALC 88 08/28/2019 0803   LDLDIRECT 77.0 01/18/2017 0902   Hepatic Function Panel     Component Value Date/Time   PROT 6.5 08/28/2019 0803   ALBUMIN 4.2 08/28/2019 0803   AST 25 08/28/2019 0803   ALT 26 08/28/2019 0803   ALKPHOS 117 08/28/2019 0803   BILITOT 0.3 08/28/2019 0803   BILIDIR <0.1 04/15/2010 2135   IBILI NOT CALCULATED 04/15/2010 2135      Component Value Date/Time   TSH 3.430 09/18/2017 1049   TSH 2.09 05/25/2015 0820   TSH 1.89 12/31/2007 0831     Ref. Range 08/28/2019 08:03  Vitamin D, 25-Hydroxy Latest Ref Range: 30.0 - 100.0 ng/mL 42.1    I, Doreene Nest, am acting as Location manager for Abby Potash, PA-C I, Abby Potash, PA-C have reviewed above note and agree with its content

## 2019-11-05 NOTE — Telephone Encounter (Signed)
I sent in dexamethasone elixir she can swish and spit out 3x a day- hopefully this will calm inflammation. Would need record of most recent covid test if done recently if we need to see her in office if symptoms fail to improve.   Has she had flu shot? Please update status.

## 2019-11-05 NOTE — Telephone Encounter (Signed)
LAST APPOINTMENT DATE: 11/04/2019  NEXT APPOINTMENT DATE:@12 /01/2019   Think this patient just established with your office :

## 2019-11-06 NOTE — Telephone Encounter (Signed)
Called and lm on pt vm with below info.

## 2019-11-08 DIAGNOSIS — B349 Viral infection, unspecified: Secondary | ICD-10-CM | POA: Diagnosis not present

## 2019-11-08 DIAGNOSIS — R05 Cough: Secondary | ICD-10-CM | POA: Diagnosis not present

## 2019-11-08 DIAGNOSIS — Z20828 Contact with and (suspected) exposure to other viral communicable diseases: Secondary | ICD-10-CM | POA: Diagnosis not present

## 2019-11-18 ENCOUNTER — Other Ambulatory Visit: Payer: Self-pay

## 2019-11-18 ENCOUNTER — Encounter (INDEPENDENT_AMBULATORY_CARE_PROVIDER_SITE_OTHER): Payer: Self-pay | Admitting: Physician Assistant

## 2019-11-18 ENCOUNTER — Telehealth (INDEPENDENT_AMBULATORY_CARE_PROVIDER_SITE_OTHER): Payer: Medicare HMO | Admitting: Physician Assistant

## 2019-11-18 DIAGNOSIS — E559 Vitamin D deficiency, unspecified: Secondary | ICD-10-CM

## 2019-11-18 DIAGNOSIS — E7849 Other hyperlipidemia: Secondary | ICD-10-CM

## 2019-11-18 DIAGNOSIS — Z6835 Body mass index (BMI) 35.0-35.9, adult: Secondary | ICD-10-CM

## 2019-11-18 MED ORDER — VITAMIN D (ERGOCALCIFEROL) 1.25 MG (50000 UNIT) PO CAPS: 50000.0000 [IU] | ORAL_CAPSULE | ORAL | 0 refills | Status: DC

## 2019-11-20 NOTE — Progress Notes (Signed)
Office: [phone]  /  Fax: [phone] TeleHealth Visit:  Angie Velasquez has verbally consented to this TeleHealth visit today. The patient is located at home, the provider is located at the [facility] office. The participants in this visit include the listed provider and patient. The visit was conducted today via Webex.  HPI:  Chief Complaint: OBESITY Angie Velasquez is here to discuss her progress with her obesity treatment plan. She is on the Category 2 plan and states she is following her eating plan approximately 50% of the time. She states she is exercising 0 minutes 0 times per week.  Treyana's most recent weight was 245 lbs. She reports not getting enough protein throughout the day and notes a lack of hunger throughout the day ever since being sick over the last few weeks.  Today's visit was #37  Starting weight: 249 lbs Starting date: 09/18/2017   Vitamin D deficiency Angie Velasquez has a diagnosis of Vitamin D deficiency and is on prescription Vitamin D weekly. No nausea, vomiting, or muscle weakness.  Hyperlipidemia Angie Velasquez has a diagnosis of hyperlipidemia and is on Zocor. No chest pain.  ASSESSMENT AND PLAN:  Vitamin D deficiency - Plan: Vitamin D, Ergocalciferol, (DRISDOL) 1.25 MG (50000 UT) CAPS capsule  Other hyperlipidemia  Class 2 severe obesity with serious comorbidity and body mass index (BMI) of 35.0 to 35.9 in adult, unspecified obesity type ([place])  PLAN:  Vitamin D Deficiency Angie Velasquez was informed that low Vitamin D levels contributes to fatigue and are associated with obesity, breast, and colon cancer. She agrees to continue to take prescription Vit D @ 50,000 IU every week #12 with 0 refills and will follow-up for routine testing of Vitamin D, at least 2-3 times per year. She was informed of the risk of over-replacement of Vitamin D and agrees to not increase her dose unless she discusses this with [name] first. Angie Velasquez agrees to follow-up with our  clinic in 3 weeks.  Hyperlipidemia Intensive lifestyle modifications as the first line treatment for hyperlipidemia. We discussed many lifestyle modifications today and Angie Velasquez will continue weight loss, medications, and continue to work on diet, exercise and weight loss efforts.  Obesity Angie Velasquez is currently in the action stage of change. As such, her goal is to continue with weight loss efforts. She has agreed to follow the Category 2 plan. Angie Velasquez has been instructed to work up to a goal of 150 minutes of combined cardio and strengthening exercise per week for weight loss and overall health benefits. We discussed the following Behavioral Modification Strategies today: increasing lean protein intake and no skipping meals.  Angie Velasquez has agreed to follow-up with our clinic in 3 weeks. She was informed of the importance of frequent follow-up visits to maximize her success with intensive lifestyle modifications for her multiple health conditions.  I spent > than 50% of the 25 minute visit on counseling as documented in the note.    ALLERGIES: Allergies  Allergen Reactions  . Epinephrine Palpitations  . Codeine Nausea Only  . Penicillins Nausea Only  . Prednisone Cough    MEDICATIONS: Current Outpatient Medications on File Prior to Visit  Medication Sig Dispense Refill  . acetaminophen (TYLENOL) 500 MG tablet Take 500 mg by mouth every 6 (six) hours as needed.    [name] allopurinol (ZYLOPRIM) 300 MG tablet Take 1 tablet (300 mg total) by mouth 2 (two) times daily. 180 tablet 3  . AMBULATORY NON FORMULARY MEDICATION Rolling walker with a seat 1 Units 0  .  amoxicillin-clavulanate (AUGMENTIN) 875-125 MG tablet Take 1 tablet by mouth 2 (two) times daily. (Patient not taking: Reported on 11/03/2019) 20 tablet 0  . aspirin 81 MG tablet Take 81 mg by mouth daily.    [name] CALCIUM-MAGNESIUM-ZINC PO Take by mouth.    . clonazePAM (KLONOPIN) 0.5 MG tablet TAKE 1 TABLET(0.5 MG) BY MOUTH AT BEDTIME AS  NEEDED 90 tablet 1  . dexamethasone 0.5 MG/5ML elixir For 5 to 7 days- 5 mL swish and spit three  times daily. It is important to keep the medication in the mouth for five minutes prior to spitting it out. Do not rinse afterward and avoid eating or drinking for 30 minutes. 237 mL 0  . diazepam (VALIUM) 10 MG tablet Take 10 mg by mouth every 6 (six) hours as needed (as needed for muscle spasms).    . diclofenac sodium (VOLTAREN) 1 % GEL Apply topically to affected area qid 100 g 2  . gabapentin (NEURONTIN) 300 MG capsule Take 1 capsule, orally, up to TID or as directed 270 capsule 1  . HYDROcodone-acetaminophen (NORCO) 5-325 MG tablet Take 1 tablet by mouth every 6 (six) hours as needed. 15 tablet 0  . lisinopril (ZESTRIL) 2.5 MG tablet TAKE 1 TABLET EVERY DAY 90 tablet 0  . meloxicam (MOBIC) 15 MG tablet TAKE 1 TABLET BY MOUTH DAILY FOR 14 DAYS, THEN AS NEEDED 90 tablet 3  . nystatin cream (MYCOSTATIN) APPLY 1 APPLICATION TOPICALLY TO RASH IN GROIN TWICE DAILY FOR UP TO 2 WEEKS MAXIMUM 90 g 2  . OVER THE COUNTER MEDICATION Cherry extract bid    . OVER THE COUNTER MEDICATION B-12 one daily    . OVER THE COUNTER MEDICATION Vitamin D    . sertraline (ZOLOFT) 50 MG tablet Take 1 tablet (50 mg total) by mouth daily. 90 tablet 3  . simvastatin (ZOCOR) 20 MG tablet TAKE 1 TABLET AT BEDTIME 90 tablet 0  . Sodium Bicarbonate (NICE PURE BAKING SODA) POWD by Does not apply route.    . traMADol (ULTRAM) 50 MG tablet Take 1 tablet (50 mg total) by mouth every 6 (six) hours as needed for moderate pain or severe pain. 60 tablet 0  . triamcinolone cream (KENALOG) 0.1 % Apply 1 application topically 2 (two) times daily. For 7-10 days for recurring eczema issues 80 g 1   No current facility-administered medications on file prior to visit.    PAST MEDICAL HISTORY: Past Medical History:  Diagnosis Date  . Anxiety    years ago - no longer an issue  . Arthritis    gout  . Cataracts, bilateral   . Complication  of anesthesia   . Fracture of pelvis ([place])   . GERD (gastroesophageal reflux disease)   . Hx of chest pain    "anxiety"  . Hyperlipidemia   . INSOMNIA 10/14/2008   in past  . Joint pain   . Kidney problem   . Low blood sugar    eats several smaller meals a day  . Obesity   . Palpitations    Stress related   . Proteinuria    H/O  . Tobacco abuse    quit smoking 08/2013    PAST SURGICAL HISTORY: Past Surgical History:  Procedure Laterality Date  . ABDOMINAL HYSTERECTOMY    . APPENDECTOMY     with hysterectomy  . childbirth     x4  . CHOLECYSTECTOMY  2001  . EYE SURGERY Bilateral 2015   cataract surgery with lens implant  .  NEPHRECTOMY  1992   complex cystic mass - nephrectomy right  . ORIF ANKLE FRACTURE Right 07/02/2014   Procedure: OPEN REDUCTION INTERNAL FIXATION (ORIF) RIGHT ANKLE BIMALLOELAR FRACTURE;  Surgeon: Wylene Simmer, MD;  Location: [facility];  Service: Orthopedics;  Laterality: Right;  . OTHER SURGICAL HISTORY     hysterectomy, R ovary remains  . SHOULDER SURGERY     bone spurs left    SOCIAL HISTORY: Social History   Tobacco Use  . Smoking status: Former Smoker    Packs/day: 1.00    Years: 35.00    Pack years: 35.00    Types: Cigarettes    Quit date: 09/01/2013    Years since quitting: 6.2  . Smokeless tobacco: Never Used  . Tobacco comment: Former smoker with no desire to smoke again. Smoking years edited at patients request to include periods when she did not smoke.  Substance Use Topics  . Alcohol use: Yes    Alcohol/week: 1.0 standard drinks    Types: 1 Shots of liquor per week  . Drug use: No    FAMILY HISTORY: Family History  Problem Relation Age of Onset  . Stroke Father   . Alzheimer's disease Father        early, states also parkinsons. died 1  . Hypertension Father   . Obesity Father   . Ovarian cancer Mother        Dr. Jana Hakim   . Arthritis Mother   . Obesity Mother   . Colon cancer Paternal Grandmother   . Liver disease  Daughter        On hospice age 68   ROS: Review of Systems  Cardiovascular: Negative for chest pain.  Gastrointestinal: Negative for nausea and vomiting.  Musculoskeletal:       Negative for muscle weakness.   PHYSICAL EXAM: There were no vitals taken for this visit. There is no height or weight on file to calculate BMI. Physical Exam: Pt in no acute distress.  RECENT LABS AND TESTS: BMET    Component Value Date/Time   NA 144 08/28/2019 0803   K 3.9 08/28/2019 0803   CL 105 08/28/2019 0803   CO2 22 08/28/2019 0803   GLUCOSE 101 (H) 08/28/2019 0803   GLUCOSE 109 (H) 01/18/2017 0902   BUN 14 08/28/2019 0803   CREATININE 1.14 (H) 08/28/2019 0803   CALCIUM 9.3 08/28/2019 0803   GFRNONAA 48 (L) 08/28/2019 0803   GFRAA 56 (L) 08/28/2019 0803   Lab Results  Component Value Date   HGBA1C 5.8 (H) 08/28/2019   HGBA1C 5.7 (H) 01/09/2019   HGBA1C 5.8 (H) 07/25/2018   HGBA1C 5.7 (H) 09/18/2017   HGBA1C (H) 04/15/2010    5.9 (NOTE)                                                                       According to the ADA Clinical Practice Recommendations for 2011, when HbA1c is used as a screening test:   >=6.5%   Diagnostic of Diabetes Mellitus           (if abnormal result  is confirmed)  5.7-6.4%   Increased risk of developing Diabetes Mellitus  References:Diagnosis and Classification of Diabetes Mellitus,Diabetes [id number] 1):S62-S69 and Standards of Medical Care  in         Diabetes - 2011,Diabetes Care,2011,34  (Suppl 1):S11-S61.   Lab Results  Component Value Date   INSULIN 25.2 (H) 08/28/2019   INSULIN 18.6 01/09/2019   INSULIN 22.0 07/25/2018   INSULIN 25.3 (H) 09/18/2017   CBC    Component Value Date/Time   WBC 7.1 08/28/2019 0803   WBC 6.6 01/18/2017 0902   RBC 4.48 08/28/2019 0803   RBC 4.39 01/18/2017 0902   HGB 13.4 08/28/2019 0803   HCT 40.3 08/28/2019 0803   PLT 242 08/28/2019 0803   MCV 90 08/28/2019 0803   MCH 29.9 08/28/2019 0803   MCH 29.4  07/02/2014 1143   MCHC 33.3 08/28/2019 0803   MCHC 33.2 01/18/2017 0902   RDW 14.2 08/28/2019 0803   LYMPHSABS 3.0 08/28/2019 0803   MONOABS 0.9 04/15/2010 2135   EOSABS 0.2 08/28/2019 0803   BASOSABS 0.1 08/28/2019 0803   Iron/TIBC/Ferritin/ %Sat No results found for: IRON, TIBC, FERRITIN, IRONPCTSAT Lipid Panel     Component Value Date/Time   CHOL 183 08/28/2019 0803   TRIG 236 (H) 08/28/2019 0803   HDL 56 08/28/2019 0803   CHOLHDL 2 01/18/2017 0902   VLDL 28.2 01/18/2017 0902   LDLCALC 88 08/28/2019 0803   LDLDIRECT 77.0 01/18/2017 0902   Hepatic Function Panel     Component Value Date/Time   PROT 6.5 08/28/2019 0803   ALBUMIN 4.2 08/28/2019 0803   AST 25 08/28/2019 0803   ALT 26 08/28/2019 0803   ALKPHOS 117 08/28/2019 0803   BILITOT 0.3 08/28/2019 0803   BILIDIR <0.1 04/15/2010 2135   IBILI NOT CALCULATED 04/15/2010 2135      Component Value Date/Time   TSH 3.430 09/18/2017 1049   TSH 2.09 05/25/2015 0820   TSH 1.89 12/31/2007 0831    OBESITY BEHAVIORAL INTERVENTION VISIT DOCUMENTATION FOR INSURANCE (~15 minutes)  I, Michaelene Song, am acting as Location manager for Abby Potash, PA-C I, Abby Potash, PA-C have reviewed above note and agree with its content

## 2019-11-24 ENCOUNTER — Encounter: Payer: Self-pay | Admitting: Family Medicine

## 2019-12-01 ENCOUNTER — Other Ambulatory Visit (INDEPENDENT_AMBULATORY_CARE_PROVIDER_SITE_OTHER): Payer: Self-pay | Admitting: Physician Assistant

## 2019-12-01 DIAGNOSIS — E559 Vitamin D deficiency, unspecified: Secondary | ICD-10-CM

## 2019-12-08 ENCOUNTER — Other Ambulatory Visit: Payer: Self-pay

## 2019-12-09 ENCOUNTER — Encounter: Payer: Self-pay | Admitting: Family Medicine

## 2019-12-09 ENCOUNTER — Ambulatory Visit (INDEPENDENT_AMBULATORY_CARE_PROVIDER_SITE_OTHER): Payer: Medicare HMO | Admitting: Family Medicine

## 2019-12-09 VITALS — BP 128/82 | HR 92 | Temp 97.6°F | Ht 68.0 in | Wt 245.0 lb

## 2019-12-09 DIAGNOSIS — M869 Osteomyelitis, unspecified: Secondary | ICD-10-CM

## 2019-12-09 DIAGNOSIS — M1A9XX0 Chronic gout, unspecified, without tophus (tophi): Secondary | ICD-10-CM

## 2019-12-09 DIAGNOSIS — F3289 Other specified depressive episodes: Secondary | ICD-10-CM

## 2019-12-09 DIAGNOSIS — J449 Chronic obstructive pulmonary disease, unspecified: Secondary | ICD-10-CM | POA: Diagnosis not present

## 2019-12-09 DIAGNOSIS — I10 Essential (primary) hypertension: Secondary | ICD-10-CM | POA: Diagnosis not present

## 2019-12-09 DIAGNOSIS — M898X8 Other specified disorders of bone, other site: Secondary | ICD-10-CM

## 2019-12-09 DIAGNOSIS — E785 Hyperlipidemia, unspecified: Secondary | ICD-10-CM | POA: Diagnosis not present

## 2019-12-09 DIAGNOSIS — R0683 Snoring: Secondary | ICD-10-CM

## 2019-12-09 DIAGNOSIS — Z6835 Body mass index (BMI) 35.0-35.9, adult: Secondary | ICD-10-CM | POA: Diagnosis not present

## 2019-12-09 MED ORDER — CLONAZEPAM 0.5 MG PO TABS: ORAL_TABLET | ORAL | 1 refills | Status: DC

## 2019-12-09 MED ORDER — GABAPENTIN 300 MG PO CAPS: ORAL_CAPSULE | ORAL | 1 refills | Status: DC

## 2019-12-09 MED ORDER — DIAZEPAM 10 MG PO TABS: 10.0000 mg | ORAL_TABLET | Freq: Four times a day (QID) | ORAL | 0 refills | Status: DC | PRN

## 2019-12-09 NOTE — Progress Notes (Signed)
Phone [phone] In person visit   Subjective:   Angie Velasquez is a 74 y.o. year old very pleasant female patient who presents for/with See problem oriented charting Chief Complaint  Patient presents with  . Follow-up    ROS- Review of Systems  Constitutional: Negative.   HENT: Positive for hearing loss and tinnitus.        On going left ear Had large amount of wax in right ear few days ago.   Eyes: Negative.   Respiratory: Negative.   Cardiovascular: Negative.   Gastrointestinal: Positive for heartburn.       Working on changes in diet and over the counter options.   Genitourinary: Positive for hematuria.       Under treatment in the past   Skin: Negative.   Neurological: Negative.   Endo/Heme/Allergies: Negative.   Psychiatric/Behavioral: Negative.      This visit occurred during the SARS-CoV-2 public health emergency.  Safety protocols were in place, including screening questions prior to the visit, additional usage of staff PPE, and extensive cleaning of exam room while observing appropriate contact time as indicated for disinfecting solutions.   Past Medical History-  Patient Active Problem List   Diagnosis Date Noted  . Depression 12/19/2016    Priority: High  . Anxiety state 07/20/2016    Priority: High  . S/p nephrectomy 03/12/2015    Priority: High  . Essential hypertension 01/04/2016    Priority: Medium  . Gout 03/12/2015    Priority: Medium  . Hyperlipidemia 03/12/2015    Priority: Medium  . COPD (chronic obstructive pulmonary disease) ([place]) 03/12/2015    Priority: Medium  . Arthritis     Priority: Medium  . Former smoker     Priority: Medium  . MICROALBUMINURIA 01/23/2008    Priority: Medium  . Eczema 06/29/2015    Priority: Low  . Tinnitus 03/12/2015    Priority: Low  . History of colonic polyps 03/12/2015    Priority: Low  . Bimalleolar fracture 07/02/2014    Priority: Low  . GERD 01/23/2008    Priority: Low  . Microscopic  hematuria 01/23/2008    Priority: Low  . UTI (urinary tract infection) 01/08/2008    Priority: Low  . CKD (chronic kidney disease), stage III 02/14/2019  . Osteitis condensans 06/25/2018  . Osteoarthritis of spine with radiculopathy, cervical region 02/12/2018  . Intertrigo 01/10/2018  . Pes anserinus bursitis of left knee 01/02/2018  . Osteitis pubis ([place]) 12/19/2017  . Prediabetes 10/22/2017  . Other fatigue 09/18/2017  . Shortness of breath on exertion 09/18/2017  . Vitamin D deficiency 09/18/2017  . Hyperglycemia 09/18/2017  . Class 2 severe obesity with serious comorbidity and body mass index (BMI) of 35.0 to 35.9 in adult ([place]) 06/13/2017  . Atypical chest pain 03/26/2017    Medications- reviewed and updated Current Outpatient Medications  Medication Sig Dispense Refill  . acetaminophen (TYLENOL) 500 MG tablet Take 500 mg by mouth every 6 (six) hours as needed.    [name] allopurinol (ZYLOPRIM) 300 MG tablet Take 1 tablet (300 mg total) by mouth 2 (two) times daily. 180 tablet 3  . AMBULATORY NON FORMULARY MEDICATION Rolling walker with a seat 1 Units 0  . aspirin 81 MG tablet Take 81 mg by mouth daily.    [name] CALCIUM-MAGNESIUM-ZINC PO Take by mouth.    . clonazePAM (KLONOPIN) 0.5 MG tablet TAKE 1 TABLET(0.5 MG) BY MOUTH AT BEDTIME AS NEEDED 90 tablet 1  . diazepam (VALIUM) 10 MG tablet  Take 1 tablet (10 mg total) by mouth every 6 (six) hours as needed (as needed for muscle spasms). 30 tablet 0  . diclofenac sodium (VOLTAREN) 1 % GEL Apply topically to affected area qid 100 g 2  . gabapentin (NEURONTIN) 300 MG capsule Take 1 capsule, orally, up to TID or as directed 270 capsule 1  . lisinopril (ZESTRIL) 2.5 MG tablet TAKE 1 TABLET EVERY DAY 90 tablet 0  . meloxicam (MOBIC) 15 MG tablet TAKE 1 TABLET BY MOUTH DAILY FOR 14 DAYS, THEN AS NEEDED 90 tablet 3  . nystatin cream (MYCOSTATIN) APPLY 1 APPLICATION TOPICALLY TO RASH IN GROIN TWICE DAILY FOR UP TO 2 WEEKS MAXIMUM 90 g 2  . OVER  THE COUNTER MEDICATION Cherry extract bid    . OVER THE COUNTER MEDICATION B-12 one daily    . OVER THE COUNTER MEDICATION Vitamin D    . sertraline (ZOLOFT) 50 MG tablet Take 1 tablet (50 mg total) by mouth daily. 90 tablet 3  . simvastatin (ZOCOR) 20 MG tablet TAKE 1 TABLET AT BEDTIME 90 tablet 0  . Sodium Bicarbonate (NICE PURE BAKING SODA) POWD by Does not apply route.    . traMADol (ULTRAM) 50 MG tablet Take 1 tablet (50 mg total) by mouth every 6 (six) hours as needed for moderate pain or severe pain. 60 tablet 0  . triamcinolone cream (KENALOG) 0.1 % Apply 1 application topically 2 (two) times daily. For 7-10 days for recurring eczema issues 80 g 1   No current facility-administered medications for this visit.     Objective:  BP 128/82   Pulse 92   Temp 97.6 F (36.4 C) (Temporal)   Ht 5\' 8"  (1.727 m)   Wt 245 lb (111.1 kg)   SpO2 95%   BMI 37.25 kg/m  Gen: NAD, resting comfortably CV: RRR no murmurs rubs or gallops Lungs: CTAB no crackles, wheeze, rhonchi Ext: no edema Skin: warm, dry    Assessment and Plan  Other notes: 1.new townhome in [place], [place] splitting time between here and [place] . 700 [address] drive- drives by herself 2. Declines flu shot- felt sick last time.   #Hyperlipidemia S: Compliant with simvastatin 20 mg. No problems or side effects.  Lab Results  Component Value Date   CHOL 183 08/28/2019   HDL 56 08/28/2019   LDLCALC 88 08/28/2019   LDLDIRECT 77.0 01/18/2017   TRIG 236 (H) 08/28/2019   CHOLHDL 2 01/18/2017  A/P: Mild poor control on simvastatin 20 mg with LDL at 88.  We discussed increasing statin but she would prefer to focus on lifestyle    #Hypertension/status post nephrectomy for complex cystic mass S: Compliant with lisinopril 2.5 mg-though primarily on for renal protective effects with history of microalbuminuria. She does not take blood pressures at home.  A/P:   Stable. Continue current medications.  She prefers to check this  (BMP) with healthy weigh to wellness as has labs coming up with them.    #Depression-full remission/anxiety S: .  She is also on low-dose Zoloft 50 mg. Has taken Wellbutrin in the past but no longer on that.    Big drivers of stress in her life are her husband with mild cognitive impairment, hoarding, change in personality over last few years. She has been working on boundaries with him. She moved out 2 years ago and that has been helpful. They have filed for divorce.   Due to anxiety/stress-uses clonazepam-uses pretty regularly for sleep about 4x a week for  sleep.  Uses very sparing diazepam for spasms in her neck balance ear. She does need refill on diazepam (last fill 2 years ago).  She knows to space the use of these medicines by at least 12 hours  A/P: Reasonable control of depression in full remission. Refill clonazepam for sleep- advised could try half  #Gout S: 0 flares recently on allopurinol 300 mg twice a day Lab Results  Component Value Date   LABURIC 4.2 08/28/2019  A/P:Stable. Continue current medications.     #osteitis Pubis has been much better- has not had to take the meloxicam thankfully. Gabapentin looks like was prescribed for cervical spine radiculopathy but she feels helps with osteitis pubis  #thinks she had a bite on forearm 7 weeks ago- slowly healing. Did not see what bit her. Up to date on tdap.   # Sleep study  S:She has not noticed any change in sleeping but has been told that she is snoring in her sleep. She does have sore throat in the morning.  A/P: has good relationship with Dr. Brett Fairy- will refer patient at her request   Recommended follow up: Return in about 6 months (around 06/07/2020) for physical or sooner if needed. Future Appointments  Date Time Provider [place]  12/11/2019 11:00 AM Abby Potash, PA-C [facility] None    Lab/Order associations:   ICD-10-CM   1. Other depression  F32.89   2. Hyperlipidemia, unspecified hyperlipidemia  type  E78.5   3. Chronic gout without tophus, unspecified cause, unspecified site  M1A.[id number]   4. Essential hypertension  I10   5. Osteitis pubis ([facility]) Chronic - see discussion above M86.9   6. Chronic obstructive pulmonary disease, unspecified COPD type ([place]) Chronic - stable with no issues. Continue to monitor.  J44.9   7. Class 2 severe obesity with serious comorbidity and body mass index (BMI) of 35.0 to 35.9 in adult, unspecified obesity type ([place]) Chronic -continues with healthy weigh to wellness- has done a good job with weight stability.  E66.01    Z68.35   8. Snoring  R06.83 Ambulatory referral to Neurology    Meds ordered this encounter  Medications  . diazepam (VALIUM) 10 MG tablet    Sig: Take 1 tablet (10 mg total) by mouth every 6 (six) hours as needed (as needed for muscle spasms).    Dispense:  30 tablet    Refill:  0  . gabapentin (NEURONTIN) 300 MG capsule    Sig: Take 1 capsule, orally, up to TID or as directed    Dispense:  270 capsule    Refill:  1  . clonazePAM (KLONOPIN) 0.5 MG tablet    Sig: TAKE 1 TABLET(0.5 MG) BY MOUTH AT BEDTIME AS NEEDED    Dispense:  90 tablet    Refill:  1   Return precautions advised.  Garret Reddish, MD

## 2019-12-09 NOTE — Patient Instructions (Addendum)
Great to see you today! Wishing you safe travels!   Recommended follow up: Return in about 6 months (around 06/07/2020) for physical or sooner if needed.

## 2019-12-11 ENCOUNTER — Other Ambulatory Visit: Payer: Self-pay

## 2019-12-11 ENCOUNTER — Ambulatory Visit (INDEPENDENT_AMBULATORY_CARE_PROVIDER_SITE_OTHER): Payer: Medicare HMO | Admitting: Physician Assistant

## 2019-12-11 ENCOUNTER — Encounter (INDEPENDENT_AMBULATORY_CARE_PROVIDER_SITE_OTHER): Payer: Self-pay | Admitting: Physician Assistant

## 2019-12-11 VITALS — BP 118/67 | HR 90 | Temp 98.2°F | Ht 68.0 in | Wt 247.0 lb

## 2019-12-11 DIAGNOSIS — E559 Vitamin D deficiency, unspecified: Secondary | ICD-10-CM

## 2019-12-11 DIAGNOSIS — Z6837 Body mass index (BMI) 37.0-37.9, adult: Secondary | ICD-10-CM

## 2019-12-11 DIAGNOSIS — E7849 Other hyperlipidemia: Secondary | ICD-10-CM

## 2019-12-11 MED ORDER — VITAMIN D (ERGOCALCIFEROL) 1.25 MG (50000 UNIT) PO CAPS: 50000.0000 [IU] | ORAL_CAPSULE | ORAL | 0 refills | Status: DC

## 2019-12-17 ENCOUNTER — Other Ambulatory Visit: Payer: Self-pay

## 2019-12-17 ENCOUNTER — Ambulatory Visit: Payer: Medicare HMO | Admitting: Neurology

## 2019-12-17 ENCOUNTER — Encounter: Payer: Self-pay | Admitting: Neurology

## 2019-12-17 VITALS — BP 128/84 | HR 89 | Temp 97.6°F | Ht 67.0 in | Wt 249.0 lb

## 2019-12-17 DIAGNOSIS — J438 Other emphysema: Secondary | ICD-10-CM

## 2019-12-17 DIAGNOSIS — Z6839 Body mass index (BMI) 39.0-39.9, adult: Secondary | ICD-10-CM

## 2019-12-17 DIAGNOSIS — R0683 Snoring: Secondary | ICD-10-CM

## 2019-12-17 DIAGNOSIS — S82841S Displaced bimalleolar fracture of right lower leg, sequela: Secondary | ICD-10-CM

## 2019-12-17 DIAGNOSIS — K219 Gastro-esophageal reflux disease without esophagitis: Secondary | ICD-10-CM | POA: Diagnosis not present

## 2019-12-17 DIAGNOSIS — R0789 Other chest pain: Secondary | ICD-10-CM

## 2019-12-17 DIAGNOSIS — E6609 Other obesity due to excess calories: Secondary | ICD-10-CM

## 2019-12-17 NOTE — Progress Notes (Signed)
SLEEP MEDICINE CLINIC    Provider:  Larey Seat, MD  Primary Care Physician:  Angie Velasquez, [place] [address]     Referring Provider: Marin Velasquez, [place],   [id number]          Chief Complaint according to patient   Patient presents with:    . New Patient (Initial Visit)           HISTORY OF PRESENT ILLNESS:  Angie Velasquez is a 74 y.o. year old White or Caucasian female patient and seen upon her request on 12/17/2019. [name] is well known to me as the wife ( former) of Angie, who used to be treated here.  She has plans to live between [place] and [place].   Chief concern according to patient :   [name] reports that she usually can sleep through the night ever times when she suffered from anxiety and depression.  Says this has been a time of considerable recovery for her since she made the decision to move to [place] at least for part of the year.  She does wake up sometimes from her own snoring or from gasping for air and she recently learned that her son since using CPAP for the treatment of obstructive sleep apnea, has also improved his sleep quality.  She has been told by many family member members that she is a snorer and she would like to be evaluated for the possibility of obstructive sleep apnea.   I have the pleasure of seeing Angie Velasquez today, a right-handed White or Caucasian female with a possible sleep disorder.  She  has a past medical history of Anxiety, Arthritis, Cataracts, bilateral, Complication of anesthesia, Fracture of pelvis ([place]), GERD (gastroesophageal reflux disease), chest pain, Hyperlipidemia, INSOMNIA (10/14/2008), Joint pain, Kidney problem, Low blood sugar, Obesity, Palpitations, Proteinuria, and Tobacco abuse. GERD has affecting her sleep.       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  Family status is separated, with adult children, several grandchildren.  Pets are not present. Tobacco use; quit 2014.   ETOH use seldomly.  Caffeine intake in form of Coffee( 2 cups I AM ) Soda( coke 1 can a week) Tea ( sweet , when eating out. ) or energy drinks. Regular exercise in form of walking.         Sleep habits are as follows: The patient's dinner time is between 6 PM. The patient goes to bed at 9.30PM and continues to watch TV - sleep for 3-4 hours, wakes for 1 bathroom breaks, the first time at 3 AM.   The preferred sleep position is left side , with the support of 1 pillow.  Dreams are reportedlyfrequent/vivid.  8.30 AM is the usual rise time.  The patient wakes up spontaneously .[name]  He/ She reports not feeling refreshed or restored in AM, with symptoms such as dry mouth, Naps are taken infrequently, lasting 30 minutes and are more refreshing than nocturnal sleep.    Review of Systems: Out of a complete 14 system review, the patient complains of only the following symptoms, and all other reviewed systems are negative.:  Fatigue, sleepiness , snoring, fragmented sleep, Insomnia- improved since separation.    How likely are you to doze in the following situations: 0 =  not likely, 1 = slight chance, 2 = moderate chance, 3 = high chance   Sitting and Reading? Watching Television? Sitting inactive in a public place (theater or meeting)? As a passenger in a car for an hour without a break? Lying down in the afternoon when circumstances permit? Sitting and talking to someone? Sitting quietly after lunch without alcohol? In a car, while stopped for a few minutes in traffic?   Total = 7/ 24 points   FSS endorsed at 11/ 63 points.   Social History   Socioeconomic History  . Marital status: Married    Spouse name: Angie Velasquez  . Number of children: 4  . Years of education: Not on file  . Highest  education level: Not on file  Occupational History  . Occupation: retired  Tobacco Use  . Smoking status: Former Smoker    Packs/day: 1.00    Years: 35.00    Pack years: 35.00    Types: Cigarettes    Quit date: 09/01/2013    Years since quitting: 6.2  . Smokeless tobacco: Never Used  . Tobacco comment: Former smoker with no desire to smoke again. Smoking years edited at patients request to include periods when she did not smoke.  Substance and Sexual Activity  . Alcohol use: Yes    Alcohol/week: 1.0 standard drinks    Types: 1 Shots of liquor per week  . Drug use: No  . Sexual activity: Not on file  Other Topics Concern  . Not on file  Social History Narrative   Divorced. Found love of her life recently in 2016 (Angie). 3 sons. 5 grandkids.    Daughter (had at age 35 and was adopted). To care for [name] her granddaughter when daughter passes- daughter on hospice.       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 Difficulty of Paying Living Expenses: Not on file  Food Insecurity:   . Worried About Charity fundraiser in the Last Year: Not on file  . Ran Out of Food in the Last Year: Not on file  Transportation Needs:   . Lack of Transportation (Medical): Not on file  . Lack of Transportation (Non-Medical): Not on file  Physical Activity:   . Days of Exercise per Week: Not on file  . Minutes of Exercise per Session: Not on file  Stress:   . Feeling of Stress : Not on file  Social Connections:   . Frequency of Communication with Friends and Family: Not on file  . Frequency of Social Gatherings with Friends and Family: Not on file  . Attends Religious Services: Not on file  . Active Member of Clubs or Organizations: Not on file   . Attends Archivist Meetings: Not on file  . Marital Status: Not on file    Family History  Problem Relation Age of Onset  . Stroke Father   . Alzheimer's disease Father        early, states also parkinsons. died 87  . Hypertension Father   . Obesity Father   . Ovarian cancer Mother        Dr. Jana Hakim   . Arthritis Mother   . Obesity Mother   . Colon cancer  Paternal Grandmother   . Liver disease Daughter        On hospice age 73    Past Medical History:  Diagnosis Date  . Anxiety    years ago - no longer an issue  . Arthritis    gout  . Cataracts, bilateral   . Complication of anesthesia   . Fracture of pelvis ([facility])   . GERD (gastroesophageal reflux disease)   . Hx of chest pain    "anxiety"  . Hyperlipidemia   . INSOMNIA 10/14/2008   in past  . Joint pain   . Kidney problem   . Low blood sugar    eats several smaller meals a day  . Obesity   . Palpitations    Stress related   . Proteinuria    H/O  . Tobacco abuse    quit smoking 08/2013    Past Surgical History:  Procedure Laterality Date  . ABDOMINAL HYSTERECTOMY    . APPENDECTOMY     with hysterectomy  . childbirth     x4  . CHOLECYSTECTOMY  2001  . EYE SURGERY Bilateral 2015   cataract surgery with lens implant  . NEPHRECTOMY  1992   complex cystic mass - nephrectomy right  . ORIF ANKLE FRACTURE Right 07/02/2014   Procedure: OPEN REDUCTION INTERNAL FIXATION (ORIF) RIGHT ANKLE BIMALLOELAR FRACTURE;  Surgeon: Wylene Simmer, MD;  Location: [facility];  Service: Orthopedics;  Laterality: Right;  . OTHER SURGICAL HISTORY     hysterectomy, R ovary remains  . SHOULDER SURGERY     bone spurs left     Current Outpatient Medications on File Prior to Visit  Medication Sig Dispense Refill  . acetaminophen (TYLENOL) 500 MG tablet Take 500 mg by mouth every 6 (six) hours as needed.    [name] allopurinol (ZYLOPRIM) 300 MG tablet Take 1 tablet (300 mg total) by mouth 2 (two) times daily. 180 tablet 3  .  AMBULATORY NON FORMULARY MEDICATION Rolling walker with a seat 1 Units 0  . aspirin 81 MG tablet Take 81 mg by mouth daily.    [name] CALCIUM-MAGNESIUM-ZINC PO Take by mouth.    . clonazePAM (KLONOPIN) 0.5 MG tablet TAKE 1 TABLET(0.5 MG) BY MOUTH AT BEDTIME AS NEEDED 90 tablet 1  . diazepam (VALIUM) 10 MG tablet Take 1 tablet (10 mg total) by mouth every 6 (six) hours as needed (as needed for muscle spasms). 30 tablet 0  . diclofenac sodium (VOLTAREN) 1 % GEL Apply topically to affected area qid 100 g 2  . gabapentin (NEURONTIN) 300 MG capsule Take 1 capsule, orally, up to TID or as directed 270 capsule 1  . lisinopril (ZESTRIL) 2.5 MG tablet TAKE 1 TABLET EVERY DAY 90 tablet 0  . meloxicam (MOBIC) 15 MG tablet TAKE 1 TABLET BY MOUTH DAILY FOR 14 DAYS, THEN AS NEEDED 90 tablet 3  . nystatin cream (MYCOSTATIN) APPLY 1 APPLICATION TOPICALLY TO RASH IN GROIN TWICE DAILY FOR UP TO 2 WEEKS MAXIMUM 90 g 2  . OVER THE COUNTER MEDICATION Cherry extract bid    . OVER THE COUNTER MEDICATION B-12 one daily    . sertraline (ZOLOFT) 50 MG tablet Take 1 tablet (50 mg total) by mouth daily. 90 tablet 3  . simvastatin (ZOCOR) 20 MG tablet TAKE 1 TABLET AT BEDTIME 90 tablet 0  . Sodium Bicarbonate (NICE PURE BAKING SODA) POWD by Does not apply route.    . traMADol (ULTRAM) 50 MG tablet Take 1 tablet (50 mg total)  by mouth every 6 (six) hours as needed for moderate pain or severe pain. 60 tablet 0  . triamcinolone cream (KENALOG) 0.1 % Apply 1 application topically 2 (two) times daily. For 7-10 days for recurring eczema issues 80 g 1  . Vitamin D, Ergocalciferol, (DRISDOL) 1.25 MG (50000 UT) CAPS capsule Take 1 capsule (50,000 Units total) by mouth every 7 (seven) days. 12 capsule 0   No current facility-administered medications on file prior to visit.    Allergies  Allergen Reactions  . Epinephrine Palpitations  . Codeine Nausea Only  . Penicillins Nausea Only  . Prednisone Cough    Physical exam:  Today's  Vitals   12/17/19 1511  BP: 128/84  Pulse: 89  Temp: 97.6 F (36.4 C)  Weight: 249 lb (112.9 kg)  Height: 5\' 7"  (1.702 m)   Body mass index is 39 kg/m.   Wt Readings from Last 3 Encounters:  12/17/19 249 lb (112.9 kg)  12/11/19 247 lb (112 kg)  12/09/19 245 lb (111.1 kg)     Ht Readings from Last 3 Encounters:  12/17/19 5\' 7"  (1.702 m)  12/11/19 5\' 8"  (1.727 m)  12/09/19 5\' 8"  (1.727 m)      General: The patient is awake, alert and appears not in acute distress. The patient is well groomed. Head: Normocephalic, atraumatic. Neck is supple. Mallampati 4,  neck circumference:16.5  inches .  Nasal airflow congested .  Retrognathia is not seen.  Dental status:  Cardiovascular:  Regular rate and cardiac rhythm by pulse,  without distended neck veins. Respiratory: Lungs are clear to auscultation.  Skin:  Without evidence of ankle edema, or rash. Trunk: The patient's posture is erect.   Neurologic exam : The patient is awake and alert, oriented to place and time.   Memory subjective described as intact.  Attention span & concentration ability appears normal.  Speech is fluent,  without  dysarthria, dysphonia or aphasia.  Mood and affect are appropriate.   Cranial nerves: no loss of smell or taste reported  Pupils are equal and briskly reactive to light.  Extraocular movements in vertical and horizontal planes were intact and without nystagmus. No Diplopia. Visual fields by finger perimetry are intact. Hearing was intact to soft voice and finger rubbing on the right- Left ear tinnitus   Facial sensation intact to fine touch.  Facial motor strength is symmetric and tongue and uvula move midline.  Neck ROM : rotation, tilt and flexion extension were normal for age and shoulder shrug was symmetrical.    Motor exam:  Symmetric bulk, tone and ROM.   Normal tone without cog wheeling, symmetric grip strength .  [name]  Deep tendon reflexes: in the  upper and lower extremities are  symmetric and intact.  Babinski response was deferred.     After spending a total time of  35 minutes face to face and additional time for physical and neurologic examination, review of laboratory studies,  personal review of imaging studies, reports and results of other testing and review of referral information / records as far as provided in visit, I have established the following assessments:  1) snoring  2) overcoming anxiety/ depression.  3) obesity   My Plan is to proceed with:  1) HST or attended sleep study     I would like to thank Angie Olp, MD  for allowing me to meet with and to take care of this pleasant patient.   In short, Jahkayla Barrero is presenting  with snoring.  I plan to follow up either personally or through our NP within 2-3 month.   CC: I will share my notes with PCP   Electronically signed by: Larey Seat, MD 12/17/2019 3:27 PM  [facility] Board certified by The [employer] of Sleep Medicine and Diplomate of the [employer] of Sleep Medicine. Board certified In Neurology through the [place], Fellow of the [employer] of Neurology. Medical Director of [facility].

## 2019-12-18 NOTE — Progress Notes (Signed)
Chief Complaint:   OBESITY Angie Velasquez is here to discuss her progress with her obesity treatment plan along with follow-up of her obesity related diagnoses. Angie Velasquez is on the Category 2 Plan and states she is following her eating plan approximately 60-70% of the time. Angie Velasquez states she is exercising 0 minutes 0 times per week.  Today's visit was #: 4 Starting weight: 249 lbs Starting date: 09/18/2017 Today's weight: 247 lbs Today's date: 12/11/2019 Total lbs lost to date: 2 Total lbs lost since last in-office visit: 0  Interim History: Angie Velasquez states that she has not cooked at home in 3 weeks due to moving. She is eating at [place] often.  Subjective:   Vitamin D deficiency. Angie Velasquez is on prescription Vitamin D weekly. No nausea, vomiting, or muscle weakness. Last Vitamin D level 42.1 on 08/28/2019.  Other hyperlipidemia. Angie Velasquez has hyperlipidemia and has been trying to improve her cholesterol levels with intensive lifestyle modification including a low saturated fat diet, exercise and weight loss. She denies any chest pain, claudication or myalgias. Angie Velasquez is on Zocor.   Lab Results  Component Value Date   ALT 26 08/28/2019   AST 25 08/28/2019   ALKPHOS 117 08/28/2019   BILITOT 0.3 08/28/2019   Lab Results  Component Value Date   CHOL 183 08/28/2019   HDL 56 08/28/2019   LDLCALC 88 08/28/2019   LDLDIRECT 77.0 01/18/2017   TRIG 236 (H) 08/28/2019   CHOLHDL 2 01/18/2017   Assessment/Plan:   Vitamin D deficiency. Low Vitamin D level contributes to fatigue and are associated with obesity, breast, and colon cancer. She agrees to continue to take prescription Vitamin D, Ergocalciferol, (DRISDOL) 1.25 MG (50000 UT) CAPS capsule every week #12 with 0 refiills and will follow-up for routine testing of Vitamin D, at least 2-3 times per year to avoid over-replacement. Will continue to monitor.  Other hyperlipidemia. Cardiovascular risk and specific lipid/LDL  goals reviewed.  We discussed several lifestyle modifications today and Angie Velasquez will continue to work on diet, exercise and weight loss efforts. Orders and follow up as documented in patient record. Angie Velasquez will continue with medications and weight loss. Will continue to monitor.  Counseling Intensive lifestyle modifications are the first line treatment for this issue. . Dietary changes: Increase soluble fiber. Decrease simple carbohydrates. . Exercise changes: Moderate to vigorous-intensity aerobic activity 150 minutes per week if tolerated. . Lipid-lowering medications: see documented in medical record.  Class 2 severe obesity with serious comorbidity and body mass index (BMI) of 37.0 to 37.9 in adult, unspecified obesity type ([place]).  Alyzea is currently in the action stage of change. As such, her goal is to continue with weight loss efforts. She has agreed to on the Category 2 Plan.   We discussed the following exercise goals today: Older adults should follow the adult guidelines. When older adults cannot meet the adult guidelines, they should be as physically active as their abilities and conditions will allow.  Older adults should do exercises that maintain or improve balance if they are at risk of falling.   We discussed the following behavioral modification strategies today: increasing lean protein intake and no skipping meals.  Angie Velasquez has agreed to follow-up with our clinic in 2-3 weeks. She was informed of the importance of frequent follow-up visits to maximize her success with intensive lifestyle modifications for her multiple health conditions.   Objective:   Blood pressure 118/67, pulse 90, temperature 98.2 F (36.8 C), temperature source Oral,  height 5\' 8"  (1.727 m), weight 247 lb (112 kg), SpO2 97 %. Body mass index is 37.56 kg/m.  General: Cooperative, alert, well developed, in no acute distress. HEENT: Conjunctivae and lids unremarkable. Neck: No thyromegaly.    Cardiovascular: Regular rhythm.  Lungs: Normal work of breathing. Extremities: No edema.  Neurologic: No focal deficits.   Lab Results  Component Value Date   CREATININE 1.14 (H) 08/28/2019   BUN 14 08/28/2019   NA 144 08/28/2019   K 3.9 08/28/2019   CL 105 08/28/2019   CO2 22 08/28/2019   Lab Results  Component Value Date   ALT 26 08/28/2019   AST 25 08/28/2019   ALKPHOS 117 08/28/2019   BILITOT 0.3 08/28/2019   Lab Results  Component Value Date   HGBA1C 5.8 (H) 08/28/2019   HGBA1C 5.7 (H) 01/09/2019   HGBA1C 5.8 (H) 07/25/2018   HGBA1C 5.7 (H) 09/18/2017   HGBA1C (H) 04/15/2010    5.9 (NOTE)                                                                       According to the ADA Clinical Practice Recommendations for 2011, when HbA1c is used as a screening test:   >=6.5%   Diagnostic of Diabetes Mellitus           (if abnormal result  is confirmed)  5.7-6.4%   Increased risk of developing Diabetes Mellitus  References:Diagnosis and Classification of Diabetes Mellitus,Diabetes [id number] 1):S62-S69 and Standards of Medical Care in         Diabetes - 2011,Diabetes Care,2011,34  (Suppl 1):S11-S61.   Lab Results  Component Value Date   INSULIN 25.2 (H) 08/28/2019   INSULIN 18.6 01/09/2019   INSULIN 22.0 07/25/2018   INSULIN 25.3 (H) 09/18/2017   Lab Results  Component Value Date   TSH 3.430 09/18/2017   Lab Results  Component Value Date   CHOL 183 08/28/2019   HDL 56 08/28/2019   LDLCALC 88 08/28/2019   LDLDIRECT 77.0 01/18/2017   TRIG 236 (H) 08/28/2019   CHOLHDL 2 01/18/2017   Lab Results  Component Value Date   WBC 7.1 08/28/2019   HGB 13.4 08/28/2019   HCT 40.3 08/28/2019   MCV 90 08/28/2019   PLT 242 08/28/2019   No results found for: IRON, TIBC, FERRITIN  Obesity Behavioral Intervention Documentation for Insurance:   Approximately 15 minutes were spent on the discussion below.  ASK: We discussed the diagnosis of obesity with Angie Velasquez today and Angie Velasquez agreed to give [name] permission to discuss obesity behavioral modification therapy today.  ASSESS: Angie Velasquez has the diagnosis of obesity and her BMI today is 37.7. Angie Velasquez is in the action stage of change.   ADVISE: [name] was educated on the multiple health risks of obesity as well as the benefit of weight loss to improve her health. She was advised of the need for long term treatment and the importance of lifestyle modifications to improve her current health and to decrease her risk of future health problems.  AGREE: Multiple dietary modification options and treatment options were discussed and Sheneta agreed to follow the recommendations documented in the above note.  ARRANGE: Marienne was educated on the importance of frequent visits to  treat obesity as outlined per CMS and USPSTF guidelines and agreed to schedule her next follow up appointment today.  Attestation Statements:   Reviewed by clinician on day of visit: allergies, medications, problem list, medical history, surgical history, family history, social history, and previous encounter notes.  IMichaelene Song, am acting as transcriptionist for Abby Potash, PA-C  I have reviewed the above documentation for accuracy and completeness, and I agree with the above. Abby Potash, PA-C

## 2019-12-20 ENCOUNTER — Other Ambulatory Visit: Payer: Self-pay | Admitting: Family Medicine

## 2019-12-31 ENCOUNTER — Telehealth: Payer: Self-pay | Admitting: Family Medicine

## 2019-12-31 ENCOUNTER — Ambulatory Visit (INDEPENDENT_AMBULATORY_CARE_PROVIDER_SITE_OTHER): Payer: Medicare HMO | Admitting: Neurology

## 2019-12-31 ENCOUNTER — Encounter (INDEPENDENT_AMBULATORY_CARE_PROVIDER_SITE_OTHER): Payer: Self-pay | Admitting: Physician Assistant

## 2019-12-31 ENCOUNTER — Other Ambulatory Visit: Payer: Self-pay

## 2019-12-31 ENCOUNTER — Ambulatory Visit (INDEPENDENT_AMBULATORY_CARE_PROVIDER_SITE_OTHER): Payer: Medicare HMO | Admitting: Physician Assistant

## 2019-12-31 VITALS — BP 101/67 | HR 82 | Temp 97.6°F | Ht 68.0 in | Wt 245.0 lb

## 2019-12-31 DIAGNOSIS — E7849 Other hyperlipidemia: Secondary | ICD-10-CM

## 2019-12-31 DIAGNOSIS — Z6839 Body mass index (BMI) 39.0-39.9, adult: Secondary | ICD-10-CM

## 2019-12-31 DIAGNOSIS — Z6837 Body mass index (BMI) 37.0-37.9, adult: Secondary | ICD-10-CM

## 2019-12-31 DIAGNOSIS — R7303 Prediabetes: Secondary | ICD-10-CM | POA: Diagnosis not present

## 2019-12-31 DIAGNOSIS — R0683 Snoring: Secondary | ICD-10-CM

## 2019-12-31 DIAGNOSIS — E559 Vitamin D deficiency, unspecified: Secondary | ICD-10-CM | POA: Diagnosis not present

## 2019-12-31 DIAGNOSIS — G4733 Obstructive sleep apnea (adult) (pediatric): Secondary | ICD-10-CM

## 2019-12-31 DIAGNOSIS — K219 Gastro-esophageal reflux disease without esophagitis: Secondary | ICD-10-CM

## 2019-12-31 DIAGNOSIS — R0789 Other chest pain: Secondary | ICD-10-CM

## 2019-12-31 DIAGNOSIS — S82841S Displaced bimalleolar fracture of right lower leg, sequela: Secondary | ICD-10-CM

## 2019-12-31 DIAGNOSIS — E6609 Other obesity due to excess calories: Secondary | ICD-10-CM

## 2019-12-31 DIAGNOSIS — J438 Other emphysema: Secondary | ICD-10-CM

## 2019-12-31 NOTE — Telephone Encounter (Signed)
I called the patient to schedule AWV-I with Angie Velasquez.  She said that she's going out of town for about 6 weeks and will call us when she returns.

## 2019-12-31 NOTE — Progress Notes (Signed)
Chief Complaint:   OBESITY Angie Velasquez is here to discuss her progress with her obesity treatment plan along with follow-up of her obesity related diagnoses. Angie Velasquez is on the Category 2 Plan and states she is following her eating plan approximately 60% of the time. Angie Velasquez states she is exercising 0 minutes 0 times per week.  Today's visit was #: 7 Starting weight: 249 lbs Starting date: 09/18/2017 Today's weight: 245 lbs Today's date: 12/31/2019 Total lbs lost to date: 4 Total lbs lost since last in-office visit: 2  Interim History: Angie Velasquez has been under some personal stress recently. She states that she gets up late, eats breakfast, sometimes skips lunch, and then eats dinner. She states she is having a sleep study tonight.  Subjective:   Other hyperlipidemia. Angie Velasquez is on Zocor. No chest pain or myalgias. She is due for labs. She is not exercising.  Lab Results  Component Value Date   CHOL 183 08/28/2019   HDL 56 08/28/2019   LDLCALC 88 08/28/2019   LDLDIRECT 77.0 01/18/2017   TRIG 236 (H) 08/28/2019   CHOLHDL 2 01/18/2017   Lab Results  Component Value Date   ALT 26 08/28/2019   AST 25 08/28/2019   ALKPHOS 117 08/28/2019   BILITOT 0.3 08/28/2019   The 10-year ASCVD risk score Mikey Bussing DC Jr., et al., 2013) is: 9.7%   Values used to calculate the score:     Age: 74 years     Sex: Female     Is Non-Hispanic African American: No     Diabetic: No     Tobacco smoker: No     Systolic Blood Pressure: 99991111 mmHg     Is BP treated: Yes     HDL Cholesterol: 56 mg/dL     Total Cholesterol: 183 mg/dL  Vitamin D deficiency. Last Vitamin D level was 42.1 on 08/28/2019. Angie Velasquez is on weekly Vitamin D. No nausea, vomiting, or muscle weakness.  Prediabetes. Angie Velasquez has a diagnosis of prediabetes based on her elevated HgA1c and was informed this puts her at greater risk of developing diabetes. She continues to work on diet and exercise to decrease her risk of  diabetes. No polyphagia. She is on no medications. She is due for labs.  Lab Results  Component Value Date   HGBA1C 5.8 (H) 08/28/2019   Lab Results  Component Value Date   INSULIN 25.2 (H) 08/28/2019   INSULIN 18.6 01/09/2019   INSULIN 22.0 07/25/2018   INSULIN 25.3 (H) 09/18/2017   Assessment/Plan:   Other hyperlipidemia. Cardiovascular risk and specific lipid/LDL goals reviewed.  We discussed several lifestyle modifications today and Valari will continue to work on diet, exercise and weight loss efforts. Orders and follow up as documented in patient record. Comprehensive metabolic panel, Hemoglobin A1c, Insulin, random, Lipid Panel with LDL/HDL Ratio labs ordered.  Counseling Intensive lifestyle modifications are the first line treatment for this issue. . Dietary changes: Increase soluble fiber. Decrease simple carbohydrates. . Exercise changes: Moderate to vigorous-intensity aerobic activity 150 minutes per week if tolerated. . Lipid-lowering medications: see documented in medical record.   Vitamin D deficiency. Low Vitamin D level contributes to fatigue and are associated with obesity, breast, and colon cancer. She agrees to continue to take Vitamin D and will have routine testing of VITAMIN D 25 Hydroxy (Vit-D Deficiency, Fractures) today.     Prediabetes. Angie Velasquez will continue to work on weight loss, exercise, and decreasing simple carbohydrates to help decrease the risk of diabetes.  Hemoglobin A1c, Insulin, random labs ordered today.  Class 2 severe obesity with serious comorbidity and body mass index (BMI) of 37.0 to 37.9 in adult, unspecified obesity type (Angie Velasquez).  Angie Velasquez is currently in the action stage of change. As such, her goal is to continue with weight loss efforts. She has agreed to the Category 2 Plan.   Exercise goals: Older adults should follow the adult guidelines. When older adults cannot meet the adult guidelines, they should be as physically active as their  abilities and conditions will allow.   Behavioral modification strategies: increasing lean protein intake and no skipping meals.  Angie Velasquez has agreed to follow-up with our clinic in 6 weeks [facility] She was informed of the importance of frequent follow-up visits to maximize her success with intensive lifestyle modifications for her multiple health conditions.   Angie Velasquez was informed we would discuss her lab results at her next visit unless there is a critical issue that needs to be addressed sooner. Angie Velasquez agreed to keep her next visit at the agreed upon time to discuss these results.  Objective:   Blood pressure 101/67, pulse 82, temperature 97.6 F (36.4 C), temperature source Oral, height 5\' 8"  (1.727 m), weight 245 lb (111.1 kg), SpO2 98 %. Body mass index is 37.25 kg/m.  General: Cooperative, alert, well developed, in no acute distress. HEENT: Conjunctivae and lids unremarkable. Cardiovascular: Regular rhythm.  Lungs: Normal work of breathing. Neurologic: No focal deficits.   Lab Results  Component Value Date   CREATININE 1.14 (H) 08/28/2019   BUN 14 08/28/2019   NA 144 08/28/2019   K 3.9 08/28/2019   CL 105 08/28/2019   CO2 22 08/28/2019   Lab Results  Component Value Date   ALT 26 08/28/2019   AST 25 08/28/2019   ALKPHOS 117 08/28/2019   BILITOT 0.3 08/28/2019   Lab Results  Component Value Date   HGBA1C 5.8 (H) 08/28/2019   HGBA1C 5.7 (H) 01/09/2019   HGBA1C 5.8 (H) 07/25/2018   HGBA1C 5.7 (H) 09/18/2017   HGBA1C (H) 04/15/2010    5.9 (NOTE)                                                                       According to the ADA Clinical Practice Recommendations for 2011, when HbA1c is used as a screening test:   >=6.5%   Diagnostic of Diabetes Mellitus           (if abnormal result  is confirmed)  5.7-6.4%   Increased risk of developing Diabetes Mellitus  References:Diagnosis and Classification of Diabetes Mellitus,Diabetes [id number] 1):S62-S69 and  Standards of Medical Care in         Diabetes - 2011,Diabetes Care,2011,34  (Suppl 1):S11-S61.   Lab Results  Component Value Date   INSULIN 25.2 (H) 08/28/2019   INSULIN 18.6 01/09/2019   INSULIN 22.0 07/25/2018   INSULIN 25.3 (H) 09/18/2017   Lab Results  Component Value Date   TSH 3.430 09/18/2017   Lab Results  Component Value Date   CHOL 183 08/28/2019   HDL 56 08/28/2019   LDLCALC 88 08/28/2019   LDLDIRECT 77.0 01/18/2017   TRIG 236 (H) 08/28/2019   CHOLHDL 2 01/18/2017   Lab Results  Component Value Date  WBC 7.1 08/28/2019   HGB 13.4 08/28/2019   HCT 40.3 08/28/2019   MCV 90 08/28/2019   PLT 242 08/28/2019   No results found for: IRON, TIBC, FERRITIN  Obesity Behavioral Intervention Documentation for Insurance:   Approximately 15 minutes were spent on the discussion below.  ASK: We discussed the diagnosis of obesity with Angie Velasquez today and Angie Velasquez agreed to give [name] permission to discuss obesity behavioral modification therapy today.  ASSESS: Angie Velasquez has the diagnosis of obesity and her BMI today is 37.3. Rahmah is in the action stage of change.   ADVISE: Angie Velasquez was educated on the multiple health risks of obesity as well as the benefit of weight loss to improve her health. She was advised of the need for long term treatment and the importance of lifestyle modifications to improve her current health and to decrease her risk of future health problems.  AGREE: Multiple dietary modification options and treatment options were discussed and Angie Velasquez agreed to follow the recommendations documented in the above note.  ARRANGE: Yobana was educated on the importance of frequent visits to treat obesity as outlined per CMS and USPSTF guidelines and agreed to schedule her next follow up appointment today.  Attestation Statements:   Reviewed by clinician on day of visit: allergies, medications, problem list, medical history, surgical history, family history, social  history, and previous encounter notes.  Angie Velasquez, am acting as transcriptionist for Angie Potash, PA-C   I have reviewed the above documentation for accuracy and completeness, and I agree with the above. Angie Potash, PA-C

## 2020-01-01 LAB — COMPREHENSIVE METABOLIC PANEL
ALT: 21 IU/L (ref 0–32)
AST: 22 IU/L (ref 0–40)
Albumin/Globulin Ratio: 2.2 (ref 1.2–2.2)
Albumin: 4.3 g/dL (ref 3.7–4.7)
Alkaline Phosphatase: 112 IU/L (ref 39–117)
BUN/Creatinine Ratio: 12 (ref 12–28)
BUN: 13 mg/dL (ref 8–27)
Bilirubin Total: 0.2 mg/dL (ref 0.0–1.2)
CO2: 23 mmol/L (ref 20–29)
Calcium: 9.2 mg/dL (ref 8.7–10.3)
Chloride: 104 mmol/L (ref 96–106)
Creatinine, Ser: 1.06 mg/dL — ABNORMAL HIGH (ref 0.57–1.00)
GFR calc Af Amer: 61 mL/min/{1.73_m2} (ref >59)
GFR calc non Af Amer: 53 mL/min/{1.73_m2} — ABNORMAL LOW (ref >59)
Globulin, Total: 2 g/dL (ref 1.5–4.5)
Glucose: 99 mg/dL (ref 65–99)
Potassium: 4.1 mmol/L (ref 3.5–5.2)
Sodium: 141 mmol/L (ref 134–144)
Total Protein: 6.3 g/dL (ref 6.0–8.5)

## 2020-01-01 LAB — LIPID PANEL WITH LDL/HDL RATIO
Cholesterol, Total: 210 mg/dL — ABNORMAL HIGH (ref 100–199)
HDL: 66 mg/dL (ref >39)
LDL Chol Calc (NIH): 113 mg/dL — ABNORMAL HIGH (ref 0–99)
LDL/HDL Ratio: 1.7 ratio (ref 0.0–3.2)
Triglycerides: 177 mg/dL — ABNORMAL HIGH (ref 0–149)
VLDL Cholesterol Cal: 31 mg/dL (ref 5–40)

## 2020-01-01 LAB — VITAMIN D 25 HYDROXY (VIT D DEFICIENCY, FRACTURES): Vit D, 25-Hydroxy: 53.1 ng/mL (ref 30.0–100.0)

## 2020-01-01 LAB — HEMOGLOBIN A1C
Est. average glucose Bld gHb Est-mCnc: 123 mg/dL
Hgb A1c MFr Bld: 5.9 % — ABNORMAL HIGH (ref 4.8–5.6)

## 2020-01-01 LAB — INSULIN, RANDOM: INSULIN: 23 u[IU]/mL (ref 2.6–24.9)

## 2020-01-06 ENCOUNTER — Encounter: Payer: Self-pay | Admitting: Neurology

## 2020-01-06 ENCOUNTER — Other Ambulatory Visit: Payer: Self-pay | Admitting: Family Medicine

## 2020-01-06 DIAGNOSIS — R0683 Snoring: Secondary | ICD-10-CM | POA: Insufficient documentation

## 2020-01-06 NOTE — Procedures (Signed)
Patient Information     First Name: Savonna Perra Last Name: [name] ID: [id number]  Birth Date: Apr 20, 1946 Age: 74 Gender: Female  Referring Provider: Marin Olp, MD BMI: 39.1 (W=249 lb, H=5' 7'')  Neck Circ.:  16 '' Epworth:  7/24   Sleep Study Information    Study Date: Dec 31, 2019 S/H/A Version: 001.001.001.001 / 4.1.1528 / 58  History:    12-17-2019 Dorcus Alejandre is a right-handed Caucasian female with a medical history of Anxiety, osteo-Arthritis, Morbid Obesity, Fracture of pelvis ([place]), GERD (gastroesophageal reflux disease), non -cardiac chest pain, Hyperlipidemia, INSOMNIA (10/14/2008), Joint pain, Kidney proteinuria, Low blood sugar, Palpitations, and remote Tobacco use. GERD has been affecting her sleep. She reportedly snores and has been anxious and stressed about a recent separation from her second husband, she is planning to move out of state.        Summary & Diagnosis:      Mrs. Renaldo Harrison HST clearly indicated the presence of OSA- severe obstructive sleep apnea, with an AHI ( Apnea Hypopnea Index) of  36.1/h and REM accentuation to an AHI of 46.9/h. Normal heart rate variability was seen and no prolonged hypoxia was recorded.  There were many brief events of hypoxemia, the ODI was 23.6/h, with a total of 139 events, SpO2 nadir was 76%.  Recommendations:     This type and degree of apnea will require CPAP ( continuous Positive Airway pressure ) therapy, an autotitration capable device will be ordered with heated humidity and a pressure window from 6 through 16 cm water ,1 cm EPR, and interface of patient's choice.   Interpreting Physician: Larey Seat, MD              Sleep Summary    Oxygen Saturation Statistics     Start Study Time: End Study Time: Total Recording Time:     10:43:33 PM 7:02:35 AM      8 h, 19 min  Total Sleep Time % REM of Sleep Time:  6 h, 54 min  20.3    Mean: 94 Minimum: 76 Maximum: 99  Mean of Desaturations Nadirs  (%):   90  Oxygen Desaturation. %:   4-9 10-20 >20 Total  Events Number Total   139  20 86.9 12.5  1 0.6  160 100.0  Oxygen Saturation: <90 <=88 <85 <80 <70  Duration (minutes): Sleep % 11.3 2.7  8.8 3.5  2.1 0.8 0.5 0.1 0.0 0.0     Respiratory Indices      Total Events REM NREM All Night  pRDI:  248  pAHI:  245 ODI:  160  pAHIc:  7  % CSR: 0.0 47.6 46.9 41.0 2.9 33.7 33.3 19.2 0.6 36.5 36.1 23.6 1.0       Pulse Rate Statistics during Sleep (BPM)      Mean: 69 Minimum: 50 Maximum:  97    Indices are calculated using technically valid sleep time of 6 h, 47 min. pRDI/pAHI are calculated using oxi desaturations ? 3%  Body Position Statistics  Position Supine Prone Right Left Non-Supine  Sleep (min) 201.0 179.5 19.5 14.0 213.0  Sleep % 48.6 43.4 4.7 3.4 51.4  pRDI 51.5 21.6 26.2 26.6 22.4  pAHI 50.9 21.3 26.2 26.6 22.1  ODI 32.7 13.9 26.2 13.3 14.9     Snoring Statistics Snoring Level (dB) >40 >50 >60 >70 >80 >Threshold (45)  Sleep (min) 404.8 190.1 96.6 0.7 0.0 268.8  Sleep % 97.8 45.9 23.3 0.2 0.0  64.9    Mean: 52 dB Sleep Stages Chart                                                       pAHI=36.1                                                                   Mild              Moderate                    Severe                                                 5              15                    30

## 2020-01-06 NOTE — Progress Notes (Signed)
Summary & Diagnosis:     [name] HST clearly indicated the presence of OSA- severe  obstructive sleep apnea, with an AHI ( Apnea Hypopnea Index) of   36.1/h and REM accentuation to an AHI of 46.9/h.  Normal heart rate variability was seen and no prolonged hypoxia  was recorded. There were many brief events of hypoxemia, the ODI  was 23.6/h, with a total of 139 events, SpO2 nadir was 76%.  Recommendations:    This type and degree of apnea will require CPAP ( continuous  Positive Airway pressure ) therapy, an autotitration capable  device will be ordered with heated humidity and a pressure window  from 6 through 16 cm water ,1 cm EPR, and interface of patient's  choice.   Interpreting Physician: Larey Seat, MD

## 2020-01-06 NOTE — Addendum Note (Signed)
Addended by: Larey Seat on: 01/06/2020 05:59 PM   Modules accepted: Orders

## 2020-01-07 ENCOUNTER — Telehealth: Payer: Self-pay | Admitting: Neurology

## 2020-01-07 NOTE — Telephone Encounter (Signed)
I called pt. I advised pt that Dr. Brett Fairy reviewed their sleep study results and found that pt has severe sleep apnea. Dr. Brett Fairy recommends that pt should start auto CPAP. I reviewed PAP compliance expectations with the pt. Pt is agreeable to starting a CPAP. I advised pt that an order will be sent to a DME, [facility], and [facility] will call the pt within about one week after they file with the pt's insurance. [facility] will show the pt how to use the machine, fit for masks, and troubleshoot the CPAP if needed. A follow up appt was made for insurance purposes with Ward Givens, NP on May 6,2021 at 1 pm. Pt verbalized understanding to arrive 15 minutes early and bring their CPAP. A letter with all of this information in it will be mailed to the pt as a reminder. I verified with the pt that the address we have on file is correct. Pt verbalized understanding of results. Pt had no questions at this time but was encouraged to call back if questions arise. I have sent the order to [facility] and have received confirmation that they have received the order.

## 2020-01-07 NOTE — Telephone Encounter (Signed)
-----   Message from Larey Seat, MD sent at 01/06/2020  5:59 PM EST ----- Summary & Diagnosis:     Angie Velasquez HST clearly indicated the presence of OSA- severe  obstructive sleep apnea, with an AHI ( Apnea Hypopnea Index) of   36.1/h and REM accentuation to an AHI of 46.9/h.  Normal heart rate variability was seen and no prolonged hypoxia  was recorded. There were many brief events of hypoxemia, the ODI  was 23.6/h, with a total of 139 events, SpO2 nadir was 76%.  Recommendations:    This type and degree of apnea will require CPAP ( continuous  Positive Airway pressure ) therapy, an autotitration capable  device will be ordered with heated humidity and a pressure window  from 6 through 16 cm water ,1 cm EPR, and interface of patient's  choice.   Interpreting Physician: Larey Seat, MD

## 2020-02-09 ENCOUNTER — Encounter: Payer: Self-pay | Admitting: Family Medicine

## 2020-02-11 ENCOUNTER — Telehealth (INDEPENDENT_AMBULATORY_CARE_PROVIDER_SITE_OTHER): Payer: Medicare HMO | Admitting: Physician Assistant

## 2020-02-11 ENCOUNTER — Encounter (INDEPENDENT_AMBULATORY_CARE_PROVIDER_SITE_OTHER): Payer: Self-pay | Admitting: Physician Assistant

## 2020-02-11 ENCOUNTER — Other Ambulatory Visit: Payer: Self-pay

## 2020-02-11 DIAGNOSIS — E7849 Other hyperlipidemia: Secondary | ICD-10-CM | POA: Diagnosis not present

## 2020-02-11 DIAGNOSIS — Z6837 Body mass index (BMI) 37.0-37.9, adult: Secondary | ICD-10-CM

## 2020-02-11 DIAGNOSIS — E559 Vitamin D deficiency, unspecified: Secondary | ICD-10-CM

## 2020-02-11 DIAGNOSIS — R7303 Prediabetes: Secondary | ICD-10-CM | POA: Diagnosis not present

## 2020-02-11 NOTE — Progress Notes (Signed)
TeleHealth Visit:  Due to the COVID-19 pandemic, this visit was completed with telemedicine (audio/video) technology to reduce patient and provider exposure as well as to preserve personal protective equipment.   Angie Velasquez has verbally consented to this TeleHealth visit. The patient is located at [place], the provider is located at the [facility] office. The participants in this visit include the listed provider and patient. The visit was conducted today via Webex.  Chief Complaint: OBESITY Angie Velasquez is here to discuss her progress with her obesity treatment plan along with follow-up of her obesity related diagnoses. Angie Velasquez is on the Category 2 Plan and states she is following her eating plan approximately 50% of the time. Angie Velasquez states she is walking some.   Today's visit was #: 40 Starting weight: 249 lbs Starting date: 09/18/2017  Interim History: Angie Velasquez states that she is now living in [place] full-time and is doing well. She is not eating enough protein throughout the day.  Subjective:   Vitamin D deficiency. Angie Velasquez is on Vitamin D weekly. No nausea, vomiting, or muscle weakness. Last Vitamin D level was at goal - 53.1 on 12/31/2019.  Prediabetes. Angie Velasquez has a diagnosis of prediabetes based on her elevated HgA1c and was informed this puts her at greater risk of developing diabetes. She continues to work on diet and exercise to decrease her risk of diabetes. She denies nausea or hypoglycemia. Angie Velasquez is on no medications. No polyphagia. She is walking some during the week.  Lab Results  Component Value Date   HGBA1C 5.9 (H) 12/31/2019   Lab Results  Component Value Date   INSULIN 23.0 12/31/2019   INSULIN 25.2 (H) 08/28/2019   INSULIN 18.6 01/09/2019   INSULIN 22.0 07/25/2018   INSULIN 25.3 (H) 09/18/2017   Other hyperlipidemia. Angie Velasquez is on Zocor. No myalgias. No chest pain. Last level was not at goal.  Lab Results  Component  Value Date   CHOL 210 (H) 12/31/2019   HDL 66 12/31/2019   LDLCALC 113 (H) 12/31/2019   LDLDIRECT 77.0 01/18/2017   TRIG 177 (H) 12/31/2019   CHOLHDL 2 01/18/2017   Lab Results  Component Value Date   ALT 21 12/31/2019   AST 22 12/31/2019   ALKPHOS 112 12/31/2019   BILITOT 0.2 12/31/2019   The 10-year ASCVD risk score Angie Bussing DC Jr., Angie al., Angie Velasquez) is: 9.8%   Values used to calculate the score:     Age: 74 years     Sex: Female     Is Non-Hispanic African American: No     Diabetic: No     Tobacco smoker: No     Systolic Blood Pressure: 99991111 mmHg     Is BP treated: Yes     HDL Cholesterol: 66 mg/dL     Total Cholesterol: 210 mg/dL  Assessment/Plan:   Vitamin D deficiency. Low Vitamin D level contributes to fatigue and are associated with obesity, breast, and colon cancer. She will change to Vitamin D 5,000 units daily (OTC) and will follow-up for routine testing of Vitamin D, at least 2-3 times per year to avoid over-replacement.  Prediabetes. Angie Velasquez will continue to work on weight loss, exercise, and decreasing simple carbohydrates to help decrease the risk of diabetes.   Other hyperlipidemia. Cardiovascular risk and specific lipid/LDL goals reviewed.  We discussed several lifestyle modifications today and Jahnvi will continue to work on diet, exercise and weight loss efforts. Orders and follow up as documented in patient record. Angie Velasquez will speak  with her PCP regarding her lipid panel and any possible changes to her medications.  Counseling Intensive lifestyle modifications are the first line treatment for this issue. . Dietary changes: Increase soluble fiber. Decrease simple carbohydrates. . Exercise changes: Moderate to vigorous-intensity aerobic activity 150 minutes per week if tolerated. . Lipid-lowering medications: see documented in medical record.  Class 2 severe obesity with serious comorbidity and body mass index (BMI) of 37.0 to 37.9 in adult, unspecified obesity  type ([place]).  Angie Velasquez is currently in the action stage of change. As such, her goal is to continue with weight loss efforts. She has agreed to the Category 2 Plan.   Exercise goals: Older adults should follow the adult guidelines. When older adults cannot meet the adult guidelines, they should be as physically active as their abilities and conditions will allow.   Behavioral modification strategies: increasing lean protein intake and decreasing simple carbohydrates.  Angie Velasquez has agreed to follow-up with our clinic in 2 weeks. She was informed of the importance of frequent follow-up visits to maximize her success with intensive lifestyle modifications for her multiple health conditions.  Objective:   VITALS: Per patient if applicable, see vitals. GENERAL: Alert and in no acute distress. CARDIOPULMONARY: No increased WOB. Speaking in clear sentences.  PSYCH: Pleasant and cooperative. Speech normal rate and rhythm. Affect is appropriate. Insight and judgement are appropriate. Attention is focused, linear, and appropriate.  NEURO: Oriented as arrived to appointment on time with no prompting.   Lab Results  Component Value Date   CREATININE 1.06 (H) 12/31/2019   BUN 13 12/31/2019   NA 141 12/31/2019   K 4.1 12/31/2019   CL 104 12/31/2019   CO2 23 12/31/2019   Lab Results  Component Value Date   ALT 21 12/31/2019   AST 22 12/31/2019   ALKPHOS 112 12/31/2019   BILITOT 0.2 12/31/2019   Lab Results  Component Value Date   HGBA1C 5.9 (H) 12/31/2019   HGBA1C 5.8 (H) 08/28/2019   HGBA1C 5.7 (H) 01/09/2019   HGBA1C 5.8 (H) 07/25/2018   HGBA1C 5.7 (H) 09/18/2017   Lab Results  Component Value Date   INSULIN 23.0 12/31/2019   INSULIN 25.2 (H) 08/28/2019   INSULIN 18.6 01/09/2019   INSULIN 22.0 07/25/2018   INSULIN 25.3 (H) 09/18/2017   Lab Results  Component Value Date   TSH 3.430 09/18/2017   Lab Results  Component Value Date   CHOL 210 (H) 12/31/2019   HDL 66 12/31/2019    LDLCALC 113 (H) 12/31/2019   LDLDIRECT 77.0 01/18/2017   TRIG 177 (H) 12/31/2019   CHOLHDL 2 01/18/2017   Lab Results  Component Value Date   WBC 7.1 08/28/2019   HGB 13.4 08/28/2019   HCT 40.3 08/28/2019   MCV 90 08/28/2019   PLT 242 08/28/2019   No results found for: IRON, TIBC, FERRITIN  Attestation Statements:   Reviewed by clinician on day of visit: allergies, medications, problem list, medical history, surgical history, family history, social history, and previous encounter notes.  IMichaelene Song, am acting as transcriptionist for Abby Potash, PA-C   I have reviewed the above documentation for accuracy and completeness, and I agree with the above. Abby Potash, PA-C

## 2020-02-16 ENCOUNTER — Telehealth (INDEPENDENT_AMBULATORY_CARE_PROVIDER_SITE_OTHER): Payer: Medicare HMO | Admitting: Physician Assistant

## 2020-02-25 ENCOUNTER — Encounter: Payer: Self-pay | Admitting: Family Medicine

## 2020-03-01 ENCOUNTER — Other Ambulatory Visit: Payer: Self-pay

## 2020-03-01 ENCOUNTER — Telehealth (INDEPENDENT_AMBULATORY_CARE_PROVIDER_SITE_OTHER): Payer: Medicare HMO | Admitting: Physician Assistant

## 2020-03-01 ENCOUNTER — Encounter (INDEPENDENT_AMBULATORY_CARE_PROVIDER_SITE_OTHER): Payer: Self-pay | Admitting: Physician Assistant

## 2020-03-01 DIAGNOSIS — E559 Vitamin D deficiency, unspecified: Secondary | ICD-10-CM

## 2020-03-01 DIAGNOSIS — Z6837 Body mass index (BMI) 37.0-37.9, adult: Secondary | ICD-10-CM

## 2020-03-01 MED ORDER — VITAMIN D (ERGOCALCIFEROL) 1.25 MG (50000 UNIT) PO CAPS: 50000.0000 [IU] | ORAL_CAPSULE | ORAL | 0 refills | Status: DC

## 2020-03-01 NOTE — Progress Notes (Signed)
TeleHealth Visit:  Due to the COVID-19 pandemic, this visit was completed with telemedicine (audio/video) technology to reduce patient and provider exposure as well as to preserve personal protective equipment.   Angie Velasquez has verbally consented to this TeleHealth visit. The patient is located at home in [place], the provider is located at the [facility] office. The participants in this visit include the listed provider and patient. The visit was conducted today via Webex.  Chief Complaint: OBESITY Angie Velasquez is here to discuss her progress with her obesity treatment plan along with follow-up of her obesity related diagnoses. Angie Velasquez is on the Category 2 Plan and states she is following her eating plan approximately 50t% of the time. Angie Velasquez states she is taking the stairs as much as she can.   Today's visit was #: 45 Starting weight: 249 lbs Starting date: 09/18/2017  Interim History: Angie Velasquez states that she has been eating more cottage cheese and it is satisfying for her.  Subjective:   Vitamin D deficiency. Angie Velasquez is on prescription Vitamin D weekly. No nausea, vomiting, or muscle weakness. Last Vitamin D 53.1 on 12/31/2019.  Assessment/Plan:   Vitamin D deficiency. Low Vitamin D level contributes to fatigue and are associated with obesity, breast, and colon cancer. She was given a refill on her Vitamin D, Ergocalciferol, (DRISDOL) 1.25 MG (50000 UNIT) CAPS capsule every week #12 with 0 refills and will follow-up for routine testing of Vitamin D, at least 2-3 times per year to avoid over-replacement.    Class 2 severe obesity with serious comorbidity and body mass index (BMI) of 37.0 to 37.9 in adult, unspecified obesity type ([place]).  Angie Velasquez is currently in the action stage of change. As such, her goal is to continue with weight loss efforts. She has agreed to the Category 2 Plan.   Exercise goals: Older adults should follow the adult guidelines.  When older adults cannot meet the adult guidelines, they should be as physically active as their abilities and conditions will allow.   Behavioral modification strategies: meal planning and cooking strategies and planning for success.  Angie Velasquez has agreed to follow-up with our clinic in 2 weeks. She was informed of the importance of frequent follow-up visits to maximize her success with intensive lifestyle modifications for her multiple health conditions.  Objective:   VITALS: Per patient if applicable, see vitals. GENERAL: Alert and in no acute distress. CARDIOPULMONARY: No increased WOB. Speaking in clear sentences.  PSYCH: Pleasant and cooperative. Speech normal rate and rhythm. Affect is appropriate. Insight and judgement are appropriate. Attention is focused, linear, and appropriate.  NEURO: Oriented as arrived to appointment on time with no prompting.   Lab Results  Component Value Date   CREATININE 1.06 (H) 12/31/2019   BUN 13 12/31/2019   NA 141 12/31/2019   K 4.1 12/31/2019   CL 104 12/31/2019   CO2 23 12/31/2019   Lab Results  Component Value Date   ALT 21 12/31/2019   AST 22 12/31/2019   ALKPHOS 112 12/31/2019   BILITOT 0.2 12/31/2019   Lab Results  Component Value Date   HGBA1C 5.9 (H) 12/31/2019   HGBA1C 5.8 (H) 08/28/2019   HGBA1C 5.7 (H) 01/09/2019   HGBA1C 5.8 (H) 07/25/2018   HGBA1C 5.7 (H) 09/18/2017   Lab Results  Component Value Date   INSULIN 23.0 12/31/2019   INSULIN 25.2 (H) 08/28/2019   INSULIN 18.6 01/09/2019   INSULIN 22.0 07/25/2018   INSULIN 25.3 (H) 09/18/2017  Lab Results  Component Value Date   TSH 3.430 09/18/2017   Lab Results  Component Value Date   CHOL 210 (H) 12/31/2019   HDL 66 12/31/2019   LDLCALC 113 (H) 12/31/2019   LDLDIRECT 77.0 01/18/2017   TRIG 177 (H) 12/31/2019   CHOLHDL 2 01/18/2017   Lab Results  Component Value Date   WBC 7.1 08/28/2019   HGB 13.4 08/28/2019   HCT 40.3 08/28/2019   MCV 90 08/28/2019    PLT 242 08/28/2019   No results found for: IRON, TIBC, FERRITIN  Attestation Statements:   Reviewed by clinician on day of visit: allergies, medications, problem list, medical history, surgical history, family history, social history, and previous encounter notes.  IMichaelene Song, am acting as transcriptionist for Abby Potash, PA-C   I have reviewed the above documentation for accuracy and completeness, and I agree with the above. Abby Potash, PA-C

## 2020-03-04 DIAGNOSIS — G4733 Obstructive sleep apnea (adult) (pediatric): Secondary | ICD-10-CM | POA: Diagnosis not present

## 2020-03-09 ENCOUNTER — Encounter: Payer: Self-pay | Admitting: Adult Health

## 2020-03-15 ENCOUNTER — Ambulatory Visit (INDEPENDENT_AMBULATORY_CARE_PROVIDER_SITE_OTHER): Payer: Medicare HMO | Admitting: Physician Assistant

## 2020-03-15 ENCOUNTER — Other Ambulatory Visit: Payer: Self-pay

## 2020-03-15 ENCOUNTER — Encounter (INDEPENDENT_AMBULATORY_CARE_PROVIDER_SITE_OTHER): Payer: Self-pay | Admitting: Physician Assistant

## 2020-03-15 VITALS — BP 108/69 | HR 87 | Temp 97.9°F | Ht 68.0 in | Wt 251.0 lb

## 2020-03-15 DIAGNOSIS — G4733 Obstructive sleep apnea (adult) (pediatric): Secondary | ICD-10-CM | POA: Diagnosis not present

## 2020-03-15 DIAGNOSIS — Z6838 Body mass index (BMI) 38.0-38.9, adult: Secondary | ICD-10-CM | POA: Diagnosis not present

## 2020-03-16 NOTE — Progress Notes (Signed)
Chief Complaint:   OBESITY Angie Velasquez is here to discuss her progress with her obesity treatment plan along with follow-up of her obesity related diagnoses. Angie Velasquez is on the Category 2 Plan and states she is following her eating plan approximately 50% of the time. Angie Velasquez states she is increasing her activity around the house.   Today's visit was #: 74 Starting weight: 249 lbs Starting date: 09/18/2017 Today's weight: 251 lbs Today's date: 03/15/2020 Total lbs lost to date: 0 Total lbs lost since last in-office visit: 0  Interim History: Angie Velasquez has been taking care of her friend who has terminal cancer and has been unable to cook. She is eating out more often and eating food being brought in by family and friends.  Subjective:   OSA (obstructive sleep apnea). Angie Velasquez is not using her CPAP as prescribed currently, but states that she has an appointment in the next few weeks to be fitted with a new mask.  Assessment/Plan:   OSA (obstructive sleep apnea). Intensive lifestyle modifications are the first line treatment for this issue. We discussed several lifestyle modifications today and she will continue to work on diet, exercise and weight loss efforts. We will continue to monitor. Orders and follow up as documented in patient record. Angie Velasquez will obtain a new mask and wear CPAP as prescribed.  Counseling  Sleep apnea is a condition in which breathing pauses or becomes shallow during sleep. This happens over and over during the night. This disrupts your sleep and keeps your body from getting the rest that it needs, which can cause tiredness and lack of energy (fatigue) during the day.  Sleep apnea treatment: If you were given a device to open your airway while you sleep, USE IT!  Sleep hygiene:   Limit or avoid alcohol, caffeinated beverages, and cigarettes, especially close to bedtime.   Do not eat a large meal or eat spicy foods right before bedtime. This can lead  to digestive discomfort that can make it hard for you to sleep.  Keep a sleep diary to help you and your health care provider figure out what could be causing your insomnia.  . Make your bedroom a dark, comfortable place where it is easy to fall asleep. ? Put up shades or blackout curtains to block light from outside. ? Use a white noise machine to block noise. ? Keep the temperature cool. . Limit screen use before bedtime. This includes: ? Watching TV. ? Using your smartphone, tablet, or computer. . Stick to a routine that includes going to bed and waking up at the same times every day and night. This can help you fall asleep faster. Consider making a quiet activity, such as reading, part of your nighttime routine. . Try to avoid taking naps during the day so that you sleep better at night. . Get out of bed if you are still awake after 15 minutes of trying to sleep. Keep the lights down, but try reading or doing a quiet activity. When you feel sleepy, go back to bed.  Class 2 severe obesity with serious comorbidity and body mass index (BMI) of 38.0 to 38.9 in adult, unspecified obesity type ([place]).  [name] is currently in the action stage of change. As such, her goal is to continue with weight loss efforts. She has agreed to the Category 2 Plan.   Exercise goals: Older adults should follow the adult guidelines. When older adults cannot meet the adult guidelines, they should be as  physically active as their abilities and conditions will allow.   Behavioral modification strategies: increasing lean protein intake and decreasing simple carbohydrates.  Angie Velasquez has agreed to follow-up with our clinic in 2 weeks. She was informed of the importance of frequent follow-up visits to maximize her success with intensive lifestyle modifications for her multiple health conditions.   Objective:   Blood pressure 108/69, pulse 87, temperature 97.9 F (36.6 C), temperature source Oral, height 5\' 8"  (1.727  m), weight 251 lb (113.9 kg), SpO2 97 %. Body mass index is 38.16 kg/m.  General: Cooperative, alert, well developed, in no acute distress. HEENT: Conjunctivae and lids unremarkable. Cardiovascular: Regular rhythm.  Lungs: Normal work of breathing. Neurologic: No focal deficits.   Lab Results  Component Value Date   CREATININE 1.06 (H) 12/31/2019   BUN 13 12/31/2019   NA 141 12/31/2019   K 4.1 12/31/2019   CL 104 12/31/2019   CO2 23 12/31/2019   Lab Results  Component Value Date   ALT 21 12/31/2019   AST 22 12/31/2019   ALKPHOS 112 12/31/2019   BILITOT 0.2 12/31/2019   Lab Results  Component Value Date   HGBA1C 5.9 (H) 12/31/2019   HGBA1C 5.8 (H) 08/28/2019   HGBA1C 5.7 (H) 01/09/2019   HGBA1C 5.8 (H) 07/25/2018   HGBA1C 5.7 (H) 09/18/2017   Lab Results  Component Value Date   INSULIN 23.0 12/31/2019   INSULIN 25.2 (H) 08/28/2019   INSULIN 18.6 01/09/2019   INSULIN 22.0 07/25/2018   INSULIN 25.3 (H) 09/18/2017   Lab Results  Component Value Date   TSH 3.430 09/18/2017   Lab Results  Component Value Date   CHOL 210 (H) 12/31/2019   HDL 66 12/31/2019   LDLCALC 113 (H) 12/31/2019   LDLDIRECT 77.0 01/18/2017   TRIG 177 (H) 12/31/2019   CHOLHDL 2 01/18/2017   Lab Results  Component Value Date   WBC 7.1 08/28/2019   HGB 13.4 08/28/2019   HCT 40.3 08/28/2019   MCV 90 08/28/2019   PLT 242 08/28/2019   No results found for: IRON, TIBC, FERRITIN  Obesity Behavioral Intervention Documentation for Insurance:   Approximately 15 minutes were spent on the discussion below.  ASK: We discussed the diagnosis of obesity with Angie Velasquez today and Angie Velasquez agreed to give Angie Velasquez permission to discuss obesity behavioral modification therapy today.  ASSESS: Angie Velasquez has the diagnosis of obesity and her BMI today is 38.2. Angie Velasquez is in the action stage of change.   ADVISE: Angie Velasquez was educated on the multiple health risks of obesity as well as the benefit of weight loss to  improve her health. She was advised of the need for long term treatment and the importance of lifestyle modifications to improve her current health and to decrease her risk of future health problems.  AGREE: Multiple dietary modification options and treatment options were discussed and Lessly agreed to follow the recommendations documented in the above note.  ARRANGE: Dachelle was educated on the importance of frequent visits to treat obesity as outlined per CMS and USPSTF guidelines and agreed to schedule her next follow up appointment today.  Attestation Statements:   Reviewed by clinician on day of visit: allergies, medications, problem list, medical history, surgical history, family history, social history, and previous encounter notes.  IMichaelene Song, am acting as transcriptionist for Abby Potash, PA-C   I have reviewed the above documentation for accuracy and completeness, and I agree with the above. Abby Potash, PA-C

## 2020-03-25 ENCOUNTER — Other Ambulatory Visit: Payer: Self-pay

## 2020-03-25 MED ORDER — LISINOPRIL 2.5 MG PO TABS: 2.5000 mg | ORAL_TABLET | Freq: Every day | ORAL | 0 refills | Status: DC

## 2020-03-30 ENCOUNTER — Ambulatory Visit (INDEPENDENT_AMBULATORY_CARE_PROVIDER_SITE_OTHER): Payer: Medicare HMO | Admitting: Physician Assistant

## 2020-03-30 ENCOUNTER — Other Ambulatory Visit: Payer: Self-pay

## 2020-03-30 ENCOUNTER — Encounter (INDEPENDENT_AMBULATORY_CARE_PROVIDER_SITE_OTHER): Payer: Self-pay | Admitting: Physician Assistant

## 2020-03-30 VITALS — BP 112/60 | HR 79 | Temp 98.0°F | Ht 68.0 in | Wt 252.0 lb

## 2020-03-30 DIAGNOSIS — G4733 Obstructive sleep apnea (adult) (pediatric): Secondary | ICD-10-CM

## 2020-03-30 DIAGNOSIS — Z6838 Body mass index (BMI) 38.0-38.9, adult: Secondary | ICD-10-CM | POA: Diagnosis not present

## 2020-03-31 NOTE — Progress Notes (Signed)
Chief Complaint:   OBESITY Angie Velasquez is here to discuss her progress with her obesity treatment plan along with follow-up of her obesity related diagnoses. Angie Velasquez is on the Category 2 Plan and states she is following her eating plan approximately 50% of the time. Angie Velasquez states she is exercising 0 minutes 0 times per week.  Today's visit was #: 31 Starting weight: 249 lbs Starting date: 09/18/2017 Today's weight: 252 lbs Today's date: 03/30/2020 Total lbs lost to date: 0 Total lbs lost since last in-office visit: 0  Interim History: Angie Velasquez feels like she is unable to focus on the plan currently. She is taking care of her friend who is in Hospice care.  Subjective:   OSA (obstructive sleep apnea). Angie Velasquez states she has seen Neurology since her last appointment and now has a new mask that she is wearing regularly.  Assessment/Plan:   OSA (obstructive sleep apnea). Intensive lifestyle modifications are the first line treatment for this issue. We discussed several lifestyle modifications today and she will continue to work on diet, exercise and weight loss efforts. We will continue to monitor. Orders and follow up as documented in patient record. Angie Velasquez will continue wearing her mask as prescribed.  Counseling  Sleep apnea is a condition in which breathing pauses or becomes shallow during sleep. This happens over and over during the night. This disrupts your sleep and keeps your body from getting the rest that it needs, which can cause tiredness and lack of energy (fatigue) during the day.  Sleep apnea treatment: If you were given a device to open your airway while you sleep, USE IT!  Sleep hygiene:   Limit or avoid alcohol, caffeinated beverages, and cigarettes, especially close to bedtime.   Do not eat a large meal or eat spicy foods right before bedtime. This can lead to digestive discomfort that can make it hard for you to sleep.  Keep a sleep diary to help you  and your health care provider figure out what could be causing your insomnia.  . Make your bedroom a dark, comfortable place where it is easy to fall asleep. ? Put up shades or blackout curtains to block light from outside. ? Use a white noise machine to block noise. ? Keep the temperature cool. . Limit screen use before bedtime. This includes: ? Watching TV. ? Using your smartphone, tablet, or computer. . Stick to a routine that includes going to bed and waking up at the same times every day and night. This can help you fall asleep faster. Consider making a quiet activity, such as reading, part of your nighttime routine. . Try to avoid taking naps during the day so that you sleep better at night. . Get out of bed if you are still awake after 15 minutes of trying to sleep. Keep the lights down, but try reading or doing a quiet activity. When you feel sleepy, go back to bed.  Class 2 severe obesity with serious comorbidity and body mass index (BMI) of 38.0 to 38.9 in adult, unspecified obesity type (Angie Velasquez).  Angie Velasquez is currently in the action stage of change. As such, her goal is to continue with weight loss efforts. She has agreed to practicing portion control and making smarter food choices, such as increasing vegetables and decreasing simple carbohydrates.   Exercise goals: Older adults should follow the adult guidelines. When older adults cannot meet the adult guidelines, they should be as physically active as their abilities and conditions will  allow.   Behavioral modification strategies: no skipping meals and meal planning and cooking strategies.  Angie Velasquez has agreed to follow-up with our clinic in 5 weeks. She was informed of the importance of frequent follow-up visits to maximize her success with intensive lifestyle modifications for her multiple health conditions.   Objective:   Blood pressure 112/60, pulse 79, temperature 98 F (36.7 C), temperature source Oral, height 5\' 8"  (1.727 m),  weight 252 lb (114.3 kg), SpO2 95 %. Body mass index is 38.32 kg/m.  General: Cooperative, alert, well developed, in no acute distress. HEENT: Conjunctivae and lids unremarkable. Cardiovascular: Regular rhythm.  Lungs: Normal work of breathing. Neurologic: No focal deficits.   Lab Results  Component Value Date   CREATININE 1.06 (H) 12/31/2019   BUN 13 12/31/2019   NA 141 12/31/2019   K 4.1 12/31/2019   CL 104 12/31/2019   CO2 23 12/31/2019   Lab Results  Component Value Date   ALT 21 12/31/2019   AST 22 12/31/2019   ALKPHOS 112 12/31/2019   BILITOT 0.2 12/31/2019   Lab Results  Component Value Date   HGBA1C 5.9 (H) 12/31/2019   HGBA1C 5.8 (H) 08/28/2019   HGBA1C 5.7 (H) 01/09/2019   HGBA1C 5.8 (H) 07/25/2018   HGBA1C 5.7 (H) 09/18/2017   Lab Results  Component Value Date   INSULIN 23.0 12/31/2019   INSULIN 25.2 (H) 08/28/2019   INSULIN 18.6 01/09/2019   INSULIN 22.0 07/25/2018   INSULIN 25.3 (H) 09/18/2017   Lab Results  Component Value Date   TSH 3.430 09/18/2017   Lab Results  Component Value Date   CHOL 210 (H) 12/31/2019   HDL 66 12/31/2019   LDLCALC 113 (H) 12/31/2019   LDLDIRECT 77.0 01/18/2017   TRIG 177 (H) 12/31/2019   CHOLHDL 2 01/18/2017   Lab Results  Component Value Date   WBC 7.1 08/28/2019   HGB 13.4 08/28/2019   HCT 40.3 08/28/2019   MCV 90 08/28/2019   PLT 242 08/28/2019   No results found for: IRON, TIBC, FERRITIN  Obesity Behavioral Intervention Documentation for Insurance:   Approximately 15 minutes were spent on the discussion below.  ASK: We discussed the diagnosis of obesity with Angie Velasquez today and Angie Velasquez agreed to give [name] permission to discuss obesity behavioral modification therapy today.  ASSESS: Angie Velasquez has the diagnosis of obesity and her BMI today is 38.3. Angie Velasquez is in the action stage of change.   ADVISE: Angie Velasquez was educated on the multiple health risks of obesity as well as the benefit of weight loss to  improve her health. She was advised of the need for long term treatment and the importance of lifestyle modifications to improve her current health and to decrease her risk of future health problems.  AGREE: Multiple dietary modification options and treatment options were discussed and Kaitylyn agreed to follow the recommendations documented in the above note.  ARRANGE: Periann was educated on the importance of frequent visits to treat obesity as outlined per CMS and USPSTF guidelines and agreed to schedule her next follow up appointment today.  Attestation Statements:   Reviewed by clinician on day of visit: allergies, medications, problem list, medical history, surgical history, family history, social history, and previous encounter notes.  IMichaelene Song, am acting as transcriptionist for Abby Potash, PA-C   I have reviewed the above documentation for accuracy and completeness, and I agree with the above. Abby Potash, PA-C

## 2020-04-03 DIAGNOSIS — G4733 Obstructive sleep apnea (adult) (pediatric): Secondary | ICD-10-CM | POA: Diagnosis not present

## 2020-04-08 ENCOUNTER — Other Ambulatory Visit: Payer: Self-pay

## 2020-04-08 ENCOUNTER — Encounter: Payer: Self-pay | Admitting: Adult Health

## 2020-04-08 ENCOUNTER — Ambulatory Visit: Payer: Medicare HMO | Admitting: Adult Health

## 2020-04-08 VITALS — BP 129/74 | HR 81 | Temp 98.5°F | Ht 67.0 in | Wt 246.0 lb

## 2020-04-08 DIAGNOSIS — Z9989 Dependence on other enabling machines and devices: Secondary | ICD-10-CM

## 2020-04-08 DIAGNOSIS — G4733 Obstructive sleep apnea (adult) (pediatric): Secondary | ICD-10-CM

## 2020-04-08 NOTE — Patient Instructions (Signed)
Continue using CPAP nightly and greater than 4 hours each night °If your symptoms worsen or you develop new symptoms please let us know.  ° °

## 2020-04-08 NOTE — Progress Notes (Signed)
PATIENT: Angie Velasquez DOB: October 09, 1946  REASON FOR VISIT: follow up HISTORY FROM: patient  HISTORY OF PRESENT ILLNESS: Today 04/08/20 :  Ms. Angie Velasquez is a 74 year old female with a history of severe sleep apnea.  She returns today for follow-up.  Her CPAP download indicates that she used her mach14 out of the last 30 days for compliance of 47%.  She used her machine greater than 4 hours 6 out of 30 days for compliance of 20%.  On average she uses her machine 4 hours and 2 minutes.  Her residual AHI is 5.3 on 6 to 16 cm of water with EPR of 3.  Leak in the 95th percentile is 26.6 L/min she reports that she got new headgear and it works better for her.  She states that she cannot tolerate the full facemask.  Since then she has been using it more consistently.  HISTORY (Copied from Dr.Dohmeier's note) [name] a 73 y.o. year old White or Caucasian female patientand seen upon her requeston 12/17/2019. [name] is well known to me as the wife ( former) of Al, who used to be treated here.  She has plans to live between [place] and [place].   Chiefconcernaccording to patient :  [name] reports that she usually can sleep through the night ever times when she suffered from anxiety and depression.  Says this has been a time of considerable recovery for her since she made the decision to move to [place] at least for part of the year.  She does wake up sometimes from her own snoring or from gasping for air and she recently learned that her son since using CPAP for the treatment of obstructive sleep apnea, has also improved his sleep quality.  She has been told by many family member members that she is a snorer and she would like to be evaluated for the possibility of obstructive sleep apnea.  I have the pleasure of seeing [name] today,a right-handed White or Caucasian female with a possible sleep disorder. She  has a past medical  history of Anxiety, Arthritis, Cataracts, bilateral, Complication of anesthesia, Fracture of pelvis ([place]), GERD (gastroesophageal reflux disease), chest pain, Hyperlipidemia, INSOMNIA (10/14/2008), Joint pain, Kidney problem, Low blood sugar, Obesity, Palpitations, Proteinuria, and Tobacco abuse. GERD has affecting her sleep.    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  Family status is separated, with adult children, several grandchildren.  Pets are not present. Tobacco use; quit 2014.  ETOH use seldomly.  Caffeine intake in form of Coffee( 2 cups I AM ) Soda( coke 1 can a week) Tea ( sweet , when eating out. ) or energy drinks. Regular exercise in form of walking.       Sleep habits are as follows:The patient's dinner time is between 6 PM. The patient goes to bed at 9.30PM and continues to watch TV - sleep for 3-4 hours, wakes for 1 bathroom breaks, the first time at 3 AM.   The preferred sleep position is left side , with the support of 1 pillow.  Dreams are reportedlyfrequent/vivid.  8.30 AM is the usual rise time.  The patient wakes up spontaneously .[name]  He/ She reports not feeling refreshed or restored in AM, with symptoms such as dry mouth, Naps are taken infrequently, lasting 30 minutes and are more refreshing than nocturnal sleep.    REVIEW OF  SYSTEMS: Out of a complete 14 system review of symptoms, the patient complains only of the following symptoms, and all other reviewed systems are negative.  FSS 15 ESS 9  ALLERGIES: Allergies  Allergen Reactions  . Epinephrine Palpitations  . Codeine Nausea Only  . Penicillins Nausea Only  . Prednisone Cough    HOME MEDICATIONS: Outpatient Medications Prior to Visit  Medication Sig Dispense Refill  . acetaminophen (TYLENOL) 500 MG tablet Take 500 mg by mouth every 6 (six) hours as needed.    [name]  allopurinol (ZYLOPRIM) 300 MG tablet TAKE 1 TABLET (300 MG TOTAL) BY MOUTH 2 (TWO) TIMES DAILY. 180 tablet 3  . AMBULATORY NON FORMULARY MEDICATION Rolling walker with a seat 1 Units 0  . aspirin 81 MG tablet Take 81 mg by mouth daily.    [name] CALCIUM-MAGNESIUM-ZINC PO Take by mouth.    . clonazePAM (KLONOPIN) 0.5 MG tablet TAKE 1 TABLET(0.5 MG) BY MOUTH AT BEDTIME AS NEEDED 90 tablet 1  . diazepam (VALIUM) 10 MG tablet Take 1 tablet (10 mg total) by mouth every 6 (six) hours as needed (as needed for muscle spasms). 30 tablet 0  . diclofenac sodium (VOLTAREN) 1 % GEL Apply topically to affected area qid 100 g 2  . gabapentin (NEURONTIN) 300 MG capsule Take 1 capsule, orally, up to TID or as directed 270 capsule 1  . lisinopril (ZESTRIL) 2.5 MG tablet Take 1 tablet (2.5 mg total) by mouth daily. 90 tablet 0  . meloxicam (MOBIC) 15 MG tablet TAKE 1 TABLET BY MOUTH DAILY FOR 14 DAYS, THEN AS NEEDED 90 tablet 3  . nystatin cream (MYCOSTATIN) APPLY 1 APPLICATION TOPICALLY TO RASH IN GROIN TWICE DAILY FOR UP TO 2 WEEKS MAXIMUM 90 g 2  . OVER THE COUNTER MEDICATION Cherry extract bid    . OVER THE COUNTER MEDICATION B-12 one daily    . sertraline (ZOLOFT) 50 MG tablet Take 1 tablet (50 mg total) by mouth daily. 90 tablet 3  . simvastatin (ZOCOR) 20 MG tablet TAKE 1 TABLET AT BEDTIME 90 tablet 0  . Sodium Bicarbonate (NICE PURE BAKING SODA) POWD by Does not apply route.    . traMADol (ULTRAM) 50 MG tablet Take 1 tablet (50 mg total) by mouth every 6 (six) hours as needed for moderate pain or severe pain. 60 tablet 0  . triamcinolone cream (KENALOG) 0.1 % Apply 1 application topically 2 (two) times daily. For 7-10 days for recurring eczema issues 80 g 1  . Vitamin D, Ergocalciferol, (DRISDOL) 1.25 MG (50000 UNIT) CAPS capsule Take 1 capsule (50,000 Units total) by mouth every 7 (seven) days. 12 capsule 0   No facility-administered medications prior to visit.    PAST MEDICAL HISTORY: Past Medical History:   Diagnosis Date  . Anxiety    years ago - no longer an issue  . Arthritis    gout  . Cataracts, bilateral   . Complication of anesthesia   . Fracture of pelvis ([place])   . GERD (gastroesophageal reflux disease)   . Hx of chest pain    "anxiety"  . Hyperlipidemia   . INSOMNIA 10/14/2008   in past  . Joint pain   . Kidney problem   . Low blood sugar    eats several smaller meals a day  . Obesity   . Palpitations    Stress related   . Proteinuria    H/O  . Tobacco abuse    quit smoking 08/2013    PAST  SURGICAL HISTORY: Past Surgical History:  Procedure Laterality Date  . ABDOMINAL HYSTERECTOMY    . APPENDECTOMY     with hysterectomy  . childbirth     x4  . CHOLECYSTECTOMY  2001  . EYE SURGERY Bilateral 2015   cataract surgery with lens implant  . NEPHRECTOMY  1992   complex cystic mass - nephrectomy right  . ORIF ANKLE FRACTURE Right 07/02/2014   Procedure: OPEN REDUCTION INTERNAL FIXATION (ORIF) RIGHT ANKLE BIMALLOELAR FRACTURE;  Surgeon: Wylene Simmer, MD;  Location: [facility];  Service: Orthopedics;  Laterality: Right;  . OTHER SURGICAL HISTORY     hysterectomy, R ovary remains  . SHOULDER SURGERY     bone spurs left    FAMILY HISTORY: Family History  Problem Relation Age of Onset  . Stroke Father   . Alzheimer's disease Father        early, states also parkinsons. died 13  . Hypertension Father   . Obesity Father   . Ovarian cancer Mother        Dr. Jana Hakim   . Arthritis Mother   . Obesity Mother   . Colon cancer Paternal Grandmother   . Liver disease Daughter        On hospice age 55    SOCIAL HISTORY: Social History   Socioeconomic History  . Marital status: Married    Spouse name: Al Bunge  . Number of children: 4  . Years of education: Not on file  . Highest education level: Not on file  Occupational History  . Occupation: retired  Tobacco Use  . Smoking status: Former Smoker    Packs/day: 1.00    Years: 35.00    Pack years: 35.00     Types: Cigarettes    Quit date: 09/01/2013    Years since quitting: 6.6  . Smokeless tobacco: Never Used  . Tobacco comment: Former smoker with no desire to smoke again. Smoking years edited at patients request to include periods when she did not smoke.  Substance and Sexual Activity  . Alcohol use: Yes    Alcohol/week: 1.0 standard drinks    Types: 1 Shots of liquor per week  . Drug use: No  . Sexual activity: Not on file  Other Topics Concern  . Not on file  Social History Narrative   Divorced. Found love of her life recently in 2016 (Al). 3 sons. 5 grandkids.    Daughter (had at age 45 and was adopted). To care for [name] her granddaughter when daughter passes- daughter on hospice.       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 Difficulty of Paying Living Expenses:   Food Insecurity:   . Worried About Charity fundraiser in the Last Year:   . Arboriculturist in the Last Year:   Transportation Needs:   . Film/video editor (Medical):   [name] Lack of Transportation (Non-Medical):   Physical Activity:   . Days of Exercise per Week:   . Minutes of Exercise per Session:   Stress:   . Feeling of Stress :   Social Connections:   . Frequency of Communication with Friends and Family:   . Frequency of Social Gatherings with Friends and Family:   .  Attends Religious Services:   . Active Member of Clubs or Organizations:   . Attends Archivist Meetings:   [name] Marital Status:   Intimate Partner Violence:   . Fear of Current or Ex-Partner:   . Emotionally Abused:   [name] Physically Abused:   . Sexually Abused:       PHYSICAL EXAM  Vitals:   04/08/20 1304  BP: 129/74  Pulse: 81  Temp: 98.5 F (36.9 C)  Weight: 246  lb (111.6 kg)  Height: 5\' 7"  (1.702 m)   Body mass index is 38.53 kg/m.  Generalized: Well developed, in no acute distress  Chest: Lungs clear to auscultation bilaterally  Neurological examination  Mentation: Alert oriented to time, place, history taking. Follows all commands speech and language fluent Cranial nerve II-XII: Extraocular movements were full, visual field were full on confrontational test Head turning and shoulder shrug  were normal and symmetric. Motor: The motor testing reveals 5 over 5 strength of all 4 extremities. Good symmetric motor tone is noted throughout.  Sensory: Sensory testing is intact to soft touch on all 4 extremities. No evidence of extinction is noted.  Gait and station: Gait is normal.    DIAGNOSTIC DATA (LABS, IMAGING, TESTING) - I reviewed patient records, labs, notes, testing and imaging myself where available.  Lab Results  Component Value Date   WBC 7.1 08/28/2019   HGB 13.4 08/28/2019   HCT 40.3 08/28/2019   MCV 90 08/28/2019   PLT 242 08/28/2019      Component Value Date/Time   NA 141 12/31/2019 0928   K 4.1 12/31/2019 0928   CL 104 12/31/2019 0928   CO2 23 12/31/2019 0928   GLUCOSE 99 12/31/2019 0928   GLUCOSE 109 (H) 01/18/2017 0902   BUN 13 12/31/2019 0928   CREATININE 1.06 (H) 12/31/2019 0928   CALCIUM 9.2 12/31/2019 0928   PROT 6.3 12/31/2019 0928   ALBUMIN 4.3 12/31/2019 0928   AST 22 12/31/2019 0928   ALT 21 12/31/2019 0928   ALKPHOS 112 12/31/2019 0928   BILITOT 0.2 12/31/2019 0928   GFRNONAA 53 (L) 12/31/2019 0928   GFRAA 61 12/31/2019 0928   Lab Results  Component Value Date   CHOL 210 (H) 12/31/2019   HDL 66 12/31/2019   LDLCALC 113 (H) 12/31/2019   LDLDIRECT 77.0 01/18/2017   TRIG 177 (H) 12/31/2019   CHOLHDL 2 01/18/2017   Lab Results  Component Value Date   HGBA1C 5.9 (H) 12/31/2019   No results found for: VITAMINB12 Lab Results  Component Value Date   TSH 3.430 09/18/2017      ASSESSMENT AND  PLAN 74 y.o. year old female  has a past medical history of Anxiety, Arthritis, Cataracts, bilateral, Complication of anesthesia, Fracture of pelvis ([place]), GERD (gastroesophageal reflux disease), chest pain, Hyperlipidemia, INSOMNIA (10/14/2008), Joint pain, Kidney problem, Low blood sugar, Obesity, Palpitations, Proteinuria, and Tobacco abuse. here with:  1. OSA on CPAP  - CPAP compliance is suboptimal but improving - Good treatment of AHI  - Encourage patient to use CPAP nightly and > 4 hours each night - F/U in 6 months or sooner if needed   I spent 20 minutes of face-to-face and non-face-to-face time with patient.  This included previsit chart review, lab review, study review, order entry, electronic health record documentation, patient education.  Ward Givens, MSN, NP-C 04/08/2020, 1:10 PM [facility] [address] [place], [place] [address] [phone]

## 2020-04-12 ENCOUNTER — Telehealth: Payer: Self-pay | Admitting: Family Medicine

## 2020-04-12 NOTE — Telephone Encounter (Signed)
I left a message asking the patient to call and schedule Medicare AWV with [name] ([facility] Health Coach).  If patient calls back, please schedule Medicare Wellness Visit (initial) at next available opening.  VDM (Dee-Dee) 

## 2020-05-04 ENCOUNTER — Other Ambulatory Visit: Payer: Self-pay

## 2020-05-04 ENCOUNTER — Ambulatory Visit (INDEPENDENT_AMBULATORY_CARE_PROVIDER_SITE_OTHER): Payer: Medicare HMO | Admitting: Physician Assistant

## 2020-05-04 ENCOUNTER — Encounter (INDEPENDENT_AMBULATORY_CARE_PROVIDER_SITE_OTHER): Payer: Self-pay | Admitting: Physician Assistant

## 2020-05-04 VITALS — BP 99/66 | HR 80 | Temp 98.5°F | Ht 68.0 in | Wt 256.0 lb

## 2020-05-04 DIAGNOSIS — M79674 Pain in right toe(s): Secondary | ICD-10-CM | POA: Diagnosis not present

## 2020-05-04 DIAGNOSIS — G4733 Obstructive sleep apnea (adult) (pediatric): Secondary | ICD-10-CM | POA: Diagnosis not present

## 2020-05-04 DIAGNOSIS — R7303 Prediabetes: Secondary | ICD-10-CM

## 2020-05-04 DIAGNOSIS — Z6838 Body mass index (BMI) 38.0-38.9, adult: Secondary | ICD-10-CM

## 2020-05-04 DIAGNOSIS — E7849 Other hyperlipidemia: Secondary | ICD-10-CM | POA: Diagnosis not present

## 2020-05-04 DIAGNOSIS — E559 Vitamin D deficiency, unspecified: Secondary | ICD-10-CM | POA: Diagnosis not present

## 2020-05-04 NOTE — Progress Notes (Signed)
Chief Complaint:   OBESITY Angie Velasquez is here to discuss her progress with her obesity treatment plan along with follow-up of her obesity related diagnoses. Angie Velasquez is on the Category 2 Plan and states she is following her eating plan approximately 50% of the time. Angie Velasquez states she is unable to exercise.  Today's visit was #: 100 Starting weight: 249 lbs Starting date: 09/18/2017 Today's weight: 256 lbs Today's date: 05/04/2020 Total lbs lost to date: 0 Total lbs lost since last in-office visit: 0  Interim History: Angie Velasquez continues to take are of her friend who is in Hospice care.  She is eating the food brought in by friends, which is mostly "comfort food".  She is not getting enough protein.  Subjective:   1. Other hyperlipidemia Angie Velasquez has hyperlipidemia and has been trying to improve her cholesterol levels with intensive lifestyle modification including a low saturated fat diet, exercise and weight loss. She denies any chest pain, claudication or myalgias.  She is taking Zocor.    Lab Results  Component Value Date   ALT 21 12/31/2019   AST 22 12/31/2019   ALKPHOS 112 12/31/2019   BILITOT 0.2 12/31/2019   Lab Results  Component Value Date   CHOL 210 (H) 12/31/2019   HDL 66 12/31/2019   LDLCALC 113 (H) 12/31/2019   LDLDIRECT 77.0 01/18/2017   TRIG 177 (H) 12/31/2019   CHOLHDL 2 01/18/2017   2. Vitamin D deficiency Angie Velasquez's Vitamin D level was 53.1 on 12/31/2019. She is currently taking prescription vitamin D 50,000 IU each week. She denies nausea, vomiting or muscle weakness.  3. Prediabetes Angie Velasquez has a diagnosis of prediabetes based on her elevated HgA1c and was informed this puts her at greater risk of developing diabetes. She continues to work on diet and exercise to decrease her risk of diabetes. She denies nausea or hypoglycemia.  She is taking no medications and denies polyphagia.  Lab Results  Component Value Date   HGBA1C 5.9 (H) 12/31/2019   Lab  Results  Component Value Date   INSULIN 23.0 12/31/2019   INSULIN 25.2 (H) 08/28/2019   INSULIN 18.6 01/09/2019   INSULIN 22.0 07/25/2018   INSULIN 25.3 (H) 09/18/2017   4. Pain of toe of right foot Angie Velasquez has a history of gout.  She is taking allopurinol.  Assessment/Plan:   1. Other hyperlipidemia Cardiovascular risk and specific lipid/LDL goals reviewed.  We discussed several lifestyle modifications today and [name] will continue to work on diet, exercise and weight loss efforts. Orders and follow up as documented in patient record.   Counseling Intensive lifestyle modifications are the first line treatment for this issue.  Dietary changes: Increase soluble fiber. Decrease simple carbohydrates.  Exercise changes: Moderate to vigorous-intensity aerobic activity 150 minutes per week if tolerated.  Lipid-lowering medications: see documented in medical record. - Comprehensive metabolic panel - Lipid Panel With LDL/HDL Ratio  2. Vitamin D deficiency Low Vitamin D level contributes to fatigue and are associated with obesity, breast, and colon cancer. She agrees to continue to take prescription Vitamin D @50 ,000 IU every week and will follow-up for routine testing of Vitamin D, at least 2-3 times per year to avoid over-replacement. - VITAMIN D 25 Hydroxy (Vit-D Deficiency, Fractures)  3. Prediabetes Angie Velasquez will continue to work on weight loss, exercise, and decreasing simple carbohydrates to help decrease the risk of diabetes.  - Hemoglobin A1c - Insulin, random  4. Pain of toe of right foot Will check uric acid level today. -  Uric acid  5. Class 2 severe obesity with serious comorbidity and body mass index (BMI) of 38.0 to 38.9 in adult, unspecified obesity type Angie Velasquez) Angie Velasquez is currently in the action stage of change. As such, her goal is to continue with weight loss efforts. She has agreed to the Category 2 Plan.   Exercise goals: Older adults should follow the adult  guidelines. When older adults cannot meet the adult guidelines, they should be as physically active as their abilities and conditions will allow.   Behavioral modification strategies: increasing lean protein intake and meal planning and cooking strategies.  Angie Velasquez has agreed to follow-up with our clinic in 3-4 weeks. She was informed of the importance of frequent follow-up visits to maximize her success with intensive lifestyle modifications for her multiple health conditions.   Angie Velasquez was informed we would discuss her lab results at her next visit unless there is a critical issue that needs to be addressed sooner. Angie Velasquez agreed to keep her next visit at the agreed upon time to discuss these results.  Objective:   Blood pressure 99/66, pulse 80, temperature 98.5 F (36.9 C), height 5\' 8"  (1.727 m), weight 256 lb (116.1 kg), SpO2 98 %. Body mass index is 38.92 kg/m.  General: Cooperative, alert, well developed, in no acute distress. HEENT: Conjunctivae and lids unremarkable. Cardiovascular: Regular rhythm.  Lungs: Normal work of breathing. Neurologic: No focal deficits.   Lab Results  Component Value Date   CREATININE 1.06 (H) 12/31/2019   BUN 13 12/31/2019   NA 141 12/31/2019   K 4.1 12/31/2019   CL 104 12/31/2019   CO2 23 12/31/2019   Lab Results  Component Value Date   ALT 21 12/31/2019   AST 22 12/31/2019   ALKPHOS 112 12/31/2019   BILITOT 0.2 12/31/2019   Lab Results  Component Value Date   HGBA1C 5.9 (H) 12/31/2019   HGBA1C 5.8 (H) 08/28/2019   HGBA1C 5.7 (H) 01/09/2019   HGBA1C 5.8 (H) 07/25/2018   HGBA1C 5.7 (H) 09/18/2017   Lab Results  Component Value Date   INSULIN 23.0 12/31/2019   INSULIN 25.2 (H) 08/28/2019   INSULIN 18.6 01/09/2019   INSULIN 22.0 07/25/2018   INSULIN 25.3 (H) 09/18/2017   Lab Results  Component Value Date   TSH 3.430 09/18/2017   Lab Results  Component Value Date   CHOL 210 (H) 12/31/2019   HDL 66 12/31/2019   LDLCALC 113  (H) 12/31/2019   LDLDIRECT 77.0 01/18/2017   TRIG 177 (H) 12/31/2019   CHOLHDL 2 01/18/2017   Lab Results  Component Value Date   WBC 7.1 08/28/2019   HGB 13.4 08/28/2019   HCT 40.3 08/28/2019   MCV 90 08/28/2019   PLT 242 08/28/2019   Obesity Behavioral Intervention Documentation for Insurance:   Approximately 15 minutes were spent on the discussion below.  ASK: We discussed the diagnosis of obesity with Angie Velasquez today and Angie Velasquez agreed to give [name] permission to discuss obesity behavioral modification therapy today.  ASSESS: Angie Velasquez has the diagnosis of obesity and her BMI today is 38.9. Angie Velasquez is in the action stage of change.   ADVISE: Angie Velasquez was educated on the multiple health risks of obesity as well as the benefit of weight loss to improve her health. She was advised of the need for long term treatment and the importance of lifestyle modifications to improve her current health and to decrease her risk of future health problems.  AGREE: Multiple dietary modification options and treatment options were  discussed and Angie Velasquez agreed to follow the recommendations documented in the above note.  ARRANGE: Angie Velasquez was educated on the importance of frequent visits to treat obesity as outlined per CMS and USPSTF guidelines and agreed to schedule her next follow up appointment today.  Attestation Statements:   Reviewed by clinician on day of visit: allergies, medications, problem list, medical history, surgical history, family history, social history, and previous encounter notes.  I, Water quality scientist, CMA, am acting as Location manager for [employer], PA-C.  I have reviewed the above documentation for accuracy and completeness, and I agree with the above. Angie Potash, PA-C

## 2020-05-05 ENCOUNTER — Encounter: Payer: Self-pay | Admitting: Family Medicine

## 2020-05-05 LAB — COMPREHENSIVE METABOLIC PANEL
ALT: 22 IU/L (ref 0–32)
AST: 20 IU/L (ref 0–40)
Albumin/Globulin Ratio: 2.4 — ABNORMAL HIGH (ref 1.2–2.2)
Albumin: 4.6 g/dL (ref 3.7–4.7)
Alkaline Phosphatase: 104 IU/L (ref 48–121)
BUN/Creatinine Ratio: 17 (ref 12–28)
BUN: 18 mg/dL (ref 8–27)
Bilirubin Total: 0.2 mg/dL (ref 0.0–1.2)
CO2: 24 mmol/L (ref 20–29)
Calcium: 9.3 mg/dL (ref 8.7–10.3)
Chloride: 101 mmol/L (ref 96–106)
Creatinine, Ser: 1.03 mg/dL — ABNORMAL HIGH (ref 0.57–1.00)
GFR calc Af Amer: 63 mL/min/{1.73_m2} (ref >59)
GFR calc non Af Amer: 54 mL/min/{1.73_m2} — ABNORMAL LOW (ref >59)
Globulin, Total: 1.9 g/dL (ref 1.5–4.5)
Glucose: 100 mg/dL — ABNORMAL HIGH (ref 65–99)
Potassium: 4.2 mmol/L (ref 3.5–5.2)
Sodium: 140 mmol/L (ref 134–144)
Total Protein: 6.5 g/dL (ref 6.0–8.5)

## 2020-05-05 LAB — HEMOGLOBIN A1C
Est. average glucose Bld gHb Est-mCnc: 123 mg/dL
Hgb A1c MFr Bld: 5.9 % — ABNORMAL HIGH (ref 4.8–5.6)

## 2020-05-05 LAB — LIPID PANEL WITH LDL/HDL RATIO
Cholesterol, Total: 212 mg/dL — ABNORMAL HIGH (ref 100–199)
HDL: 63 mg/dL (ref >39)
LDL Chol Calc (NIH): 112 mg/dL — ABNORMAL HIGH (ref 0–99)
LDL/HDL Ratio: 1.8 ratio (ref 0.0–3.2)
Triglycerides: 215 mg/dL — ABNORMAL HIGH (ref 0–149)
VLDL Cholesterol Cal: 37 mg/dL (ref 5–40)

## 2020-05-05 LAB — VITAMIN D 25 HYDROXY (VIT D DEFICIENCY, FRACTURES): Vit D, 25-Hydroxy: 40.4 ng/mL (ref 30.0–100.0)

## 2020-05-05 LAB — URIC ACID: Uric Acid: 3.7 mg/dL (ref 3.1–7.9)

## 2020-05-05 LAB — INSULIN, RANDOM: INSULIN: 26.9 u[IU]/mL — ABNORMAL HIGH (ref 2.6–24.9)

## 2020-05-17 ENCOUNTER — Encounter: Payer: Self-pay | Admitting: Family Medicine

## 2020-05-19 ENCOUNTER — Other Ambulatory Visit: Payer: Self-pay

## 2020-05-19 DIAGNOSIS — M549 Dorsalgia, unspecified: Secondary | ICD-10-CM

## 2020-05-25 ENCOUNTER — Ambulatory Visit (INDEPENDENT_AMBULATORY_CARE_PROVIDER_SITE_OTHER): Payer: Medicare HMO | Admitting: Physician Assistant

## 2020-05-25 ENCOUNTER — Other Ambulatory Visit: Payer: Self-pay

## 2020-05-25 ENCOUNTER — Encounter (INDEPENDENT_AMBULATORY_CARE_PROVIDER_SITE_OTHER): Payer: Self-pay | Admitting: Physician Assistant

## 2020-05-25 VITALS — BP 115/71 | HR 84 | Temp 97.6°F | Ht 68.0 in | Wt 255.0 lb

## 2020-05-25 DIAGNOSIS — E559 Vitamin D deficiency, unspecified: Secondary | ICD-10-CM | POA: Diagnosis not present

## 2020-05-25 DIAGNOSIS — Z6838 Body mass index (BMI) 38.0-38.9, adult: Secondary | ICD-10-CM

## 2020-05-25 DIAGNOSIS — R7303 Prediabetes: Secondary | ICD-10-CM

## 2020-05-25 MED ORDER — METFORMIN HCL 500 MG PO TABS: 500.0000 mg | ORAL_TABLET | Freq: Every day | ORAL | 0 refills | Status: DC

## 2020-05-25 MED ORDER — VITAMIN D (ERGOCALCIFEROL) 1.25 MG (50000 UNIT) PO CAPS: 50000.0000 [IU] | ORAL_CAPSULE | ORAL | 0 refills | Status: DC

## 2020-05-26 NOTE — Progress Notes (Signed)
Chief Complaint:   OBESITY Angie Velasquez is here to discuss her progress with her obesity treatment plan along with follow-up of her obesity related diagnoses. Angie Velasquez is on the Category 2 Plan and states she is following her eating plan approximately 60-70% of the time. Angie Velasquez states she is walking for 20 minutes 3 times per week.  Today's visit was #: 99 Starting weight: 249 lbs Starting date: 09/18/2018 Today's weight: 255 lbs Today's date: 05/25/2020 Total lbs lost to date: 0 Total lbs lost since last in-office visit: 1 lb  Interim History: Angie Velasquez has been taking care of her friend and eating food that is brought into the house.  Subjective:   1. Vitamin D deficiency Angie Velasquez's Vitamin D level was 40.4 on 05/04/2020. She is currently taking prescription vitamin D 50,000 IU each week. She denies nausea, vomiting or muscle weakness.  2. Prediabetes Angie Velasquez has a diagnosis of prediabetes based on her elevated HgA1c and was informed this puts her at greater risk of developing diabetes. She continues to work on diet and exercise to decrease her risk of diabetes. She denies nausea or hypoglycemia.  She is on no medications.  Lab Results  Component Value Date   HGBA1C 5.9 (H) 05/04/2020   Lab Results  Component Value Date   INSULIN 26.9 (H) 05/04/2020   INSULIN 23.0 12/31/2019   INSULIN 25.2 (H) 08/28/2019   INSULIN 18.6 01/09/2019   INSULIN 22.0 07/25/2018   Assessment/Plan:   1. Vitamin D deficiency Low Vitamin D level contributes to fatigue and are associated with obesity, breast, and colon cancer. She agrees to continue to take prescription Vitamin D @50 ,000 IU every week and will follow-up for routine testing of Vitamin D, at least 2-3 times per year to avoid over-replacement. - Vitamin D, Ergocalciferol, (DRISDOL) 1.25 MG (50000 UNIT) CAPS capsule; Take 1 capsule (50,000 Units total) by mouth 2 (two) times a week.  Dispense: 10 capsule; Refill: 0  2. Prediabetes Angie Velasquez  will continue to work on weight loss, exercise, and decreasing simple carbohydrates to help decrease the risk of diabetes.  Angie Velasquez will start metformin 500 mg daily with breakfast. - metFORMIN (GLUCOPHAGE) 500 MG tablet; Take 1 tablet (500 mg total) by mouth daily with breakfast.  Dispense: 30 tablet; Refill: 0  3. Class 2 severe obesity with serious comorbidity and body mass index (BMI) of 38.0 to 38.9 in adult, unspecified obesity type [facility]) Angie Velasquez is currently in the action stage of change. As such, her goal is to continue with weight loss efforts. She has agreed to the Category 2 Plan.   Exercise goals: As is.  Behavioral modification strategies: increasing lean protein intake and meal planning and cooking strategies.  Angie Velasquez has agreed to follow-up with our clinic in 3-4 weeks. She was informed of the importance of frequent follow-up visits to maximize her success with intensive lifestyle modifications for her multiple health conditions.   Objective:   Blood pressure 115/71, pulse 84, temperature 97.6 F (36.4 C), height 5\' 8"  (1.727 m), weight 255 lb (115.7 kg), SpO2 97 %. Body mass index is 38.77 kg/m.  General: Cooperative, alert, well developed, in no acute distress. HEENT: Conjunctivae and lids unremarkable. Cardiovascular: Regular rhythm.  Lungs: Normal work of breathing. Neurologic: No focal deficits.   Lab Results  Component Value Date   CREATININE 1.03 (H) 05/04/2020   BUN 18 05/04/2020   NA 140 05/04/2020   K 4.2 05/04/2020   CL 101 05/04/2020   CO2 24 05/04/2020  Lab Results  Component Value Date   ALT 22 05/04/2020   AST 20 05/04/2020   ALKPHOS 104 05/04/2020   BILITOT 0.2 05/04/2020   Lab Results  Component Value Date   HGBA1C 5.9 (H) 05/04/2020   HGBA1C 5.9 (H) 12/31/2019   HGBA1C 5.8 (H) 08/28/2019   HGBA1C 5.7 (H) 01/09/2019   HGBA1C 5.8 (H) 07/25/2018   Lab Results  Component Value Date   INSULIN 26.9 (H) 05/04/2020   INSULIN 23.0  12/31/2019   INSULIN 25.2 (H) 08/28/2019   INSULIN 18.6 01/09/2019   INSULIN 22.0 07/25/2018   Lab Results  Component Value Date   TSH 3.430 09/18/2017   Lab Results  Component Value Date   CHOL 212 (H) 05/04/2020   HDL 63 05/04/2020   LDLCALC 112 (H) 05/04/2020   LDLDIRECT 77.0 01/18/2017   TRIG 215 (H) 05/04/2020   CHOLHDL 2 01/18/2017   Lab Results  Component Value Date   WBC 7.1 08/28/2019   HGB 13.4 08/28/2019   HCT 40.3 08/28/2019   MCV 90 08/28/2019   PLT 242 08/28/2019   Obesity Behavioral Intervention Documentation for Insurance:   Approximately 15 minutes were spent on the discussion below.  ASK: We discussed the diagnosis of obesity with [name] today and Alyssabeth agreed to give [name] permission to discuss obesity behavioral modification therapy today.  ASSESS: Angie Velasquez has the diagnosis of obesity and her BMI today is 38.9. Angie Velasquez is in the action stage of change.   ADVISE: Angie Velasquez was educated on the multiple health risks of obesity as well as the benefit of weight loss to improve her health. She was advised of the need for long term treatment and the importance of lifestyle modifications to improve her current health and to decrease her risk of future health problems.  AGREE: Multiple dietary modification options and treatment options were discussed and Angie Velasquez agreed to follow the recommendations documented in the above note.  ARRANGE: Angie Velasquez was educated on the importance of frequent visits to treat obesity as outlined per CMS and USPSTF guidelines and agreed to schedule her next follow up appointment today.  Attestation Statements:   Reviewed by clinician on day of visit: allergies, medications, problem list, medical history, surgical history, family history, social history, and previous encounter notes.  I, Water quality scientist, CMA, am acting as transcriptionist for Angie Potash, PA-C  I have reviewed the above documentation for accuracy and completeness,  and I agree with the above. Angie Potash, PA-C

## 2020-06-03 ENCOUNTER — Other Ambulatory Visit: Payer: Self-pay | Admitting: Family Medicine

## 2020-06-03 DIAGNOSIS — G4733 Obstructive sleep apnea (adult) (pediatric): Secondary | ICD-10-CM | POA: Diagnosis not present

## 2020-06-04 NOTE — Progress Notes (Deleted)
Phone [phone]   Subjective:  Patient presents today for their annual physical. Chief complaint-noted.   See problem oriented charting- ROS- full  review of systems was completed and negative except for: ***  The following were reviewed and entered/updated in epic: Past Medical History:  Diagnosis Date  . Anxiety    years ago - no longer an issue  . Arthritis    gout  . Cataracts, bilateral   . Complication of anesthesia   . Fracture of pelvis ([place])   . GERD (gastroesophageal reflux disease)   . Hx of chest pain    "anxiety"  . Hyperlipidemia   . INSOMNIA 10/14/2008   in past  . Joint pain   . Kidney problem   . Low blood sugar    eats several smaller meals a day  . Obesity   . Palpitations    Stress related   . Proteinuria    H/O  . Tobacco abuse    quit smoking 08/2013   Patient Active Problem List   Diagnosis Date Noted  . Snoring 01/06/2020  . CKD (chronic kidney disease), stage III 02/14/2019  . Osteitis condensans 06/25/2018  . Osteoarthritis of spine with radiculopathy, cervical region 02/12/2018  . Intertrigo 01/10/2018  . Pes anserinus bursitis of left knee 01/02/2018  . Osteitis pubis ([place]) 12/19/2017  . Prediabetes 10/22/2017  . Other fatigue 09/18/2017  . Shortness of breath on exertion 09/18/2017  . Vitamin D deficiency 09/18/2017  . Hyperglycemia 09/18/2017  . Class 2 obesity due to excess calories without serious comorbidity with body mass index (BMI) of 39.0 to 39.9 in adult 06/13/2017  . Atypical chest pain 03/26/2017  . Depression 12/19/2016  . Anxiety state 07/20/2016  . Essential hypertension 01/04/2016  . Eczema 06/29/2015  . Gout 03/12/2015  . S/p nephrectomy 03/12/2015  . Hyperlipidemia 03/12/2015  . Tinnitus 03/12/2015  . COPD (chronic obstructive pulmonary disease) ([place]) 03/12/2015  . History of colonic polyps 03/12/2015  . Bimalleolar fracture 07/02/2014  . Arthritis   . Former smoker   . GERD 01/23/2008  .  MICROALBUMINURIA 01/23/2008  . Microscopic hematuria 01/23/2008  . UTI (urinary tract infection) 01/08/2008   Past Surgical History:  Procedure Laterality Date  . ABDOMINAL HYSTERECTOMY    . APPENDECTOMY     with hysterectomy  . childbirth     x4  . CHOLECYSTECTOMY  2001  . EYE SURGERY Bilateral 2015   cataract surgery with lens implant  . NEPHRECTOMY  1992   complex cystic mass - nephrectomy right  . ORIF ANKLE FRACTURE Right 07/02/2014   Procedure: OPEN REDUCTION INTERNAL FIXATION (ORIF) RIGHT ANKLE BIMALLOELAR FRACTURE;  Surgeon: Wylene Simmer, MD;  Location: [facility];  Service: Orthopedics;  Laterality: Right;  . OTHER SURGICAL HISTORY     hysterectomy, R ovary remains  . SHOULDER SURGERY     bone spurs left    Family History  Problem Relation Age of Onset  . Stroke Father   . Alzheimer's disease Father        early, states also parkinsons. died 73  . Hypertension Father   . Obesity Father   . Ovarian cancer Mother        Dr. Jana Hakim   . Arthritis Mother   . Obesity Mother   . Colon cancer Paternal Grandmother   . Liver disease Daughter        On hospice age 39    Medications- reviewed and updated Current Outpatient Medications  Medication Sig Dispense Refill  .  acetaminophen (TYLENOL) 500 MG tablet Take 500 mg by mouth every 6 (six) hours as needed.    [name] allopurinol (ZYLOPRIM) 300 MG tablet TAKE 1 TABLET (300 MG TOTAL) BY MOUTH 2 (TWO) TIMES DAILY. 180 tablet 3  . AMBULATORY NON FORMULARY MEDICATION Rolling walker with a seat 1 Units 0  . aspirin 81 MG tablet Take 81 mg by mouth daily.    [name] CALCIUM-MAGNESIUM-ZINC PO Take by mouth.    . clonazePAM (KLONOPIN) 0.5 MG tablet TAKE 1 TABLET(0.5 MG) BY MOUTH AT BEDTIME AS NEEDED 90 tablet 1  . diazepam (VALIUM) 10 MG tablet Take 1 tablet (10 mg total) by mouth every 6 (six) hours as needed (as needed for muscle spasms). 30 tablet 0  . diclofenac sodium (VOLTAREN) 1 % GEL Apply topically to affected area qid 100 g 2  .  gabapentin (NEURONTIN) 300 MG capsule TAKE 1 CAPSULE BY MOUTH UP TO THREE TIMES DAILY OR AS DIRECTED 270 capsule 1  . lisinopril (ZESTRIL) 2.5 MG tablet Take 1 tablet (2.5 mg total) by mouth daily. 90 tablet 0  . meloxicam (MOBIC) 15 MG tablet TAKE 1 TABLET BY MOUTH DAILY FOR 14 DAYS, THEN AS NEEDED 90 tablet 3  . metFORMIN (GLUCOPHAGE) 500 MG tablet Take 1 tablet (500 mg total) by mouth daily with breakfast. 30 tablet 0  . nystatin cream (MYCOSTATIN) APPLY 1 APPLICATION TOPICALLY TO RASH IN GROIN TWICE DAILY FOR UP TO 2 WEEKS MAXIMUM 90 g 2  . OVER THE COUNTER MEDICATION Cherry extract bid    . OVER THE COUNTER MEDICATION B-12 one daily    . sertraline (ZOLOFT) 50 MG tablet Take 1 tablet (50 mg total) by mouth daily. 90 tablet 3  . simvastatin (ZOCOR) 20 MG tablet TAKE 1 TABLET AT BEDTIME 90 tablet 0  . Sodium Bicarbonate (NICE PURE BAKING SODA) POWD by Does not apply route.    . traMADol (ULTRAM) 50 MG tablet Take 1 tablet (50 mg total) by mouth every 6 (six) hours as needed for moderate pain or severe pain. 60 tablet 0  . triamcinolone cream (KENALOG) 0.1 % Apply 1 application topically 2 (two) times daily. For 7-10 days for recurring eczema issues 80 g 1  . Vitamin D, Ergocalciferol, (DRISDOL) 1.25 MG (50000 UNIT) CAPS capsule Take 1 capsule (50,000 Units total) by mouth 2 (two) times a week. 10 capsule 0   No current facility-administered medications for this visit.    Allergies-reviewed and updated Allergies  Allergen Reactions  . Epinephrine Palpitations  . Codeine Nausea Only  . Penicillins Nausea Only  . Prednisone Cough    Social History   Social History Narrative   Divorced. Found love of her life recently in 2016 ([place]). 3 sons. 5 grandkids.    Daughter (had at age 86 and was adopted). To care for [name] her granddaughter when daughter passes- daughter on hospice.       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 Gen: NAD, resting comfortably HEENT: Mucous membranes are moist. Oropharynx normal Neck: no thyromegaly CV: RRR no murmurs rubs or gallops Lungs: CTAB no crackles, wheeze, rhonchi Abdomen: soft/nontender/nondistended/normal bowel sounds. No rebound or guarding.  Ext: no edema Skin: warm, dry Neuro: grossly  normal, moves all extremities, PERRLA***   Assessment and Plan   74 y.o. female presenting for annual physical.  Health Maintenance counseling: 1. Anticipatory guidance: Patient counseled regarding regular dental exams ***q6 months, eye exams ***,  avoiding smoking and second hand smoke*** , limiting alcohol to 1 beverage per day*** .   2. Risk factor reduction:  Advised patient of need for regular exercise and diet rich and fruits and vegetables to reduce risk of heart attack and stroke. Exercise- ***. Diet-***.  Wt Readings from Last 3 Encounters:  05/25/20 255 lb (115.7 kg)  05/04/20 256 lb (116.1 kg)  04/08/20 246 lb (111.6 kg)   3. Immunizations/screenings/ancillary studies Immunization History  Administered Date(s) Administered  . Influenza, High Dose Seasonal PF 09/29/2016, 09/05/2017, 08/19/2018  . Influenza,inj,Quad PF,6+ Mos 08/24/2015  . Influenza,inj,quad, With Preservative 08/27/2017  . Moderna SARS-COVID-2 Vaccination 02/14/2020, 03/07/2020  . Pneumococcal Conjugate-13 03/12/2015  . Pneumococcal Polysaccharide-23 01/09/2017  . Td 04/24/2016  . Zoster 03/12/2015   Health Maintenance Due  Topic Date Due  . COLONOSCOPY  05/11/2020   4. Cervical cancer screening- *** 5. Breast cancer screening-  breast exam *** and mammogram *** 6. Colon cancer screening - *** 7. Skin cancer screening- ***advised regular sunscreen use. Denies worrisome, changing, or new skin lesions.   8. Birth control/STD check- *** 9. Osteoporosis screening at 80- *** -*** smoker  Status of chronic or acute concerns   #Hyperlipidemia S: Compliant with simvastatin 20 mg A/P: ***    #Hypertension S: Compliant with lisinopril 2.5 mg-though primarily on for renal protective effects with history of microalbuminuria A/P: ***    #Depression-full remission/anxiety S: Compliant with Wellbutrin 200 mg sustained-release to help with weight loss.  She is also on low-dose Zoloft 50 mg.  Big drivers of stress in her life are her husband with mild cognitive impairment, hoarding, change in personality over last few years. She moved out 2 years ago and that has been helpful. They have filed for divorce.   Due to anxiety/stress-uses clonazepam-uses pretty regularly for sleep.  Uses very sparing diazepam for spasms in her neck balance ear.***  A/P: ***  *** No diagnosis found.  Recommended follow up: ***No follow-ups on file. Future Appointments  Date Time Provider [place]  06/08/2020 10:40 AM Marin Olp, MD [facility]  06/21/2020 10:30 AM Abby Potash, PA-C [facility] None  10/25/2020  1:30 PM Ward Givens, NP [facility] None    No chief complaint on file.  Lab/Order associations:*** fasting No diagnosis found.  No orders of the defined types were placed in this encounter.   Return precautions advised.  Clyde Lundborg, CMA

## 2020-06-08 ENCOUNTER — Encounter: Payer: Medicare HMO | Admitting: Family Medicine

## 2020-06-08 DIAGNOSIS — Z0289 Encounter for other administrative examinations: Secondary | ICD-10-CM

## 2020-06-12 ENCOUNTER — Encounter (INDEPENDENT_AMBULATORY_CARE_PROVIDER_SITE_OTHER): Payer: Self-pay | Admitting: Physician Assistant

## 2020-06-21 ENCOUNTER — Ambulatory Visit (INDEPENDENT_AMBULATORY_CARE_PROVIDER_SITE_OTHER): Payer: Medicare HMO | Admitting: Physician Assistant

## 2020-06-25 ENCOUNTER — Encounter: Payer: Self-pay | Admitting: Family Medicine

## 2020-06-25 DIAGNOSIS — R7303 Prediabetes: Secondary | ICD-10-CM

## 2020-06-25 NOTE — Telephone Encounter (Signed)
Spoke to pt told her Dr. Yong Channel is out of the office till [place]. He may send in Rx's before that. You will need to contact [employer] so they can send to correct address. Pt verbalized understanding and said she will contact them.

## 2020-06-28 MED ORDER — METFORMIN HCL 500 MG PO TABS: 500.0000 mg | ORAL_TABLET | Freq: Every day | ORAL | 0 refills | Status: DC

## 2020-06-28 MED ORDER — CLONAZEPAM 0.5 MG PO TABS: ORAL_TABLET | ORAL | 1 refills | Status: DC

## 2020-06-28 MED ORDER — SIMVASTATIN 20 MG PO TABS: 20.0000 mg | ORAL_TABLET | Freq: Every day | ORAL | 0 refills | Status: DC

## 2020-06-28 MED ORDER — GABAPENTIN 300 MG PO CAPS: ORAL_CAPSULE | ORAL | 0 refills | Status: DC

## 2020-06-28 MED ORDER — LISINOPRIL 2.5 MG PO TABS: 2.5000 mg | ORAL_TABLET | Freq: Every day | ORAL | 0 refills | Status: DC

## 2020-07-02 ENCOUNTER — Other Ambulatory Visit (INDEPENDENT_AMBULATORY_CARE_PROVIDER_SITE_OTHER): Payer: Self-pay | Admitting: Physician Assistant

## 2020-07-02 DIAGNOSIS — R7303 Prediabetes: Secondary | ICD-10-CM

## 2020-07-04 DIAGNOSIS — G4733 Obstructive sleep apnea (adult) (pediatric): Secondary | ICD-10-CM | POA: Diagnosis not present

## 2020-07-16 ENCOUNTER — Encounter: Payer: Self-pay | Admitting: Family Medicine

## 2020-07-17 DIAGNOSIS — R05 Cough: Secondary | ICD-10-CM | POA: Diagnosis not present

## 2020-07-30 NOTE — Progress Notes (Signed)
Phone [phone] Virtual visit via Video note   Subjective:  Chief complaint: Chief Complaint  Patient presents with  . Cough    onset: 1 week , dry & productive cough   This visit type was conducted due to national recommendations for restrictions regarding the COVID-19 Pandemic (e.g. social distancing).  This format is felt to be most appropriate for this patient at this time balancing risks to patient and risks to population by having him in for in person visit.  No physical exam was performed (except for noted visual exam or audio findings with Telehealth visits).    Our team/I connected with [name] at 10:00 AM EDT by a video enabled telemedicine application (doxy.me or caregility through epic) and verified that I am speaking with the correct person using two identifiers.  Location patient: [place] Location provider: [facility], office Persons participating in the virtual visit:  patient  Our team/I discussed the limitations of evaluation and management by telemedicine and the availability of in person appointments. In light of current covid-19 pandemic, patient also understands that we are trying to protect them by minimizing in office contact if at all possible.  The patient expressed consent for telemedicine visit and agreed to proceed. Patient understands insurance will be billed.   Past Medical History-  Patient Active Problem List   Diagnosis Date Noted  . Depression 12/19/2016    Priority: High  . Anxiety state 07/20/2016    Priority: High  . S/p nephrectomy 03/12/2015    Priority: High  . Essential hypertension 01/04/2016    Priority: Medium  . Gout 03/12/2015    Priority: Medium  . Hyperlipidemia 03/12/2015    Priority: Medium  . COPD (chronic obstructive pulmonary disease) ([place]) 03/12/2015    Priority: Medium  . Arthritis     Priority: Medium  . Former smoker     Priority: Medium  . MICROALBUMINURIA 01/23/2008    Priority: Medium  . Eczema  06/29/2015    Priority: Low  . Tinnitus 03/12/2015    Priority: Low  . History of colonic polyps 03/12/2015    Priority: Low  . Bimalleolar fracture 07/02/2014    Priority: Low  . GERD 01/23/2008    Priority: Low  . Microscopic hematuria 01/23/2008    Priority: Low  . UTI (urinary tract infection) 01/08/2008    Priority: Low  . Snoring 01/06/2020  . CKD (chronic kidney disease), stage III 02/14/2019  . Osteitis condensans 06/25/2018  . Osteoarthritis of spine with radiculopathy, cervical region 02/12/2018  . Intertrigo 01/10/2018  . Pes anserinus bursitis of left knee 01/02/2018  . Osteitis pubis ([place]) 12/19/2017  . Prediabetes 10/22/2017  . Other fatigue 09/18/2017  . Shortness of breath on exertion 09/18/2017  . Vitamin D deficiency 09/18/2017  . Hyperglycemia 09/18/2017  . Class 2 obesity due to excess calories without serious comorbidity with body mass index (BMI) of 39.0 to 39.9 in adult 06/13/2017  . Atypical chest pain 03/26/2017    Medications- reviewed and updated Current Outpatient Medications  Medication Sig Dispense Refill  . acetaminophen (TYLENOL) 500 MG tablet Take 500 mg by mouth every 6 (six) hours as needed.    [name] allopurinol (ZYLOPRIM) 300 MG tablet TAKE 1 TABLET (300 MG TOTAL) BY MOUTH 2 (TWO) TIMES DAILY. 180 tablet 3  . AMBULATORY NON FORMULARY MEDICATION Rolling walker with a seat 1 Units 0  . aspirin 81 MG tablet Take 81 mg by mouth daily.    [name] CALCIUM-MAGNESIUM-ZINC PO Take by mouth.    [name]  clonazePAM (KLONOPIN) 0.5 MG tablet TAKE 1 TABLET(0.5 MG) BY MOUTH AT BEDTIME AS NEEDED 90 tablet 1  . diazepam (VALIUM) 10 MG tablet Take 1 tablet (10 mg total) by mouth every 6 (six) hours as needed (as needed for muscle spasms). 30 tablet 0  . diclofenac sodium (VOLTAREN) 1 % GEL Apply topically to affected area qid 100 g 2  . gabapentin (NEURONTIN) 300 MG capsule TAKE 1 CAPSULE BY MOUTH UP TO THREE TIMES DAILY OR AS DIRECTED 270 capsule 0  . lisinopril (ZESTRIL)  2.5 MG tablet Take 1 tablet (2.5 mg total) by mouth daily. 90 tablet 0  . meloxicam (MOBIC) 15 MG tablet TAKE 1 TABLET BY MOUTH DAILY FOR 14 DAYS, THEN AS NEEDED 90 tablet 3  . metFORMIN (GLUCOPHAGE) 500 MG tablet Take 1 tablet (500 mg total) by mouth daily with breakfast. 90 tablet 0  . nystatin cream (MYCOSTATIN) APPLY 1 APPLICATION TOPICALLY TO RASH IN GROIN TWICE DAILY FOR UP TO 2 WEEKS MAXIMUM 90 g 2  . OVER THE COUNTER MEDICATION Cherry extract bid    . OVER THE COUNTER MEDICATION B-12 one daily    . sertraline (ZOLOFT) 50 MG tablet Take 1 tablet (50 mg total) by mouth daily. 90 tablet 3  . simvastatin (ZOCOR) 20 MG tablet Take 1 tablet (20 mg total) by mouth at bedtime. 90 tablet 0  . Sodium Bicarbonate (NICE PURE BAKING SODA) POWD by Does not apply route.    . traMADol (ULTRAM) 50 MG tablet Take 1 tablet (50 mg total) by mouth every 6 (six) hours as needed for moderate pain or severe pain. 60 tablet 0  . triamcinolone cream (KENALOG) 0.1 % Apply 1 application topically 2 (two) times daily. For 7-10 days for recurring eczema issues 80 g 1  . Vitamin D, Ergocalciferol, (DRISDOL) 1.25 MG (50000 UNIT) CAPS capsule Take 1 capsule (50,000 Units total) by mouth 2 (two) times a week. 10 capsule 0   No current facility-administered medications for this visit.     Objective:  BP 110/70 Comment: unable to obtain self reported vitals Gen: NAD, resting comfortably Lungs: nonlabored, normal respiratory rate  Skin: appears dry, no obvious rash    Assessment and Plan  #Hyperlipidemia S: Compliant with simvastatin 20 mg Lab Results  Component Value Date   CHOL 212 (H) 05/04/2020   HDL 63 05/04/2020   LDLCALC 112 (H) 05/04/2020   LDLDIRECT 77.0 01/18/2017   TRIG 215 (H) 05/04/2020   CHOLHDL 2 01/18/2017  A/P: Poor control with LDL over 100-she will double up simvastatin until she finishes and then she will start atorvastatin 20 mg daily   #Hypertension S: Compliant with lisinopril 2.5  mg-though primarily on for renal protective effects with history of microalbuminuria and unilateral kidney A/P: thankfully blood pressure controlled despite sudafed especially with unilateral kidney   # cough /sinus pressure S: Patient with 2 week of cough-sometimes dry-sometimes productive. Went to urgent care and tested for covid and was negative. Was advised flonase and sudafed. No Fever.   She has sold her house in [place] and is [place] but comes back to visit her friend here- staying with her right now.   She is fully vaccinated from COVID-19 as of March 07, 2020. She is having sinus pressure as well as discharge A/P: 2 weeks of cough/sinus pressure-could be bacterial sinusitis.  Penicillin allergy-would typically use Augmentin.  She states she tolerates azithromycin well and prefers trying this over doxycycline-she will follow-up if fails to improve.  Could consider round of prednisone  #Depression-full remission/anxiety S: off Wellbutrin 200 mg sustained-release to help with weight loss.  She was also on low-dose Zoloft 50 mg- weaned off in 2021  Big drivers of stress in her life are her husband with mild cognitive impairment, hoarding, change in personality over last few years. She moved out 2 years ago and that has been helpful. They have filed for divorce.   Due to anxiety/stress-uses clonazepam-uses pretty regularly for sleep.   A/P: Depression in full remission with PHQ-9 of 1-please she is doing okay despite weaning off of Zoloft.  Has not been able to wean off clonazepam for sleep  Recommended follow up: As needed for acute concerns-she is going to establish with a new doctor in [place] Future Appointments  Date Time Provider [place]  08/02/2020 12:30 PM Abby Potash, PA-C [facility] None  10/25/2020  1:30 PM Ward Givens, NP [facility] None    Lab/Order associations:   ICD-10-CM   1. Screening exam for skin cancer  Z12.83 Ambulatory referral to Dermatology  2.  Bacterial sinusitis  J32.9    B96.89   3. Essential hypertension  I10   4. Hyperlipidemia, unspecified hyperlipidemia type  E78.5   5. Recurrent major depressive disorder, in full remission ([place])  F33.42     Meds ordered this encounter  Medications  . azithromycin (ZITHROMAX) 250 MG tablet    Sig: Take 2 tabs on day 1, then 1 tab daily until finished    Dispense:  6 tablet    Refill:  0  . atorvastatin (LIPITOR) 20 MG tablet    Sig: Take 1 tablet (20 mg total) by mouth daily.    Dispense:  90 tablet    Refill:  3     Return precautions advised.  Garret Reddish, MD

## 2020-07-31 ENCOUNTER — Telehealth (INDEPENDENT_AMBULATORY_CARE_PROVIDER_SITE_OTHER): Payer: Medicare HMO | Admitting: Family Medicine

## 2020-07-31 ENCOUNTER — Encounter: Payer: Self-pay | Admitting: Family Medicine

## 2020-07-31 VITALS — BP 110/70

## 2020-07-31 DIAGNOSIS — B9689 Other specified bacterial agents as the cause of diseases classified elsewhere: Secondary | ICD-10-CM

## 2020-07-31 DIAGNOSIS — E785 Hyperlipidemia, unspecified: Secondary | ICD-10-CM | POA: Diagnosis not present

## 2020-07-31 DIAGNOSIS — Z1283 Encounter for screening for malignant neoplasm of skin: Secondary | ICD-10-CM | POA: Diagnosis not present

## 2020-07-31 DIAGNOSIS — I10 Essential (primary) hypertension: Secondary | ICD-10-CM

## 2020-07-31 DIAGNOSIS — F3342 Major depressive disorder, recurrent, in full remission: Secondary | ICD-10-CM | POA: Diagnosis not present

## 2020-07-31 DIAGNOSIS — J329 Chronic sinusitis, unspecified: Secondary | ICD-10-CM | POA: Diagnosis not present

## 2020-07-31 MED ORDER — ATORVASTATIN CALCIUM 20 MG PO TABS: 20.0000 mg | ORAL_TABLET | Freq: Every day | ORAL | 3 refills | Status: DC

## 2020-07-31 MED ORDER — AZITHROMYCIN 250 MG PO TABS: ORAL_TABLET | ORAL | 0 refills | Status: DC

## 2020-08-02 ENCOUNTER — Encounter (INDEPENDENT_AMBULATORY_CARE_PROVIDER_SITE_OTHER): Payer: Self-pay | Admitting: Physician Assistant

## 2020-08-02 ENCOUNTER — Ambulatory Visit (INDEPENDENT_AMBULATORY_CARE_PROVIDER_SITE_OTHER): Payer: Medicare HMO | Admitting: Physician Assistant

## 2020-08-02 ENCOUNTER — Other Ambulatory Visit: Payer: Self-pay

## 2020-08-02 VITALS — BP 103/67 | HR 84 | Temp 97.7°F | Ht 68.0 in | Wt 258.0 lb

## 2020-08-02 DIAGNOSIS — R7303 Prediabetes: Secondary | ICD-10-CM | POA: Diagnosis not present

## 2020-08-02 DIAGNOSIS — E559 Vitamin D deficiency, unspecified: Secondary | ICD-10-CM | POA: Diagnosis not present

## 2020-08-02 DIAGNOSIS — E119 Type 2 diabetes mellitus without complications: Secondary | ICD-10-CM | POA: Diagnosis not present

## 2020-08-02 DIAGNOSIS — Z6839 Body mass index (BMI) 39.0-39.9, adult: Secondary | ICD-10-CM | POA: Diagnosis not present

## 2020-08-02 DIAGNOSIS — H18593 Other hereditary corneal dystrophies, bilateral: Secondary | ICD-10-CM | POA: Diagnosis not present

## 2020-08-02 DIAGNOSIS — Z961 Presence of intraocular lens: Secondary | ICD-10-CM | POA: Diagnosis not present

## 2020-08-02 LAB — HM DIABETES EYE EXAM

## 2020-08-03 DIAGNOSIS — Z961 Presence of intraocular lens: Secondary | ICD-10-CM | POA: Diagnosis not present

## 2020-08-03 MED ORDER — METFORMIN HCL ER (MOD) 1000 MG PO TB24: 1000.0000 mg | ORAL_TABLET | Freq: Every day | ORAL | 0 refills | Status: DC

## 2020-08-03 MED ORDER — VITAMIN D (ERGOCALCIFEROL) 1.25 MG (50000 UNIT) PO CAPS: 50000.0000 [IU] | ORAL_CAPSULE | ORAL | 0 refills | Status: DC

## 2020-08-03 NOTE — Progress Notes (Signed)
Chief Complaint:   OBESITY Angie Velasquez is here to discuss her progress with her obesity treatment plan along with follow-up of her obesity related diagnoses. Angie Velasquez is on the Category 2 Plan and states she is following her eating plan approximately 50% of the time. Angie Velasquez states she is walking 10 minutes 3 times per week.  Today's visit was #: 51 Starting weight: 249 lbs Starting date: 09/18/2017 Today's weight: 258 lbs Today's date: 08/02/2020 Total lbs lost to date: 0 Total lbs lost since last in-office visit: 0  Interim History: Angie Velasquez reports that she has been back and forth to [place] and continues to take care of her friend who is in Hospice care. She is not eating enough protein. She states she just bought a house in [place] and is excited.  Subjective:   Vitamin D deficiency. Angie Velasquez is on prescription Vitamin D supplementation. No nausea, vomiting, or muscle weakness.    Ref. Range 05/04/2020 14:41  Vitamin D, 25-Hydroxy Latest Ref Range: 30.0 - 100.0 ng/mL 40.4   Prediabetes. Angie Velasquez has a diagnosis of prediabetes based on her elevated HgA1c and was informed this puts her at greater risk of developing diabetes. She continues to work on diet and exercise to decrease her risk of diabetes. She denies nausea or hypoglycemia. No polyphagia. Angie Velasquez does report diarrhea with regular metformin.  Lab Results  Component Value Date   HGBA1C 5.9 (H) 05/04/2020   Lab Results  Component Value Date   INSULIN 26.9 (H) 05/04/2020   INSULIN 23.0 12/31/2019   INSULIN 25.2 (H) 08/28/2019   INSULIN 18.6 01/09/2019   INSULIN 22.0 07/25/2018   Assessment/Plan:   Vitamin D deficiency. Low Vitamin D level contributes to fatigue and are associated with obesity, breast, and colon cancer. She was given a refill on her Vitamin D, Ergocalciferol, (DRISDOL) 1.25 MG (50000 UNIT) CAPS capsule twice weekly #30 with 0 refills and will follow-up for routine testing of  Vitamin D, at least 2-3 times per year to avoid over-replacement.   Prediabetes. Angie Velasquez will continue to work on weight loss, exercise, and decreasing simple carbohydrates to help decrease the risk of diabetes. She will change to metFORMIN XR (GLUMETZA) 1000 MG (MOD) 24 hr tablet #90 with 0 refills.  Class 2 severe obesity with serious comorbidity and body mass index (BMI) of 39.0 to 39.9 in adult, unspecified obesity type (Angie Velasquez).  Angie Velasquez is currently in the action stage of change. As such, her goal is to continue with weight loss efforts. She has agreed to the Category 2 Plan.   Exercise goals: Angie Velasquez should follow the adult guidelines. When Angie Velasquez cannot meet the adult guidelines, they should be as physically active as their abilities and conditions will allow.   Behavioral modification strategies: increasing lean protein intake and no skipping meals.  Angie Velasquez has agreed to follow-up with our clinic in 12 weeks. She was informed of the importance of frequent follow-up visits to maximize her success with intensive lifestyle modifications for her multiple health conditions.   Objective:   Blood pressure 103/67, pulse 84, temperature 97.7 F (36.5 C), height 5\' 8"  (1.727 m), weight 258 lb (117 kg), SpO2 96 %. Body mass index is 39.23 kg/m.  General: Cooperative, alert, well developed, in no acute distress. HEENT: Conjunctivae and lids unremarkable. Cardiovascular: Regular rhythm.  Lungs: Normal work of breathing. Neurologic: No focal deficits.   Lab Results  Component Value Date   CREATININE 1.03 (H) 05/04/2020   BUN 18 05/04/2020  NA 140 05/04/2020   K 4.2 05/04/2020   CL 101 05/04/2020   CO2 24 05/04/2020   Lab Results  Component Value Date   ALT 22 05/04/2020   AST 20 05/04/2020   ALKPHOS 104 05/04/2020   BILITOT 0.2 05/04/2020   Lab Results  Component Value Date   HGBA1C 5.9 (H) 05/04/2020   HGBA1C 5.9 (H) 12/31/2019   HGBA1C 5.8 (H) 08/28/2019    HGBA1C 5.7 (H) 01/09/2019   HGBA1C 5.8 (H) 07/25/2018   Lab Results  Component Value Date   INSULIN 26.9 (H) 05/04/2020   INSULIN 23.0 12/31/2019   INSULIN 25.2 (H) 08/28/2019   INSULIN 18.6 01/09/2019   INSULIN 22.0 07/25/2018   Lab Results  Component Value Date   TSH 3.430 09/18/2017   Lab Results  Component Value Date   CHOL 212 (H) 05/04/2020   HDL 63 05/04/2020   LDLCALC 112 (H) 05/04/2020   LDLDIRECT 77.0 01/18/2017   TRIG 215 (H) 05/04/2020   CHOLHDL 2 01/18/2017   Lab Results  Component Value Date   WBC 7.1 08/28/2019   HGB 13.4 08/28/2019   HCT 40.3 08/28/2019   MCV 90 08/28/2019   PLT 242 08/28/2019   No results found for: IRON, TIBC, FERRITIN  Obesity Behavioral Intervention Documentation for Insurance:   Approximately 15 minutes were spent on the discussion below.  ASK: We discussed the diagnosis of obesity with Angie Velasquez today and Angie Velasquez agreed to give [name] permission to discuss obesity behavioral modification therapy today.  ASSESS: Angie Velasquez has the diagnosis of obesity and her BMI today is 39.3. Angie Velasquez is in the action stage of change.   ADVISE: Angie Velasquez was educated on the multiple health risks of obesity as well as the benefit of weight loss to improve her health. She was advised of the need for long term treatment and the importance of lifestyle modifications to improve her current health and to decrease her risk of future health problems.  AGREE: Multiple dietary modification options and treatment options were discussed and Angie Velasquez agreed to follow the recommendations documented in the above note.  ARRANGE: Angie Velasquez was educated on the importance of frequent visits to treat obesity as outlined per CMS and USPSTF guidelines and agreed to schedule her next follow up appointment today.  Attestation Statements:   Reviewed by clinician on day of visit: allergies, medications, problem list, medical history, surgical history, family history, social  history, and previous encounter notes.  IMichaelene Song, am acting as transcriptionist for Abby Potash, PA-C   I have reviewed the above documentation for accuracy and completeness, and I agree with the above. Abby Potash, PA-C

## 2020-08-04 DIAGNOSIS — G4733 Obstructive sleep apnea (adult) (pediatric): Secondary | ICD-10-CM | POA: Diagnosis not present

## 2020-08-05 ENCOUNTER — Telehealth: Payer: Self-pay | Admitting: Family Medicine

## 2020-08-05 NOTE — Telephone Encounter (Signed)
Patient returned call regarding her name change she stated she got divorced last march and her name has changed backed to [place] and will fax a copy of her license or divorce papers if needed, she said you can leave her a VM with fax number is she needs it.

## 2020-08-12 DIAGNOSIS — D225 Melanocytic nevi of trunk: Secondary | ICD-10-CM | POA: Diagnosis not present

## 2020-08-12 DIAGNOSIS — D1801 Hemangioma of skin and subcutaneous tissue: Secondary | ICD-10-CM | POA: Diagnosis not present

## 2020-08-12 DIAGNOSIS — L720 Epidermal cyst: Secondary | ICD-10-CM | POA: Diagnosis not present

## 2020-08-12 DIAGNOSIS — L814 Other melanin hyperpigmentation: Secondary | ICD-10-CM | POA: Diagnosis not present

## 2020-08-12 DIAGNOSIS — L918 Other hypertrophic disorders of the skin: Secondary | ICD-10-CM | POA: Diagnosis not present

## 2020-08-12 DIAGNOSIS — L738 Other specified follicular disorders: Secondary | ICD-10-CM | POA: Diagnosis not present

## 2020-08-12 DIAGNOSIS — L821 Other seborrheic keratosis: Secondary | ICD-10-CM | POA: Diagnosis not present

## 2020-08-19 ENCOUNTER — Telehealth (INDEPENDENT_AMBULATORY_CARE_PROVIDER_SITE_OTHER): Payer: Self-pay | Admitting: Physician Assistant

## 2020-08-19 ENCOUNTER — Encounter (INDEPENDENT_AMBULATORY_CARE_PROVIDER_SITE_OTHER): Payer: Self-pay

## 2020-08-19 DIAGNOSIS — R7303 Prediabetes: Secondary | ICD-10-CM

## 2020-08-19 NOTE — Telephone Encounter (Signed)
Please advise 

## 2020-08-19 NOTE — Telephone Encounter (Signed)
Pt called to get the status of her medication alternative that should have been called into [facility], please give pt a call regarding this matter at [phone].

## 2020-08-23 MED ORDER — METFORMIN HCL 500 MG PO TABS: 500.0000 mg | ORAL_TABLET | Freq: Two times a day (BID) | ORAL | 0 refills | Status: DC

## 2020-08-23 NOTE — Telephone Encounter (Signed)
DC her Metformin XR and change her to regular metformin 500mg  PO BID please #180 ORF and just be sure to explain that she is going to have to take it twice daily please. She may need to put an alarm in her phone to remind her to tale it(just as an idea). Thanks!

## 2020-08-23 NOTE — Telephone Encounter (Signed)
Please advise 

## 2020-08-23 NOTE — Telephone Encounter (Signed)
Can we find out if her insurance will cover a different dose extended release then?

## 2020-08-24 ENCOUNTER — Encounter: Payer: Self-pay | Admitting: Family Medicine

## 2020-08-25 ENCOUNTER — Other Ambulatory Visit: Payer: Self-pay

## 2020-08-25 MED ORDER — DOXYCYCLINE HYCLATE 100 MG PO TABS: 100.0000 mg | ORAL_TABLET | Freq: Two times a day (BID) | ORAL | 0 refills | Status: DC

## 2020-08-26 ENCOUNTER — Other Ambulatory Visit: Payer: Self-pay

## 2020-08-26 MED ORDER — DOXYCYCLINE HYCLATE 100 MG PO TABS: 100.0000 mg | ORAL_TABLET | Freq: Two times a day (BID) | ORAL | 0 refills | Status: DC

## 2020-08-26 NOTE — Telephone Encounter (Signed)
fyi

## 2020-08-26 NOTE — Telephone Encounter (Signed)
No I will reach out to her again.

## 2020-08-26 NOTE — Telephone Encounter (Signed)
No word back from her as of yet?

## 2020-09-03 DIAGNOSIS — G4733 Obstructive sleep apnea (adult) (pediatric): Secondary | ICD-10-CM | POA: Diagnosis not present

## 2020-09-20 ENCOUNTER — Other Ambulatory Visit: Payer: Self-pay | Admitting: Family Medicine

## 2020-09-27 ENCOUNTER — Encounter: Payer: Self-pay | Admitting: Family Medicine

## 2020-09-27 ENCOUNTER — Other Ambulatory Visit: Payer: Self-pay | Admitting: Family Medicine

## 2020-09-27 ENCOUNTER — Encounter (INDEPENDENT_AMBULATORY_CARE_PROVIDER_SITE_OTHER): Payer: Self-pay | Admitting: Physician Assistant

## 2020-09-27 DIAGNOSIS — E559 Vitamin D deficiency, unspecified: Secondary | ICD-10-CM

## 2020-09-28 NOTE — Telephone Encounter (Signed)
I have scheduled patient for my chart visit on 10/28 for fever blisters and cough.    CT will allow a mychart visit.    I have also informed patient that Dr. Yong Channel does suggest for her to be evaluated in person in [place] for cough.   Patient states she is currently trying to find an MD.

## 2020-09-28 NOTE — Telephone Encounter (Signed)
FYI- I will be sending refill protocol in your folder to sign for the Vit D

## 2020-09-29 MED ORDER — VITAMIN D (ERGOCALCIFEROL) 1.25 MG (50000 UNIT) PO CAPS: 50000.0000 [IU] | ORAL_CAPSULE | ORAL | 0 refills | Status: DC

## 2020-09-29 NOTE — Progress Notes (Deleted)
Phone [phone] Virtual visit via Video note   Subjective:  Chief complaint: No chief complaint on file.   This visit type was conducted due to national recommendations for restrictions regarding the COVID-19 Pandemic (e.g. social distancing).  This format is felt to be most appropriate for this patient at this time balancing risks to patient and risks to population by having him in for in person visit.  No physical exam was performed (except for noted visual exam or audio findings with Telehealth visits).    Our team/I connected with Angie Velasquez at  1:00 PM EDT by a video enabled telemedicine application (doxy.me or caregility through epic) and verified that I am speaking with the correct person using two identifiers.  Location patient: [place] Location provider: [facility], office Persons participating in the virtual visit:  patient  Our team/I discussed the limitations of evaluation and management by telemedicine and the availability of in person appointments. In light of current covid-19 pandemic, patient also understands that we are trying to protect them by minimizing in office contact if at all possible.  The patient expressed consent for telemedicine visit and agreed to proceed. Patient understands insurance will be billed.   Past Medical History-  Patient Active Problem List   Diagnosis Date Noted  . Snoring 01/06/2020  . CKD (chronic kidney disease), stage III ([place]) 02/14/2019  . Osteitis condensans 06/25/2018  . Osteoarthritis of spine with radiculopathy, cervical region 02/12/2018  . Intertrigo 01/10/2018  . Pes anserinus bursitis of left knee 01/02/2018  . Osteitis pubis ([place]) 12/19/2017  . Prediabetes 10/22/2017  . Other fatigue 09/18/2017  . Shortness of breath on exertion 09/18/2017  . Vitamin D deficiency 09/18/2017  . Hyperglycemia 09/18/2017  . Class 2 obesity due to excess calories without serious comorbidity with body mass index (BMI) of  39.0 to 39.9 in adult 06/13/2017  . Atypical chest pain 03/26/2017  . Depression 12/19/2016  . Anxiety state 07/20/2016  . Essential hypertension 01/04/2016  . Eczema 06/29/2015  . Gout 03/12/2015  . S/p nephrectomy 03/12/2015  . Hyperlipidemia 03/12/2015  . Tinnitus 03/12/2015  . COPD (chronic obstructive pulmonary disease) ([place]) 03/12/2015  . History of colonic polyps 03/12/2015  . Bimalleolar fracture 07/02/2014  . Arthritis   . Former smoker   . GERD 01/23/2008  . MICROALBUMINURIA 01/23/2008  . Microscopic hematuria 01/23/2008  . UTI (urinary tract infection) 01/08/2008    Medications- reviewed and updated Current Outpatient Medications  Medication Sig Dispense Refill  . acetaminophen (TYLENOL) 500 MG tablet Take 500 mg by mouth every 6 (six) hours as needed.    [name] allopurinol (ZYLOPRIM) 300 MG tablet TAKE 1 TABLET (300 MG TOTAL) BY MOUTH 2 (TWO) TIMES DAILY. 180 tablet 3  . AMBULATORY NON FORMULARY MEDICATION Rolling walker with a seat 1 Units 0  . aspirin 81 MG tablet Take 81 mg by mouth daily.    [name] atorvastatin (LIPITOR) 20 MG tablet Take 1 tablet (20 mg total) by mouth daily. 90 tablet 3  . azithromycin (ZITHROMAX) 250 MG tablet Take 2 tabs on day 1, then 1 tab daily until finished 6 tablet 0  . CALCIUM-MAGNESIUM-ZINC PO Take by mouth.    . clonazePAM (KLONOPIN) 0.5 MG tablet TAKE 1 TABLET(0.5 MG) BY MOUTH AT BEDTIME AS NEEDED 90 tablet 1  . diazepam (VALIUM) 10 MG tablet Take 1 tablet (10 mg total) by mouth every 6 (six) hours as needed (as needed for muscle spasms). 30 tablet 0  . diclofenac sodium (VOLTAREN) 1 %  GEL Apply topically to affected area qid 100 g 2  . gabapentin (NEURONTIN) 300 MG capsule TAKE 1 CAPSULE BY MOUTH UP TO THREE TIMES DAILY OR AS DIRECTED 270 capsule 0  . lisinopril (ZESTRIL) 2.5 MG tablet TAKE 1 TABLET EVERY DAY 90 tablet 0  . meloxicam (MOBIC) 15 MG tablet TAKE 1 TABLET BY MOUTH DAILY FOR 14 DAYS, THEN AS NEEDED 90 tablet 3  . metFORMIN  (GLUCOPHAGE) 500 MG tablet Take 1 tablet (500 mg total) by mouth 2 (two) times daily with a meal. 180 tablet 0  . nystatin cream (MYCOSTATIN) APPLY 1 APPLICATION TOPICALLY TO RASH IN GROIN TWICE DAILY FOR UP TO 2 WEEKS MAXIMUM 90 g 2  . OVER THE COUNTER MEDICATION Cherry extract bid    . OVER THE COUNTER MEDICATION B-12 one daily    . simvastatin (ZOCOR) 20 MG tablet TAKE 1 TABLET AT BEDTIME 90 tablet 3  . Sodium Bicarbonate (NICE PURE BAKING SODA) POWD by Does not apply route.    . traMADol (ULTRAM) 50 MG tablet Take 1 tablet (50 mg total) by mouth every 6 (six) hours as needed for moderate pain or severe pain. 60 tablet 0  . triamcinolone cream (KENALOG) 0.1 % Apply 1 application topically 2 (two) times daily. For 7-10 days for recurring eczema issues 80 g 1  . [START ON 09/30/2020] Vitamin D, Ergocalciferol, (DRISDOL) 1.25 MG (50000 UNIT) CAPS capsule Take 1 capsule (50,000 Units total) by mouth 2 (two) times a week. 24 capsule 0   No current facility-administered medications for this visit.     Objective:  There were no vitals taken for this visit. self reported vitals Gen: NAD, resting comfortably Lungs: nonlabored, normal respiratory rate *** Skin: appears dry, no obvious rash     Assessment and Plan  #Hyperlipidemia S: Compliant with simvastatin 20 mg A/P: ***    #Hypertension S: Compliant with lisinopril 2.5 mg-though primarily on for renal protective effects with history of microalbuminuria A/P: ***    #Depression-full remission/anxiety S: On clonazepam as needed only Previously-Wellbutrin 200 mg sustained-release to help with weight loss, was s also on low-dose Zoloft 50 mg but weaned off in 2021  Big drivers of stress in her life are her husband with mild cognitive impairment, hoarding, change in personality over last few years. She moved out 2 years ago and that has been helpful.  They are now divorced  Due to anxiety/stress-uses clonazepam-uses pretty regularly for  sleep.  Uses very sparing diazepam for spasms in her neck balance ear.***  A/P: ***  Cough S:***  A/P: ***   Fever Blisters S:***  A/P: ***     Recommended follow up: *** Future Appointments  Date Time Provider [place]  09/30/2020  1:00 PM Marin Olp, MD [facility]  10/25/2020  1:30 PM Ward Givens, NP [facility] None    Lab/Order associations: No diagnosis found.  No orders of the defined types were placed in this encounter.   Time Spent: *** minutes of total time (3:04 PM***- 3:04 PM***) was spent on the date of the encounter performing the following actions: chart review prior to seeing the patient, obtaining history, performing a medically necessary exam, counseling on the treatment plan, placing orders, and documenting in our EHR.   Return precautions advised.  Clyde Lundborg, CMA

## 2020-09-30 ENCOUNTER — Telehealth: Payer: Medicare HMO | Admitting: Family Medicine

## 2020-10-04 DIAGNOSIS — G4733 Obstructive sleep apnea (adult) (pediatric): Secondary | ICD-10-CM | POA: Diagnosis not present

## 2020-10-17 DIAGNOSIS — J9801 Acute bronchospasm: Secondary | ICD-10-CM | POA: Diagnosis not present

## 2020-10-17 DIAGNOSIS — J069 Acute upper respiratory infection, unspecified: Secondary | ICD-10-CM | POA: Diagnosis not present

## 2020-10-17 DIAGNOSIS — R059 Cough, unspecified: Secondary | ICD-10-CM | POA: Diagnosis not present

## 2020-10-19 ENCOUNTER — Encounter: Payer: Self-pay | Admitting: Family Medicine

## 2020-10-19 ENCOUNTER — Encounter: Payer: Self-pay | Admitting: Neurology

## 2020-10-25 ENCOUNTER — Ambulatory Visit: Payer: Medicare HMO | Admitting: Adult Health

## 2020-11-03 DIAGNOSIS — G4733 Obstructive sleep apnea (adult) (pediatric): Secondary | ICD-10-CM | POA: Diagnosis not present

## 2020-12-01 ENCOUNTER — Encounter: Payer: Self-pay | Admitting: Family Medicine

## 2020-12-03 ENCOUNTER — Other Ambulatory Visit: Payer: Self-pay | Admitting: Family Medicine

## 2020-12-21 ENCOUNTER — Ambulatory Visit: Payer: Self-pay | Admitting: Neurology

## 2020-12-23 ENCOUNTER — Encounter: Payer: Self-pay | Admitting: Neurology

## 2020-12-28 NOTE — Progress Notes (Signed)
Phone [phone] In person visit   Subjective:   Angie Velasquez is a 75 y.o. year old very pleasant female patient who presents for/with See problem oriented charting Chief Complaint  Patient presents with  . Discussing Medication    This visit occurred during the SARS-CoV-2 public health emergency.  Safety protocols were in place, including screening questions prior to the visit, additional usage of staff PPE, and extensive cleaning of exam room while observing appropriate contact time as indicated for disinfecting solutions.   Past Medical History-  Patient Active Problem List   Diagnosis Date Noted  . Depression 12/19/2016    Priority: High  . S/p nephrectomy 03/12/2015    Priority: High  . Anxiety state 12/09/2009    Priority: High  . Essential hypertension 01/04/2016    Priority: Medium  . Gout 03/12/2015    Priority: Medium  . Hyperlipidemia 03/12/2015    Priority: Medium  . COPD (chronic obstructive pulmonary disease) ([place]) 03/12/2015    Priority: Medium  . Arthritis     Priority: Medium  . Former smoker     Priority: Medium  . MICROALBUMINURIA 01/23/2008    Priority: Medium  . Eczema 06/29/2015    Priority: Low  . Tinnitus 03/12/2015    Priority: Low  . History of colonic polyps 03/12/2015    Priority: Low  . Bimalleolar fracture 07/02/2014    Priority: Low  . GERD 01/23/2008    Priority: Low  . Microscopic hematuria 01/23/2008    Priority: Low  . UTI (urinary tract infection) 01/08/2008    Priority: Low  . Snoring 01/06/2020  . CKD (chronic kidney disease), stage III ([place]) 02/14/2019  . Osteitis condensans 06/25/2018  . Osteoarthritis of spine with radiculopathy, cervical region 02/12/2018  . Intertrigo 01/10/2018  . Pes anserinus bursitis of left knee 01/02/2018  . Osteitis pubis ([place]) 12/19/2017  . Prediabetes 10/22/2017  . Other fatigue 09/18/2017  . Shortness of breath on exertion 09/18/2017  . Hyperglycemia 09/18/2017  .  Class 2 obesity due to excess calories without serious comorbidity with body mass index (BMI) of 39.0 to 39.9 in adult 06/13/2017  . Atypical chest pain 03/26/2017  . Other and unspecified hyperlipidemia 12/09/2009  . Vitamin D deficiency 05/28/2009  . Benign neoplasm of colon 05/26/2009  . Hematuria, unspecified 05/26/2009  . Contact dermatitis and other eczema, due to unspecified cause 05/26/2009  . Arthropathy 05/26/2009  . Insomnia, unspecified 04/27/2009  . DDD (degenerative disc disease), lumbosacral 01/20/2009  . Hearing loss 01/20/2009  . Obesity, unspecified 01/20/2009  . Sialoadenitis 01/20/2009    Medications- reviewed and updated Current Outpatient Medications  Medication Sig Dispense Refill  . acetaminophen (TYLENOL) 500 MG tablet Take 500 mg by mouth every 6 (six) hours as needed.    [name] albuterol (VENTOLIN HFA) 108 (90 Base) MCG/ACT inhaler     . allopurinol (ZYLOPRIM) 300 MG tablet TAKE 1 TABLET (300 MG TOTAL) BY MOUTH 2 (TWO) TIMES DAILY. 180 tablet 3  . AMBULATORY NON FORMULARY MEDICATION Rolling walker with a seat 1 Units 0  . CALCIUM-MAGNESIUM-ZINC PO Take by mouth.    . clonazePAM (KLONOPIN) 0.5 MG tablet TAKE 1 TABLET(0.5 MG) BY MOUTH AT BEDTIME AS NEEDED 90 tablet 1  . diazepam (VALIUM) 10 MG tablet Take 1 tablet (10 mg total) by mouth every 6 (six) hours as needed (as needed for muscle spasms). 30 tablet 0  . diclofenac sodium (VOLTAREN) 1 % GEL Apply topically to affected area qid 100 g 2  .  gabapentin (NEURONTIN) 300 MG capsule TAKE 1 CAPSULE BY MOUTH UP TO THREE TIMES DAILY OR AS DIRECTED 270 capsule 0  . lisinopril (ZESTRIL) 2.5 MG tablet TAKE 1 TABLET EVERY DAY 90 tablet 0  . meloxicam (MOBIC) 15 MG tablet TAKE 1 TABLET BY MOUTH DAILY FOR 14 DAYS, THEN AS NEEDED 90 tablet 3  . metoCLOPramide (REGLAN) 10 MG tablet Take 1 tablet (10 mg total) by mouth daily as needed for nausea. 90 tablet 3  . nystatin cream (MYCOSTATIN) APPLY 1 APPLICATION TOPICALLY TO RASH IN  GROIN TWICE DAILY FOR UP TO 2 WEEKS MAXIMUM 90 g 2  . OVER THE COUNTER MEDICATION Cherry extract bid    . OVER THE COUNTER MEDICATION B-12 one daily    . PRILOSEC 20 MG capsule Take 1 capsule (20 mg total) by mouth daily. 90 capsule 3  . simvastatin (ZOCOR) 20 MG tablet TAKE 1 TABLET AT BEDTIME 90 tablet 3  . Sodium Bicarbonate POWD by Does not apply route.    . triamcinolone cream (KENALOG) 0.1 % Apply 1 application topically 2 (two) times daily. For 7-10 days for recurring eczema issues 80 g 1  . valACYclovir (VALTREX) 1000 MG tablet Take 2 pills twice a day for 1 day at first sign of cold sore 28 tablet 1  . Vitamin D, Ergocalciferol, (DRISDOL) 1.25 MG (50000 UNIT) CAPS capsule Take 1 capsule (50,000 Units total) by mouth 2 (two) times a week. 24 capsule 0  . traMADol (ULTRAM) 50 MG tablet Take 1 tablet (50 mg total) by mouth every 6 (six) hours as needed for moderate pain or severe pain. 60 tablet 0   No current facility-administered medications for this visit.     Objective:  BP 110/84   Pulse 81   Temp 98 F (36.7 C) (Temporal)   Ht 5\' 8"  (1.727 m)   SpO2 96%   BMI 39.23 kg/m  Gen: NAD, resting comfortably CV: RRR no murmurs rubs or gallops Lungs: CTAB no crackles, wheeze, rhonchi Ext: no edema Skin: warm, dry    Assessment and Plan   #social update- lost friend who had stage IV breast cancer on Monday of this week. Patient living in [place] but came back in town to care for her and was able to be with her towards the end. Will be here through end of February then permanently in [place].   #Hyperlipidemia S: Compliant with simvastatin 20 mg Lab Results  Component Value Date   CHOL 212 (H) 05/04/2020   HDL 63 05/04/2020   LDLCALC 112 (H) 05/04/2020   LDLDIRECT 77.0 01/18/2017   TRIG 215 (H) 05/04/2020   CHOLHDL 2 01/18/2017  A/P: hopefully controlled lipids- will come back fasting- continue current meds    #Hypertension S: Compliant with lisinopril 2.5  mg-though primarily on for renal protective effects with history of microalbuminuria A/P: well controlled- continue lisinopril 2.5 mg   #Gout S: 0 flares in over a year  on allopurinol 300mg  BID Lab Results  Component Value Date   LABURIC 3.7 05/04/2020  A/P: just checked uric acid June 2021- can hold off as well controlled   # Hyperglycemia/insulin resistance/prediabetes S:  Medication: off metformin- had stomach upset/diarrhea Lab Results  Component Value Date   HGBA1C 5.9 (H) 05/04/2020   HGBA1C 5.9 (H) 12/31/2019   HGBA1C 5.8 (H) 08/28/2019   A/P: hopefully controlled off metformin- continue to work on weight loss  # s/p nephrectomy- patient on meloxicam - takes less than once a month for  osteoitis pubis. Prefer she use trmadol if she can- refilled this #osteitis pubis actually doing better lately with more movement as long as she paces herself- will continue to monitor   #Vitamin D deficiency S: Medication: well controlled last check. On high dose through weight management clinic Last vitamin D Lab Results  Component Value Date   VD25OH 40.4 05/04/2020  A/P: defer to healthy weigh to wellness    #Depression-full remission/anxiety S: On clonazepam as needed only- recently nightly as lost close friend Previously-Wellbutrin 200 mg sustained-release to help with weight loss, was s also on low-dose Zoloft 50 mg but weaned off in 2021  Uses very sparing diazepam for spasms in her neck mainly- knows to avoid taking clonazepam if takes diazpam  Depression screen [facility] 2/9 12/29/2020 07/31/2020 12/09/2019  Decreased Interest 0 0 0  Down, Depressed, Hopeless 0 0 0  PHQ - 2 Score 0 0 0  Altered sleeping 0 1 0  Tired, decreased energy 0 0 0  Change in appetite 0 0 0  Feeling bad or failure about yourself  0 0 0  Trouble concentrating 0 0 0  Moving slowly or fidgety/restless 0 0 0  Suicidal thoughts 0 0 0  PHQ-9 Score 0 1 0  Difficult doing work/chores Not difficult at all Not  difficult at all Not difficult at all  Some recent data might be hidden  A/P: depression well controlled/remission - continue current prn meds for anxiety- very sparing   # COPD- asymptomatic- noted on prior imaging.   # chronic cough- doing better on reflux medicine- prilosec 20mg  and also reglan 10mg  in PM  Recommended follow up: is moving to [place]  Lab/Order associations:   ICD-10-CM   1. Hyperlipidemia, unspecified hyperlipidemia type  E78.5 Lipid panel    CBC with Differential/Platelet    Comprehensive metabolic panel  2. Hyperglycemia  R73.9 Hemoglobin A1c  3. Chronic gout without tophus, unspecified cause, unspecified site  [id number] Uric acid  4. Vitamin D deficiency  E55.9 VITAMIN D 25 Hydroxy (Vit-D Deficiency, Fractures)  5. Recurrent major depressive disorder, in full remission ([place])  F33.42   6. Anxiety state  F41.1   7. S/p nephrectomy  Z90.5   8. Essential hypertension  I10   9. Chronic obstructive pulmonary disease, unspecified COPD type ([place])  J44.9   10. Osteitis pubis ([facility]) Chronic M86.9     Meds ordered this encounter  Medications  . PRILOSEC 20 MG capsule    Sig: Take 1 capsule (20 mg total) by mouth daily.    Dispense:  90 capsule    Refill:  3  . metoCLOPramide (REGLAN) 10 MG tablet    Sig: Take 1 tablet (10 mg total) by mouth daily as needed for nausea.    Dispense:  90 tablet    Refill:  3  . valACYclovir (VALTREX) 1000 MG tablet    Sig: Take 2 pills twice a day for 1 day at first sign of cold sore    Dispense:  28 tablet    Refill:  1  . traMADol (ULTRAM) 50 MG tablet    Sig: Take 1 tablet (50 mg total) by mouth every 6 (six) hours as needed for moderate pain or severe pain.    Dispense:  60 tablet    Refill:  0    Return precautions advised.  Garret Reddish, MD

## 2020-12-28 NOTE — Patient Instructions (Addendum)
  Health Maintenance Due  Topic Date Due  . COLONOSCOPY  -get in [place] 05/11/2020  . MAMMOGRAM - get in [place] 08/24/2020  . COVID-19 Vaccine (3 - Booster) - consider after funeral  09/06/2020   Please check with your pharmacy to see if they have the shingrix vaccine. If they do- please get this immunization and update [name] by phone call or mychart with dates you receive the vaccine  Schedule a lab visit at the check out desk within 2 weeks. Return for future fasting labs meaning nothing but water after midnight please. Ok to take your medications with water.  Recommended follow up: [facility] treats you incredibly well

## 2020-12-28 NOTE — Telephone Encounter (Signed)
Patient has been scheduled for 12/29/20 at 11:20am.

## 2020-12-29 ENCOUNTER — Encounter: Payer: Self-pay | Admitting: Family Medicine

## 2020-12-29 ENCOUNTER — Ambulatory Visit (INDEPENDENT_AMBULATORY_CARE_PROVIDER_SITE_OTHER): Payer: Medicare HMO | Admitting: Family Medicine

## 2020-12-29 ENCOUNTER — Other Ambulatory Visit: Payer: Self-pay

## 2020-12-29 VITALS — BP 110/84 | HR 81 | Temp 98.0°F | Ht 68.0 in

## 2020-12-29 DIAGNOSIS — Z6839 Body mass index (BMI) 39.0-39.9, adult: Secondary | ICD-10-CM

## 2020-12-29 DIAGNOSIS — F3342 Major depressive disorder, recurrent, in full remission: Secondary | ICD-10-CM

## 2020-12-29 DIAGNOSIS — Z905 Acquired absence of kidney: Secondary | ICD-10-CM

## 2020-12-29 DIAGNOSIS — J449 Chronic obstructive pulmonary disease, unspecified: Secondary | ICD-10-CM | POA: Diagnosis not present

## 2020-12-29 DIAGNOSIS — F411 Generalized anxiety disorder: Secondary | ICD-10-CM

## 2020-12-29 DIAGNOSIS — R69 Illness, unspecified: Secondary | ICD-10-CM | POA: Diagnosis not present

## 2020-12-29 DIAGNOSIS — I10 Essential (primary) hypertension: Secondary | ICD-10-CM | POA: Diagnosis not present

## 2020-12-29 DIAGNOSIS — M869 Osteomyelitis, unspecified: Secondary | ICD-10-CM

## 2020-12-29 DIAGNOSIS — E785 Hyperlipidemia, unspecified: Secondary | ICD-10-CM

## 2020-12-29 DIAGNOSIS — E66812 Obesity, class 2: Secondary | ICD-10-CM

## 2020-12-29 DIAGNOSIS — M898X8 Other specified disorders of bone, other site: Secondary | ICD-10-CM

## 2020-12-29 DIAGNOSIS — M1A9XX0 Chronic gout, unspecified, without tophus (tophi): Secondary | ICD-10-CM | POA: Diagnosis not present

## 2020-12-29 DIAGNOSIS — R739 Hyperglycemia, unspecified: Secondary | ICD-10-CM | POA: Diagnosis not present

## 2020-12-29 DIAGNOSIS — E559 Vitamin D deficiency, unspecified: Secondary | ICD-10-CM | POA: Diagnosis not present

## 2020-12-29 MED ORDER — TRAMADOL HCL 50 MG PO TABS: 50.0000 mg | ORAL_TABLET | Freq: Four times a day (QID) | ORAL | 0 refills | Status: DC | PRN

## 2020-12-29 MED ORDER — VALACYCLOVIR HCL 1 G PO TABS: ORAL_TABLET | ORAL | 1 refills | Status: DC

## 2020-12-29 MED ORDER — METOCLOPRAMIDE HCL 10 MG PO TABS: 10.0000 mg | ORAL_TABLET | Freq: Every day | ORAL | 3 refills | Status: DC | PRN

## 2020-12-29 MED ORDER — PRILOSEC 20 MG PO CPDR: 20.0000 mg | DELAYED_RELEASE_CAPSULE | Freq: Every day | ORAL | 3 refills | Status: DC

## 2021-01-04 DIAGNOSIS — G4733 Obstructive sleep apnea (adult) (pediatric): Secondary | ICD-10-CM | POA: Diagnosis not present

## 2021-01-19 ENCOUNTER — Other Ambulatory Visit (INDEPENDENT_AMBULATORY_CARE_PROVIDER_SITE_OTHER): Payer: Self-pay | Admitting: Physician Assistant

## 2021-01-19 ENCOUNTER — Other Ambulatory Visit: Payer: Self-pay | Admitting: Family Medicine

## 2021-01-19 DIAGNOSIS — E559 Vitamin D deficiency, unspecified: Secondary | ICD-10-CM

## 2021-01-19 NOTE — Telephone Encounter (Signed)
Last seen by Tracey Aguilar, PA-C. 

## 2021-01-20 NOTE — Telephone Encounter (Signed)
She is going to need a visit with labs before I can send this in. Thanks

## 2021-01-21 ENCOUNTER — Telehealth: Payer: Self-pay

## 2021-01-21 ENCOUNTER — Other Ambulatory Visit: Payer: Self-pay

## 2021-01-21 ENCOUNTER — Encounter: Payer: Self-pay | Admitting: Family Medicine

## 2021-01-21 MED ORDER — CLONAZEPAM 0.5 MG PO TABS: ORAL_TABLET | ORAL | 1 refills | Status: DC

## 2021-01-21 NOTE — Telephone Encounter (Signed)
[place] will not allow a telehealth visit with a provider out of that state.

## 2021-01-21 NOTE — Telephone Encounter (Signed)
Patient called in requesting to speak with Dr.Hunter, she states she is currently in [place] because her best friend just passed away. She states she had some medication in the past to help with her depression, and is wondering if something could be sent.

## 2021-01-21 NOTE — Telephone Encounter (Signed)
See below, not sure if virtual can be done for this since she is in [place].

## 2021-01-21 NOTE — Telephone Encounter (Signed)
Please tell patient we need to get her set up with a provider in her area given limitations on virtual visits/care out of state. Please tell her I am very sorry for her loss.

## 2021-01-21 NOTE — Telephone Encounter (Signed)
Angie Velasquez can we do virtual for [place]? May be able to put her on Saturday schedule if so

## 2021-01-21 NOTE — Telephone Encounter (Signed)
Last refill: 06/28/20 #90, 1 Last OV: 12/29/20 dx. 6 month f/u

## 2021-01-25 ENCOUNTER — Telehealth: Payer: Self-pay

## 2021-01-25 DIAGNOSIS — E559 Vitamin D deficiency, unspecified: Secondary | ICD-10-CM

## 2021-01-25 MED ORDER — VITAMIN D (ERGOCALCIFEROL) 1.25 MG (50000 UNIT) PO CAPS: 50000.0000 [IU] | ORAL_CAPSULE | ORAL | 0 refills | Status: DC

## 2021-01-25 NOTE — Telephone Encounter (Signed)
Vit D refilled, Sertraline not on current med list, ok for refill?

## 2021-01-25 NOTE — Telephone Encounter (Signed)
.   LAST APPOINTMENT DATE: 01/21/2021   NEXT APPOINTMENT DATE:@Visit  date not found  MEDICATION:Vitamin D, Ergocalciferol, (DRISDOL) 1.25 MG (50000 UNIT) CAPS capsule  Sertraline (ZOLOFT) 50 mg   [place], [place]       Let patient know to contact pharmacy at the end of the day to make sure medication is ready.  Please notify patient to allow 48-72 hours to process  Encourage patient to contact the pharmacy for refills or they can request refills through [place]:   LAST REFILL:  QTY:  REFILL DATE:    OTHER COMMENTS:    Okay for refill?  Please advise

## 2021-01-25 NOTE — Telephone Encounter (Signed)
See phone note thread from 01/21/21. I had wanted to do a virtual visit but does not appear her state allows it. We really need her to establish there.   If she will agree to find someone then I am willing to prescribe 3 months of zoloft medicine to get her to that visit but please counsel her on the following  Taking the medicine as directed and not missing any doses is one of the best things you can do to treat your depression/stress/anxiety.  Here are some things to keep in mind:  Side effects (stomach upset, some increased anxiety) may happen before you notice a benefit.  These side effects typically go away over time. Changes to your dose of medicine or a change in medication all together is sometimes necessary Most people need to be on medication at least 6-12 months Many people will notice an improvement within two weeks but the full effect of the medication can take up to 4-6 weeks Stopping the medication when you start feeling better often results in a return of symptoms If you start having thoughts of hurting yourself or others after starting this medicine, call our office immediately at [phone] or seek care through 911.

## 2021-01-27 ENCOUNTER — Telehealth: Payer: Self-pay

## 2021-01-27 NOTE — Telephone Encounter (Signed)
Pt is requesting her klonopin be sent to a different pharmacy. Pt states the post office lost her [employer] order. Unsure of which pharmacy she wants it sent to in [place]

## 2021-01-27 NOTE — Telephone Encounter (Signed)
Message sent to pt via mychart

## 2021-01-28 ENCOUNTER — Other Ambulatory Visit: Payer: Self-pay

## 2021-01-28 MED ORDER — CLONAZEPAM 1 MG PO TABS
ORAL_TABLET | ORAL | 0 refills | Status: DC
Start: 2021-01-28 — End: 2021-08-16

## 2021-01-28 NOTE — Telephone Encounter (Signed)
Rx sent to [facility] in [place] [place] per pt request.

## 2021-01-31 ENCOUNTER — Telehealth: Payer: Self-pay

## 2021-01-31 ENCOUNTER — Encounter: Payer: Self-pay | Admitting: Family Medicine

## 2021-01-31 NOTE — Telephone Encounter (Signed)
Sent mychart message to pt clarifying this.

## 2021-01-31 NOTE — Telephone Encounter (Signed)
Pt is requesting a refill for zoloft 50 mg . I do not see medication on current med list, but told pharmacy I would send request

## 2021-02-01 DIAGNOSIS — G4733 Obstructive sleep apnea (adult) (pediatric): Secondary | ICD-10-CM | POA: Diagnosis not present

## 2021-02-01 NOTE — Telephone Encounter (Signed)
Called and spoke with pt, she states [facility] will [facility] and see if they have the script and she will confirm with [name] either by phone or via mychart.

## 2021-02-28 ENCOUNTER — Encounter: Payer: Self-pay | Admitting: Family Medicine

## 2021-03-29 DIAGNOSIS — R059 Cough, unspecified: Secondary | ICD-10-CM | POA: Diagnosis not present

## 2021-03-29 DIAGNOSIS — M25551 Pain in right hip: Secondary | ICD-10-CM | POA: Diagnosis not present

## 2021-03-29 DIAGNOSIS — K219 Gastro-esophageal reflux disease without esophagitis: Secondary | ICD-10-CM | POA: Diagnosis not present

## 2021-03-29 DIAGNOSIS — E785 Hyperlipidemia, unspecified: Secondary | ICD-10-CM | POA: Diagnosis not present

## 2021-03-29 DIAGNOSIS — T7840XA Allergy, unspecified, initial encounter: Secondary | ICD-10-CM | POA: Diagnosis not present

## 2021-03-29 DIAGNOSIS — F419 Anxiety disorder, unspecified: Secondary | ICD-10-CM | POA: Diagnosis not present

## 2021-04-18 ENCOUNTER — Other Ambulatory Visit: Payer: Self-pay | Admitting: Family Medicine

## 2021-04-18 DIAGNOSIS — G4733 Obstructive sleep apnea (adult) (pediatric): Secondary | ICD-10-CM | POA: Diagnosis not present

## 2021-05-17 DIAGNOSIS — R059 Cough, unspecified: Secondary | ICD-10-CM | POA: Diagnosis not present

## 2021-05-18 DIAGNOSIS — R059 Cough, unspecified: Secondary | ICD-10-CM | POA: Diagnosis not present

## 2021-05-18 DIAGNOSIS — E785 Hyperlipidemia, unspecified: Secondary | ICD-10-CM | POA: Diagnosis not present

## 2021-05-18 DIAGNOSIS — F330 Major depressive disorder, recurrent, mild: Secondary | ICD-10-CM | POA: Diagnosis not present

## 2021-05-18 DIAGNOSIS — M109 Gout, unspecified: Secondary | ICD-10-CM | POA: Diagnosis not present

## 2021-05-18 DIAGNOSIS — Z0000 Encounter for general adult medical examination without abnormal findings: Secondary | ICD-10-CM | POA: Diagnosis not present

## 2021-06-07 ENCOUNTER — Other Ambulatory Visit: Payer: Self-pay | Admitting: Family Medicine

## 2021-06-09 DIAGNOSIS — G4733 Obstructive sleep apnea (adult) (pediatric): Secondary | ICD-10-CM | POA: Diagnosis not present

## 2021-06-28 DIAGNOSIS — Z1231 Encounter for screening mammogram for malignant neoplasm of breast: Secondary | ICD-10-CM | POA: Diagnosis not present

## 2021-07-11 DIAGNOSIS — N6001 Solitary cyst of right breast: Secondary | ICD-10-CM | POA: Diagnosis not present

## 2021-08-09 ENCOUNTER — Other Ambulatory Visit: Payer: Self-pay | Admitting: Family Medicine

## 2021-08-15 ENCOUNTER — Other Ambulatory Visit: Payer: Self-pay

## 2021-08-15 NOTE — Telephone Encounter (Signed)
Pt called regarding prescriptions. She has moved out of the state and she doesn't know what to do about her medication. Kathlee Nations stated that she will get a new doctor in October and she [name] unsure about her medication. Please Advise.

## 2021-08-16 MED ORDER — ALLOPURINOL 300 MG PO TABS: 300.0000 mg | ORAL_TABLET | Freq: Two times a day (BID) | ORAL | 3 refills | Status: DC

## 2021-08-16 MED ORDER — METOCLOPRAMIDE HCL 10 MG PO TABS: 10.0000 mg | ORAL_TABLET | Freq: Every day | ORAL | 3 refills | Status: DC | PRN

## 2021-08-16 MED ORDER — CLONAZEPAM 1 MG PO TABS: ORAL_TABLET | ORAL | 0 refills | Status: DC

## 2021-08-16 MED ORDER — LISINOPRIL 2.5 MG PO TABS
2.5000 mg | ORAL_TABLET | Freq: Every day | ORAL | 0 refills | Status: DC
Start: 2021-08-16 — End: 2021-12-22

## 2021-08-16 MED ORDER — GABAPENTIN 300 MG PO CAPS: ORAL_CAPSULE | ORAL | 0 refills | Status: DC

## 2021-08-16 MED ORDER — SIMVASTATIN 20 MG PO TABS: 20.0000 mg | ORAL_TABLET | Freq: Every day | ORAL | 3 refills | Status: DC

## 2021-08-16 MED ORDER — VALACYCLOVIR HCL 1 G PO TABS: ORAL_TABLET | ORAL | 1 refills | Status: AC

## 2021-08-16 MED ORDER — MELOXICAM 15 MG PO TABS: ORAL_TABLET | ORAL | 3 refills | Status: DC

## 2021-08-16 NOTE — Telephone Encounter (Signed)
Pt returned called and she is now using [facility] [place], [place].

## 2021-08-16 NOTE — Telephone Encounter (Signed)
Can we send in refills until Oct since she is now out of state?

## 2021-08-16 NOTE — Addendum Note (Signed)
Addended by: Clyde Lundborg A on: 08/16/2021 09:40 AM   Modules accepted: Orders

## 2021-08-16 NOTE — Telephone Encounter (Signed)
Called and lm for pt tcb please obtain pharmacy that she would like to use when she calls back, mychart message has been sent also.

## 2021-09-01 ENCOUNTER — Other Ambulatory Visit: Payer: Self-pay | Admitting: Family Medicine

## 2021-09-02 ENCOUNTER — Telehealth: Payer: Self-pay

## 2021-09-07 ENCOUNTER — Other Ambulatory Visit: Payer: Self-pay

## 2021-09-07 NOTE — Telephone Encounter (Signed)
LAST APPOINTMENT DATE:  12/29/20  NEXT APPOINTMENT DATE: none  MEDICATION:traMADol (ULTRAM) 50 MG tablet  [place], [place]

## 2021-09-07 NOTE — Telephone Encounter (Signed)
Yes Dr Yong Channel you already signed for this prescription!

## 2021-09-12 ENCOUNTER — Telehealth (INDEPENDENT_AMBULATORY_CARE_PROVIDER_SITE_OTHER): Payer: Medicare HMO | Admitting: Physician Assistant

## 2021-09-12 ENCOUNTER — Encounter: Payer: Self-pay | Admitting: Physician Assistant

## 2021-09-12 ENCOUNTER — Telehealth: Payer: Self-pay | Admitting: Physician Assistant

## 2021-09-12 VITALS — Ht 68.0 in | Wt 250.0 lb

## 2021-09-12 DIAGNOSIS — Z20822 Contact with and (suspected) exposure to covid-19: Secondary | ICD-10-CM

## 2021-09-12 MED ORDER — TRAMADOL HCL 50 MG PO TABS: 50.0000 mg | ORAL_TABLET | Freq: Four times a day (QID) | ORAL | 0 refills | Status: DC | PRN

## 2021-09-12 MED ORDER — MOLNUPIRAVIR EUA 200MG CAPSULE: 4.0000 | ORAL_CAPSULE | Freq: Two times a day (BID) | ORAL | 0 refills | Status: AC

## 2021-09-12 NOTE — Telephone Encounter (Signed)
Jazz- I thought this had gone through as fax before? We sent this in- anyway I tried again electronically if you could tell patient

## 2021-09-12 NOTE — Progress Notes (Signed)
Virtual Visit via Video   I connected with [name] on 09/12/21 at  1:00 PM EDT by a video enabled telemedicine application and verified that I am speaking with the correct person using two identifiers. Location patient: Home Location provider: [facility], Office Persons participating in the virtual visit: Arminta, Gamm PA-C, Anselmo Pickler, LPN   I discussed the limitations of evaluation and management by telemedicine and the availability of in person appointments. The patient expressed understanding and agreed to proceed.  I acted as a Education administrator for [employer], PA-C [employer], LPN   Subjective:   HPI:   Patient is requesting evaluation for possible COVID-19.  Symptom onset: Saturday night  Travel/contacts: Pt was exposed, her son was positive 3 days ago   Vaccination status: Moderna x 2  Testing results: She did a COVID test 2 days ago was Neg.  Patient endorses the following symptoms: Chills, body aches, cough expectorating clear/green sputum, headaches, nausea, diarrhea  She does have what looks like a diagnosis of COPD in her chart, however she states that she recently underwent pulmonary testing in [place] that confirmed she does not have COPD.  Patient denies the following symptoms: Chest pain or SOB, wheezing  Treatments tried: Tylenol q 6 hours, Tramadol for pain, albuterol as needed  Patient risk factors: Current PJASN-05 risk of complications score: 5 Smoking status: Theia Dezeeuw  reports that she quit smoking about 8 years ago. Her smoking use included cigarettes. She has a 35.00 pack-year smoking history. She has never used smokeless tobacco. If female, currently pregnant? []   Yes [x]   No  ROS: See pertinent positives and negatives per HPI.  Patient Active Problem List   Diagnosis Date Noted   Class 2 severe obesity with serious comorbidity and body mass index (BMI) of 39.0  to 39.9 in adult, unspecified obesity type ([place]) 12/29/2020   Snoring 01/06/2020   CKD (chronic kidney disease), stage III ([place]) 02/14/2019   Osteitis condensans 06/25/2018   Osteoarthritis of spine with radiculopathy, cervical region 02/12/2018   Intertrigo 01/10/2018   Pes anserinus bursitis of left knee 01/02/2018   Osteitis pubis ([place]) 12/19/2017   Prediabetes 10/22/2017   Other fatigue 09/18/2017   Shortness of breath on exertion 09/18/2017   Hyperglycemia 09/18/2017   Class 2 obesity due to excess calories without serious comorbidity with body mass index (BMI) of 39.0 to 39.9 in adult 06/13/2017   Atypical chest pain 03/26/2017   Depression 12/19/2016   Essential hypertension 01/04/2016   Eczema 06/29/2015   Gout 03/12/2015   S/p nephrectomy 03/12/2015   Hyperlipidemia 03/12/2015   Tinnitus 03/12/2015   COPD (chronic obstructive pulmonary disease) ([place]) 03/12/2015   History of colonic polyps 03/12/2015   Bimalleolar fracture 07/02/2014   Arthritis    Former smoker    Anxiety state 12/09/2009   Other and unspecified hyperlipidemia 12/09/2009   Vitamin D deficiency 05/28/2009   Benign neoplasm of colon 05/26/2009   Hematuria, unspecified 05/26/2009   Contact dermatitis and other eczema, due to unspecified cause 05/26/2009   Arthropathy 05/26/2009   Insomnia, unspecified 04/27/2009   DDD (degenerative disc disease), lumbosacral 01/20/2009   Hearing loss 01/20/2009   Obesity, unspecified 01/20/2009   Sialoadenitis 01/20/2009   GERD 01/23/2008   MICROALBUMINURIA 01/23/2008   Microscopic hematuria 01/23/2008   UTI (urinary tract infection) 01/08/2008    Social History   Tobacco Use   Smoking status: Former    Packs/day: 1.00    Years:  35.00    Pack years: 35.00    Types: Cigarettes    Quit date: 09/01/2013    Years since quitting: 8.0   Smokeless tobacco: Never   Tobacco comments:    Former smoker with no desire to smoke again. Smoking years edited at patients  request to include periods when she did not smoke.  Substance Use Topics   Alcohol use: Yes    Alcohol/week: 1.0 standard drink    Types: 1 Shots of liquor per week    Current Outpatient Medications:    acetaminophen (TYLENOL) 500 MG tablet, Take 500 mg by mouth every 6 (six) hours as needed., Disp: , Rfl:    albuterol (VENTOLIN HFA) 108 (90 Base) MCG/ACT inhaler, , Disp: , Rfl:    allopurinol (ZYLOPRIM) 300 MG tablet, Take 1 tablet (300 mg total) by mouth 2 (two) times daily., Disp: 180 tablet, Rfl: 3   AMBULATORY NON FORMULARY MEDICATION, Rolling walker with a seat, Disp: 1 Units, Rfl: 0   CALCIUM-MAGNESIUM-ZINC PO, Take by mouth., Disp: , Rfl:    clonazePAM (KLONOPIN) 1 MG tablet, TAKE 0.5- 1 TABLET(0.5 MG- 1 mg) BY MOUTH AT BEDTIME AS NEEDED. Do not drive for at least 8 hours after taking, Disp: 90 tablet, Rfl: 0   diazepam (VALIUM) 10 MG tablet, Take 1 tablet (10 mg total) by mouth every 6 (six) hours as needed (as needed for muscle spasms)., Disp: 30 tablet, Rfl: 0   diclofenac sodium (VOLTAREN) 1 % GEL, Apply topically to affected area qid, Disp: 100 g, Rfl: 2   gabapentin (NEURONTIN) 300 MG capsule, TAKE 1 CAPSULE BY MOUTH UP TO THREE TIMES DAILY OR AS DIRECTED, Disp: 270 capsule, Rfl: 0   lisinopril (ZESTRIL) 2.5 MG tablet, Take 1 tablet (2.5 mg total) by mouth daily., Disp: 90 tablet, Rfl: 0   meloxicam (MOBIC) 15 MG tablet, TAKE 1 TABLET DAILY FOR 14 DAYS, THEN AS NEEDED, Disp: 90 tablet, Rfl: 3   metoCLOPramide (REGLAN) 10 MG tablet, Take 1 tablet (10 mg total) by mouth daily as needed for nausea., Disp: 90 tablet, Rfl: 3   molnupiravir EUA (LAGEVRIO) 200 mg CAPS capsule, Take 4 capsules (800 mg total) by mouth 2 (two) times daily for 5 days., Disp: 40 capsule, Rfl: 0   nystatin cream (MYCOSTATIN), APPLY 1 APPLICATION TOPICALLY TO RASH IN GROIN TWICE DAILY FOR UP TO 2 WEEKS MAXIMUM, Disp: 90 g, Rfl: 2   OVER THE COUNTER MEDICATION, Cherry extract bid, Disp: , Rfl:    OVER THE  COUNTER MEDICATION, B-12 one daily, Disp: , Rfl:    PRILOSEC 20 MG capsule, Take 1 capsule (20 mg total) by mouth daily., Disp: 90 capsule, Rfl: 3   simvastatin (ZOCOR) 20 MG tablet, Take 1 tablet (20 mg total) by mouth at bedtime., Disp: 90 tablet, Rfl: 3   Sodium Bicarbonate POWD, by Does not apply route., Disp: , Rfl:    traMADol (ULTRAM) 50 MG tablet, Take 1 tablet (50 mg total) by mouth every 6 (six) hours as needed for moderate pain or severe pain., Disp: 60 tablet, Rfl: 0   triamcinolone cream (KENALOG) 0.1 %, Apply 1 application topically 2 (two) times daily. For 7-10 days for recurring eczema issues, Disp: 80 g, Rfl: 1   valACYclovir (VALTREX) 1000 MG tablet, Take 2 pills twice a day for 1 day at first sign of cold sore, Disp: 28 tablet, Rfl: 1   Vitamin D, Ergocalciferol, (DRISDOL) 1.25 MG (50000 UNIT) CAPS capsule, Take 1 capsule (50,000 Units  total) by mouth 2 (two) times a week., Disp: 24 capsule, Rfl: 0  Allergies  Allergen Reactions   Epinephrine Palpitations   Codeine Nausea Only   Penicillins Nausea Only   Prednisone Cough    Objective:   VITALS: Per patient if applicable, see vitals. GENERAL: Alert, appears well and in no acute distress. HEENT: Atraumatic, conjunctiva clear, no obvious abnormalities on inspection of external nose and ears. NECK: Normal movements of the head and neck. CARDIOPULMONARY: No increased WOB. Speaking in clear sentences. I:E ratio WNL.  MS: Moves all visible extremities without noticeable abnormality. PSYCH: Pleasant and cooperative, well-groomed. Speech normal rate and rhythm. Affect is appropriate. Insight and judgement are appropriate. Attention is focused, linear, and appropriate.  NEURO: CN grossly intact. Oriented as arrived to appointment on time with no prompting. Moves both UE equally.  SKIN: No obvious lesions, wounds, erythema, or cyanosis noted on face or hands.  Assessment and Plan:   Mozel was seen today for covid  exposure.  Diagnoses and all orders for this visit:  Suspected COVID-19 virus infection  Other orders -     molnupiravir EUA (LAGEVRIO) 200 mg CAPS capsule; Take 4 capsules (800 mg total) by mouth 2 (two) times daily for 5 days.   No red flags on discussion, patient is not in any obvious distress during our visit. Discussed progression of most viral illnesses, and recommended supportive care at this point in time.  We are going to start molnupiravir for her symptoms.  I did discuss that guidelines do suggest starting prednisone for those with COPD or asthma.  She denies chest tightness, wheezing.  She also cannot tolerate prednisone in general due to palpitations.  She wants to avoid that at this time if possible.  She is also asking for refill of her tramadol, I will send a message to her PCP to send this in for patient.  Discussed over the counter supportive care options, including Mucinex, with recommendations to push fluids and rest. Reviewed return precautions including new/worsening fever, SOB, new/worsening cough, sudden onset changes of symptoms. Recommended need to self-quarantine and practice social distancing until symptoms resolve. I recommend that patient follow-up if symptoms worsen or persist despite treatment x 7-10 days, sooner if needed.  I discussed the assessment and treatment plan with the patient. The patient was provided an opportunity to ask questions and all were answered. The patient agreed with the plan and demonstrated an understanding of the instructions.   The patient was advised to call back or seek an in-person evaluation if the symptoms worsen or if the condition fails to improve as anticipated.   CMA or LPN served as scribe during this visit. History, Physical, and Plan performed by medical provider. The above documentation has been reviewed and is accurate and complete.  Inda Coke, [place] 09/12/2021

## 2021-09-12 NOTE — Telephone Encounter (Signed)
Patient was seen by me today virtually for an acute visit and is asking for refill of tramadol.  Will route this request to her PCP.  Inda Coke PA-C

## 2021-09-27 DIAGNOSIS — R109 Unspecified abdominal pain: Secondary | ICD-10-CM | POA: Diagnosis not present

## 2021-09-27 DIAGNOSIS — K760 Fatty (change of) liver, not elsewhere classified: Secondary | ICD-10-CM | POA: Diagnosis not present

## 2021-09-27 DIAGNOSIS — K5790 Diverticulosis of intestine, part unspecified, without perforation or abscess without bleeding: Secondary | ICD-10-CM | POA: Diagnosis not present

## 2021-09-27 DIAGNOSIS — M545 Low back pain, unspecified: Secondary | ICD-10-CM | POA: Diagnosis not present

## 2021-10-17 DIAGNOSIS — M109 Gout, unspecified: Secondary | ICD-10-CM | POA: Diagnosis not present

## 2021-10-17 DIAGNOSIS — G4730 Sleep apnea, unspecified: Secondary | ICD-10-CM | POA: Diagnosis not present

## 2021-10-17 DIAGNOSIS — R69 Illness, unspecified: Secondary | ICD-10-CM | POA: Diagnosis not present

## 2021-10-17 DIAGNOSIS — Z905 Acquired absence of kidney: Secondary | ICD-10-CM | POA: Diagnosis not present

## 2021-10-17 DIAGNOSIS — I10 Essential (primary) hypertension: Secondary | ICD-10-CM | POA: Diagnosis not present

## 2021-10-25 DIAGNOSIS — G4733 Obstructive sleep apnea (adult) (pediatric): Secondary | ICD-10-CM | POA: Diagnosis not present

## 2021-11-24 DIAGNOSIS — G4733 Obstructive sleep apnea (adult) (pediatric): Secondary | ICD-10-CM | POA: Diagnosis not present

## 2021-12-22 ENCOUNTER — Other Ambulatory Visit: Payer: Self-pay | Admitting: Family Medicine

## 2021-12-25 DIAGNOSIS — G4733 Obstructive sleep apnea (adult) (pediatric): Secondary | ICD-10-CM | POA: Diagnosis not present

## 2022-02-04 DIAGNOSIS — R112 Nausea with vomiting, unspecified: Secondary | ICD-10-CM | POA: Diagnosis not present

## 2022-02-04 DIAGNOSIS — K5730 Diverticulosis of large intestine without perforation or abscess without bleeding: Secondary | ICD-10-CM | POA: Diagnosis not present

## 2022-02-04 DIAGNOSIS — E860 Dehydration: Secondary | ICD-10-CM | POA: Diagnosis not present

## 2022-02-04 DIAGNOSIS — K529 Noninfective gastroenteritis and colitis, unspecified: Secondary | ICD-10-CM | POA: Diagnosis not present

## 2022-02-04 DIAGNOSIS — R197 Diarrhea, unspecified: Secondary | ICD-10-CM | POA: Diagnosis not present

## 2022-02-27 ENCOUNTER — Ambulatory Visit (INDEPENDENT_AMBULATORY_CARE_PROVIDER_SITE_OTHER): Payer: Medicare HMO | Admitting: Family Medicine

## 2022-02-27 ENCOUNTER — Encounter: Payer: Self-pay | Admitting: Family Medicine

## 2022-02-27 VITALS — BP 110/70 | HR 92 | Temp 97.2°F | Ht 68.0 in | Wt 266.8 lb

## 2022-02-27 DIAGNOSIS — R110 Nausea: Secondary | ICD-10-CM | POA: Diagnosis not present

## 2022-02-27 DIAGNOSIS — I10 Essential (primary) hypertension: Secondary | ICD-10-CM | POA: Diagnosis not present

## 2022-02-27 DIAGNOSIS — E785 Hyperlipidemia, unspecified: Secondary | ICD-10-CM

## 2022-02-27 MED ORDER — ONDANSETRON 4 MG PO TBDP
4.0000 mg | ORAL_TABLET | Freq: Three times a day (TID) | ORAL | 1 refills | Status: DC | PRN
Start: 2022-02-27 — End: 2022-09-18

## 2022-02-27 MED ORDER — MELOXICAM 15 MG PO TABS: 15.0000 mg | ORAL_TABLET | Freq: Every day | ORAL | 0 refills | Status: DC | PRN

## 2022-02-27 NOTE — Progress Notes (Signed)
? ?Phone [phone] ?In person visit ?  ?Subjective:  ? ?Angie Velasquez is a 76 y.o. year old very pleasant female patient who presents for/with See problem oriented charting ?Chief Complaint  ?Patient presents with  ? Nausea  ?  Pt c/o that started 3 weeks ago after flying to [place]. She had to have CT scan done of her abdomen. However, nausea has continue.  ? ?This visit occurred during the SARS-CoV-2 public health emergency.  Safety protocols were in place, including screening questions prior to the visit, additional usage of staff PPE, and extensive cleaning of exam room while observing appropriate contact time as indicated for disinfecting solutions.  ? ?Past Medical History-  ?Patient Active Problem List  ? Diagnosis Date Noted  ? Depression 12/19/2016  ?  Priority: High  ? S/p nephrectomy 03/12/2015  ?  Priority: High  ? Anxiety state 12/09/2009  ?  Priority: High  ? Essential hypertension 01/04/2016  ?  Priority: Medium   ? Gout 03/12/2015  ?  Priority: Medium   ? Hyperlipidemia 03/12/2015  ?  Priority: Medium   ? COPD (chronic obstructive pulmonary disease) ([place]) 03/12/2015  ?  Priority: Medium   ? Arthritis   ?  Priority: Medium   ? Former smoker   ?  Priority: Medium   ? MICROALBUMINURIA 01/23/2008  ?  Priority: Medium   ? Eczema 06/29/2015  ?  Priority: Low  ? Tinnitus 03/12/2015  ?  Priority: Low  ? History of colonic polyps 03/12/2015  ?  Priority: Low  ? Bimalleolar fracture 07/02/2014  ?  Priority: Low  ? GERD 01/23/2008  ?  Priority: Low  ? Microscopic hematuria 01/23/2008  ?  Priority: Low  ? UTI (urinary tract infection) 01/08/2008  ?  Priority: Low  ? Class 2 severe obesity with serious comorbidity and body mass index (BMI) of 39.0 to 39.9 in adult, unspecified obesity type ([place]) 12/29/2020  ? Snoring 01/06/2020  ? CKD (chronic kidney disease), stage III ([place]) 02/14/2019  ? Osteitis condensans 06/25/2018  ? Osteoarthritis of spine with radiculopathy, cervical region 02/12/2018  ?  Intertrigo 01/10/2018  ? Pes anserinus bursitis of left knee 01/02/2018  ? Osteitis pubis ([place]) 12/19/2017  ? Prediabetes 10/22/2017  ? Other fatigue 09/18/2017  ? Shortness of breath on exertion 09/18/2017  ? Hyperglycemia 09/18/2017  ? Class 2 obesity due to excess calories without serious comorbidity with body mass index (BMI) of 39.0 to 39.9 in adult 06/13/2017  ? Atypical chest pain 03/26/2017  ? Other and unspecified hyperlipidemia 12/09/2009  ? Vitamin D deficiency 05/28/2009  ? Benign neoplasm of colon 05/26/2009  ? Hematuria, unspecified 05/26/2009  ? Contact dermatitis and other eczema, due to unspecified cause 05/26/2009  ? Arthropathy 05/26/2009  ? Insomnia, unspecified 04/27/2009  ? DDD (degenerative disc disease), lumbosacral 01/20/2009  ? Hearing loss 01/20/2009  ? Obesity, unspecified 01/20/2009  ? Sialoadenitis 01/20/2009  ? ? ?Medications- reviewed and updated ?Current Outpatient Medications  ?Medication Sig Dispense Refill  ? acetaminophen (TYLENOL) 500 MG tablet Take 500 mg by mouth every 6 (six) hours as needed.    ? albuterol (VENTOLIN HFA) 108 (90 Base) MCG/ACT inhaler     ? allopurinol (ZYLOPRIM) 300 MG tablet Take 1 tablet (300 mg total) by mouth 2 (two) times daily. 180 tablet 3  ? AMBULATORY NON FORMULARY MEDICATION Rolling walker with a seat 1 Units 0  ? CALCIUM-MAGNESIUM-ZINC PO Take by mouth.    ? clonazePAM (KLONOPIN) 1 MG tablet TAKE 0.5-  1 TABLET(0.5 MG- 1 mg) BY MOUTH AT BEDTIME AS NEEDED. Do not drive for at least 8 hours after taking 90 tablet 0  ? diazepam (VALIUM) 10 MG tablet Take 1 tablet (10 mg total) by mouth every 6 (six) hours as needed (as needed for muscle spasms). 30 tablet 0  ? diclofenac sodium (VOLTAREN) 1 % GEL Apply topically to affected area qid 100 g 2  ? gabapentin (NEURONTIN) 300 MG capsule TAKE 1 CAPSULE BY MOUTH UP TO THREE TIMES DAILY OR AS DIRECTED 270 capsule 0  ? lisinopril (ZESTRIL) 2.5 MG tablet TAKE 1 TABLET BY MOUTH EVERY DAY 90 tablet 0  ? meloxicam  (MOBIC) 15 MG tablet Take 1 tablet (15 mg total) by mouth daily as needed for pain. 6 tablet 0  ? metoCLOPramide (REGLAN) 10 MG tablet Take 1 tablet (10 mg total) by mouth daily as needed for nausea. 90 tablet 3  ? nystatin cream (MYCOSTATIN) APPLY 1 APPLICATION TOPICALLY TO RASH IN GROIN TWICE DAILY FOR UP TO 2 WEEKS MAXIMUM 90 g 2  ? ondansetron (ZOFRAN-ODT) 4 MG disintegrating tablet Take 1 tablet (4 mg total) by mouth every 8 (eight) hours as needed for nausea or vomiting. 20 tablet 1  ? [place] extract bid    ? OVER THE COUNTER MEDICATION B-12 one daily    ? PRILOSEC 20 MG capsule Take 1 capsule (20 mg total) by mouth daily. 90 capsule 3  ? simvastatin (ZOCOR) 20 MG tablet Take 1 tablet (20 mg total) by mouth at bedtime. 90 tablet 3  ? Sodium Bicarbonate POWD by Does not apply route.    ? traMADol (ULTRAM) 50 MG tablet Take 1 tablet (50 mg total) by mouth every 6 (six) hours as needed for moderate pain or severe pain. 60 tablet 0  ? triamcinolone cream (KENALOG) 0.1 % Apply 1 application topically 2 (two) times daily. For 7-10 days for recurring eczema issues 80 g 1  ? valACYclovir (VALTREX) 1000 MG tablet Take 2 pills twice a day for 1 day at first sign of cold sore 28 tablet 1  ? Vitamin D, Ergocalciferol, (DRISDOL) 1.25 MG (50000 UNIT) CAPS capsule Take 1 capsule (50,000 Units total) by mouth 2 (two) times a week. 24 capsule 0  ? ?No current facility-administered medications for this visit.  ? ?  ?Objective:  ?BP 110/70   Pulse 92   Temp (!) 97.2 ?F (36.2 ?C)   Ht '5\' 8"'$  (1.727 m)   Wt 266 lb 12.8 oz (121 kg)   SpO2 97%   BMI 40.57 kg/m?  ?Gen: NAD, resting comfortably ?CV: RRR no murmurs rubs or gallops ?Lungs: CTAB no crackles, wheeze, rhonchi ?Abdomen: soft/nontender/nondistended/normal bowel sounds. No rebound or guarding.  ?Ext: trace edema ?Skin: warm, dry ?  ? ?Assessment and Plan  ? ?#social update- planning to move back to [facility] ? ?# Nausea ?S: Patient has had 3  weeks of nausea.  This started after flying to [facility]- seen in their hospital.  Looking back at their notes she complained of emesis and diarrhea along with generalized abdominal pain and was thought to have acute gastroenteritis.  They noted she had drank some foul tasting sweet tea. Appears to have been given Bentyl, hydrocodone, Zofran.  Reported 12-hour history of over 20 episodes of emesis, many episodes of watery diarrhea without blood or mucus, 10 out of 10 crampy abdominal pain-concern was for bowel obstruction and CT was done.  Appeared mildly dehydrated and white count  was mildly elevated at 12.5 ? ?She reports having a CT scan done of her abdomen.  Through care everywhere able to see the following results from 02/04/2022 for CT abdomen pelvis without contrast  ?"1.  Limited assessment without intravenous contrast.  ?2.  No acute findings in the abdomen or pelvis.  ?3.  Small hiatal hernia. Gastroesophageal reflux.  ?4.  Colonic diverticulosis without acute inflammation.  ?5.  Cholecystectomy.  ?6.  Absent RIGHT kidney." ? ?I see another CT abdomen pelvis without contrast from 09/27/2021 with [facility] which shows "No acute abdominopelvic abnormality on this noncontrast CT.  ?Patchy groundglass airspace opacities at the lung bases are noted, most likely infectious or inflammatory. Covid can have this appearance." ? ?Today she reports:  ?-ongoing nausea but can tolerate fluids and some foods- overall improving ?no more vomiting ?- abdominal pain improved ?- watery diarrhea stopped perhaps Sunday afterwards ?-taking omeprazole 20 mg and stays hydrated and uses baking soda- all seem to help and tries not to eat too late ?- tried reglan previously but has not tolerated it. Went through 5 doses of zofran.  ?A/P: improving but not at 100% after gastroenteritis ?-trial ondansetron for nausea ?- can also do [place] or another probiotic like florastor or align ?-continue omeprazole ? ?#Hyperlipidemia ?S:  Compliant with simvastatin 20 mg ?Lab Results  ?Component Value Date  ? CHOL 212 (H) 05/04/2020  ? HDL 63 05/04/2020  ? LDLCALC 112 (H) 05/04/2020  ? LDLDIRECT 77.0 01/18/2017  ? TRIG 215 (H) 05/04/2020  ? CHOLHD

## 2022-02-27 NOTE — Patient Instructions (Addendum)
improving but not at 100% after gastroenteritis/stomach bug ?-trial ondansetron for nausea ?- can also do [place] or another probiotic like florastor or align ?-continue omeprazole ? ?Plan on blood work next visit ? ?Once you get recovered and moved in- Please check with your pharmacy to see if they have the shingrix vaccine. If they do- please get this immunization and update [name] by phone call or mychart with dates you receive the vaccine ? ?Recommended follow up: Return in about 3 months (around 05/30/2022) for followup or sooner if needed.Schedule b4 you leave. Or whenever you get back down and settled in ?

## 2022-03-07 ENCOUNTER — Other Ambulatory Visit: Payer: Self-pay | Admitting: Family Medicine

## 2022-03-07 MED ORDER — ALLOPURINOL 300 MG PO TABS: 300.0000 mg | ORAL_TABLET | Freq: Two times a day (BID) | ORAL | 3 refills | Status: DC

## 2022-03-07 MED ORDER — TRAMADOL HCL 50 MG PO TABS: 50.0000 mg | ORAL_TABLET | Freq: Four times a day (QID) | ORAL | 0 refills | Status: DC | PRN

## 2022-03-07 MED ORDER — GABAPENTIN 300 MG PO CAPS: ORAL_CAPSULE | ORAL | 3 refills | Status: DC

## 2022-03-07 NOTE — Telephone Encounter (Signed)
Refills sent to pharmacy, Tramadol sent to Dr. Yong Channel for approval.  ?

## 2022-03-07 NOTE — Telephone Encounter (Signed)
.. ?  Encourage patient to contact the pharmacy for refills or they can request refills through [facility] ? ?LAST APPOINTMENT DATE:  02/27/22 ? ?NEXT APPOINTMENT DATE: 05/30/22 ? ?MEDICATION:allopurinol (ZYLOPRIM) 300 MG tablet ? ?gabapentin (NEURONTIN) 300 MG capsule ? ?traMADol (ULTRAM) 50 MG tablet ? ? ?Is the patient out of medication? ? ?[id number] [place]  [phone] ?Fax:  [phone] ?  ? ? ?Let patient know to contact pharmacy at the end of the day to make sure medication is ready. ? ?Please notify patient to allow 48-72 hours to process  ?

## 2022-03-13 ENCOUNTER — Encounter: Payer: Self-pay | Admitting: Family Medicine

## 2022-03-13 ENCOUNTER — Other Ambulatory Visit: Payer: Self-pay

## 2022-03-16 ENCOUNTER — Other Ambulatory Visit: Payer: Self-pay | Admitting: Family Medicine

## 2022-03-20 ENCOUNTER — Other Ambulatory Visit: Payer: Self-pay

## 2022-03-20 MED ORDER — SERTRALINE HCL 50 MG PO TABS: 50.0000 mg | ORAL_TABLET | Freq: Every day | ORAL | 1 refills | Status: DC

## 2022-05-12 ENCOUNTER — Telehealth: Payer: Self-pay | Admitting: Family Medicine

## 2022-05-12 DIAGNOSIS — E785 Hyperlipidemia, unspecified: Secondary | ICD-10-CM

## 2022-05-12 NOTE — Telephone Encounter (Signed)
I didn't see any labs mentioned in the last ov note and no recent labs in lab hx for our office.

## 2022-05-12 NOTE — Telephone Encounter (Signed)
Patient states she was advised by Dr. Yong Channel to complete labs.  I do not see any lab orders.  Please follow up with patient in regard.

## 2022-05-12 NOTE — Telephone Encounter (Signed)
From last note "will need to update lipid panel- she wants to schedule follo up in AM and do at that time"  She has visit at 9 am- I thought she just wanted to do labs then- does she prefer to do ahead of visit?

## 2022-05-15 NOTE — Telephone Encounter (Signed)
Left patient vm to call back to schedule lab appt.

## 2022-05-15 NOTE — Telephone Encounter (Signed)
Ok to schedule pt for separate lab visit if she does not want them done at the time of her appointment on 06/27. Orders are in.

## 2022-05-17 ENCOUNTER — Other Ambulatory Visit (INDEPENDENT_AMBULATORY_CARE_PROVIDER_SITE_OTHER): Payer: Medicare HMO

## 2022-05-17 DIAGNOSIS — E785 Hyperlipidemia, unspecified: Secondary | ICD-10-CM

## 2022-05-17 LAB — LIPID PANEL
Cholesterol: 190 mg/dL (ref 0–200)
HDL: 58.2 mg/dL (ref >39.00)
NonHDL: 132.2
Total CHOL/HDL Ratio: 3
Triglycerides: 251 mg/dL — ABNORMAL HIGH (ref 0.0–149.0)
VLDL: 50.2 mg/dL — ABNORMAL HIGH (ref 0.0–40.0)

## 2022-05-17 LAB — LDL CHOLESTEROL, DIRECT: Direct LDL: 101 mg/dL

## 2022-05-26 ENCOUNTER — Other Ambulatory Visit: Payer: Self-pay | Admitting: Family Medicine

## 2022-05-29 MED ORDER — CLONAZEPAM 1 MG PO TABS
ORAL_TABLET | ORAL | 0 refills | Status: DC
Start: 2022-05-29 — End: 2022-06-05

## 2022-05-30 ENCOUNTER — Encounter: Payer: Self-pay | Admitting: Family Medicine

## 2022-05-30 ENCOUNTER — Ambulatory Visit (INDEPENDENT_AMBULATORY_CARE_PROVIDER_SITE_OTHER): Payer: Medicare HMO | Admitting: Family Medicine

## 2022-05-30 VITALS — BP 120/62 | HR 77 | Temp 97.7°F | Ht 68.0 in | Wt 269.2 lb

## 2022-05-30 DIAGNOSIS — J449 Chronic obstructive pulmonary disease, unspecified: Secondary | ICD-10-CM

## 2022-05-30 DIAGNOSIS — R809 Proteinuria, unspecified: Secondary | ICD-10-CM

## 2022-05-30 DIAGNOSIS — I10 Essential (primary) hypertension: Secondary | ICD-10-CM | POA: Diagnosis not present

## 2022-05-30 DIAGNOSIS — M1A9XX0 Chronic gout, unspecified, without tophus (tophi): Secondary | ICD-10-CM

## 2022-05-30 DIAGNOSIS — F3342 Major depressive disorder, recurrent, in full remission: Secondary | ICD-10-CM | POA: Diagnosis not present

## 2022-05-30 DIAGNOSIS — Z1231 Encounter for screening mammogram for malignant neoplasm of breast: Secondary | ICD-10-CM

## 2022-05-30 DIAGNOSIS — R109 Unspecified abdominal pain: Secondary | ICD-10-CM

## 2022-05-30 DIAGNOSIS — E785 Hyperlipidemia, unspecified: Secondary | ICD-10-CM | POA: Diagnosis not present

## 2022-05-30 DIAGNOSIS — R69 Illness, unspecified: Secondary | ICD-10-CM | POA: Diagnosis not present

## 2022-05-30 DIAGNOSIS — Z1211 Encounter for screening for malignant neoplasm of colon: Secondary | ICD-10-CM

## 2022-05-30 LAB — MICROALBUMIN / CREATININE URINE RATIO
Creatinine,U: 45.4 mg/dL
Microalb Creat Ratio: 163.8 mg/g — ABNORMAL HIGH (ref 0.0–30.0)
Microalb, Ur: 74.3 mg/dL — ABNORMAL HIGH (ref 0.0–1.9)

## 2022-05-30 NOTE — Progress Notes (Signed)
Phone [phone] In person visit   Subjective:   Angie Velasquez is a 76 y.o. year old very pleasant female patient who presents for/with See problem oriented charting Chief Complaint  Patient presents with   Follow-up   Hyperlipidemia   Gout   Depression   Hernia    Pt states hernia is bothering her.    Past Medical History-  Patient Active Problem List   Diagnosis Date Noted   Depression 12/19/2016    Priority: High   S/p nephrectomy 03/12/2015    Priority: High   Anxiety state 12/09/2009    Priority: High   CKD (chronic kidney disease), stage III (HCC) 02/14/2019    Priority: Medium    Prediabetes 10/22/2017    Priority: Medium    Hyperglycemia 09/18/2017    Priority: Medium    Essential hypertension 01/04/2016    Priority: Medium    Gout 03/12/2015    Priority: Medium    Hyperlipidemia 03/12/2015    Priority: Medium    COPD (chronic obstructive pulmonary disease) (HCC) 03/12/2015    Priority: Medium    Arthritis     Priority: Medium    Former smoker     Priority: Medium    Vitamin D deficiency 05/28/2009    Priority: Medium    MICROALBUMINURIA 01/23/2008    Priority: Medium    Osteitis pubis (HCC) 12/19/2017    Priority: Low   Eczema 06/29/2015    Priority: Low   Tinnitus 03/12/2015    Priority: Low   History of colonic polyps 03/12/2015    Priority: Low   Bimalleolar fracture 07/02/2014    Priority: Low   DDD (degenerative disc disease), lumbosacral 01/20/2009    Priority: Low   Sialoadenitis 01/20/2009    Priority: Low   GERD 01/23/2008    Priority: Low   Microscopic hematuria 01/23/2008    Priority: Low   UTI (urinary tract infection) 01/08/2008    Priority: Low   Snoring 01/06/2020   Osteitis condensans 06/25/2018   Osteoarthritis of spine with radiculopathy, cervical region 02/12/2018   Intertrigo 01/10/2018   Pes anserinus bursitis of left knee 01/02/2018   Other fatigue 09/18/2017   Shortness of breath on exertion  09/18/2017   Atypical chest pain 03/26/2017   Benign neoplasm of colon 05/26/2009   Hematuria, unspecified 05/26/2009   Contact dermatitis and other eczema, due to unspecified cause 05/26/2009   Arthropathy 05/26/2009   Insomnia, unspecified 04/27/2009   Hearing loss 01/20/2009    Medications- reviewed and updated Current Outpatient Medications  Medication Sig Dispense Refill   acetaminophen (TYLENOL) 500 MG tablet Take 500 mg by mouth every 6 (six) hours as needed.     albuterol (VENTOLIN HFA) 108 (90 Base) MCG/ACT inhaler      allopurinol (ZYLOPRIM) 300 MG tablet Take 1 tablet (300 mg total) by mouth 2 (two) times daily. 180 tablet 3   AMBULATORY NON FORMULARY MEDICATION Rolling walker with a seat 1 Units 0   CALCIUM-MAGNESIUM-ZINC PO Take by mouth.     clonazePAM (KLONOPIN) 1 MG tablet TAKE 0.5- 1 TABLET(0.5 MG- 1 mg) BY MOUTH AT BEDTIME AS NEEDED. Do not drive for at least 8 hours after taking 90 tablet 0   diazepam (VALIUM) 10 MG tablet Take 1 tablet (10 mg total) by mouth every 6 (six) hours as needed (as needed for muscle spasms). 30 tablet 0   diclofenac sodium (VOLTAREN) 1 % GEL Apply topically to affected area qid 100 g 2   gabapentin (NEURONTIN)  300 MG capsule TAKE 1 CAPSULE BY MOUTH UP TO THREE TIMES DAILY OR AS DIRECTED 270 capsule 3   lisinopril (ZESTRIL) 2.5 MG tablet TAKE 1 TABLET BY MOUTH EVERY DAY 90 tablet 0   meloxicam (MOBIC) 15 MG tablet Take 1 tablet (15 mg total) by mouth daily as needed for pain. 6 tablet 0   metoCLOPramide (REGLAN) 10 MG tablet Take 1 tablet (10 mg total) by mouth daily as needed for nausea. 90 tablet 3   nystatin cream (MYCOSTATIN) APPLY 1 APPLICATION TOPICALLY TO RASH IN GROIN TWICE DAILY FOR UP TO 2 WEEKS MAXIMUM 90 g 2   ondansetron (ZOFRAN-ODT) 4 MG disintegrating tablet Take 1 tablet (4 mg total) by mouth every 8 (eight) hours as needed for nausea or vomiting. 20 tablet 1   OVER THE COUNTER MEDICATION Cherry extract bid     OVER THE COUNTER  MEDICATION B-12 one daily     PRILOSEC 20 MG capsule Take 1 capsule (20 mg total) by mouth daily. 90 capsule 3   sertraline (ZOLOFT) 50 MG tablet Take 1 tablet (50 mg total) by mouth daily. 90 tablet 1   simvastatin (ZOCOR) 20 MG tablet Take 1 tablet (20 mg total) by mouth at bedtime. 90 tablet 3   Sodium Bicarbonate POWD by Does not apply route.     traMADol (ULTRAM) 50 MG tablet Take 1 tablet (50 mg total) by mouth every 6 (six) hours as needed for moderate pain or severe pain. 60 tablet 0   triamcinolone cream (KENALOG) 0.1 % Apply 1 application topically 2 (two) times daily. For 7-10 days for recurring eczema issues 80 g 1   valACYclovir (VALTREX) 1000 MG tablet Take 2 pills twice a day for 1 day at first sign of cold sore 28 tablet 1   Vitamin D, Ergocalciferol, (DRISDOL) 1.25 MG (50000 UNIT) CAPS capsule Take 1 capsule (50,000 Units total) by mouth 2 (two) times a week. 24 capsule 0   No current facility-administered medications for this visit.     Objective:  BP 120/62   Pulse 77   Temp 97.7 F (36.5 C)   Ht 5\' 8"  (1.727 m)   Wt 269 lb 3.2 oz (122.1 kg)   SpO2 95%   BMI 40.93 kg/m  Gen: NAD, resting comfortably CV: RRR no murmurs rubs or gallops Lungs: CTAB no crackles, wheeze, rhonchi Ext: trace nonpitting edema Skin: warm, dry    Assessment and Plan   #Hernia? S:has noted along right side of abdomen. Gets pain intermittently- notes bulge  A/P: suspect hernia- wants ultrasound before seeing surgery- ordered today- open to considering surgery  - able to climb 2 flights of stairs without chest pain or shortness of breath- would not need further cardiac clearance   #Hyperlipidemia S: Compliant with simvastatin 20 mg Lab Results  Component Value Date   CHOL 190 05/17/2022   HDL 58.20 05/17/2022   LDLCALC 112 (H) 05/04/2020   LDLDIRECT 101.0 05/17/2022   TRIG 251.0 (H) 05/17/2022   CHOLHDL 3 05/17/2022   A/P: she was sent some excess simvastatin- is going ot try 40  mg- and if does well with this we can send in 40mg  dose but has at least a few months of supply with 20mg  x2    #Hypertension/microalbuminuria/ unilateral kidney S: Compliant with lisinopril 2.5 mg-though primarily on for renal protective effects with history of microalbuminuria A/P: Controlled HTN. Continue current medications.   - check micoralbumin/creatinine ratio- could increase lisinopril if needed   #Depression-full  remission/anxiety S: medication: sertraline 50 mg -On clonazepam as needed only- able to cut in half Previously-Wellbutrin 200 mg sustained-release to help with weight loss, was s also on low-dose Zoloft 50 mg but weaned off in 2021  Due to anxiety/stress-uses clonazepam-uses pretty regularly for sleep.  Uses very sparing diazepam for spasms in her neck - has been a long time    05/30/2022    8:57 AM 02/27/2022    1:35 PM 12/29/2020   11:06 AM  Depression screen PHQ 2/9  Decreased Interest 1 0 0  Down, Depressed, Hopeless 1 1 0  PHQ - 2 Score 2 1 0  Altered sleeping 0 0 0  Tired, decreased energy 0 0 0  Change in appetite 0 0 0  Feeling bad or failure about yourself  0 0 0  Trouble concentrating 0 0 0  Moving slowly or fidgety/restless 0 0 0  Suicidal thoughts 0 0 0  PHQ-9 Score 2 1 0  Difficult doing work/chores Not difficult at all Not difficult at all Not difficult at all  A/P: depression in full remission- continue current meds -not needing diazepam - clonazepam half tablet helpful fo rsleep   #Gout S: 0 flares in years on allopurinol 300 mg twice daily A/P:Controlled. Continue current medications.    # COPD  S: radiographic finding only Maintenance medications: None A/P: asymptomatic- continue to monitor  # Hyperglycemia/insulin resistance/prediabetes-A1c 5.9 in 2021 #Morbid obesity with BMI over 40 S:  Medication: none Exercise and diet- plans to go back to weight loss clinic  A/P: update a1c next visit- and wants to get plugged back into healthy  weight to wellness  #HM- refer for mammogram and colonoscopy- overdu  Recommended follow up: Return in about 6 months (around 11/29/2022) for physical or sooner if needed.Schedule b4 you leave.  Lab/Order associations:   ICD-10-CM   1. Essential hypertension  I10     2. Hyperlipidemia, unspecified hyperlipidemia type  E78.5     3. Chronic gout without tophus, unspecified cause, unspecified site  M1A.9XX0     4. Recurrent major depressive disorder, in full remission (HCC)  F33.42     5. Chronic obstructive pulmonary disease, unspecified COPD type (HCC) Chronic J44.9     6. Morbid obesity (HCC)  E66.01 Amb Ref to Medical Weight Management    7. Proteinuria, unspecified type  R80.9 Microalbumin / creatinine urine ratio    8. Screen for colon cancer  Z12.11 Ambulatory referral to Gastroenterology    9. Encounter for screening mammogram for malignant neoplasm of breast  Z12.31 MM Digital Diagnostic Bilat     No orders of the defined types were placed in this encounter.  Return precautions advised.  Tana Conch, MD

## 2022-06-02 ENCOUNTER — Ambulatory Visit (INDEPENDENT_AMBULATORY_CARE_PROVIDER_SITE_OTHER): Payer: Medicare HMO

## 2022-06-02 ENCOUNTER — Encounter: Payer: Self-pay | Admitting: Family Medicine

## 2022-06-02 DIAGNOSIS — Z0000 Encounter for general adult medical examination without abnormal findings: Secondary | ICD-10-CM

## 2022-06-02 DIAGNOSIS — Z1231 Encounter for screening mammogram for malignant neoplasm of breast: Secondary | ICD-10-CM | POA: Diagnosis not present

## 2022-06-02 NOTE — Patient Instructions (Addendum)
Angie Velasquez , Thank you for taking time to come for your Medicare Wellness Visit. I appreciate your ongoing commitment to your health goals. Please review the following plan we discussed and let me know if I can assist you in the future.   Screening recommendations/referrals: Colonoscopy: completed 05/12/15 repeat every 5 years due  Mammogram: order placed 06/02/22 Bone Density: done 09/11/17  Recommended yearly ophthalmology/optometry visit for glaucoma screening and checkup Recommended yearly dental visit for hygiene and checkup  Vaccinations: Influenza vaccine: Declined  Pneumococcal vaccine: Up to date Tdap vaccine: Done 04/24/16 repeat every 10 years  Shingles vaccine: Shingrix discussed. Please contact your pharmacy for coverage information.    Covid-19:Completed 3/13, 03/07/20  Advanced directives: Please bring a copy of your health care power of attorney and living will to the office at your convenience.  Conditions/risks identified: lose weight   Next appointment: Follow up in one year for your annual wellness visit    Preventive Care 65 Years and Older, Female Preventive care refers to lifestyle choices and visits with your health care provider that can promote health and wellness. What does preventive care include? A yearly physical exam. This is also called an annual well check. Dental exams once or twice a year. Routine eye exams. Ask your health care provider how often you should have your eyes checked. Personal lifestyle choices, including: Daily care of your teeth and gums. Regular physical activity. Eating a healthy diet. Avoiding tobacco and drug use. Limiting alcohol use. Practicing safe sex. Taking low-dose aspirin every day. Taking vitamin and mineral supplements as recommended by your health care provider. What happens during an annual well check? The services and screenings done by your health care provider during your annual well check will depend on your  age, overall health, lifestyle risk factors, and family history of disease. Counseling  Your health care provider may ask you questions about your: Alcohol use. Tobacco use. Drug use. Emotional well-being. Home and relationship well-being. Sexual activity. Eating habits. History of falls. Memory and ability to understand (cognition). Work and work Statistician. Reproductive health. Screening  You may have the following tests or measurements: Height, weight, and BMI. Blood pressure. Lipid and cholesterol levels. These may be checked every 5 years, or more frequently if you are over 29 years old. Skin check. Lung cancer screening. You may have this screening every year starting at age 74 if you have a 30-pack-year history of smoking and currently smoke or have quit within the past 15 years. Fecal occult blood test (FOBT) of the stool. You may have this test every year starting at age 49. Flexible sigmoidoscopy or colonoscopy. You may have a sigmoidoscopy every 5 years or a colonoscopy every 10 years starting at age 34. Hepatitis C blood test. Hepatitis B blood test. Sexually transmitted disease (STD) testing. Diabetes screening. This is done by checking your blood sugar (glucose) after you have not eaten for a while (fasting). You may have this done every 1-3 years. Bone density scan. This is done to screen for osteoporosis. You may have this done starting at age 66. Mammogram. This may be done every 1-2 years. Talk to your health care provider about how often you should have regular mammograms. Talk with your health care provider about your test results, treatment options, and if necessary, the need for more tests. Vaccines  Your health care provider may recommend certain vaccines, such as: Influenza vaccine. This is recommended every year. Tetanus, diphtheria, and acellular pertussis (Tdap, Td) vaccine. You  may need a Td booster every 10 years. Zoster vaccine. You may need this after  age 2. Pneumococcal 13-valent conjugate (PCV13) vaccine. One dose is recommended after age 51. Pneumococcal polysaccharide (PPSV23) vaccine. One dose is recommended after age 16. Talk to your health care provider about which screenings and vaccines you need and how often you need them. This information is not intended to replace advice given to you by your health care provider. Make sure you discuss any questions you have with your health care provider. Document Released: 12/17/2015 Document Revised: 08/09/2016 Document Reviewed: 09/21/2015 Elsevier Interactive Patient Education  2017 [employer] Prevention in the Home Falls can cause injuries. They can happen to people of all ages. There are many things you can do to make your home safe and to help prevent falls. What can I do on the outside of my home? Regularly fix the edges of walkways and driveways and fix any cracks. Remove anything that might make you trip as you walk through a door, such as a raised step or threshold. Trim any bushes or trees on the path to your home. Use bright outdoor lighting. Clear any walking paths of anything that might make someone trip, such as rocks or tools. Regularly check to see if handrails are loose or broken. Make sure that both sides of any steps have handrails. Any raised decks and porches should have guardrails on the edges. Have any leaves, snow, or ice cleared regularly. Use sand or salt on walking paths during winter. Clean up any spills in your garage right away. This includes oil or grease spills. What can I do in the bathroom? Use night lights. Install grab bars by the toilet and in the tub and shower. Do not use towel bars as grab bars. Use non-skid mats or decals in the tub or shower. If you need to sit down in the shower, use a plastic, non-slip stool. Keep the floor dry. Clean up any water that spills on the floor as soon as it happens. Remove soap buildup in the tub or shower  regularly. Attach bath mats securely with double-sided non-slip rug tape. Do not have throw rugs and other things on the floor that can make you trip. What can I do in the bedroom? Use night lights. Make sure that you have a light by your bed that is easy to reach. Do not use any sheets or blankets that are too big for your bed. They should not hang down onto the floor. Have a firm chair that has side arms. You can use this for support while you get dressed. Do not have throw rugs and other things on the floor that can make you trip. What can I do in the kitchen? Clean up any spills right away. Avoid walking on wet floors. Keep items that you use a lot in easy-to-reach places. If you need to reach something above you, use a strong step stool that has a grab bar. Keep electrical cords out of the way. Do not use floor polish or wax that makes floors slippery. If you must use wax, use non-skid floor wax. Do not have throw rugs and other things on the floor that can make you trip. What can I do with my stairs? Do not leave any items on the stairs. Make sure that there are handrails on both sides of the stairs and use them. Fix handrails that are broken or loose. Make sure that handrails are as long as the  stairways. Check any carpeting to make sure that it is firmly attached to the stairs. Fix any carpet that is loose or worn. Avoid having throw rugs at the top or bottom of the stairs. If you do have throw rugs, attach them to the floor with carpet tape. Make sure that you have a light switch at the top of the stairs and the bottom of the stairs. If you do not have them, ask someone to add them for you. What else can I do to help prevent falls? Wear shoes that: Do not have high heels. Have rubber bottoms. Are comfortable and fit you well. Are closed at the toe. Do not wear sandals. If you use a stepladder: Make sure that it is fully opened. Do not climb a closed stepladder. Make sure that  both sides of the stepladder are locked into place. Ask someone to hold it for you, if possible. Clearly mark and make sure that you can see: Any grab bars or handrails. First and last steps. Where the edge of each step is. Use tools that help you move around (mobility aids) if they are needed. These include: Canes. Walkers. Scooters. Crutches. Turn on the lights when you go into a dark area. Replace any light bulbs as soon as they burn out. Set up your furniture so you have a clear path. Avoid moving your furniture around. If any of your floors are uneven, fix them. If there are any pets around you, be aware of where they are. Review your medicines with your doctor. Some medicines can make you feel dizzy. This can increase your chance of falling. Ask your doctor what other things that you can do to help prevent falls. This information is not intended to replace advice given to you by your health care provider. Make sure you discuss any questions you have with your health care provider. Document Released: 09/16/2009 Document Revised: 04/27/2016 Document Reviewed: 12/25/2014 Elsevier Interactive Patient Education  2017 [employer].

## 2022-06-02 NOTE — Progress Notes (Addendum)
Virtual Visit via Telephone Note  I connected with  Angie Velasquez on 06/02/22 at  1:00 PM EDT by telephone and verified that I am speaking with the correct person using two identifiers.  Medicare Annual Wellness visit completed telephonically due to Covid-19 pandemic.   Persons participating in this call: This Health Coach and this patient.   Location: Patient: home Provider: office    I discussed the limitations, risks, security and privacy concerns of performing an evaluation and management service by telephone and the availability of in person appointments. The patient expressed understanding and agreed to proceed.  Unable to perform video visit due to video visit attempted and failed and/or patient does not have video capability.   Some vital signs may be absent or patient reported.   Angie Brace, LPN   Subjective:   Angie Velasquez is a 76 y.o. female who presents for an Initial Medicare Annual Wellness Visit.  Review of Systems     Cardiac Risk Factors include: advanced age (>42mn, >>78women);dyslipidemia;hypertension;obesity (BMI >30kg/m2)     Objective:    There were no vitals filed for this visit. There is no height or weight on file to calculate BMI.     06/02/2022    1:08 PM 01/03/2018    1:06 PM 06/16/2017    3:09 PM 04/28/2015    9:00 AM  Advanced Directives  Does Patient Have a Medical Advance Directive? Yes Yes Yes Yes  Type of AParamedicof [place]ir[place]Living will [place] will  Does patient want to make changes to medical advance directive?  No - Patient declined  No - Patient declined  Copy of [place] Chart? No - copy requested  No - copy requested No - copy requested    Current Medications (verified) Outpatient Encounter Medications as of 06/02/2022  Medication Sig   albuterol (VENTOLIN HFA) 108 (90 Base) MCG/ACT  inhaler    allopurinol (ZYLOPRIM) 300 MG tablet Take 1 tablet (300 mg total) by mouth 2 (two) times daily.   CALCIUM-MAGNESIUM-ZINC PO Take by mouth.   clonazePAM (KLONOPIN) 1 MG tablet TAKE 0.5- 1 TABLET(0.5 MG- 1 mg) BY MOUTH AT BEDTIME AS NEEDED. Do not drive for at least 8 hours after taking   CRANBERRY PO Take by mouth.   diazepam (VALIUM) 10 MG tablet Take 1 tablet (10 mg total) by mouth every 6 (six) hours as needed (as needed for muscle spasms).   diclofenac sodium (VOLTAREN) 1 % GEL Apply topically to affected area qid   Ginger, Zingiber officinalis, (GINGER ROOT) 550 MG CAPS Take by mouth.   lisinopril (ZESTRIL) 2.5 MG tablet TAKE 1 TABLET BY MOUTH EVERY DAY   meloxicam (MOBIC) 15 MG tablet Take 1 tablet (15 mg total) by mouth daily as needed for pain.   metoCLOPramide (REGLAN) 10 MG tablet Take 1 tablet (10 mg total) by mouth daily as needed for nausea.   nystatin cream (MYCOSTATIN) APPLY 1 APPLICATION TOPICALLY TO RASH IN GROIN TWICE DAILY FOR UP TO 2 WEEKS MAXIMUM   ondansetron (ZOFRAN-ODT) 4 MG disintegrating tablet Take 1 tablet (4 mg total) by mouth every 8 (eight) hours as needed for nausea or vomiting.   OVER THE COUNTER MEDICATION Cherry extract bid   OVER THE COUNTER MEDICATION B-12 one daily   PRILOSEC 20 MG capsule Take 1 capsule (20 mg total) by mouth daily.   sertraline (ZOLOFT) 50 MG tablet Take 1 tablet (  50 mg total) by mouth daily.   simvastatin (ZOCOR) 20 MG tablet Take 1 tablet (20 mg total) by mouth at bedtime.   Sodium Bicarbonate POWD by Does not apply route.   traMADol (ULTRAM) 50 MG tablet Take 1 tablet (50 mg total) by mouth every 6 (six) hours as needed for moderate pain or severe pain.   triamcinolone cream (KENALOG) 0.1 % Apply 1 application topically 2 (two) times daily. For 7-10 days for recurring eczema issues   TURMERIC PO Take by mouth.   valACYclovir (VALTREX) 1000 MG tablet Take 2 pills twice a day for 1 day at first sign of cold sore   Vitamin D,  Ergocalciferol, (DRISDOL) 1.25 MG (50000 UNIT) CAPS capsule Take 1 capsule (50,000 Units total) by mouth 2 (two) times a week.   gabapentin (NEURONTIN) 300 MG capsule TAKE 1 CAPSULE BY MOUTH UP TO THREE TIMES DAILY OR AS DIRECTED (Patient not taking: Reported on 06/02/2022)   No facility-administered encounter medications on file as of 06/02/2022.    Allergies (verified) Epinephrine, Codeine, Penicillins, and Prednisone   History: Past Medical History:  Diagnosis Date   Anxiety    years ago - no longer an issue   Arthritis    gout   Cataracts, bilateral    Complication of anesthesia    Fracture of pelvis (HCC)    GERD (gastroesophageal reflux disease)    Hx of chest pain    "anxiety"   Hyperlipidemia    INSOMNIA 10/14/2008   in past   Joint pain    Kidney problem    Low blood sugar    eats several smaller meals a day   Obesity    Palpitations    Stress related    Proteinuria    H/O   Tobacco abuse    quit smoking 08/2013   Past Surgical History:  Procedure Laterality Date   ABDOMINAL HYSTERECTOMY     APPENDECTOMY     with hysterectomy   childbirth     x4   CHOLECYSTECTOMY  2001   EYE SURGERY Bilateral 2015   cataract surgery with lens implant   NEPHRECTOMY  1992   complex cystic mass - nephrectomy right   ORIF ANKLE FRACTURE Right 07/02/2014   Procedure: OPEN REDUCTION INTERNAL FIXATION (ORIF) RIGHT ANKLE BIMALLOELAR FRACTURE;  Surgeon: Wylene Simmer, MD;  Location: [facility];  Service: Orthopedics;  Laterality: Right;   OTHER SURGICAL HISTORY     hysterectomy, R ovary remains   SHOULDER SURGERY     bone spurs left   Family History  Problem Relation Age of Onset   Stroke Father    Alzheimer's disease Father        early, states also parkinsons. died 25   Hypertension Father    Obesity Father    Ovarian cancer Mother        Dr. Jana Hakim    Arthritis Mother    Obesity Mother    Colon cancer Paternal Grandmother    Liver disease Daughter        On hospice age  50   Social History   Socioeconomic History   Marital status: Divorced    Spouse name: Angie Velasquez   Number of children: 4   Years of education: Not on file   Highest education level: Not on file  Occupational History   Occupation: retired  Tobacco Use   Smoking status: Former    Packs/day: 1.00    Years: 35.00    Total pack years:  35.00    Types: Cigarettes    Quit date: 09/01/2013    Years since quitting: 8.7   Smokeless tobacco: Never   Tobacco comments:    Former smoker with no desire to smoke again. Smoking years edited at patients request to include periods when she did not smoke.  Substance and Sexual Activity   Alcohol use: Yes    Alcohol/week: 1.0 standard drink of alcohol    Types: 1 Shots of liquor per week   Drug use: No   Sexual activity: Not on file  Other Topics Concern   Not on file  Social History Narrative   Divorced. Found love of her life recently in 2016 (Angie). 3 sons. 5 grandkids.    Daughter (had at age 14 and was adopted). To care for [name] her granddaughter when daughter passes- daughter on hospice.       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      Hobbies: grandchildren, personal training at [facility] [employer] [facility], time with dog   Social Determinants of Health   Financial Resource Strain: Low Risk  (06/02/2022)   Overall Financial Resource Strain (CARDIA)    Difficulty of Paying Living Expenses: Not hard at all  Food Insecurity: No Food Insecurity (06/02/2022)   Hunger Vital Sign    Worried About Running Out of Food in the Last Year: Never true    Ran Out of Food in the Last Year: Never true  Transportation Needs: No Transportation Needs (06/02/2022)   PRAPARE - Hydrologist (Medical): No    Lack of Transportation (Non-Medical): No  Physical Activity: Inactive (06/02/2022)   Exercise Vital Sign    Days of Exercise  per Week: 0 days    Minutes of Exercise per Session: 0 min  Stress: No Stress Concern Present (06/02/2022)   [place]    Feeling of Stress : Not at all  Social Connections: Moderately Isolated (06/02/2022)   Social Connection and Isolation Panel [NHANES]    Frequency of Communication with Friends and Family: More than three times a week    Frequency of Social Gatherings with Friends and Family: More than three times a week    Attends Religious Services: More than 4 times per year    Active Member of [employer] or Organizations: No    Attends Archivist Meetings: Never    Marital Status: Divorced    Tobacco Counseling Counseling given: Not Answered Tobacco comments: Former smoker with no desire to smoke again. Smoking years edited at patients request to include periods when she did not smoke.   Clinical Intake:  Pre-visit preparation completed: Yes  Pain : No/denies pain     BMI - recorded: 40.93 Nutritional Status: BMI > 30  Obese Nutritional Risks: None Diabetes: No  How often do you need to have someone help you when you read instructions, pamphlets, or other written materials from your doctor or pharmacy?: 1 - Never  Diabetic?no  Interpreter Needed?: No  Information entered by :: Charlott Rakes, LPN   Activities of Daily Living    06/02/2022    1:11 PM  In your present state of health, do you have any difficulty performing the following activities:  Hearing? 1  Comment tinnitus and hoh  Vision? 0  Difficulty concentrating or making decisions? 0  Walking or climbing stairs?  0  Dressing or bathing? 0  Doing errands, shopping? 0  Preparing Food and eating ? N  Using the Toilet? N  In the past six months, have you accidently leaked urine? Y  Comment wears a pad  Do you have problems with loss of bowel control? N  Managing your Medications? N  Managing your Finances? N  Housekeeping or  managing your Housekeeping? N    Patient Care Team: Marin Olp, MD as PCP - General (Family Medicine) Clent Jacks, MD as Consulting Physician (Ophthalmology)  Indicate any recent Medical Services you may have received from other than [facility] providers in the past year (date may be approximate).     Assessment:   This is a routine wellness examination for Reilyn.  Hearing/Vision screen Hearing Screening - Comments:: Pt has tinnitus which affects hearing with slight hearing loss  Vision Screening - Comments:: Pt follows up with Dr Katy Fitch for annual eye exams   Dietary issues and exercise activities discussed: Current Exercise Habits: The patient does not participate in regular exercise at present   Goals Addressed             This Visit's Progress    Patient Stated       Lose weight        Depression Screen    06/02/2022    1:06 PM 05/30/2022    8:57 AM 02/27/2022    1:35 PM 12/29/2020   11:06 AM 07/31/2020   10:19 AM 12/09/2019    8:37 AM 02/14/2019    1:12 PM  PHQ 2/9 Scores  PHQ - 2 Score '1 2 1 '$ 0 0 0 0  PHQ- 9 Score  2 1 0 1 0 0    Fall Risk    06/02/2022    1:11 PM 02/27/2022    1:35 PM 12/29/2020   11:05 AM 03/31/2019   11:16 AM 01/10/2018    9:43 AM  Fall Risk   Falls in the past year? 0 0 0 0 No  Number falls in past yr: 0 0 0    Injury with Fall? 0 0 0    Risk for fall due to : Impaired vision No Fall Risks     Follow up Falls prevention discussed Falls evaluation completed       FALL RISK PREVENTION PERTAINING TO THE HOME:  Any stairs in or around the home? No  If so, are there any without handrails? No  Home free of loose throw rugs in walkways, pet beds, electrical cords, etc? Yes  Adequate lighting in your home to reduce risk of falls? Yes   ASSISTIVE DEVICES UTILIZED TO PREVENT FALLS:  Life alert? No  Use of a cane, walker or w/c? Yes  Grab bars in the bathroom? Yes  Shower chair or bench in shower? Yes  Elevated toilet seat or a  handicapped toilet? No   TIMED UP AND GO:  Was the test performed? No .   Cognitive Function:        06/02/2022    1:24 PM  6CIT Screen  What Year? 0 points  What month? 0 points  What time? 0 points  Count back from 20 0 points  Months in reverse 0 points  Repeat phrase 0 points  Total Score 0 points    Immunizations Immunization History  Administered Date(s) Administered   Influenza, High Dose Seasonal PF 09/29/2016, 09/05/2017, 08/19/2018   Influenza,inj,Quad PF,6+ Mos 08/24/2015   Influenza,inj,quad, With Preservative 08/27/2017   Moderna Sars-Covid-2  Vaccination 02/14/2020, 03/07/2020   Pneumococcal Conjugate-13 03/12/2015   Pneumococcal Polysaccharide-23 01/09/2017   Td 04/24/2016   Zoster, Live 03/12/2015    TDAP status: Up to date  Flu Vaccine status: Declined, Education has been provided regarding the importance of this vaccine but patient still declined. Advised may receive this vaccine at local pharmacy or Health Dept. Aware to provide a copy of the vaccination record if obtained from local pharmacy or Health Dept. Verbalized acceptance and understanding.  Pneumococcal vaccine status: Up to date  Covid-19 vaccine status: Completed vaccines  Qualifies for Shingles Vaccine? Yes   Zostavax completed No   Shingrix Completed?: No.    Education has been provided regarding the importance of this vaccine. Patient has been advised to call insurance company to determine out of pocket expense if they have not yet received this vaccine. Advised may also receive vaccine at local pharmacy or Health Dept. Verbalized acceptance and understanding.  Screening Tests Health Maintenance  Topic Date Due   COLONOSCOPY (Pts 45-54yr Insurance coverage will need to be confirmed)  05/11/2020   MAMMOGRAM  08/24/2020   COVID-19 Vaccine (3 - Moderna series) 06/15/2022 (Originally 05/02/2020)   Zoster Vaccines- Shingrix (1 of 2) 08/30/2022 (Originally 05/27/1997)   INFLUENZA VACCINE   07/04/2022   TETANUS/TDAP  04/24/2026   Pneumonia Vaccine 75 Years old  Completed   DEXA SCAN  Completed   Hepatitis C Screening  Completed   HPV VACCINES  Aged Out    Health Maintenance  Health Maintenance Due  Topic Date Due   COLONOSCOPY (Pts 45-439yrInsurance coverage will need to be confirmed)  05/11/2020   MAMMOGRAM  08/24/2020    Colorectal cancer screening: Type of screening: Colonoscopy. Completed 05/12/15. Repeat every 5 years  Mammogram status: Ordered 06/02/22. Pt provided with contact info and advised to call to schedule appt.   Bone Density status: Completed 09/11/17. Results reflect: Bone density results: NORMAL. Repeat every 2 years.  Additional Screening:  Hepatitis C Screening:  Completed 01/18/17  Vision Screening: Recommended annual ophthalmology exams for early detection of glaucoma and other disorders of the eye. Is the patient up to date with their annual eye exam?  Yes  Who is the provider or what is the name of the office in which the patient attends annual eye exams? Dr GrKaty FitchIf pt is not established with a provider, would they like to be referred to a provider to establish care? No .   Dental Screening: Recommended annual dental exams for proper oral hygiene  Community Resource Referral / Chronic Care Management: CRR required this visit?  No   CCM required this visit?  No      Plan:     I have personally reviewed and noted the following in the patient's chart:   Medical and social history Use of alcohol, tobacco or illicit drugs  Current medications and supplements including opioid prescriptions. Patient is currently taking opioid prescriptions. Information provided to patient regarding non-opioid alternatives. Patient advised to discuss non-opioid treatment plan with their provider. Functional ability and status Nutritional status Physical activity Advanced directives List of other physicians Hospitalizations, surgeries, and ER visits in  previous 12 months Vitals Screenings to include cognitive, depression, and falls Referrals and appointments  In addition, I have reviewed and discussed with patient certain preventive protocols, quality metrics, and best practice recommendations. A written personalized care plan for preventive services as well as general preventive health recommendations were provided to patient.     TiWillette Brace  LPN   08/27/2682   Nurse Notes: None

## 2022-06-02 NOTE — Telephone Encounter (Signed)
Pt states prescription was sent to incorrect pharmacy. Requests script to be resent.    LAST APPOINTMENT DATE:   05/30/22 OV with PCP   NEXT APPOINTMENT DATE: 10/20/22 CPE with PCP   MEDICATION: clonazePAM (KLONOPIN) 1 MG tablet [[id number]]    Is the patient out of medication?  yes  PHARMACY: [facility] Store ID: [id number] [place], [place] [address] [phone]

## 2022-06-05 ENCOUNTER — Telehealth: Payer: Self-pay | Admitting: Family Medicine

## 2022-06-05 MED ORDER — CLONAZEPAM 1 MG PO TABS
ORAL_TABLET | ORAL | 0 refills | Status: DC
Start: 2022-06-05 — End: 2022-10-20

## 2022-06-05 NOTE — Telephone Encounter (Signed)
This has been sent to [facility] Yong Channel [facility]

## 2022-06-05 NOTE — Telephone Encounter (Signed)
Pt states her medication keeps getting sent to [facility] and she is needing it fixed asap.

## 2022-06-05 NOTE — Addendum Note (Signed)
Addended by: Clyde Lundborg A on: 06/05/2022 08:47 AM   Modules accepted: Orders

## 2022-06-06 ENCOUNTER — Encounter: Payer: Self-pay | Admitting: Family Medicine

## 2022-06-08 ENCOUNTER — Telehealth: Payer: Self-pay | Admitting: Family Medicine

## 2022-06-08 NOTE — Telephone Encounter (Signed)
Patient requests a Referral for 12 weeks of Physical Therapy. Patient requests to be called at ph# [phone] once Referral has been placed.

## 2022-06-09 ENCOUNTER — Ambulatory Visit
Admission: RE | Admit: 2022-06-09 | Discharge: 2022-06-09 | Disposition: A | Discharge status: Home / Self Care | Payer: Medicare HMO | Source: Ambulatory Visit | Attending: Family Medicine | Admitting: Family Medicine

## 2022-06-09 ENCOUNTER — Encounter: Payer: Self-pay | Admitting: Family Medicine

## 2022-06-09 DIAGNOSIS — R109 Unspecified abdominal pain: Secondary | ICD-10-CM | POA: Diagnosis not present

## 2022-06-12 ENCOUNTER — Encounter: Payer: Self-pay | Admitting: Family Medicine

## 2022-06-12 ENCOUNTER — Other Ambulatory Visit: Payer: Self-pay | Admitting: Family Medicine

## 2022-06-12 ENCOUNTER — Other Ambulatory Visit: Payer: Self-pay

## 2022-06-12 DIAGNOSIS — M869 Osteomyelitis, unspecified: Secondary | ICD-10-CM

## 2022-06-12 DIAGNOSIS — R1011 Right upper quadrant pain: Secondary | ICD-10-CM

## 2022-06-12 DIAGNOSIS — K760 Fatty (change of) liver, not elsewhere classified: Secondary | ICD-10-CM | POA: Insufficient documentation

## 2022-06-13 ENCOUNTER — Other Ambulatory Visit: Payer: Self-pay

## 2022-06-13 DIAGNOSIS — R110 Nausea: Secondary | ICD-10-CM

## 2022-06-13 DIAGNOSIS — R1011 Right upper quadrant pain: Secondary | ICD-10-CM

## 2022-06-13 NOTE — Telephone Encounter (Signed)
Referral placed.

## 2022-06-21 DIAGNOSIS — M9902 Segmental and somatic dysfunction of thoracic region: Secondary | ICD-10-CM | POA: Diagnosis not present

## 2022-06-21 DIAGNOSIS — M9905 Segmental and somatic dysfunction of pelvic region: Secondary | ICD-10-CM | POA: Diagnosis not present

## 2022-06-21 DIAGNOSIS — M5386 Other specified dorsopathies, lumbar region: Secondary | ICD-10-CM | POA: Diagnosis not present

## 2022-06-21 DIAGNOSIS — M9903 Segmental and somatic dysfunction of lumbar region: Secondary | ICD-10-CM | POA: Diagnosis not present

## 2022-06-23 DIAGNOSIS — M9903 Segmental and somatic dysfunction of lumbar region: Secondary | ICD-10-CM | POA: Diagnosis not present

## 2022-06-23 DIAGNOSIS — M5386 Other specified dorsopathies, lumbar region: Secondary | ICD-10-CM | POA: Diagnosis not present

## 2022-06-23 DIAGNOSIS — M9905 Segmental and somatic dysfunction of pelvic region: Secondary | ICD-10-CM | POA: Diagnosis not present

## 2022-06-23 DIAGNOSIS — M9902 Segmental and somatic dysfunction of thoracic region: Secondary | ICD-10-CM | POA: Diagnosis not present

## 2022-06-30 DIAGNOSIS — M9903 Segmental and somatic dysfunction of lumbar region: Secondary | ICD-10-CM | POA: Diagnosis not present

## 2022-06-30 DIAGNOSIS — M9905 Segmental and somatic dysfunction of pelvic region: Secondary | ICD-10-CM | POA: Diagnosis not present

## 2022-06-30 DIAGNOSIS — M9902 Segmental and somatic dysfunction of thoracic region: Secondary | ICD-10-CM | POA: Diagnosis not present

## 2022-06-30 DIAGNOSIS — M5386 Other specified dorsopathies, lumbar region: Secondary | ICD-10-CM | POA: Diagnosis not present

## 2022-07-10 ENCOUNTER — Ambulatory Visit
Admission: RE | Admit: 2022-07-10 | Discharge: 2022-07-10 | Disposition: A | Discharge status: Home / Self Care | Payer: Medicare HMO | Source: Ambulatory Visit | Attending: Family Medicine | Admitting: Family Medicine

## 2022-07-10 ENCOUNTER — Encounter: Payer: Self-pay | Admitting: Family Medicine

## 2022-07-10 DIAGNOSIS — Z1231 Encounter for screening mammogram for malignant neoplasm of breast: Secondary | ICD-10-CM | POA: Diagnosis not present

## 2022-07-12 ENCOUNTER — Ambulatory Visit
Admission: RE | Admit: 2022-07-12 | Discharge: 2022-07-12 | Disposition: A | Discharge status: Home / Self Care | Payer: Medicare HMO | Source: Ambulatory Visit | Attending: Family Medicine | Admitting: Family Medicine

## 2022-07-12 ENCOUNTER — Encounter (INDEPENDENT_AMBULATORY_CARE_PROVIDER_SITE_OTHER): Payer: Self-pay

## 2022-07-12 DIAGNOSIS — R1011 Right upper quadrant pain: Secondary | ICD-10-CM

## 2022-07-12 DIAGNOSIS — R109 Unspecified abdominal pain: Secondary | ICD-10-CM | POA: Diagnosis not present

## 2022-07-13 ENCOUNTER — Telehealth: Payer: Self-pay | Admitting: Family Medicine

## 2022-07-13 NOTE — Telephone Encounter (Signed)
FYI--Upon calling patient to schedule for PT, patient states: - Results from CT Abdomen Pelvis WO contrast were visible to her this morning - She would like to discuss results further with PCP.   I informed patient that these results have not been viewed yet by provider but once they are, PCP will be in touch with next steps.

## 2022-07-19 DIAGNOSIS — M9903 Segmental and somatic dysfunction of lumbar region: Secondary | ICD-10-CM | POA: Diagnosis not present

## 2022-07-19 DIAGNOSIS — M5386 Other specified dorsopathies, lumbar region: Secondary | ICD-10-CM | POA: Diagnosis not present

## 2022-07-19 DIAGNOSIS — M9905 Segmental and somatic dysfunction of pelvic region: Secondary | ICD-10-CM | POA: Diagnosis not present

## 2022-07-19 DIAGNOSIS — M9902 Segmental and somatic dysfunction of thoracic region: Secondary | ICD-10-CM | POA: Diagnosis not present

## 2022-07-24 ENCOUNTER — Ambulatory Visit: Payer: Medicare HMO | Admitting: Physical Therapy

## 2022-08-04 ENCOUNTER — Other Ambulatory Visit: Payer: Self-pay | Admitting: Family Medicine

## 2022-08-08 ENCOUNTER — Ambulatory Visit: Payer: Medicare HMO | Admitting: Physical Therapy

## 2022-08-08 DIAGNOSIS — M5459 Other low back pain: Secondary | ICD-10-CM

## 2022-08-08 DIAGNOSIS — M869 Osteomyelitis, unspecified: Secondary | ICD-10-CM | POA: Diagnosis not present

## 2022-08-08 DIAGNOSIS — M6281 Muscle weakness (generalized): Secondary | ICD-10-CM | POA: Diagnosis not present

## 2022-08-08 NOTE — Therapy (Signed)
OUTPATIENT PHYSICAL THERAPY LOWER EXTREMITY EVALUATION   Patient Name: Angie Velasquez MRN: [id number] DOB:[birth year], 76 y.o., female Today's Date: 08/08/2022   PT End of Session - 08/14/22 0904     Visit Number 1    Number of Visits 12    Date for PT Re-Evaluation 09/19/22    Authorization Type [employer]    PT Start Time 1300    PT Stop Time 4627    PT Time Calculation (min) 45 min    Activity Tolerance Patient tolerated treatment well    Behavior During Therapy WFL for tasks assessed/performed             Past Medical History:  Diagnosis Date   Anxiety    years ago - no longer an issue   Arthritis    gout   Cataracts, bilateral    Complication of anesthesia    Fracture of pelvis (HCC)    GERD (gastroesophageal reflux disease)    Hx of chest pain    "anxiety"   Hyperlipidemia    INSOMNIA 10/14/2008   in past   Joint pain    Kidney problem    Low blood sugar    eats several smaller meals a day   Obesity    Palpitations    Stress related    Proteinuria    H/O   Tobacco abuse    quit smoking 08/2013   Past Surgical History:  Procedure Laterality Date   ABDOMINAL HYSTERECTOMY     APPENDECTOMY     with hysterectomy   childbirth     x4   CHOLECYSTECTOMY  2001   EYE SURGERY Bilateral 2015   cataract surgery with lens implant   NEPHRECTOMY  1992   complex cystic mass - nephrectomy right   ORIF ANKLE FRACTURE Right 07/02/2014   Procedure: OPEN REDUCTION INTERNAL FIXATION (ORIF) RIGHT ANKLE BIMALLOELAR FRACTURE;  Surgeon: Wylene Simmer, MD;  Location: [place];  Service: Orthopedics;  Laterality: Right;   OTHER SURGICAL HISTORY     hysterectomy, R ovary remains   SHOULDER SURGERY     bone spurs left   Patient Active Problem List   Diagnosis Date Noted   Fatty liver 06/12/2022   Snoring 01/06/2020   CKD (chronic kidney disease), stage III ([place]) 02/14/2019   Osteoarthritis of spine with radiculopathy, cervical region 02/12/2018   Intertrigo  01/10/2018   History of osteitis pubis 12/19/2017   Prediabetes 10/22/2017   Hyperglycemia 09/18/2017   Atypical chest pain 03/26/2017   Depression 12/19/2016   Essential hypertension 01/04/2016   Eczema 06/29/2015   Gout 03/12/2015   S/p nephrectomy 03/12/2015   Hyperlipidemia 03/12/2015   Tinnitus 03/12/2015   COPD (chronic obstructive pulmonary disease) ([place]) 03/12/2015   History of colonic polyps 03/12/2015   Bimalleolar fracture 07/02/2014   Arthritis    Former smoker    Anxiety state 12/09/2009   Vitamin D deficiency 05/28/2009   Hematuria, unspecified 05/26/2009   Contact dermatitis and other eczema, due to unspecified cause 05/26/2009   Arthropathy 05/26/2009   Insomnia, unspecified 04/27/2009   DDD (degenerative disc disease), lumbosacral 01/20/2009   Hearing loss 01/20/2009   Sialoadenitis 01/20/2009   GERD 01/23/2008   MICROALBUMINURIA 01/23/2008   Microscopic hematuria 01/23/2008   UTI (urinary tract infection) 01/08/2008    PCP: Garret Reddish  REFERRING PROVIDER: Garret Reddish  REFERRING DIAG: Osteitis Pubis  THERAPY DIAG:  Osteitis pubis ([place])  Other low back pain  Muscle weakness (generalized)  Rationale for Evaluation and Treatment  Rehabilitation  ONSET DATE:   SUBJECTIVE:   SUBJECTIVE STATEMENT   Pt has had ongoing pain for years, seen previously in 2019 for same pain. She is Using RW when walking longer distance. Feels legs get tired with standing/walking. States no pain yesterday for some reason.  Also reports Pain in bil lateral sides/torso at times  Did have R sciatic pain Main/ongoing pain is in pubic symphysis region. States sharp pain with weight bearing, has been limiting her mobility in the last few years.   PERTINENT HISTORY:   PAIN:  Are you having pain? Yes: NPRS scale: 0-10/10 Pain location: pubic Pain description: sore, variable  Aggravating factors: standing, walking.  Relieving factors: sitting.   PRECAUTIONS:  None  WEIGHT BEARING RESTRICTIONS No  FALLS:  Has patient fallen in last 6 months? No  LIVING ENVIRONMENT: Lives with: lives alone Lives in: House/apartment Stairs: Yes: Internal: no steps; none and External: 3 steps; on right going up and on left going up Has following equipment at home: Walker - 2 wheeled  PLOF: [place]  decreased pain in groin, increased LE strength, walking     OBJECTIVE:   DIAGNOSTIC FINDINGS:  DG/ 2020  No recent imaging of pelvis.  FINDINGS: Hip and SI joint spaces preserved.   Osseous mineralization normal.   Narrowing, spurring, and sclerosis at pubic symphysis consistent with history of osteitis pubis.   No acute fracture, dislocation, or bone destruction.   Facet degenerative changes at visualized lower lumbar spine.  COGNITION:  Overall cognitive status: Within functional limits for tasks assessed     SENSATION: WFL   PALPATION:   LOWER EXTREMITY ROM:  Lumbar ROM: Mild/mod limitation for flexion, ext, SB/WFL,   Hip ROM: WFL, mild limitation for rotation- due to pain in groin.    LOWER EXTREMITY MMT:  Hips: 4/5,  Knees: 5/5   LOWER EXTREMITY SPECIAL TESTS:    GAIT: Distance walked: 100 ft Assistive device utilized: Walker - 2 wheeled Level of assistance: Modified independence Comments: slower speed, slight fwd trunk flexion.     TODAY'S TREATMENT:    PATIENT EDUCATION:  Education details: PT POC, exam findings, HEP Person educated: Patient Education method: Explanation, Demonstration, Tactile cues, Verbal cues, and Handouts Education comprehension: verbalized understanding, returned demonstration, verbal cues required, tactile cues required, and needs further education   HOME EXERCISE PROGRAM:   ASSESSMENT:  CLINICAL IMPRESSION: Patient presents with primary complaint of increased pain in pubic region, pain consistent with OA and osteitis pubis. She has had ongoing pain , with increased  pain in standing and with walking. She is overall somewhat deconditioned, due to use of walker and ongoing pain for a few years, and has low tolerance for standing and walking activity. She has decreased strength and stability in hips, and lack of consistent HEP for her dx. Pt with decreased ability for full functional activities due to pain and deficit and will benefit from skilled PT to improve.    OBJECTIVE IMPAIRMENTS Abnormal gait, decreased activity tolerance, decreased balance, decreased endurance, decreased knowledge of use of DME, decreased mobility, difficulty walking, decreased ROM, decreased strength, increased muscle spasms, improper body mechanics, and pain.   ACTIVITY LIMITATIONS lifting, bending, standing, squatting, stairs, and transfers  PARTICIPATION LIMITATIONS: meal prep, cleaning, laundry, driving, shopping, community activity, and yard work  PERSONAL FACTORS Time since onset of injury/illness/exacerbation are also affecting patient's functional outcome.   REHAB POTENTIAL: Good  CLINICAL DECISION MAKING: Stable/uncomplicated  EVALUATION COMPLEXITY: Low   GOALS: Goals  reviewed with patient? Yes  SHORT TERM GOALS: Target date: 08/22/2022   Pt to be independent with initial HEP  Goal status: INITIAL     LONG TERM GOALS: Target date: 09/19/2022   Pt to be independent with final HEP :  Goal status: INITIAL  2.  Pt to report decreased pain in pubic symphysis to 0-3/10 with standing activity for at least 30 min.   Goal status: INITIAL  3.  Pt to report decreased use of RW, with ability for walking in her house, without RW, 50 % of the time, or as pain allows.   Goal status: INITIAL  4.  Pt to demo improved strength of bil hips to at least 4+/5 to improve stability and pain.   Goal status: INITIAL    PLAN: PT FREQUENCY: 1-2x/week  PT DURATION: 6 weeks  PLANNED INTERVENTIONS: Therapeutic exercises, Therapeutic activity, Neuromuscular re-education,  Balance training, Gait training, Patient/Family education, Self Care, Joint mobilization, Joint manipulation, Stair training, DME instructions, Aquatic Therapy, Dry Needling, Electrical stimulation, Spinal manipulation, Spinal mobilization, Cryotherapy, Moist heat, Taping, Vasopneumatic device, Traction, Ultrasound, Ionotophoresis '4mg'$ /ml Dexamethasone, and Manual therapy  PLAN FOR NEXT SESSION:    Lyndee Hensen, PT, DPT 9:06 PM  08/14/22

## 2022-08-14 ENCOUNTER — Encounter: Payer: Self-pay | Admitting: Physical Therapy

## 2022-08-15 ENCOUNTER — Encounter: Payer: Self-pay | Admitting: Family Medicine

## 2022-08-15 ENCOUNTER — Ambulatory Visit (INDEPENDENT_AMBULATORY_CARE_PROVIDER_SITE_OTHER): Payer: Medicare HMO | Admitting: Family Medicine

## 2022-08-15 VITALS — BP 120/72 | HR 91 | Temp 98.3°F | Ht 68.0 in | Wt 266.3 lb

## 2022-08-15 DIAGNOSIS — R739 Hyperglycemia, unspecified: Secondary | ICD-10-CM

## 2022-08-15 DIAGNOSIS — R809 Proteinuria, unspecified: Secondary | ICD-10-CM

## 2022-08-15 DIAGNOSIS — K760 Fatty (change of) liver, not elsewhere classified: Secondary | ICD-10-CM | POA: Diagnosis not present

## 2022-08-15 DIAGNOSIS — E785 Hyperlipidemia, unspecified: Secondary | ICD-10-CM | POA: Diagnosis not present

## 2022-08-15 MED ORDER — OZEMPIC (0.25 OR 0.5 MG/DOSE) 2 MG/1.5ML ~~LOC~~ SOPN: PEN_INJECTOR | SUBCUTANEOUS | 0 refills | Status: DC

## 2022-08-15 NOTE — Patient Instructions (Addendum)
[facility] contact- lets try to get this completed as soon as possible with your other obligations Please call to schedule visit and/or procedure Address: [address], [place], [place] [address] Phone: [phone]   Trial ozempic 0.25 mg for 4 weeks then increase to 0.25 for 4 weeks. Around 6 weeks let me know how you are doing- may go up to 1 mg depending on how you are doing  Recommended follow up: Return for next already scheduled visit or sooner if needed.

## 2022-08-15 NOTE — Progress Notes (Signed)
Phone [phone] In person visit   Subjective:   Angie Velasquez is a 76 y.o. year old very pleasant female patient who presents for/with See problem oriented charting Chief Complaint  Patient presents with   Follow-up    Pt is here to review CT scan, pt states she also sees on mychart that she does not have htn.   Past Medical History-  Patient Active Problem List   Diagnosis Date Noted   Depression 12/19/2016    Priority: High   S/p nephrectomy 03/12/2015    Priority: High   Anxiety state 12/09/2009    Priority: High   Fatty liver 06/12/2022    Priority: Medium    CKD (chronic kidney disease), stage III ([place]) 02/14/2019    Priority: Medium    Prediabetes 10/22/2017    Priority: Medium    Hyperglycemia 09/18/2017    Priority: Medium    Gout 03/12/2015    Priority: Medium    Hyperlipidemia 03/12/2015    Priority: Medium    COPD (chronic obstructive pulmonary disease) ([place]) 03/12/2015    Priority: Medium    Arthritis     Priority: Medium    Former smoker     Priority: Medium    Vitamin D deficiency 05/28/2009    Priority: Medium    Hematuria, unspecified 05/26/2009    Priority: Medium    Hearing loss 01/20/2009    Priority: Medium    MICROALBUMINURIA 01/23/2008    Priority: Medium    Osteoarthritis of spine with radiculopathy, cervical region 02/12/2018    Priority: Low   Intertrigo 01/10/2018    Priority: Low   History of osteitis pubis 12/19/2017    Priority: Low   Eczema 06/29/2015    Priority: Low   Tinnitus 03/12/2015    Priority: Low   History of colonic polyps 03/12/2015    Priority: Low   Bimalleolar fracture 07/02/2014    Priority: Low   Arthropathy 05/26/2009    Priority: Low   Insomnia, unspecified 04/27/2009    Priority: Low   DDD (degenerative disc disease), lumbosacral 01/20/2009    Priority: Low   Sialoadenitis 01/20/2009    Priority: Low   GERD 01/23/2008    Priority: Low   Microscopic hematuria 01/23/2008     Priority: Low   UTI (urinary tract infection) 01/08/2008    Priority: Low   Snoring 01/06/2020   Atypical chest pain 03/26/2017   Contact dermatitis and other eczema, due to unspecified cause 05/26/2009    Medications- reviewed and updated Current Outpatient Medications  Medication Sig Dispense Refill   albuterol (VENTOLIN HFA) 108 (90 Base) MCG/ACT inhaler      allopurinol (ZYLOPRIM) 300 MG tablet Take 1 tablet (300 mg total) by mouth 2 (two) times daily. 180 tablet 3   CALCIUM-MAGNESIUM-ZINC PO Take by mouth.     clonazePAM (KLONOPIN) 1 MG tablet TAKE 0.5- 1 TABLET(0.5 MG- 1 mg) BY MOUTH AT BEDTIME AS NEEDED. Do not drive for at least 8 hours after taking 90 tablet 0   CRANBERRY PO Take by mouth.     diazepam (VALIUM) 10 MG tablet Take 1 tablet (10 mg total) by mouth every 6 (six) hours as needed (as needed for muscle spasms). 30 tablet 0   diclofenac sodium (VOLTAREN) 1 % GEL Apply topically to affected area qid 100 g 2   gabapentin (NEURONTIN) 300 MG capsule TAKE 1 CAPSULE BY MOUTH UP TO THREE TIMES DAILY OR AS DIRECTED 270 capsule 3   Ginger, Zingiber officinalis, (GINGER  ROOT) 550 MG CAPS Take by mouth.     lisinopril (ZESTRIL) 2.5 MG tablet TAKE 1 TABLET BY MOUTH EVERY DAY 90 tablet 0   meloxicam (MOBIC) 15 MG tablet Take 1 tablet (15 mg total) by mouth daily as needed for pain. 6 tablet 0   metoCLOPramide (REGLAN) 10 MG tablet Take 1 tablet (10 mg total) by mouth daily as needed for nausea. 90 tablet 3   nystatin cream (MYCOSTATIN) APPLY 1 APPLICATION TOPICALLY TO RASH IN GROIN TWICE DAILY FOR UP TO 2 WEEKS MAXIMUM 90 g 2   ondansetron (ZOFRAN-ODT) 4 MG disintegrating tablet Take 1 tablet (4 mg total) by mouth every 8 (eight) hours as needed for nausea or vomiting. 20 tablet 1   OVER THE COUNTER MEDICATION Cherry extract bid     OVER THE COUNTER MEDICATION B-12 one daily     PRILOSEC 20 MG capsule Take 1 capsule (20 mg total) by mouth daily. 90 capsule 3   Semaglutide,0.25 or  0.'5MG'$ /DOS, (OZEMPIC, 0.25 OR 0.5 MG/DOSE,) 2 MG/1.5ML SOPN Inject 0.25 mg as directed once a week for 28 days, THEN 0.5 mg once a week for 28 days. 2.25 mL 0   sertraline (ZOLOFT) 50 MG tablet Take 1 tablet (50 mg total) by mouth daily. 90 tablet 1   simvastatin (ZOCOR) 20 MG tablet Take 1 tablet (20 mg total) by mouth at bedtime. 90 tablet 3   Sodium Bicarbonate POWD by Does not apply route.     traMADol (ULTRAM) 50 MG tablet Take 1 tablet (50 mg total) by mouth every 6 (six) hours as needed for moderate pain or severe pain. 60 tablet 0   triamcinolone cream (KENALOG) 0.1 % Apply 1 application topically 2 (two) times daily. For 7-10 days for recurring eczema issues 80 g 1   TURMERIC PO Take by mouth.     valACYclovir (VALTREX) 1000 MG tablet Take 2 pills twice a day for 1 day at first sign of cold sore 28 tablet 1   Vitamin D, Ergocalciferol, (DRISDOL) 1.25 MG (50000 UNIT) CAPS capsule Take 1 capsule (50,000 Units total) by mouth 2 (two) times a week. 24 capsule 0   No current facility-administered medications for this visit.     Objective:  BP 120/72   Pulse 91   Temp 98.3 F (36.8 C)   Ht '5\' 8"'$  (1.727 m)   Wt 266 lb 4.8 oz (120.8 kg)   SpO2 96%   BMI 40.49 kg/m  Gen: NAD, resting comfortably CV: RRR no murmurs rubs or gallops Lungs: CTAB no crackles, wheeze, rhonchi Ext: no edema Skin: warm, dry, no obvious cyst along midline in the area patient reports prior removal-small scab noted thoracic spine    Assessment and Plan   #Hyperlipidemia S: Compliant with simvastatin 20 mg--> increased to 40 mg (still has a lot of 20 mg tablets so doesn't need refill Lab Results  Component Value Date   CHOL 190 05/17/2022   HDL 58.20 05/17/2022   LDLCALC 112 (H) 05/04/2020   LDLDIRECT 101.0 05/17/2022   TRIG 251.0 (H) 05/17/2022   CHOLHDL 3 05/17/2022  A/P: Mild poor control but increased dose last visit-ideally get LDL at least under 100 if not under 70- update Direct LDL with labs  today-hoping for mild improvement  # Hyperglycemia/insulin resistance/prediabetes S:  Medication: none. Didn't tolerate metformin with weight loss clinic- diarrhea with this.  Exercise and diet- having a hard time with pain issues being able to exercise- using walker even due to  pain and wantint to avoid falls Lab Results  Component Value Date   HGBA1C 5.9 (H) 05/04/2020   HGBA1C 5.9 (H) 12/31/2019   HGBA1C 5.8 (H) 08/28/2019   A/P: hopefully stable or improved- update a1c today.  Did not tolerate metformin-we will trial Ozempic to see if this can help her with weight loss and prediabetes control  #Unilateral kidney/microalbuminuria S: Compliant with lisinopril 2.5 mg (now up to 5 mg)-primarily on for renal protective effects with history of microalbuminuria- no true history of hypertension (erroneously listed int he past- removing from chart/problem list today) A/P: Hoping microalbuminuria is stable or improved on higher lisinopril-update with labs-see discussion below about removing hypertension diagnosis  #fatty liver- noted on abdominal ultrasound July 2023- LFTs have been ok- knows importance of weight loss -see discussion on ozempic above - does not drink- cant remember last drink -We also reviewed prior right upper quadrant ultrasound July 2023 and CT scan August 2023  Recommended follow up: Return for next already scheduled visit or sooner if needed. Future Appointments  Date Time Provider [place]  08/17/2022 10:15 AM Lyndee Hensen, PT [facility] None  08/24/2022 10:15 AM Lyndee Hensen, PT [facility] None  08/31/2022 12:15 PM Lyndee Hensen, PT [facility] None  10/20/2022  2:00 PM Marin Olp, MD [facility]  06/21/2023  1:00 PM [facility] HEALTH COACH [facility]    Lab/Order associations:   ICD-10-CM   1. Microalbuminuria  R80.9 Microalbumin / creatinine urine ratio    2. Fatty liver  K76.0 Comprehensive metabolic panel    3. Hyperglycemia  R73.9 HgB A1c     4. Hyperlipidemia, unspecified hyperlipidemia type  E78.5 CBC with Differential/Platelet    LDL cholesterol, direct      Meds ordered this encounter  Medications   Semaglutide,0.25 or 0.'5MG'$ /DOS, (OZEMPIC, 0.25 OR 0.5 MG/DOSE,) 2 MG/1.5ML SOPN    Sig: Inject 0.25 mg as directed once a week for 28 days, THEN 0.5 mg once a week for 28 days.    Dispense:  2.25 mL    Refill:  0    Please provide pen needles    Return precautions advised.  Garret Reddish, MD

## 2022-08-17 ENCOUNTER — Encounter: Payer: Self-pay | Admitting: Physical Therapy

## 2022-08-17 ENCOUNTER — Ambulatory Visit: Payer: Medicare HMO | Admitting: Physical Therapy

## 2022-08-17 DIAGNOSIS — M5459 Other low back pain: Secondary | ICD-10-CM | POA: Diagnosis not present

## 2022-08-17 DIAGNOSIS — M6281 Muscle weakness (generalized): Secondary | ICD-10-CM | POA: Diagnosis not present

## 2022-08-17 DIAGNOSIS — M869 Osteomyelitis, unspecified: Secondary | ICD-10-CM | POA: Diagnosis not present

## 2022-08-17 NOTE — Therapy (Signed)
OUTPATIENT PHYSICAL THERAPY LOWER EXTREMITY TREATMENT   Patient Name: Angie Velasquez MRN: [id number] DOB:Jan 25, 1946, 76 y.o., female Today's Date: 08/17/2022   PT End of Session - 08/17/22 1148     Visit Number 2    Number of Visits 12    Date for PT Re-Evaluation 09/19/22    Authorization Type [employer]    PT Start Time 1020    PT Stop Time 1100    PT Time Calculation (min) 40 min    Activity Tolerance Patient tolerated treatment well    Behavior During Therapy WFL for tasks assessed/performed             Past Medical History:  Diagnosis Date   Anxiety    years ago - no longer an issue   Arthritis    gout   Cataracts, bilateral    Complication of anesthesia    Fracture of pelvis (HCC)    GERD (gastroesophageal reflux disease)    Hx of chest pain    "anxiety"   Hyperlipidemia    INSOMNIA 10/14/2008   in past   Joint pain    Kidney problem    Low blood sugar    eats several smaller meals a day   Obesity    Palpitations    Stress related    Proteinuria    H/O   Tobacco abuse    quit smoking 08/2013   Past Surgical History:  Procedure Laterality Date   ABDOMINAL HYSTERECTOMY     APPENDECTOMY     with hysterectomy   childbirth     x4   CHOLECYSTECTOMY  2001   EYE SURGERY Bilateral 2015   cataract surgery with lens implant   NEPHRECTOMY  1992   complex cystic mass - nephrectomy right   ORIF ANKLE FRACTURE Right 07/02/2014   Procedure: OPEN REDUCTION INTERNAL FIXATION (ORIF) RIGHT ANKLE BIMALLOELAR FRACTURE;  Surgeon: Wylene Simmer, MD;  Location: [facility];  Service: Orthopedics;  Laterality: Right;   OTHER SURGICAL HISTORY     hysterectomy, R ovary remains   SHOULDER SURGERY     bone spurs left   Patient Active Problem List   Diagnosis Date Noted   Fatty liver 06/12/2022   Snoring 01/06/2020   CKD (chronic kidney disease), stage III ([place]) 02/14/2019   Osteoarthritis of spine with radiculopathy, cervical region 02/12/2018   Intertrigo  01/10/2018   History of osteitis pubis 12/19/2017   Prediabetes 10/22/2017   Hyperglycemia 09/18/2017   Atypical chest pain 03/26/2017   Depression 12/19/2016   Eczema 06/29/2015   Gout 03/12/2015   S/p nephrectomy 03/12/2015   Hyperlipidemia 03/12/2015   Tinnitus 03/12/2015   COPD (chronic obstructive pulmonary disease) ([place]) 03/12/2015   History of colonic polyps 03/12/2015   Bimalleolar fracture 07/02/2014   Arthritis    Former smoker    Anxiety state 12/09/2009   Vitamin D deficiency 05/28/2009   Hematuria, unspecified 05/26/2009   Contact dermatitis and other eczema, due to unspecified cause 05/26/2009   Arthropathy 05/26/2009   Insomnia, unspecified 04/27/2009   DDD (degenerative disc disease), lumbosacral 01/20/2009   Hearing loss 01/20/2009   Sialoadenitis 01/20/2009   GERD 01/23/2008   MICROALBUMINURIA 01/23/2008   Microscopic hematuria 01/23/2008   UTI (urinary tract infection) 01/08/2008    PCP: Garret Reddish  REFERRING PROVIDER: Garret Reddish  REFERRING DIAG: Osteitis Pubis  THERAPY DIAG:  Osteitis pubis ([place])  Other low back pain  Muscle weakness (generalized)  Rationale for Evaluation and Treatment Rehabilitation  ONSET DATE:  SUBJECTIVE:   SUBJECTIVE STATEMENT Pt with less pain today, not using RW, has been trying not to use it on days where she has less pain. Sciatic pain has been a bit better.     Eval:Pt has had ongoing pain for years, seen previously in 2019 for same pain. She is Using RW when walking longer distance. Feels legs get tired with standing/walking. States no pain yesterday for some reason.  Also reports Pain in bil lateral sides/torso at times  Did have R sciatic pain Main/ongoing pain is in pubic symphysis region. States sharp pain with weight bearing, has been limiting her mobility in the last few years.   PERTINENT HISTORY:   PAIN:  Are you having pain? Yes: NPRS scale: 0-10/10 Pain location: pubic Pain description:  sore, variable  Aggravating factors: standing, walking.  Relieving factors: sitting.   PRECAUTIONS: None  WEIGHT BEARING RESTRICTIONS No  FALLS:  Has patient fallen in last 6 months? No  LIVING ENVIRONMENT: Lives with: lives alone Lives in: House/apartment Stairs: Yes: Internal: no steps; none and External: 3 steps; on right going up and on left going up Has following equipment at home: Walker - 2 wheeled  PLOF: [place]  decreased pain in groin, increased LE strength, walking     OBJECTIVE:   DIAGNOSTIC FINDINGS:  DG/ 2020  No recent imaging of pelvis.  FINDINGS: Hip and SI joint spaces preserved.   Osseous mineralization normal.   Narrowing, spurring, and sclerosis at pubic symphysis consistent with history of osteitis pubis.   No acute fracture, dislocation, or bone destruction.   Facet degenerative changes at visualized lower lumbar spine.  COGNITION:  Overall cognitive status: Within functional limits for tasks assessed     SENSATION: WFL   PALPATION:   LOWER EXTREMITY ROM:  Lumbar ROM: Mild/mod limitation for flexion, ext, SB/WFL,   Hip ROM: WFL, mild limitation for rotation- due to pain in groin.    LOWER EXTREMITY MMT:  Hips: 4/5,  Knees: 5/5   LOWER EXTREMITY SPECIAL TESTS:    GAIT: Distance walked: 100 ft Assistive device utilized: Walker - 2 wheeled Level of assistance: Modified independence Comments: slower speed, slight fwd trunk flexion.     TODAY'S TREATMENT:  08/17/2022 Therapeutic Exercise: Aerobic: recumbent bike L2 x 6 min;  Supine: Shoulder flexion/Cane for back/thoracic stretch x 10;  Hip abd Blue TB x 20 (small ROM);  Hip add ball squeeze x 20;    Seated: Sit to stand x 10, no UE support  Standing: Stretches: LTR x 10;  Piriformis 30 sec x 3 on R modified fig 4,  Hip IR piriformis 30 sec x 3 on R;   Neuromuscular Re-education: Manual Therapy:     PATIENT EDUCATION:  Education details: updated  HEP, discussed a few exercises to avoid for too much pressure on Pubic symphysis.  Person educated: Patient Education method: Explanation, Demonstration, Tactile cues, Verbal cues, and Handouts Education comprehension: verbalized understanding, returned demonstration, verbal cues required, tactile cues required, and needs further education   HOME EXERCISE PROGRAM: Access Code: [email or web address] URL: [email or web address] Date: 08/17/2022 Prepared by: Lyndee Hensen  Exercises - Supine Posterior Pelvic Tilt  - 1 x daily - 1-2 sets - 10 reps - Supine Piriformis Stretch Pulling Heel to Hip  - 2 x daily - 3 reps - 30 hold - Supine Piriformis Stretch with Leg Straight  - 2 x daily - 3 reps - 30 hold - Supine Lower Trunk Rotation  - 1-2 x daily -  10 reps - 5 hold - Supine Shoulder Flexion Extension AAROM with Dowel  - 1 x daily - 1 sets - 10 reps - 5 hold - Hooklying Clamshell with Resistance  - 1 x daily - 2 sets - 10 reps - Supine Hip Adduction Isometric with Ball  - 1 x daily - 2 sets - 10 reps - Sit to Stand  - 1 x daily - 1 sets - 10 reps   ASSESSMENT:  CLINICAL IMPRESSION: 08/17/2022 Pt with no increased pain during session today. She has good tolerance for exercises. Started on light strengthening for LEs, given initial HEP. Plan to progress strength, with attention to hip strength without aggravation of pubic symphysis pain.    Eval:Patient presents with primary complaint of increased pain in pubic region, pain consistent with OA and osteitis pubis. She has had ongoing pain , with increased pain in standing and with walking. She is overall somewhat deconditioned, due to use of walker and ongoing pain for a few years, and has low tolerance for standing and walking activity. She has decreased strength and stability in hips, and lack of consistent HEP for her dx. Pt with decreased ability for full functional activities due to pain and deficit and will benefit from skilled PT to  improve.    OBJECTIVE IMPAIRMENTS Abnormal gait, decreased activity tolerance, decreased balance, decreased endurance, decreased knowledge of use of DME, decreased mobility, difficulty walking, decreased ROM, decreased strength, increased muscle spasms, improper body mechanics, and pain.   ACTIVITY LIMITATIONS lifting, bending, standing, squatting, stairs, and transfers  PARTICIPATION LIMITATIONS: meal prep, cleaning, laundry, driving, shopping, community activity, and yard work  PERSONAL FACTORS Time since onset of injury/illness/exacerbation are also affecting patient's functional outcome.   REHAB POTENTIAL: Good  CLINICAL DECISION MAKING: Stable/uncomplicated  EVALUATION COMPLEXITY: Low   GOALS: Goals reviewed with patient? Yes  SHORT TERM GOALS: Target date: 08/22/2022   Pt to be independent with initial HEP  Goal status: INITIAL     LONG TERM GOALS: Target date: 09/19/2022   Pt to be independent with final HEP :  Goal status: INITIAL  2.  Pt to report decreased pain in pubic symphysis to 0-3/10 with standing activity for at least 30 min.   Goal status: INITIAL  3.  Pt to report decreased use of RW, with ability for walking in her house, without RW, 50 % of the time, or as pain allows.   Goal status: INITIAL  4.  Pt to demo improved strength of bil hips to at least 4+/5 to improve stability and pain.   Goal status: INITIAL    PLAN: PT FREQUENCY: 1-2x/week  PT DURATION: 6 weeks  PLANNED INTERVENTIONS: Therapeutic exercises, Therapeutic activity, Neuromuscular re-education, Balance training, Gait training, Patient/Family education, Self Care, Joint mobilization, Joint manipulation, Stair training, DME instructions, Aquatic Therapy, Dry Needling, Electrical stimulation, Spinal manipulation, Spinal mobilization, Cryotherapy, Moist heat, Taping, Vasopneumatic device, Traction, Ultrasound, Ionotophoresis '4mg'$ /ml Dexamethasone, and Manual therapy  PLAN FOR NEXT  SESSION:    Lyndee Hensen, PT, DPT 11:49 AM  08/17/22

## 2022-08-22 ENCOUNTER — Other Ambulatory Visit (INDEPENDENT_AMBULATORY_CARE_PROVIDER_SITE_OTHER): Payer: Medicare HMO

## 2022-08-22 ENCOUNTER — Other Ambulatory Visit: Payer: Self-pay

## 2022-08-22 DIAGNOSIS — E785 Hyperlipidemia, unspecified: Secondary | ICD-10-CM

## 2022-08-22 DIAGNOSIS — I10 Essential (primary) hypertension: Secondary | ICD-10-CM | POA: Diagnosis not present

## 2022-08-22 DIAGNOSIS — R809 Proteinuria, unspecified: Secondary | ICD-10-CM

## 2022-08-22 DIAGNOSIS — R739 Hyperglycemia, unspecified: Secondary | ICD-10-CM

## 2022-08-22 LAB — CBC WITH DIFFERENTIAL/PLATELET
Basophils Absolute: 0.1 10*3/uL (ref 0.0–0.1)
Basophils Relative: 0.8 % (ref 0.0–3.0)
Eosinophils Absolute: 0.2 10*3/uL (ref 0.0–0.7)
Eosinophils Relative: 2.8 % (ref 0.0–5.0)
HCT: 38.7 % (ref 36.0–46.0)
Hemoglobin: 13 g/dL (ref 12.0–15.0)
Lymphocytes Relative: 34.2 % (ref 12.0–46.0)
Lymphs Abs: 2.4 10*3/uL (ref 0.7–4.0)
MCHC: 33.7 g/dL (ref 30.0–36.0)
MCV: 90.5 fl (ref 78.0–100.0)
Monocytes Absolute: 0.6 10*3/uL (ref 0.1–1.0)
Monocytes Relative: 8.3 % (ref 3.0–12.0)
Neutro Abs: 3.8 10*3/uL (ref 1.4–7.7)
Neutrophils Relative %: 53.9 % (ref 43.0–77.0)
Platelets: 212 10*3/uL (ref 150.0–400.0)
RBC: 4.27 Mil/uL (ref 3.87–5.11)
RDW: 15.5 % (ref 11.5–15.5)
WBC: 7.1 10*3/uL (ref 4.0–10.5)

## 2022-08-22 LAB — COMPREHENSIVE METABOLIC PANEL
ALT: 21 U/L (ref 0–35)
AST: 21 U/L (ref 0–37)
Albumin: 4.2 g/dL (ref 3.5–5.2)
Alkaline Phosphatase: 100 U/L (ref 39–117)
BUN: 18 mg/dL (ref 6–23)
CO2: 25 mEq/L (ref 19–32)
Calcium: 9.5 mg/dL (ref 8.4–10.5)
Chloride: 104 mEq/L (ref 96–112)
Creatinine, Ser: 1.18 mg/dL (ref 0.40–1.20)
GFR: 45.27 mL/min — ABNORMAL LOW (ref >60.00)
Glucose, Bld: 103 mg/dL — ABNORMAL HIGH (ref 70–99)
Potassium: 4.1 mEq/L (ref 3.5–5.1)
Sodium: 138 mEq/L (ref 135–145)
Total Bilirubin: 0.3 mg/dL (ref 0.2–1.2)
Total Protein: 7 g/dL (ref 6.0–8.3)

## 2022-08-22 LAB — LDL CHOLESTEROL, DIRECT: Direct LDL: 99 mg/dL

## 2022-08-22 LAB — HEMOGLOBIN A1C: Hgb A1c MFr Bld: 6.3 % (ref 4.6–6.5)

## 2022-08-24 ENCOUNTER — Encounter: Payer: Self-pay | Admitting: Physical Therapy

## 2022-08-24 ENCOUNTER — Ambulatory Visit: Payer: Medicare HMO | Admitting: Physical Therapy

## 2022-08-24 DIAGNOSIS — M6281 Muscle weakness (generalized): Secondary | ICD-10-CM | POA: Diagnosis not present

## 2022-08-24 DIAGNOSIS — M869 Osteomyelitis, unspecified: Secondary | ICD-10-CM

## 2022-08-24 DIAGNOSIS — M5459 Other low back pain: Secondary | ICD-10-CM | POA: Diagnosis not present

## 2022-08-24 NOTE — Therapy (Signed)
OUTPATIENT PHYSICAL THERAPY LOWER EXTREMITY TREATMENT   Patient Name: Angie Velasquez MRN: [id number] DOB:[birth year], 76 y.o., female Today's Date: 08/17/2022   PT End of Session - 08/24/22 1114     Visit Number 3    Number of Visits 12    Date for PT Re-Evaluation 09/19/22    Authorization Type [employer] Medicare    PT Start Time 1018    PT Stop Time 1100    PT Time Calculation (min) 42 min    Activity Tolerance Patient tolerated treatment well    Behavior During Therapy WFL for tasks assessed/performed              Past Medical History:  Diagnosis Date   Anxiety    years ago - no longer an issue   Arthritis    gout   Cataracts, bilateral    Complication of anesthesia    Fracture of pelvis (HCC)    GERD (gastroesophageal reflux disease)    Hx of chest pain    "anxiety"   Hyperlipidemia    INSOMNIA 10/14/2008   in past   Joint pain    Kidney problem    Low blood sugar    eats several smaller meals a day   Obesity    Palpitations    Stress related    Proteinuria    H/O   Tobacco abuse    quit smoking 08/2013   Past Surgical History:  Procedure Laterality Date   ABDOMINAL HYSTERECTOMY     APPENDECTOMY     with hysterectomy   childbirth     x4   CHOLECYSTECTOMY  2001   EYE SURGERY Bilateral 2015   cataract surgery with lens implant   NEPHRECTOMY  1992   complex cystic mass - nephrectomy right   ORIF ANKLE FRACTURE Right 07/02/2014   Procedure: OPEN REDUCTION INTERNAL FIXATION (ORIF) RIGHT ANKLE BIMALLOELAR FRACTURE;  Surgeon: Wylene Simmer, MD;  Location: [facility];  Service: Orthopedics;  Laterality: Right;   OTHER SURGICAL HISTORY     hysterectomy, R ovary remains   SHOULDER SURGERY     bone spurs left   Patient Active Problem List   Diagnosis Date Noted   Fatty liver 06/12/2022   Snoring 01/06/2020   CKD (chronic kidney disease), stage III ([place]) 02/14/2019   Osteoarthritis of spine with radiculopathy, cervical region 02/12/2018    Intertrigo 01/10/2018   History of osteitis pubis 12/19/2017   Prediabetes 10/22/2017   Hyperglycemia 09/18/2017   Atypical chest pain 03/26/2017   Depression 12/19/2016   Eczema 06/29/2015   Gout 03/12/2015   S/p nephrectomy 03/12/2015   Hyperlipidemia 03/12/2015   Tinnitus 03/12/2015   COPD (chronic obstructive pulmonary disease) ([place]) 03/12/2015   History of colonic polyps 03/12/2015   Bimalleolar fracture 07/02/2014   Arthritis    Former smoker    Anxiety state 12/09/2009   Vitamin D deficiency 05/28/2009   Hematuria, unspecified 05/26/2009   Contact dermatitis and other eczema, due to unspecified cause 05/26/2009   Arthropathy 05/26/2009   Insomnia, unspecified 04/27/2009   DDD (degenerative disc disease), lumbosacral 01/20/2009   Hearing loss 01/20/2009   Sialoadenitis 01/20/2009   GERD 01/23/2008   MICROALBUMINURIA 01/23/2008   Microscopic hematuria 01/23/2008   UTI (urinary tract infection) 01/08/2008    PCP: Garret Reddish  REFERRING PROVIDER: Garret Reddish  REFERRING DIAG: Osteitis Pubis  THERAPY DIAG:  Osteitis pubis ([place])  Other low back pain  Muscle weakness (generalized)  Rationale for Evaluation and Treatment Rehabilitation  ONSET DATE:  SUBJECTIVE:   SUBJECTIVE STATEMENT Pt with less pain today, not using RW, has been trying not to use it on days where she has less pain. Sciatic pain has been a bit better.     Eval:Pt has had ongoing pain for years, seen previously in 2019 for same pain. She is Using RW when walking longer distance. Feels legs get tired with standing/walking. States no pain yesterday for some reason.  Also reports Pain in bil lateral sides/torso at times  Did have R sciatic pain Main/ongoing pain is in pubic symphysis region. States sharp pain with weight bearing, has been limiting her mobility in the last few years.   PERTINENT HISTORY:   PAIN:  Are you having pain? Yes: NPRS scale: 0-10/10 Pain location: pubic Pain  description: sore, variable  Aggravating factors: standing, walking.  Relieving factors: sitting.   PRECAUTIONS: None  WEIGHT BEARING RESTRICTIONS No  FALLS:  Has patient fallen in last 6 months? No  LIVING ENVIRONMENT: Lives with: lives alone Lives in: House/apartment Stairs: Yes: Internal: no steps; none and External: 3 steps; on right going up and on left going up Has following equipment at home: Walker - 2 wheeled  PLOF: [place]  decreased pain in groin, increased LE strength, walking     OBJECTIVE:   DIAGNOSTIC FINDINGS:  DG/ 2020  No recent imaging of pelvis.  FINDINGS: Hip and SI joint spaces preserved.   Osseous mineralization normal.   Narrowing, spurring, and sclerosis at pubic symphysis consistent with history of osteitis pubis.   No acute fracture, dislocation, or bone destruction.   Facet degenerative changes at visualized lower lumbar spine.  COGNITION:  Overall cognitive status: Within functional limits for tasks assessed     SENSATION: WFL   PALPATION:   LOWER EXTREMITY ROM:  Lumbar ROM: Mild/mod limitation for flexion, ext, SB/WFL,   Hip ROM: WFL, mild limitation for rotation- due to pain in groin.    LOWER EXTREMITY MMT:  Hips: 4/5,  Knees: 5/5   LOWER EXTREMITY SPECIAL TESTS:    GAIT: Distance walked: 100 ft Assistive device utilized: Walker - 2 wheeled Level of assistance: Modified independence Comments: slower speed, slight fwd trunk flexion.     TODAY'S TREATMENT:  08/24/22: Therapeutic Exercise: Aerobic:  Supine:   Hip abd Black TB with TA x 20 (small ROM);   SLR 2 x 10 bil;   S/L hip abd x15 bil (Pillow btwn legs)  Seated: Sit to stand x 10, no UE support , mat table.  Standing: Hip abd 2x10 bil;  Stretches: LTR x 15;  Piriformis 30 sec x 3 on R modified fig 4,  Neuromuscular Re-education: Manual Therapy:    08/17/2022 Therapeutic Exercise: Aerobic: recumbent bike L2 x 6 min;  Supine:  Shoulder flexion/Cane for back/thoracic stretch x 10;  Hip abd Blue TB x 20 (small ROM);  Hip add ball squeeze x 20;    Seated: Sit to stand x 10, no UE support  Standing: Stretches: LTR x 10;  Piriformis 30 sec x 3 on R modified fig 4,  Hip IR piriformis 30 sec x 3 on R;   Neuromuscular Re-education: Manual Therapy:     PATIENT EDUCATION:  Education details: reviewed HEP Person educated: Patient Education method: Explanation, Demonstration, Tactile cues, Verbal cues, and Handouts Education comprehension: verbalized understanding, returned demonstration, verbal cues required, tactile cues required, and needs further education   HOME EXERCISE PROGRAM: Access Code: [email or web address]    ASSESSMENT:  CLINICAL IMPRESSION: 08/24/2022 Pt  with good ability for strengthening today. Discussed keeping strengthening pain free in pubic symphysis region. Plan to continue strengthening as tolerated.   Eval:Patient presents with primary complaint of increased pain in pubic region, pain consistent with OA and osteitis pubis. She has had ongoing pain , with increased pain in standing and with walking. She is overall somewhat deconditioned, due to use of walker and ongoing pain for a few years, and has low tolerance for standing and walking activity. She has decreased strength and stability in hips, and lack of consistent HEP for her dx. Pt with decreased ability for full functional activities due to pain and deficit and will benefit from skilled PT to improve.    OBJECTIVE IMPAIRMENTS Abnormal gait, decreased activity tolerance, decreased balance, decreased endurance, decreased knowledge of use of DME, decreased mobility, difficulty walking, decreased ROM, decreased strength, increased muscle spasms, improper body mechanics, and pain.   ACTIVITY LIMITATIONS lifting, bending, standing, squatting, stairs, and transfers  PARTICIPATION LIMITATIONS: meal prep, cleaning, laundry, driving, shopping, community  activity, and yard work  PERSONAL FACTORS Time since onset of injury/illness/exacerbation are also affecting patient's functional outcome.   REHAB POTENTIAL: Good  CLINICAL DECISION MAKING: Stable/uncomplicated  EVALUATION COMPLEXITY: Low   GOALS: Goals reviewed with patient? Yes  SHORT TERM GOALS: Target date: 08/22/2022   Pt to be independent with initial HEP  Goal status: INITIAL     LONG TERM GOALS: Target date: 09/19/2022   Pt to be independent with final HEP :  Goal status: INITIAL  2.  Pt to report decreased pain in pubic symphysis to 0-3/10 with standing activity for at least 30 min.   Goal status: INITIAL  3.  Pt to report decreased use of RW, with ability for walking in her house, without RW, 50 % of the time, or as pain allows.   Goal status: INITIAL  4.  Pt to demo improved strength of bil hips to at least 4+/5 to improve stability and pain.   Goal status: INITIAL    PLAN: PT FREQUENCY: 1-2x/week  PT DURATION: 6 weeks  PLANNED INTERVENTIONS: Therapeutic exercises, Therapeutic activity, Neuromuscular re-education, Balance training, Gait training, Patient/Family education, Self Care, Joint mobilization, Joint manipulation, Stair training, DME instructions, Aquatic Therapy, Dry Needling, Electrical stimulation, Spinal manipulation, Spinal mobilization, Cryotherapy, Moist heat, Taping, Vasopneumatic device, Traction, Ultrasound, Ionotophoresis '4mg'$ /ml Dexamethasone, and Manual therapy  PLAN FOR NEXT SESSION:    Lyndee Hensen, PT, DPT 11:17 AM  08/24/22

## 2022-08-29 ENCOUNTER — Encounter: Payer: Self-pay | Admitting: Internal Medicine

## 2022-08-31 ENCOUNTER — Encounter: Payer: Self-pay | Admitting: Physical Therapy

## 2022-08-31 ENCOUNTER — Encounter: Payer: Medicare HMO | Admitting: Physical Therapy

## 2022-08-31 ENCOUNTER — Ambulatory Visit: Payer: Medicare HMO | Admitting: Physical Therapy

## 2022-08-31 DIAGNOSIS — M869 Osteomyelitis, unspecified: Secondary | ICD-10-CM

## 2022-08-31 DIAGNOSIS — M6281 Muscle weakness (generalized): Secondary | ICD-10-CM

## 2022-08-31 DIAGNOSIS — M5459 Other low back pain: Secondary | ICD-10-CM | POA: Diagnosis not present

## 2022-08-31 NOTE — Therapy (Signed)
OUTPATIENT PHYSICAL THERAPY LOWER EXTREMITY TREATMENT   Patient Name: Angie Velasquez MRN: [id number] DOB:14-Sep-1946, 76 y.o., female Today's Date: 08/31/2022   PT End of Session - 08/31/22 1109     Visit Number 4    Number of Visits 12    Date for PT Re-Evaluation 09/19/22    Authorization Type [employer]    PT Start Time 1100    PT Stop Time 1143    PT Time Calculation (min) 43 min    Activity Tolerance Patient tolerated treatment well    Behavior During Therapy WFL for tasks assessed/performed               Past Medical History:  Diagnosis Date   Anxiety    years ago - no longer an issue   Arthritis    gout   Cataracts, bilateral    Complication of anesthesia    Fracture of pelvis (HCC)    GERD (gastroesophageal reflux disease)    Hx of chest pain    "anxiety"   Hyperlipidemia    INSOMNIA 10/14/2008   in past   Joint pain    Kidney problem    Low blood sugar    eats several smaller meals a day   Obesity    Palpitations    Stress related    Proteinuria    H/O   Tobacco abuse    quit smoking 08/2013   Past Surgical History:  Procedure Laterality Date   ABDOMINAL HYSTERECTOMY     APPENDECTOMY     with hysterectomy   childbirth     x4   CHOLECYSTECTOMY  2001   EYE SURGERY Bilateral 2015   cataract surgery with lens implant   NEPHRECTOMY  1992   complex cystic mass - nephrectomy right   ORIF ANKLE FRACTURE Right 07/02/2014   Procedure: OPEN REDUCTION INTERNAL FIXATION (ORIF) RIGHT ANKLE BIMALLOELAR FRACTURE;  Surgeon: Wylene Simmer, MD;  Location: [place];  Service: Orthopedics;  Laterality: Right;   OTHER SURGICAL HISTORY     hysterectomy, R ovary remains   SHOULDER SURGERY     bone spurs left   Patient Active Problem List   Diagnosis Date Noted   Fatty liver 06/12/2022   Snoring 01/06/2020   CKD (chronic kidney disease), stage III ([place]) 02/14/2019   Osteoarthritis of spine with radiculopathy, cervical region 02/12/2018    Intertrigo 01/10/2018   History of osteitis pubis 12/19/2017   Prediabetes 10/22/2017   Hyperglycemia 09/18/2017   Atypical chest pain 03/26/2017   Depression 12/19/2016   Eczema 06/29/2015   Gout 03/12/2015   S/p nephrectomy 03/12/2015   Hyperlipidemia 03/12/2015   Tinnitus 03/12/2015   COPD (chronic obstructive pulmonary disease) ([place]) 03/12/2015   History of colonic polyps 03/12/2015   Bimalleolar fracture 07/02/2014   Arthritis    Former smoker    Anxiety state 12/09/2009   Vitamin D deficiency 05/28/2009   Hematuria, unspecified 05/26/2009   Contact dermatitis and other eczema, due to unspecified cause 05/26/2009   Arthropathy 05/26/2009   Insomnia, unspecified 04/27/2009   DDD (degenerative disc disease), lumbosacral 01/20/2009   Hearing loss 01/20/2009   Sialoadenitis 01/20/2009   GERD 01/23/2008   MICROALBUMINURIA 01/23/2008   Microscopic hematuria 01/23/2008   UTI (urinary tract infection) 01/08/2008    PCP: Garret Reddish  REFERRING PROVIDER: Garret Reddish  REFERRING DIAG: Osteitis Pubis  THERAPY DIAG:  Osteitis pubis ([place])  Other low back pain  Muscle weakness (generalized)  Rationale for Evaluation and Treatment Rehabilitation  ONSET  DATE:   SUBJECTIVE:   SUBJECTIVE STATEMENT Pt says she has had less pain in pelvis overall. Less frequent and less intense. R sciatica is bothering her today.     Eval:Pt has had ongoing pain for years, seen previously in 2019 for same pain. She is Using RW when walking longer distance. Feels legs get tired with standing/walking. States no pain yesterday for some reason.  Also reports Pain in bil lateral sides/torso at times  Did have R sciatic pain Main/ongoing pain is in pubic symphysis region. States sharp pain with weight bearing, has been limiting her mobility in the last few years.   PERTINENT HISTORY:   PAIN:  Are you having pain? Yes: NPRS scale: 0-10/10 Pain location: pubic Pain description: sore,  variable  Aggravating factors: standing, walking.  Relieving factors: sitting.   PRECAUTIONS: None  WEIGHT BEARING RESTRICTIONS No  FALLS:  Has patient fallen in last 6 months? No  LIVING ENVIRONMENT: Lives with: lives alone Lives in: House/apartment Stairs: Yes: Internal: no steps; none and External: 3 steps; on right going up and on left going up Has following equipment at home: Walker - 2 wheeled  PLOF: [place]  decreased pain in groin, increased LE strength, walking     OBJECTIVE:   DIAGNOSTIC FINDINGS:  DG/ 2020  No recent imaging of pelvis.  FINDINGS: Hip and SI joint spaces preserved.   Osseous mineralization normal.   Narrowing, spurring, and sclerosis at pubic symphysis consistent with history of osteitis pubis.   No acute fracture, dislocation, or bone destruction.   Facet degenerative changes at visualized lower lumbar spine.  COGNITION:  Overall cognitive status: Within functional limits for tasks assessed     SENSATION: WFL   PALPATION:   LOWER EXTREMITY ROM:  Lumbar ROM: Mild/mod limitation for flexion, ext, SB/WFL,   Hip ROM: WFL, mild limitation for rotation- due to pain in groin.    LOWER EXTREMITY MMT:  Hips: 4/5,  Knees: 5/5   LOWER EXTREMITY SPECIAL TESTS:    GAIT: Distance walked: 100 ft Assistive device utilized: Walker - 2 wheeled Level of assistance: Modified independence Comments: slower speed, slight fwd trunk flexion.     TODAY'S TREATMENT:  08/31/22: Therapeutic Exercise: Aerobic:  Supine:  Clams alternating Black TB with TA x 20 (small ROM);   SLR 2 x 10 bil;  Hip add squeeze with TA x 15, 5 sec holds;  S/L hip abd x15 bil (Pillow btwn legs)  Seated:[name]  Standing:  Stretches: LTR x 15;  Piriformis 30 sec x 3 on R modified fig 4,  Neuromuscular Re-education: Manual Therapy: DTM/TPR to R glute    08/24/22: Therapeutic Exercise: Aerobic:  Supine:   Hip abd Black TB with TA x 20 (small ROM);    SLR 2 x 10 bil;   S/L hip abd x15 bil (Pillow btwn legs)  Seated: Sit to stand x 10, no UE support , mat table.  Standing: Hip abd 2x10 bil;  Stretches: LTR x 15;  Piriformis 30 sec x 3 on R modified fig 4,  Neuromuscular Re-education: Manual Therapy:    08/17/2022 Therapeutic Exercise: Aerobic: recumbent bike L2 x 6 min;  Supine: Shoulder flexion/Cane for back/thoracic stretch x 10;  Hip abd Blue TB x 20 (small ROM);  Hip add ball squeeze x 20;    Seated: Sit to stand x 10, no UE support  Standing: Stretches: LTR x 10;  Piriformis 30 sec x 3 on R modified fig 4,  Hip  IR piriformis 30 sec x 3 on R;   Neuromuscular Re-education: Manual Therapy:     PATIENT EDUCATION:  Education details: reviewed HEP Person educated: Patient Education method: Explanation, Demonstration, Tactile cues, Verbal cues, and Handouts Education comprehension: verbalized understanding, returned demonstration, verbal cues required, tactile cues required, and needs further education   HOME EXERCISE PROGRAM: Access Code: [email or web address]    ASSESSMENT:  CLINICAL IMPRESSION: 08/31/2022 Pt with good ability for activity. Very challenged with s/l hip abd due to weakness. She has increased muscle tension today in R glute, addressed with DTM, pt with improved pain and tension at end of session. Discussed dry needling as needed next session for relief.   Eval:Patient presents with primary complaint of increased pain in pubic region, pain consistent with OA and osteitis pubis. She has had ongoing pain , with increased pain in standing and with walking. She is overall somewhat deconditioned, due to use of walker and ongoing pain for a few years, and has low tolerance for standing and walking activity. She has decreased strength and stability in hips, and lack of consistent HEP for her dx. Pt with decreased ability for full functional activities due to pain and deficit and will benefit from skilled PT to improve.     OBJECTIVE IMPAIRMENTS Abnormal gait, decreased activity tolerance, decreased balance, decreased endurance, decreased knowledge of use of DME, decreased mobility, difficulty walking, decreased ROM, decreased strength, increased muscle spasms, improper body mechanics, and pain.   ACTIVITY LIMITATIONS lifting, bending, standing, squatting, stairs, and transfers  PARTICIPATION LIMITATIONS: meal prep, cleaning, laundry, driving, shopping, community activity, and yard work  PERSONAL FACTORS Time since onset of injury/illness/exacerbation are also affecting patient's functional outcome.   REHAB POTENTIAL: Good  CLINICAL DECISION MAKING: Stable/uncomplicated  EVALUATION COMPLEXITY: Low   GOALS: Goals reviewed with patient? Yes  SHORT TERM GOALS: Target date: 08/22/2022   Pt to be independent with initial HEP  Goal status: INITIAL     LONG TERM GOALS: Target date: 09/19/2022   Pt to be independent with final HEP :  Goal status: INITIAL  2.  Pt to report decreased pain in pubic symphysis to 0-3/10 with standing activity for at least 30 min.   Goal status: INITIAL  3.  Pt to report decreased use of RW, with ability for walking in her house, without RW, 50 % of the time, or as pain allows.   Goal status: INITIAL  4.  Pt to demo improved strength of bil hips to at least 4+/5 to improve stability and pain.   Goal status: INITIAL    PLAN: PT FREQUENCY: 1-2x/week  PT DURATION: 6 weeks  PLANNED INTERVENTIONS: Therapeutic exercises, Therapeutic activity, Neuromuscular re-education, Balance training, Gait training, Patient/Family education, Self Care, Joint mobilization, Joint manipulation, Stair training, DME instructions, Aquatic Therapy, Dry Needling, Electrical stimulation, Spinal manipulation, Spinal mobilization, Cryotherapy, Moist heat, Taping, Vasopneumatic device, Traction, Ultrasound, Ionotophoresis '4mg'$ /ml Dexamethasone, and Manual therapy  PLAN FOR NEXT SESSION:     Lyndee Hensen, PT, DPT 12:04 PM  08/31/22

## 2022-09-07 ENCOUNTER — Ambulatory Visit: Payer: Medicare HMO | Admitting: Physical Therapy

## 2022-09-07 ENCOUNTER — Encounter: Payer: Self-pay | Admitting: Physical Therapy

## 2022-09-07 DIAGNOSIS — M6281 Muscle weakness (generalized): Secondary | ICD-10-CM | POA: Diagnosis not present

## 2022-09-07 DIAGNOSIS — M5459 Other low back pain: Secondary | ICD-10-CM | POA: Diagnosis not present

## 2022-09-07 DIAGNOSIS — M869 Osteomyelitis, unspecified: Secondary | ICD-10-CM | POA: Diagnosis not present

## 2022-09-07 NOTE — Therapy (Signed)
OUTPATIENT PHYSICAL THERAPY LOWER EXTREMITY TREATMENT   Patient Name: Angie Velasquez MRN: [id number] DOB:February 26, 1946, 76 y.o., female Today's Date: 09/07/2022   PT End of Session - 09/07/22 1518     Visit Number 5    Number of Visits 12    Date for PT Re-Evaluation 09/19/22    Authorization Type [employer] Medicare    PT Start Time 1514    PT Stop Time 3762    PT Time Calculation (min) 42 min    Activity Tolerance Patient tolerated treatment well    Behavior During Therapy WFL for tasks assessed/performed                Past Medical History:  Diagnosis Date   Anxiety    years ago - no longer an issue   Arthritis    gout   Cataracts, bilateral    Complication of anesthesia    Fracture of pelvis ([place])    GERD (gastroesophageal reflux disease)    Hx of chest pain    "anxiety"   Hyperlipidemia    INSOMNIA 10/14/2008   in past   Joint pain    Kidney problem    Low blood sugar    eats several smaller meals a day   Obesity    Palpitations    Stress related    Proteinuria    H/O   Tobacco abuse    quit smoking 08/2013   Past Surgical History:  Procedure Laterality Date   ABDOMINAL HYSTERECTOMY     APPENDECTOMY     with hysterectomy   childbirth     x4   CHOLECYSTECTOMY  2001   EYE SURGERY Bilateral 2015   cataract surgery with lens implant   NEPHRECTOMY  1992   complex cystic mass - nephrectomy right   ORIF ANKLE FRACTURE Right 07/02/2014   Procedure: OPEN REDUCTION INTERNAL FIXATION (ORIF) RIGHT ANKLE BIMALLOELAR FRACTURE;  Surgeon: Wylene Simmer, MD;  Location: [place];  Service: Orthopedics;  Laterality: Right;   OTHER SURGICAL HISTORY     hysterectomy, R ovary remains   SHOULDER SURGERY     bone spurs left   Patient Active Problem List   Diagnosis Date Noted   Fatty liver 06/12/2022   Snoring 01/06/2020   CKD (chronic kidney disease), stage III ([place]) 02/14/2019   Osteoarthritis of spine with radiculopathy, cervical region 02/12/2018    Intertrigo 01/10/2018   History of osteitis pubis 12/19/2017   Prediabetes 10/22/2017   Hyperglycemia 09/18/2017   Atypical chest pain 03/26/2017   Depression 12/19/2016   Eczema 06/29/2015   Gout 03/12/2015   S/p nephrectomy 03/12/2015   Hyperlipidemia 03/12/2015   Tinnitus 03/12/2015   COPD (chronic obstructive pulmonary disease) ([place]) 03/12/2015   History of colonic polyps 03/12/2015   Bimalleolar fracture 07/02/2014   Arthritis    Former smoker    Anxiety state 12/09/2009   Vitamin D deficiency 05/28/2009   Hematuria, unspecified 05/26/2009   Contact dermatitis and other eczema, due to unspecified cause 05/26/2009   Arthropathy 05/26/2009   Insomnia, unspecified 04/27/2009   DDD (degenerative disc disease), lumbosacral 01/20/2009   Hearing loss 01/20/2009   Sialoadenitis 01/20/2009   GERD 01/23/2008   MICROALBUMINURIA 01/23/2008   Microscopic hematuria 01/23/2008   UTI (urinary tract infection) 01/08/2008    PCP: Garret Reddish  REFERRING PROVIDER: Garret Reddish  REFERRING DIAG: Osteitis Pubis  THERAPY DIAG:  Osteitis pubis ([place])  Other low back pain  Muscle weakness (generalized)  Rationale for Evaluation and Treatment Rehabilitation  ONSET DATE:   SUBJECTIVE:   SUBJECTIVE STATEMENT Pt says she has had less pain. She was able to sit and cross her leg which she has not done in years. States massage was helpful last session for muscle tension.     Eval:Pt has had ongoing pain for years, seen previously in 2019 for same pain. She is Using RW when walking longer distance. Feels legs get tired with standing/walking. States no pain yesterday for some reason.  Also reports Pain in bil lateral sides/torso at times  Did have R sciatic pain Main/ongoing pain is in pubic symphysis region. States sharp pain with weight bearing, has been limiting her mobility in the last few years.   PERTINENT HISTORY:   PAIN:  Are you having pain? Yes: NPRS scale: 0-10/10 Pain  location: pubic Pain description: sore, variable  Aggravating factors: standing, walking.  Relieving factors: sitting.   PRECAUTIONS: None  WEIGHT BEARING RESTRICTIONS No  FALLS:  Has patient fallen in last 6 months? No  LIVING ENVIRONMENT: Lives with: lives alone Lives in: House/apartment Stairs: Yes: Internal: no steps; none and External: 3 steps; on right going up and on left going up Has following equipment at home: Walker - 2 wheeled  PLOF: [place]  decreased pain in groin, increased LE strength, walking     OBJECTIVE:   DIAGNOSTIC FINDINGS:  DG/ 2020  No recent imaging of pelvis.  FINDINGS: Hip and SI joint spaces preserved.   Osseous mineralization normal.   Narrowing, spurring, and sclerosis at pubic symphysis consistent with history of osteitis pubis.   No acute fracture, dislocation, or bone destruction.   Facet degenerative changes at visualized lower lumbar spine.  COGNITION:  Overall cognitive status: Within functional limits for tasks assessed     SENSATION: WFL   PALPATION:   LOWER EXTREMITY ROM:  Lumbar ROM: Mild/mod limitation for flexion, ext, SB/WFL,   Hip ROM: WFL, mild limitation for rotation- due to pain in groin.    LOWER EXTREMITY MMT:  Hips: 4/5,  Knees: 5/5   LOWER EXTREMITY SPECIAL TESTS:    GAIT: Distance walked: 100 ft Assistive device utilized: Walker - 2 wheeled Level of assistance: Modified independence Comments: slower speed, slight fwd trunk flexion.     TODAY'S TREATMENT:  09/07/22: Therapeutic Exercise: Aerobic:  Supine:  Clams alternating Black TB with TA x 20 (small ROM);   SLR 2 x 10 bil;  TA with; Supine march x 20 with Black TB and TA;  S/L hip abd x15 on R,  (Pillow btwn legs)  Seated:[name]  Standing:  Stretches: LTR x 15;  Piriformis 30 sec x 3 on R modified fig 4,  Neuromuscular Re-education: Manual Therapy: DTM/TPR to R glute    08/31/22: Therapeutic Exercise: Aerobic:   Supine:  Clams alternating Black TB with TA x 20 (small ROM);   SLR 2 x 10 bil;  Hip add squeeze with TA x 15, 5 sec holds;  S/L hip abd x15 bil (Pillow btwn legs)  Seated:[name]  Standing:  Stretches: LTR x 15;  Piriformis 30 sec x 3 on R modified fig 4,  Neuromuscular Re-education: Manual Therapy: DTM/TPR to R glute    08/24/22: Therapeutic Exercise: Aerobic:  Supine:   Hip abd Black TB with TA x 20 (small ROM);   SLR 2 x 10 bil;   S/L hip abd x15 bil (Pillow btwn legs)  Seated: Sit to stand x 10, no UE support , mat table.  Standing: Hip abd 2x10  bil;  Stretches: LTR x 15;  Piriformis 30 sec x 3 on R modified fig 4,  Neuromuscular Re-education: Manual Therapy:    PATIENT EDUCATION:  Education details: reviewed HEP Person educated: Patient Education method: Explanation, Demonstration, Tactile cues, Verbal cues, and Handouts Education comprehension: verbalized understanding, returned demonstration, verbal cues required, tactile cues required, and needs further education   HOME EXERCISE PROGRAM: Access Code: [email or web address]    ASSESSMENT:  CLINICAL IMPRESSION: 09/07/2022 Pt with improving ability and tolerance for ther ex. Continued manual for R glute muscle tension and trial for dry needling today. Pt with good tolerance. Will benefit from progressive strength as tolerated and continued manual as needed.   Eval:Patient presents with primary complaint of increased pain in pubic region, pain consistent with OA and osteitis pubis. She has had ongoing pain , with increased pain in standing and with walking. She is overall somewhat deconditioned, due to use of walker and ongoing pain for a few years, and has low tolerance for standing and walking activity. She has decreased strength and stability in hips, and lack of consistent HEP for her dx. Pt with decreased ability for full functional activities due to pain and deficit and will benefit from skilled PT to improve.    OBJECTIVE  IMPAIRMENTS Abnormal gait, decreased activity tolerance, decreased balance, decreased endurance, decreased knowledge of use of DME, decreased mobility, difficulty walking, decreased ROM, decreased strength, increased muscle spasms, improper body mechanics, and pain.   ACTIVITY LIMITATIONS lifting, bending, standing, squatting, stairs, and transfers  PARTICIPATION LIMITATIONS: meal prep, cleaning, laundry, driving, shopping, community activity, and yard work  PERSONAL FACTORS Time since onset of injury/illness/exacerbation are also affecting patient's functional outcome.   REHAB POTENTIAL: Good  CLINICAL DECISION MAKING: Stable/uncomplicated  EVALUATION COMPLEXITY: Low   GOALS: Goals reviewed with patient? Yes  SHORT TERM GOALS: Target date: 08/22/2022   Pt to be independent with initial HEP  Goal status: INITIAL     LONG TERM GOALS: Target date: 09/19/2022   Pt to be independent with final HEP :  Goal status: INITIAL  2.  Pt to report decreased pain in pubic symphysis to 0-3/10 with standing activity for at least 30 min.   Goal status: INITIAL  3.  Pt to report decreased use of RW, with ability for walking in her house, without RW, 50 % of the time, or as pain allows.   Goal status: INITIAL  4.  Pt to demo improved strength of bil hips to at least 4+/5 to improve stability and pain.   Goal status: INITIAL    PLAN: PT FREQUENCY: 1-2x/week  PT DURATION: 6 weeks  PLANNED INTERVENTIONS: Therapeutic exercises, Therapeutic activity, Neuromuscular re-education, Balance training, Gait training, Patient/Family education, Self Care, Joint mobilization, Joint manipulation, Stair training, DME instructions, Aquatic Therapy, Dry Needling, Electrical stimulation, Spinal manipulation, Spinal mobilization, Cryotherapy, Moist heat, Taping, Vasopneumatic device, Traction, Ultrasound, Ionotophoresis '4mg'$ /ml Dexamethasone, and Manual therapy  PLAN FOR NEXT SESSION: progress standing  strength, continue manual and dn as needed.    Lyndee Hensen, PT, DPT 4:02 PM  09/07/22

## 2022-09-14 ENCOUNTER — Ambulatory Visit: Payer: Medicare HMO | Admitting: Physical Therapy

## 2022-09-14 ENCOUNTER — Encounter: Payer: Self-pay | Admitting: Physical Therapy

## 2022-09-14 ENCOUNTER — Encounter: Payer: Medicare HMO | Admitting: Physical Therapy

## 2022-09-14 DIAGNOSIS — M6281 Muscle weakness (generalized): Secondary | ICD-10-CM | POA: Diagnosis not present

## 2022-09-14 DIAGNOSIS — M5459 Other low back pain: Secondary | ICD-10-CM

## 2022-09-14 DIAGNOSIS — M869 Osteomyelitis, unspecified: Secondary | ICD-10-CM | POA: Diagnosis not present

## 2022-09-14 NOTE — Therapy (Signed)
OUTPATIENT PHYSICAL THERAPY LOWER EXTREMITY TREATMENT   Patient Name: Ranesha Val MRN: [id number] DOB:[birth year], 76 y.o., female Today's Date: 09/14/2022   PT End of Session - 09/14/22 1344     Visit Number 6    Number of Visits 12    Date for PT Re-Evaluation 09/19/22    Authorization Type [employer]    PT Start Time 1220    PT Stop Time 1300    PT Time Calculation (min) 40 min    Activity Tolerance Patient tolerated treatment well    Behavior During Therapy WFL for tasks assessed/performed                 Past Medical History:  Diagnosis Date   Anxiety    years ago - no longer an issue   Arthritis    gout   Cataracts, bilateral    Complication of anesthesia    Fracture of pelvis (HCC)    GERD (gastroesophageal reflux disease)    Hx of chest pain    "anxiety"   Hyperlipidemia    INSOMNIA 10/14/2008   in past   Joint pain    Kidney problem    Low blood sugar    eats several smaller meals a day   Obesity    Palpitations    Stress related    Proteinuria    H/O   Tobacco abuse    quit smoking 08/2013   Past Surgical History:  Procedure Laterality Date   ABDOMINAL HYSTERECTOMY     APPENDECTOMY     with hysterectomy   childbirth     x4   CHOLECYSTECTOMY  2001   EYE SURGERY Bilateral 2015   cataract surgery with lens implant   NEPHRECTOMY  1992   complex cystic mass - nephrectomy right   ORIF ANKLE FRACTURE Right 07/02/2014   Procedure: OPEN REDUCTION INTERNAL FIXATION (ORIF) RIGHT ANKLE BIMALLOELAR FRACTURE;  Surgeon: Wylene Simmer, MD;  Location: [facility];  Service: Orthopedics;  Laterality: Right;   OTHER SURGICAL HISTORY     hysterectomy, R ovary remains   SHOULDER SURGERY     bone spurs left   Patient Active Problem List   Diagnosis Date Noted   Fatty liver 06/12/2022   Snoring 01/06/2020   CKD (chronic kidney disease), stage III ([place]) 02/14/2019   Osteoarthritis of spine with radiculopathy, cervical region 02/12/2018    Intertrigo 01/10/2018   History of osteitis pubis 12/19/2017   Prediabetes 10/22/2017   Hyperglycemia 09/18/2017   Atypical chest pain 03/26/2017   Depression 12/19/2016   Eczema 06/29/2015   Gout 03/12/2015   S/p nephrectomy 03/12/2015   Hyperlipidemia 03/12/2015   Tinnitus 03/12/2015   COPD (chronic obstructive pulmonary disease) ([place]) 03/12/2015   History of colonic polyps 03/12/2015   Bimalleolar fracture 07/02/2014   Arthritis    Former smoker    Anxiety state 12/09/2009   Vitamin D deficiency 05/28/2009   Hematuria, unspecified 05/26/2009   Contact dermatitis and other eczema, due to unspecified cause 05/26/2009   Arthropathy 05/26/2009   Insomnia, unspecified 04/27/2009   DDD (degenerative disc disease), lumbosacral 01/20/2009   Hearing loss 01/20/2009   Sialoadenitis 01/20/2009   GERD 01/23/2008   MICROALBUMINURIA 01/23/2008   Microscopic hematuria 01/23/2008   UTI (urinary tract infection) 01/08/2008    PCP: Garret Reddish  REFERRING PROVIDER: Garret Reddish  REFERRING DIAG: Osteitis Pubis  THERAPY DIAG:  Osteitis pubis ([place])  Other low back pain  Muscle weakness (generalized)  Rationale for Evaluation and Treatment Rehabilitation  ONSET DATE:   SUBJECTIVE:   SUBJECTIVE STATEMENT Pt  doing well, much less pain this week. Did very well with dry needling    Eval:Pt has had ongoing pain for years, seen previously in 2019 for same pain. She is Using RW when walking longer distance. Feels legs get tired with standing/walking. States no pain yesterday for some reason.  Also reports Pain in bil lateral sides/torso at times  Did have R sciatic pain Main/ongoing pain is in pubic symphysis region. States sharp pain with weight bearing, has been limiting her mobility in the last few years.   PERTINENT HISTORY:   PAIN:  Are you having pain? Yes: NPRS scale: 0-10/10 Pain location: pubic Pain description: sore, variable  Aggravating factors: standing,  walking.  Relieving factors: sitting.   PRECAUTIONS: None  WEIGHT BEARING RESTRICTIONS No  FALLS:  Has patient fallen in last 6 months? No  LIVING ENVIRONMENT: Lives with: lives alone Lives in: House/apartment Stairs: Yes: Internal: no steps; none and External: 3 steps; on right going up and on left going up Has following equipment at home: Walker - 2 wheeled  PLOF: [place]  decreased pain in groin, increased LE strength, walking     OBJECTIVE:   DIAGNOSTIC FINDINGS:  DG/ 2020  No recent imaging of pelvis.  FINDINGS: Hip and SI joint spaces preserved.   Osseous mineralization normal.   Narrowing, spurring, and sclerosis at pubic symphysis consistent with history of osteitis pubis.   No acute fracture, dislocation, or bone destruction.   Facet degenerative changes at visualized lower lumbar spine.  COGNITION:  Overall cognitive status: Within functional limits for tasks assessed     SENSATION: WFL   PALPATION:   LOWER EXTREMITY ROM:  Lumbar ROM: Mild/mod limitation for flexion, ext, SB/WFL,   Hip ROM: WFL, mild limitation for rotation- due to pain in groin.    LOWER EXTREMITY MMT:  Hips: 4/5,  Knees: 5/5   LOWER EXTREMITY SPECIAL TESTS:    GAIT: Distance walked: 100 ft Assistive device utilized: Walker - 2 wheeled Level of assistance: Modified independence Comments: slower speed, slight fwd trunk flexion.     TODAY'S TREATMENT:  09/14/22: Therapeutic Exercise: Aerobic:  Supine:  Bridging x 15; Clams alternating Black TB with TA x 20 (small ROM);   SLR 2 x 10 bil;  TA with; Supine march x 20 with Black TB and TA;  S/L hip abd x15 on R,  (Pillow btwn legs) ;  Seated: Thoracic rotation x 10; seated QL stretch 10 sec x 3 bil;  Standing:  Stretches: LTR x 15;  Piriformis 30 sec x 3 on R modified fig 4,  Neuromuscular Re-education: Manual Therapy: DTM/TPR to R glute Trigger Point Dry-Needling  Treatment instructions: Expect  mild to moderate muscle soreness. S/S of pneumothorax if dry needled over a lung field, and to seek immediate medical attention should they occur. Patient verbalized understanding of these instructions and education.  Patient Consent Given: Yes Education handout provided: Yes Muscles treated: R glute med, max Electrical stimulation performed: No Parameters: N/A Treatment response/outcome: palpable increase in muscle length.      09/07/22: Therapeutic Exercise: Aerobic:  Supine:  Clams alternating Black TB with TA x 20 (small ROM);   SLR 2 x 10 bil;  TA with; Supine march x 20 with Black TB and TA;  S/L hip abd x15 on R,  (Pillow btwn legs)  Seated:[name]  Standing:  Stretches: LTR x 15;  Piriformis 30 sec x 3  on R modified fig 4,  Neuromuscular Re-education: Manual Therapy: DTM/TPR to R glute    08/31/22: Therapeutic Exercise: Aerobic:  Supine:  Clams alternating Black TB with TA x 20 (small ROM);   SLR 2 x 10 bil;  Hip add squeeze with TA x 15, 5 sec holds;  S/L hip abd x15 bil (Pillow btwn legs)  Seated:[name]  Standing:  Stretches: LTR x 15;  Piriformis 30 sec x 3 on R modified fig 4,  Neuromuscular Re-education: Manual Therapy: DTM/TPR to R glute   PATIENT EDUCATION:  Education details: reviewed HEP Person educated: Patient Education method: Explanation, Demonstration, Tactile cues, Verbal cues, and Handouts Education comprehension: verbalized understanding, returned demonstration, verbal cues required, tactile cues required, and needs further education   HOME EXERCISE PROGRAM: Access Code: [email or web address]    ASSESSMENT:  CLINICAL IMPRESSION: 09/14/2022 Pt with improving pain, and ability for functional activity. She did very well with dry needling last visit. Still having muscle tension in R glute, repeated DN today. Pt with improving ability for ther ex, still very challenged with R hip strength, due to weakness.   Eval:Patient presents with primary complaint of increased pain  in pubic region, pain consistent with OA and osteitis pubis. She has had ongoing pain , with increased pain in standing and with walking. She is overall somewhat deconditioned, due to use of walker and ongoing pain for a few years, and has low tolerance for standing and walking activity. She has decreased strength and stability in hips, and lack of consistent HEP for her dx. Pt with decreased ability for full functional activities due to pain and deficit and will benefit from skilled PT to improve.    OBJECTIVE IMPAIRMENTS Abnormal gait, decreased activity tolerance, decreased balance, decreased endurance, decreased knowledge of use of DME, decreased mobility, difficulty walking, decreased ROM, decreased strength, increased muscle spasms, improper body mechanics, and pain.   ACTIVITY LIMITATIONS lifting, bending, standing, squatting, stairs, and transfers  PARTICIPATION LIMITATIONS: meal prep, cleaning, laundry, driving, shopping, community activity, and yard work  PERSONAL FACTORS Time since onset of injury/illness/exacerbation are also affecting patient's functional outcome.   REHAB POTENTIAL: Good  CLINICAL DECISION MAKING: Stable/uncomplicated  EVALUATION COMPLEXITY: Low   GOALS: Goals reviewed with patient? Yes  SHORT TERM GOALS: Target date: 08/22/2022   Pt to be independent with initial HEP  Goal status: INITIAL     LONG TERM GOALS: Target date: 09/19/2022   Pt to be independent with final HEP :  Goal status: INITIAL  2.  Pt to report decreased pain in pubic symphysis to 0-3/10 with standing activity for at least 30 min.   Goal status: INITIAL  3.  Pt to report decreased use of RW, with ability for walking in her house, without RW, 50 % of the time, or as pain allows.   Goal status: INITIAL  4.  Pt to demo improved strength of bil hips to at least 4+/5 to improve stability and pain.   Goal status: INITIAL    PLAN: PT FREQUENCY: 1-2x/week  PT DURATION: 6  weeks  PLANNED INTERVENTIONS: Therapeutic exercises, Therapeutic activity, Neuromuscular re-education, Balance training, Gait training, Patient/Family education, Self Care, Joint mobilization, Joint manipulation, Stair training, DME instructions, Aquatic Therapy, Dry Needling, Electrical stimulation, Spinal manipulation, Spinal mobilization, Cryotherapy, Moist heat, Taping, Vasopneumatic device, Traction, Ultrasound, Ionotophoresis '4mg'$ /ml Dexamethasone, and Manual therapy  PLAN FOR NEXT SESSION: progress standing strength, continue manual and dn as needed.    Lyndee Hensen, PT, DPT 1:49 PM  09/14/22

## 2022-09-16 ENCOUNTER — Encounter: Payer: Self-pay | Admitting: Family Medicine

## 2022-09-18 ENCOUNTER — Other Ambulatory Visit: Payer: Self-pay

## 2022-09-18 MED ORDER — ONDANSETRON 4 MG PO TBDP: 4.0000 mg | ORAL_TABLET | Freq: Three times a day (TID) | ORAL | 1 refills | Status: DC | PRN

## 2022-09-20 ENCOUNTER — Ambulatory Visit (AMBULATORY_SURGERY_CENTER): Payer: Self-pay

## 2022-09-20 VITALS — Ht 68.0 in | Wt 263.0 lb

## 2022-09-20 DIAGNOSIS — Z86010 Personal history of colonic polyps: Secondary | ICD-10-CM

## 2022-09-20 MED ORDER — NA SULFATE-K SULFATE-MG SULF 17.5-3.13-1.6 GM/177ML PO SOLN: 1.0000 | ORAL | 0 refills | Status: DC

## 2022-09-20 NOTE — Progress Notes (Signed)
No egg or soy allergy known to patient  No issues known to pt with past sedation with any surgeries or procedures Patient denies ever being told they had issues or difficulty with intubation  No FH of Malignant Hyperthermia Pt is not on diet pills Pt is not on  home 02  Pt is not on blood thinners  Pt denies issues with constipation  No A fib or A flutter Have any cardiac testing pending--denied Pt instructed to use [email or web address] or GoodRx for a price reduction on prep   

## 2022-09-21 ENCOUNTER — Encounter: Payer: Self-pay | Admitting: Physical Therapy

## 2022-09-21 ENCOUNTER — Ambulatory Visit: Payer: Medicare HMO | Admitting: Physical Therapy

## 2022-09-21 DIAGNOSIS — M6281 Muscle weakness (generalized): Secondary | ICD-10-CM

## 2022-09-21 DIAGNOSIS — M5459 Other low back pain: Secondary | ICD-10-CM

## 2022-09-21 DIAGNOSIS — M869 Osteomyelitis, unspecified: Secondary | ICD-10-CM

## 2022-09-21 NOTE — Therapy (Signed)
OUTPATIENT PHYSICAL THERAPY LOWER EXTREMITY TREATMENT/Re-Cert   Patient Name: Isys Tietje MRN: [id number] DOB:May 25, 1946, 76 y.o., female Today's Date: 09/21/2022   PT End of Session - 09/21/22 1217     Visit Number 7    Number of Visits 20    Date for PT Re-Evaluation 11/02/22    Authorization Type [employer] Medicare    PT Start Time 1215    PT Stop Time 1257    PT Time Calculation (min) 42 min    Activity Tolerance Patient tolerated treatment well    Behavior During Therapy WFL for tasks assessed/performed                  Past Medical History:  Diagnosis Date   Anxiety    years ago - no longer an issue   Arthritis    gout   Cataracts, bilateral    Complication of anesthesia    Fracture of pelvis (HCC)    GERD (gastroesophageal reflux disease)    Hx of chest pain    "anxiety"   Hyperlipidemia    INSOMNIA 10/14/2008   in past   Joint pain    Kidney problem    Low blood sugar    eats several smaller meals a day   Obesity    Palpitations    Stress related    Proteinuria    H/O   Tobacco abuse    quit smoking 08/2013   Past Surgical History:  Procedure Laterality Date   ABDOMINAL HYSTERECTOMY     APPENDECTOMY     with hysterectomy   childbirth     x4   CHOLECYSTECTOMY  2001   EYE SURGERY Bilateral 2015   cataract surgery with lens implant   NEPHRECTOMY  1992   complex cystic mass - nephrectomy right   ORIF ANKLE FRACTURE Right 07/02/2014   Procedure: OPEN REDUCTION INTERNAL FIXATION (ORIF) RIGHT ANKLE BIMALLOELAR FRACTURE;  Surgeon: Wylene Simmer, MD;  Location: [facility];  Service: Orthopedics;  Laterality: Right;   OTHER SURGICAL HISTORY     hysterectomy, R ovary remains   SHOULDER SURGERY     bone spurs left   Patient Active Problem List   Diagnosis Date Noted   Fatty liver 06/12/2022   Snoring 01/06/2020   CKD (chronic kidney disease), stage III ([place]) 02/14/2019   Osteoarthritis of spine with radiculopathy, cervical region  02/12/2018   Intertrigo 01/10/2018   History of osteitis pubis 12/19/2017   Prediabetes 10/22/2017   Hyperglycemia 09/18/2017   Atypical chest pain 03/26/2017   Depression 12/19/2016   Eczema 06/29/2015   Gout 03/12/2015   S/p nephrectomy 03/12/2015   Hyperlipidemia 03/12/2015   Tinnitus 03/12/2015   COPD (chronic obstructive pulmonary disease) ([place]) 03/12/2015   History of colonic polyps 03/12/2015   Bimalleolar fracture 07/02/2014   Arthritis    Former smoker    Anxiety state 12/09/2009   Vitamin D deficiency 05/28/2009   Hematuria, unspecified 05/26/2009   Contact dermatitis and other eczema, due to unspecified cause 05/26/2009   Arthropathy 05/26/2009   Insomnia, unspecified 04/27/2009   DDD (degenerative disc disease), lumbosacral 01/20/2009   Hearing loss 01/20/2009   Sialoadenitis 01/20/2009   GERD 01/23/2008   MICROALBUMINURIA 01/23/2008   Microscopic hematuria 01/23/2008   UTI (urinary tract infection) 01/08/2008    PCP: Garret Reddish  REFERRING PROVIDER: Garret Reddish  REFERRING DIAG: Osteitis Pubis  THERAPY DIAG:  Osteitis pubis ([place])  Other low back pain  Muscle weakness (generalized)  Rationale for Evaluation and Treatment  Rehabilitation  ONSET DATE:   SUBJECTIVE:   SUBJECTIVE STATEMENT Pt  doing well, mild pain in L pubic region, and less pain in R hip.      Eval:Pt has had ongoing pain for years, seen previously in 2019 for same pain. She is Using RW when walking longer distance. Feels legs get tired with standing/walking. States no pain yesterday for some reason.  Also reports Pain in bil lateral sides/torso at times  Did have R sciatic pain Main/ongoing pain is in pubic symphysis region. States sharp pain with weight bearing, has been limiting her mobility in the last few years.   PERTINENT HISTORY:   PAIN:  Are you having pain? Yes: NPRS scale: 0-6/10 Pain location: pubic Pain description: sore, variable  Aggravating factors:  standing, walking.  Relieving factors: sitting.   Are you having pain? Yes: NPRS scale: 0-4 /10 Pain location: R hip/glute Pain description: sore, variable  Aggravating factors: standing, walking.  Relieving factors: sitting.    PRECAUTIONS: None  WEIGHT BEARING RESTRICTIONS No  FALLS:  Has patient fallen in last 6 months? No  LIVING ENVIRONMENT: Lives with: lives alone Lives in: House/apartment Stairs: Yes: Internal: no steps; none and External: 3 steps; on right going up and on left going up Has following equipment at home: Walker - 2 wheeled  PLOF: [place]  decreased pain in groin, increased LE strength, walking     OBJECTIVE: updated 10/19  DIAGNOSTIC FINDINGS:  DG/ 2020  No recent imaging of pelvis.  FINDINGS: Hip and SI joint spaces preserved.   Osseous mineralization normal.   Narrowing, spurring, and sclerosis at pubic symphysis consistent with history of osteitis pubis.   No acute fracture, dislocation, or bone destruction.   Facet degenerative changes at visualized lower lumbar spine.  COGNITION:  Overall cognitive status: Within functional limits for tasks assessed     SENSATION: WFL   PALPATION: Tenderness/tightness (improving) in R glute and piriformis    LOWER EXTREMITY ROM:  Lumbar ROM: Mild/mod limitation for flexion, ext, SB/WFL,   Hip ROM: WFL,    LOWER EXTREMITY MMT:  Hips: L: 4+/5,  R: 4/5 ,abd 4-/5     Knees: 5/5    LOWER EXTREMITY SPECIAL TESTS:    GAIT: Distance walked: 100 ft Assistive device utilized: None Level of assistance: Modified independence Comments:       TODAY'S TREATMENT:  09/21/22: Therapeutic Exercise: Aerobic:  Supine:  Bridging x 15; Clams alternating Black TB with TA x 20 (small ROM);   SLR 2 x 10 bil;  TA with; Supine march x 20 with Black TB and TA;  S/L hip abd 3 x 5  bil; (Pillow btwn legs) ;  Seated: Thoracic rotation x 10;  Standing: March x 20;  Step ups 4 in x10  bil, stairs up /down 5 steps reciprocal x 5 with 1 UE support;  Stretches: LTR x 15;  Piriformis 30 sec x 3 on R modified fig 4,  Neuromuscular Re-education: Manual Therapy:     09/14/22: Therapeutic Exercise: Aerobic:  Supine:  Bridging x 15; Clams alternating Black TB with TA x 20 (small ROM);   SLR 2 x 10 bil;  TA with; Supine march x 20 with Black TB and TA;  S/L hip abd x15 on R,  (Pillow btwn legs) ;  Seated: Thoracic rotation x 10; seated QL stretch 10 sec x 3 bil;  Standing:  Stretches: LTR x 15;  Piriformis 30 sec x 3 on R modified fig  4,  Neuromuscular Re-education: Manual Therapy: DTM/TPR to R glute Trigger Point Dry-Needling  Treatment instructions: Expect mild to moderate muscle soreness. S/S of pneumothorax if dry needled over a lung field, and to seek immediate medical attention should they occur. Patient verbalized understanding of these instructions and education.  Patient Consent Given: Yes Education handout provided: Yes Muscles treated: R glute med, max Electrical stimulation performed: No Parameters: N/A Treatment response/outcome: palpable increase in muscle length.      PATIENT EDUCATION:  Education details: reviewed HEP Person educated: Patient Education method: Explanation, Demonstration, Tactile cues, Verbal cues, and Handouts Education comprehension: verbalized understanding, returned demonstration, verbal cues required, tactile cues required, and needs further education   HOME EXERCISE PROGRAM: Access Code: [email or web address]    ASSESSMENT:  CLINICAL IMPRESSION:  09/21/2022 Pt has been seen for 7 visits. She has had improvements in pain in pubic region as well as R glute. She has not used the walker in a few weeks due to decreased pain. She is doing well with HEP. Still has noted weakness in Hips, and pain in R low back and glute. Manual and dry needling have been helpful to decrease tightness. Pt progressing well, and will benefit from continuation of  skilled PT to improve core and hip strength, hip/glute pain, improve endurance for ambulation and to meet LTGs.   Eval:Patient presents with primary complaint of increased pain in pubic region, pain consistent with OA and osteitis pubis. She has had ongoing pain , with increased pain in standing and with walking. She is overall somewhat deconditioned, due to use of walker and ongoing pain for a few years, and has low tolerance for standing and walking activity. She has decreased strength and stability in hips, and lack of consistent HEP for her dx. Pt with decreased ability for full functional activities due to pain and deficit and will benefit from skilled PT to improve.    OBJECTIVE IMPAIRMENTS Abnormal gait, decreased activity tolerance, decreased balance, decreased endurance, decreased knowledge of use of DME, decreased mobility, difficulty walking, decreased ROM, decreased strength, increased muscle spasms, improper body mechanics, and pain.   ACTIVITY LIMITATIONS lifting, bending, standing, squatting, stairs, and transfers  PARTICIPATION LIMITATIONS: meal prep, cleaning, laundry, driving, shopping, community activity, and yard work  PERSONAL FACTORS Time since onset of injury/illness/exacerbation are also affecting patient's functional outcome.   REHAB POTENTIAL: Good  CLINICAL DECISION MAKING: Stable/uncomplicated  EVALUATION COMPLEXITY: Low   GOALS: Goals reviewed with patient? Yes  SHORT TERM GOALS: Target date: 08/22/2022   Pt to be independent with initial HEP  Goal status: MET     LONG TERM GOALS: Target date: 11/02/2022   Pt to be independent with final HEP :  Goal status: MET  2.  Pt to report decreased pain in pubic symphysis to 0-3/10 with standing activity for at least 30 min.   Goal status: IN PROGRESS  3.  Pt to report decreased use of RW, with ability for walking in her house, without RW, 50 % of the time, or as pain allows.   Goal status: MET  4.  Pt  to demo improved strength of bil hips to at least 4+/5 to improve stability and pain.   Goal status: IN PROGRESS    PLAN: PT FREQUENCY: 1-2x/week  PT DURATION: 6 weeks  PLANNED INTERVENTIONS: Therapeutic exercises, Therapeutic activity, Neuromuscular re-education, Balance training, Gait training, Patient/Family education, Self Care, Joint mobilization, Joint manipulation, Stair training, DME instructions, Aquatic Therapy, Dry Needling, Electrical stimulation, Spinal manipulation, Spinal mobilization,  Cryotherapy, Moist heat, Taping, Vasopneumatic device, Traction, Ultrasound, Ionotophoresis 4mg /ml Dexamethasone, and Manual therapy  PLAN FOR NEXT SESSION: progress standing strength, continue manual and dn as needed.    Lyndee Hensen, PT, DPT 3:44 PM  09/21/22

## 2022-09-28 ENCOUNTER — Encounter: Payer: Self-pay | Admitting: Physical Therapy

## 2022-09-28 ENCOUNTER — Ambulatory Visit: Payer: Medicare HMO | Admitting: Physical Therapy

## 2022-09-28 DIAGNOSIS — M5459 Other low back pain: Secondary | ICD-10-CM

## 2022-09-28 DIAGNOSIS — M869 Osteomyelitis, unspecified: Secondary | ICD-10-CM

## 2022-09-28 DIAGNOSIS — M6281 Muscle weakness (generalized): Secondary | ICD-10-CM | POA: Diagnosis not present

## 2022-09-28 DIAGNOSIS — M898X8 Other specified disorders of bone, other site: Secondary | ICD-10-CM

## 2022-09-28 NOTE — Therapy (Signed)
OUTPATIENT PHYSICAL THERAPY LOWER EXTREMITY TREATMENT   Patient Name: Angie Velasquez MRN: [id number] DOB:[birth year], 76 y.o., female Today's Date: 09/28/2022   PT End of Session - 09/28/22 1210     Visit Number 8    Number of Visits 20    Date for PT Re-Evaluation 11/02/22    Authorization Type [employer] Medicare, re-cert done at visit 7    PT Start Time 1215    PT Stop Time 1257    PT Time Calculation (min) 42 min    Activity Tolerance Patient tolerated treatment well    Behavior During Therapy WFL for tasks assessed/performed                  Past Medical History:  Diagnosis Date   Anxiety    years ago - no longer an issue   Arthritis    gout   Cataracts, bilateral    Complication of anesthesia    Fracture of pelvis (HCC)    GERD (gastroesophageal reflux disease)    Hx of chest pain    "anxiety"   Hyperlipidemia    INSOMNIA 10/14/2008   in past   Joint pain    Kidney problem    Low blood sugar    eats several smaller meals a day   Obesity    Palpitations    Stress related    Proteinuria    H/O   Tobacco abuse    quit smoking 08/2013   Past Surgical History:  Procedure Laterality Date   ABDOMINAL HYSTERECTOMY     APPENDECTOMY     with hysterectomy   childbirth     x4   CHOLECYSTECTOMY  2001   EYE SURGERY Bilateral 2015   cataract surgery with lens implant   NEPHRECTOMY  1992   complex cystic mass - nephrectomy right   ORIF ANKLE FRACTURE Right 07/02/2014   Procedure: OPEN REDUCTION INTERNAL FIXATION (ORIF) RIGHT ANKLE BIMALLOELAR FRACTURE;  Surgeon: Wylene Simmer, MD;  Location: [place];  Service: Orthopedics;  Laterality: Right;   OTHER SURGICAL HISTORY     hysterectomy, R ovary remains   SHOULDER SURGERY     bone spurs left   Patient Active Problem List   Diagnosis Date Noted   Fatty liver 06/12/2022   Snoring 01/06/2020   CKD (chronic kidney disease), stage III ([place]) 02/14/2019   Osteoarthritis of spine with radiculopathy,  cervical region 02/12/2018   Intertrigo 01/10/2018   History of osteitis pubis 12/19/2017   Prediabetes 10/22/2017   Hyperglycemia 09/18/2017   Atypical chest pain 03/26/2017   Depression 12/19/2016   Eczema 06/29/2015   Gout 03/12/2015   S/p nephrectomy 03/12/2015   Hyperlipidemia 03/12/2015   Tinnitus 03/12/2015   COPD (chronic obstructive pulmonary disease) ([place]) 03/12/2015   History of colonic polyps 03/12/2015   Bimalleolar fracture 07/02/2014   Arthritis    Former smoker    Anxiety state 12/09/2009   Vitamin D deficiency 05/28/2009   Hematuria, unspecified 05/26/2009   Contact dermatitis and other eczema, due to unspecified cause 05/26/2009   Arthropathy 05/26/2009   Insomnia, unspecified 04/27/2009   DDD (degenerative disc disease), lumbosacral 01/20/2009   Hearing loss 01/20/2009   Sialoadenitis 01/20/2009   GERD 01/23/2008   MICROALBUMINURIA 01/23/2008   Microscopic hematuria 01/23/2008   UTI (urinary tract infection) 01/08/2008    PCP: Garret Reddish  REFERRING PROVIDER: Garret Reddish  REFERRING DIAG: Osteitis Pubis  THERAPY DIAG:  Osteitis pubis ([place])  Other low back pain  Muscle weakness (generalized)  Rationale for Evaluation and Treatment Rehabilitation  ONSET DATE:   SUBJECTIVE:   SUBJECTIVE STATEMENT Pt has variable pain in groin, for a few days this week. Still is taking less pain meds, and not using walker very often at all.     Eval:Pt has had ongoing pain for years, seen previously in 2019 for same pain. She is Using RW when walking longer distance. Feels legs get tired with standing/walking. States no pain yesterday for some reason.  Also reports Pain in bil lateral sides/torso at times  Did have R sciatic pain Main/ongoing pain is in pubic symphysis region. States sharp pain with weight bearing, has been limiting her mobility in the last few years.   PERTINENT HISTORY:   PAIN:  Are you having pain? Yes: NPRS scale: 0-6/10 Pain  location: pubic Pain description: sore, variable  Aggravating factors: standing, walking.  Relieving factors: sitting.   Are you having pain? Yes: NPRS scale: 0-4 /10 Pain location: R hip/glute Pain description: sore, variable  Aggravating factors: standing, walking.  Relieving factors: sitting.    PRECAUTIONS: None  WEIGHT BEARING RESTRICTIONS No  FALLS:  Has patient fallen in last 6 months? No  LIVING ENVIRONMENT: Lives with: lives alone Lives in: House/apartment Stairs: Yes: Internal: no steps; none and External: 3 steps; on right going up and on left going up Has following equipment at home: Walker - 2 wheeled  PLOF: [place]  decreased pain in groin, increased LE strength, walking     OBJECTIVE: updated 10/19  DIAGNOSTIC FINDINGS:  DG/ 2020  No recent imaging of pelvis.  FINDINGS: Hip and SI joint spaces preserved.   Osseous mineralization normal.   Narrowing, spurring, and sclerosis at pubic symphysis consistent with history of osteitis pubis.   No acute fracture, dislocation, or bone destruction.   Facet degenerative changes at visualized lower lumbar spine.  COGNITION:  Overall cognitive status: Within functional limits for tasks assessed     SENSATION: WFL   PALPATION: Tenderness/tightness (improving) in R glute and piriformis    LOWER EXTREMITY ROM:  Lumbar ROM: Mild/mod limitation for flexion, ext, SB/WFL,   Hip ROM: WFL,    LOWER EXTREMITY MMT:  Hips: L: 4+/5,  R: 4/5 ,abd 4-/5     Knees: 5/5    LOWER EXTREMITY SPECIAL TESTS:    GAIT: Distance walked: 100 ft Assistive device utilized: None Level of assistance: Modified independence Comments:       TODAY'S TREATMENT:  09/28/22: Therapeutic Exercise: Aerobic:  Supine:  Bridging x 20;    ;  S/L hip abd 4 x 5  bil; (Pillow btwn legs) ;  Seated: Standing: March x 20; Hip abd 4x5 bil;  Stretches: LTR x 15;  Piriformis 30 sec x 3 bil;   Neuromuscular  Re-education: Manual Therapy: DTM/TPR to R glute Trigger Point Dry-Needling  Treatment instructions: Expect mild to moderate muscle soreness. S/S of pneumothorax if dry needled over a lung field, and to seek immediate medical attention should they occur. Patient verbalized understanding of these instructions and education.  Patient Consent Given: Yes Education handout provided: Yes Muscles treated: R glute med, min, piriformis Electrical stimulation performed: No Parameters: N/A Treatment response/outcome: palpable increase in muscle length.       09/21/22: Therapeutic Exercise: Aerobic:  Supine:  Bridging x 15; Clams alternating Black TB with TA x 20 (small ROM);   SLR 2 x 10 bil;  TA with; Supine march x 20 with Black TB and TA;  S/L hip  abd 3 x 5  bil; (Pillow btwn legs) ;  Seated: Thoracic rotation x 10;  Standing: March x 20;  Step ups 4 in x10 bil, stairs up /down 5 steps reciprocal x 5 with 1 UE support;  Stretches: LTR x 15;  Piriformis 30 sec x 3 on R modified fig 4,  Neuromuscular Re-education: Manual Therapy:     09/14/22: Therapeutic Exercise: Aerobic:  Supine:  Bridging x 15; Clams alternating Black TB with TA x 20 (small ROM);   SLR 2 x 10 bil;  TA with; Supine march x 20 with Black TB and TA;  S/L hip abd x15 on R,  (Pillow btwn legs) ;  Seated: Thoracic rotation x 10; seated QL stretch 10 sec x 3 bil;  Standing:  Stretches: LTR x 15;  Piriformis 30 sec x 3 on R modified fig 4,  Neuromuscular Re-education: Manual Therapy: DTM/TPR to R glute Trigger Point Dry-Needling  Treatment instructions: Expect mild to moderate muscle soreness. S/S of pneumothorax if dry needled over a lung field, and to seek immediate medical attention should they occur. Patient verbalized understanding of these instructions and education.  Patient Consent Given: Yes Education handout provided: Yes Muscles treated: R glute med, max Electrical stimulation performed: No Parameters:  N/A Treatment response/outcome: palpable increase in muscle length.      PATIENT EDUCATION:  Education details: reviewed HEP Person educated: Patient Education method: Explanation, Demonstration, Tactile cues, Verbal cues, and Handouts Education comprehension: verbalized understanding, returned demonstration, verbal cues required, tactile cues required, and needs further education   HOME EXERCISE PROGRAM: Access Code: [email or web address]    ASSESSMENT:  CLINICAL IMPRESSION:  09/28/2022 Pt overall having less pain. Pain in groin is variable at times. She still is challenged with hip strengthening due to weakness, and challenged in standing, due to increased pain in glute and/or pubic region with SLS phase of exercises. Manual and Dry needling done today for decreasing muscle tension in Glute. Plan to progress strength as tolerated.   Eval:Patient presents with primary complaint of increased pain in pubic region, pain consistent with OA and osteitis pubis. She has had ongoing pain , with increased pain in standing and with walking. She is overall somewhat deconditioned, due to use of walker and ongoing pain for a few years, and has low tolerance for standing and walking activity. She has decreased strength and stability in hips, and lack of consistent HEP for her dx. Pt with decreased ability for full functional activities due to pain and deficit and will benefit from skilled PT to improve.    OBJECTIVE IMPAIRMENTS Abnormal gait, decreased activity tolerance, decreased balance, decreased endurance, decreased knowledge of use of DME, decreased mobility, difficulty walking, decreased ROM, decreased strength, increased muscle spasms, improper body mechanics, and pain.   ACTIVITY LIMITATIONS lifting, bending, standing, squatting, stairs, and transfers  PARTICIPATION LIMITATIONS: meal prep, cleaning, laundry, driving, shopping, community activity, and yard work  PERSONAL FACTORS Time since onset of  injury/illness/exacerbation are also affecting patient's functional outcome.   REHAB POTENTIAL: Good  CLINICAL DECISION MAKING: Stable/uncomplicated  EVALUATION COMPLEXITY: Low   GOALS: Goals reviewed with patient? Yes  SHORT TERM GOALS: Target date: 08/22/2022   Pt to be independent with initial HEP  Goal status: MET     LONG TERM GOALS: Target date: 11/02/2022   Pt to be independent with final HEP :  Goal status: MET  2.  Pt to report decreased pain in pubic symphysis to 0-3/10 with standing activity for at least 30  min.   Goal status: IN PROGRESS  3.  Pt to report decreased use of RW, with ability for walking in her house, without RW, 50 % of the time, or as pain allows.   Goal status: MET  4.  Pt to demo improved strength of bil hips to at least 4+/5 to improve stability and pain.   Goal status: IN PROGRESS    PLAN: PT FREQUENCY: 1-2x/week  PT DURATION: 6 weeks  PLANNED INTERVENTIONS: Therapeutic exercises, Therapeutic activity, Neuromuscular re-education, Balance training, Gait training, Patient/Family education, Self Care, Joint mobilization, Joint manipulation, Stair training, DME instructions, Aquatic Therapy, Dry Needling, Electrical stimulation, Spinal manipulation, Spinal mobilization, Cryotherapy, Moist heat, Taping, Vasopneumatic device, Traction, Ultrasound, Ionotophoresis 36m/ml Dexamethasone, and Manual therapy  PLAN FOR NEXT SESSION: progress standing strength, continue manual and dn as needed.    LLyndee Hensen PT, DPT 1:00 PM  09/28/22

## 2022-10-04 NOTE — Telephone Encounter (Signed)
FYI

## 2022-10-04 NOTE — Telephone Encounter (Signed)
Patient states ov is no longer needed - patient would like to hold off until her next apt 11/13- patient stated bleeding stopped only has a little bloating now - was able to eat last night and it stayed down. Patient will call us should she need [name] before.

## 2022-10-06 ENCOUNTER — Encounter: Payer: Self-pay | Admitting: Internal Medicine

## 2022-10-11 ENCOUNTER — Encounter: Payer: Self-pay | Admitting: Physical Therapy

## 2022-10-11 ENCOUNTER — Ambulatory Visit: Payer: Medicare HMO | Admitting: Physical Therapy

## 2022-10-11 DIAGNOSIS — M5459 Other low back pain: Secondary | ICD-10-CM

## 2022-10-11 DIAGNOSIS — M6281 Muscle weakness (generalized): Secondary | ICD-10-CM

## 2022-10-11 DIAGNOSIS — M869 Osteomyelitis, unspecified: Secondary | ICD-10-CM

## 2022-10-11 NOTE — Therapy (Signed)
OUTPATIENT PHYSICAL THERAPY LOWER EXTREMITY TREATMENT   Patient Name: Angie Velasquez MRN: [id number] DOB:[birth year], 76 y.o., female Today's Date: 10/11/2022   PT End of Session - 10/11/22 1300     Visit Number 9    Number of Visits 20    Date for PT Re-Evaluation 11/02/22    Authorization Type [employer] Medicare, re-cert done at visit 7    PT Start Time 1303    PT Stop Time 1345    PT Time Calculation (min) 42 min    Activity Tolerance Patient tolerated treatment well    Behavior During Therapy WFL for tasks assessed/performed                  Past Medical History:  Diagnosis Date   Anxiety    years ago - no longer an issue   Arthritis    gout   Cataracts, bilateral    Complication of anesthesia    Fracture of pelvis (HCC)    GERD (gastroesophageal reflux disease)    Hx of chest pain    "anxiety"   Hyperlipidemia    INSOMNIA 10/14/2008   in past   Joint pain    Kidney problem    Low blood sugar    eats several smaller meals a day   Obesity    Palpitations    Stress related    Proteinuria    H/O   Tobacco abuse    quit smoking 08/2013   Past Surgical History:  Procedure Laterality Date   ABDOMINAL HYSTERECTOMY     APPENDECTOMY     with hysterectomy   childbirth     x4   CHOLECYSTECTOMY  2001   EYE SURGERY Bilateral 2015   cataract surgery with lens implant   NEPHRECTOMY  1992   complex cystic mass - nephrectomy right   ORIF ANKLE FRACTURE Right 07/02/2014   Procedure: OPEN REDUCTION INTERNAL FIXATION (ORIF) RIGHT ANKLE BIMALLOELAR FRACTURE;  Surgeon: Wylene Simmer, MD;  Location: [facility];  Service: Orthopedics;  Laterality: Right;   OTHER SURGICAL HISTORY     hysterectomy, R ovary remains   SHOULDER SURGERY     bone spurs left   Patient Active Problem List   Diagnosis Date Noted   Fatty liver 06/12/2022   Snoring 01/06/2020   CKD (chronic kidney disease), stage III ([place]) 02/14/2019   Osteoarthritis of spine with radiculopathy,  cervical region 02/12/2018   Intertrigo 01/10/2018   History of osteitis pubis 12/19/2017   Prediabetes 10/22/2017   Hyperglycemia 09/18/2017   Atypical chest pain 03/26/2017   Depression 12/19/2016   Eczema 06/29/2015   Gout 03/12/2015   S/p nephrectomy 03/12/2015   Hyperlipidemia 03/12/2015   Tinnitus 03/12/2015   COPD (chronic obstructive pulmonary disease) ([place]) 03/12/2015   History of colonic polyps 03/12/2015   Bimalleolar fracture 07/02/2014   Arthritis    Former smoker    Anxiety state 12/09/2009   Vitamin D deficiency 05/28/2009   Hematuria, unspecified 05/26/2009   Contact dermatitis and other eczema, due to unspecified cause 05/26/2009   Arthropathy 05/26/2009   Insomnia, unspecified 04/27/2009   DDD (degenerative disc disease), lumbosacral 01/20/2009   Hearing loss 01/20/2009   Sialoadenitis 01/20/2009   GERD 01/23/2008   MICROALBUMINURIA 01/23/2008   Microscopic hematuria 01/23/2008   UTI (urinary tract infection) 01/08/2008    PCP: Garret Reddish  REFERRING PROVIDER: Garret Reddish  REFERRING DIAG: Osteitis Pubis  THERAPY DIAG:  Osteitis pubis ([place])  Other low back pain  Muscle weakness (generalized)  Rationale for Evaluation and Treatment Rehabilitation  ONSET DATE:   SUBJECTIVE:   SUBJECTIVE STATEMENT Pt was sick, she thinks from [place]. Feeling better now.  Notes significantly less pain in hip/glute. Has some tightness in glute in the morning, but improves during the day. Doing well with her HEP. Would like to joint [place].     Eval:Pt has had ongoing pain for years, seen previously in 2019 for same pain. She is Using RW when walking longer distance. Feels legs get tired with standing/walking. States no pain yesterday for some reason.  Also reports Pain in bil lateral sides/torso at times  Did have R sciatic pain Main/ongoing pain is in pubic symphysis region. States sharp pain with weight bearing, has been limiting her mobility  in the last few years.   PERTINENT HISTORY:   PAIN:  Are you having pain? Yes: NPRS scale: 0-6/10 Pain location: pubic Pain description: sore, variable  Aggravating factors: standing, walking.  Relieving factors: sitting.   Are you having pain? Yes: NPRS scale: 0-4 /10 Pain location: R hip/glute Pain description: sore, variable  Aggravating factors: standing, walking.  Relieving factors: sitting.    PRECAUTIONS: None  WEIGHT BEARING RESTRICTIONS No  FALLS:  Has patient fallen in last 6 months? No  LIVING ENVIRONMENT: Lives with: lives alone Lives in: House/apartment Stairs: Yes: Internal: no steps; none and External: 3 steps; on right going up and on left going up Has following equipment at home: Walker - 2 wheeled  PLOF: [place]  decreased pain in groin, increased LE strength, walking     OBJECTIVE: updated 10/19  DIAGNOSTIC FINDINGS:  DG/ 2020  No recent imaging of pelvis.  FINDINGS: Hip and SI joint spaces preserved.   Osseous mineralization normal.   Narrowing, spurring, and sclerosis at pubic symphysis consistent with history of osteitis pubis.   No acute fracture, dislocation, or bone destruction.   Facet degenerative changes at visualized lower lumbar spine.  COGNITION:  Overall cognitive status: Within functional limits for tasks assessed     SENSATION: WFL   PALPATION: Tenderness/tightness (improving) in R glute and piriformis    LOWER EXTREMITY ROM:  Lumbar ROM: Mild/mod limitation for flexion, ext, SB/WFL,   Hip ROM: WFL,    LOWER EXTREMITY MMT:  Hips: L: 4+/5,  R: 4/5 ,abd 4-/5     Knees: 5/5    LOWER EXTREMITY SPECIAL TESTS:    GAIT: Distance walked: 100 ft Assistive device utilized: None Level of assistance: Modified independence Comments:     TODAY'S TREATMENT:  10/11/22: Therapeutic Exercise: Aerobic:  Supine:  Bridging x 20;  SLR x 10 bil;  Seated: Sit to stand x10;  Standing: March x 20; Hip  abd4x 5 bil;  Squats x 10;  Stretches: LTR x 15;  Piriformis 30 sec x 3 bil;   Neuromuscular Re-education: Manual Therapy:     PATIENT EDUCATION:  Education details: reviewed HEP Person educated: Patient Education method: Consulting civil engineer, Demonstration, Tactile cues, Verbal cues, and Handouts Education comprehension: verbalized understanding, returned demonstration, verbal cues required, tactile cues required, and needs further education   HOME EXERCISE PROGRAM: Access Code: [email or web address]    ASSESSMENT:  CLINICAL IMPRESSION:  10/11/2022 Pt has made very good progress. She is having minimal pain at this time, and is doing well managing symptoms and performing HEP. She has met goals at this time. Final HEP reviewed at this time, with focus on back and hip mobility and hip/core strength.   Eval:Patient presents with primary complaint of  increased pain in pubic region, pain consistent with OA and osteitis pubis. She has had ongoing pain , with increased pain in standing and with walking. She is overall somewhat deconditioned, due to use of walker and ongoing pain for a few years, and has low tolerance for standing and walking activity. She has decreased strength and stability in hips, and lack of consistent HEP for her dx. Pt with decreased ability for full functional activities due to pain and deficit and will benefit from skilled PT to improve.    OBJECTIVE IMPAIRMENTS Abnormal gait, decreased activity tolerance, decreased balance, decreased endurance, decreased knowledge of use of DME, decreased mobility, difficulty walking, decreased ROM, decreased strength, increased muscle spasms, improper body mechanics, and pain.   ACTIVITY LIMITATIONS lifting, bending, standing, squatting, stairs, and transfers  PARTICIPATION LIMITATIONS: meal prep, cleaning, laundry, driving, shopping, community activity, and yard work  PERSONAL FACTORS Time since onset of injury/illness/exacerbation are also affecting  patient's functional outcome.   REHAB POTENTIAL: Good  CLINICAL DECISION MAKING: Stable/uncomplicated  EVALUATION COMPLEXITY: Low   GOALS: Goals reviewed with patient? Yes  SHORT TERM GOALS: Target date: 08/22/2022   Pt to be independent with initial HEP  Goal status: MET     LONG TERM GOALS: Target date: 11/02/2022   Pt to be independent with final HEP :  Goal status: MET  2.  Pt to report decreased pain in pubic symphysis to 0-3/10 with standing activity for at least 30 min.   Goal status: MET  3.  Pt to report decreased use of RW, with ability for walking in her house, without RW, 50 % of the time, or as pain allows.   Goal status: MET  4.  Pt to demo improved strength of bil hips to at least 4+/5 to improve stability and pain.   Goal status: MET    PLAN: PT FREQUENCY: 1-2x/week  PT DURATION: 6 weeks  PLANNED INTERVENTIONS: Therapeutic exercises, Therapeutic activity, Neuromuscular re-education, Balance training, Gait training, Patient/Family education, Self Care, Joint mobilization, Joint manipulation, Stair training, DME instructions, Aquatic Therapy, Dry Needling, Electrical stimulation, Spinal manipulation, Spinal mobilization, Cryotherapy, Moist heat, Taping, Vasopneumatic device, Traction, Ultrasound, Ionotophoresis 57m/ml Dexamethasone, and Manual therapy  PLAN FOR NEXT SESSION:  progress standing strength, continue manual and dn as needed.    LLyndee Hensen PT, DPT 2:10 PM  10/11/22   PHYSICAL THERAPY DISCHARGE SUMMARY  Visits from Start of Care: 9 Plan: Patient agrees to discharge.  Patient goals were met. Patient is being discharged due to meeting the stated rehab goals.     LLyndee Hensen PT, DPT 2:39 PM  10/11/22

## 2022-10-12 ENCOUNTER — Other Ambulatory Visit: Payer: Self-pay | Admitting: Family Medicine

## 2022-10-13 ENCOUNTER — Encounter: Payer: Self-pay | Admitting: Family Medicine

## 2022-10-13 DIAGNOSIS — R7303 Prediabetes: Secondary | ICD-10-CM

## 2022-10-13 DIAGNOSIS — E559 Vitamin D deficiency, unspecified: Secondary | ICD-10-CM

## 2022-10-13 DIAGNOSIS — E785 Hyperlipidemia, unspecified: Secondary | ICD-10-CM

## 2022-10-17 ENCOUNTER — Encounter: Payer: Self-pay | Admitting: Internal Medicine

## 2022-10-17 ENCOUNTER — Ambulatory Visit (AMBULATORY_SURGERY_CENTER): Payer: Medicare HMO | Admitting: Internal Medicine

## 2022-10-17 VITALS — BP 130/69 | HR 66 | Temp 96.8°F | Resp 13 | Ht 68.0 in | Wt 263.0 lb

## 2022-10-17 DIAGNOSIS — D125 Benign neoplasm of sigmoid colon: Secondary | ICD-10-CM

## 2022-10-17 DIAGNOSIS — Z86010 Personal history of colonic polyps: Secondary | ICD-10-CM | POA: Diagnosis not present

## 2022-10-17 DIAGNOSIS — I10 Essential (primary) hypertension: Secondary | ICD-10-CM | POA: Diagnosis not present

## 2022-10-17 DIAGNOSIS — Z09 Encounter for follow-up examination after completed treatment for conditions other than malignant neoplasm: Secondary | ICD-10-CM

## 2022-10-17 MED ORDER — SODIUM CHLORIDE 0.9 % IV SOLN: 500.0000 mL | Freq: Once | INTRAVENOUS | Status: DC

## 2022-10-17 NOTE — Patient Instructions (Signed)
Handout on polyps and diverticulosis given to you today  Await pathology results from Dr. Henrene Pastor   YOU HAD AN ENDOSCOPIC PROCEDURE TODAY AT THE [facility]:   Refer to the procedure report that was given to you for any specific questions about what was found during the examination.  If the procedure report does not answer your questions, please call your gastroenterologist to clarify.  If you requested that your care partner not be given the details of your procedure findings, then the procedure report has been included in a sealed envelope for you to review at your convenience later.  YOU SHOULD EXPECT: Some feelings of bloating in the abdomen. Passage of more gas than usual.  Walking can help get rid of the air that was put into your GI tract during the procedure and reduce the bloating. If you had a lower endoscopy (such as a colonoscopy or flexible sigmoidoscopy) you may notice spotting of blood in your stool or on the toilet paper. If you underwent a bowel prep for your procedure, you may not have a normal bowel movement for a few days.  Please Note:  You might notice some irritation and congestion in your nose or some drainage.  This is from the oxygen used during your procedure.  There is no need for concern and it should clear up in a day or so.  SYMPTOMS TO REPORT IMMEDIATELY:  Following lower endoscopy (colonoscopy or flexible sigmoidoscopy):  Excessive amounts of blood in the stool  Significant tenderness or worsening of abdominal pains  Swelling of the abdomen that is new, acute  Fever of 100F or higher  For urgent or emergent issues, a gastroenterologist can be reached at any hour by calling [phone]. Do not use MyChart messaging for urgent concerns.    DIET:  We do recommend a small meal at first, but then you may proceed to your regular diet.  Drink plenty of fluids but you should avoid alcoholic beverages for 24 hours.  ACTIVITY:  You should plan to take it  easy for the rest of today and you should NOT DRIVE or use heavy machinery until tomorrow (because of the sedation medicines used during the test).    FOLLOW UP: Our staff will call the number listed on your records the next business day following your procedure.  We will call around 7:15- 8:00 am to check on you and address any questions or concerns that you may have regarding the information given to you following your procedure. If we do not reach you, we will leave a message.     If any biopsies were taken you will be contacted by phone or by letter within the next 1-3 weeks.  Please call us at [phone] if you have not heard about the biopsies in 3 weeks.    SIGNATURES/CONFIDENTIALITY: You and/or your care partner have signed paperwork which will be entered into your electronic medical record.  These signatures attest to the fact that that the information above on your After Visit Summary has been reviewed and is understood.  Full responsibility of the confidentiality of this discharge information lies with you and/or your care-partner.

## 2022-10-17 NOTE — Progress Notes (Signed)
HISTORY OF PRESENT ILLNESS:  Angie Velasquez is a 76 y.o. female with a history of multiple adenomatous colon polyps.  Presents today for surveillance colonoscopy.  No complaints  REVIEW OF SYSTEMS:  All non-GI ROS negative. Past Medical History:  Diagnosis Date   Anxiety    years ago - no longer an issue   Arthritis    gout   Cataracts, bilateral    Complication of anesthesia    Fracture of pelvis (HCC)    GERD (gastroesophageal reflux disease)    Hx of chest pain    "anxiety"   Hyperlipidemia    INSOMNIA 10/14/2008   in past   Joint pain    Kidney problem    Low blood sugar    eats several smaller meals a day   Obesity    Palpitations    Stress related    Proteinuria    H/O   Tobacco abuse    quit smoking 08/2013    Past Surgical History:  Procedure Laterality Date   ABDOMINAL HYSTERECTOMY     APPENDECTOMY     with hysterectomy   childbirth     x4   CHOLECYSTECTOMY  2001   EYE SURGERY Bilateral 2015   cataract surgery with lens implant   NEPHRECTOMY  1992   complex cystic mass - nephrectomy right   ORIF ANKLE FRACTURE Right 07/02/2014   Procedure: OPEN REDUCTION INTERNAL FIXATION (ORIF) RIGHT ANKLE BIMALLOELAR FRACTURE;  Surgeon: Wylene Simmer, MD;  Location: [facility];  Service: Orthopedics;  Laterality: Right;   OTHER SURGICAL HISTORY     hysterectomy, R ovary remains   SHOULDER SURGERY     bone spurs left    Social History Angie Velasquez  reports that she quit smoking about 9 years ago. Her smoking use included cigarettes. She has a 35.00 pack-year smoking history. She has never used smokeless tobacco. She reports current alcohol use of about 1.0 standard drink of alcohol per week. She reports that she does not use drugs.  family history includes Alzheimer's disease in her father; Arthritis in her mother; Colon cancer in her paternal grandmother; Hypertension in her father; Liver disease in her daughter; Obesity in her father and  mother; Ovarian cancer in her mother; Stroke in her father.  Allergies  Allergen Reactions   Epinephrine Palpitations   Codeine Nausea Only   Penicillins Nausea Only   Prednisone Cough       PHYSICAL EXAMINATION: Vital signs: BP (!) 146/92   Pulse 88   Temp (!) 96.8 F (36 C)   Ht '5\' 8"'$  (1.727 m)   Wt 263 lb (119.3 kg)   SpO2 96%   BMI 39.99 kg/m  General: Well-developed, well-nourished, no acute distress HEENT: Sclerae are anicteric, conjunctiva pink. Oral mucosa intact Lungs: Clear Heart: Regular Abdomen: soft, nontender, nondistended, no obvious ascites, no peritoneal signs, normal bowel sounds. No organomegaly. Extremities: No edema Psychiatric: alert and oriented x3. Cooperative     ASSESSMENT:  Personal history of multiple adenomatous colon polyps.  Last examination 2016.  Now for surveillance   PLAN:  Surveillance colonoscopy

## 2022-10-17 NOTE — Progress Notes (Signed)
Pt's states no medical or surgical changes since previsit or office visit. 

## 2022-10-17 NOTE — Progress Notes (Signed)
Report to PACU, RN, vss, BBS= Clear.  

## 2022-10-17 NOTE — Op Note (Signed)
[place] Patient Name: Angie Velasquez Procedure Date: 10/17/2022 9:49 AM MRN: [id number] Endoscopist: Docia Chuck. Henrene Pastor , MD, [id number] Age: 76 Referring MD:  Date of Birth: 1946-03-31 Gender: Female Account #: [id number] Procedure:                Colonoscopy with cold snare polypectomy x 1 Indications:              High risk colon cancer surveillance: Personal                            history of multiple (3 or more) adenomas. Previous                            examinations 2003, 2016 Medicines:                Monitored Anesthesia Care Procedure:                Pre-Anesthesia Assessment:                           - Prior to the procedure, a History and Physical                            was performed, and patient medications and                            allergies were reviewed. The patient's tolerance of                            previous anesthesia was also reviewed. The risks                            and benefits of the procedure and the sedation                            options and risks were discussed with the patient.                            All questions were answered, and informed consent                            was obtained. Prior Anticoagulants: The patient has                            taken no anticoagulant or antiplatelet agents. ASA                            Grade Assessment: II - A patient with mild systemic                            disease. After reviewing the risks and benefits,                            the patient was deemed in satisfactory condition to  undergo the procedure.                           After obtaining informed consent, the colonoscope                            was passed under direct vision. Throughout the                            procedure, the patient's blood pressure, pulse, and                            oxygen saturations were monitored continuously. The                             Colonoscope was introduced through the anus and                            advanced to the the cecum, identified by                            appendiceal orifice and ileocecal valve. The                            ileocecal valve, appendiceal orifice, and rectum                            were photographed. The quality of the bowel                            preparation was good. The colonoscopy was performed                            without difficulty. The patient tolerated the                            procedure well. The bowel preparation used was                            SUPREP via split dose instruction. Scope In: 7:49:44 AM Scope Out: 10:12:20 AM Scope Withdrawal Time: 0 hours 9 minutes 8 seconds  Total Procedure Duration: 0 hours 15 minutes 46 seconds  Findings:                 A 4 mm polyp was found in the sigmoid colon. The                            polyp was removed with a cold snare. Resection and                            retrieval were complete.                           Multiple diverticula were found in the left colon.  The exam was otherwise without abnormality on                            direct and retroflexion views. Complications:            No immediate complications. Estimated blood loss:                            None. Estimated Blood Loss:     Estimated blood loss: none. Impression:               - One 4 mm polyp in the sigmoid colon, removed with                            a cold snare. Resected and retrieved.                           - Diverticulosis in the left colon.                           - The examination was otherwise normal on direct                            and retroflexion views. Recommendation:           - Repeat colonoscopy is not recommended for                            surveillance.                           - Patient has a contact number available for                            emergencies. The signs and  symptoms of potential                            delayed complications were discussed with the                            patient. Return to normal activities tomorrow.                            Written discharge instructions were provided to the                            patient.                           - Resume previous diet.                           - Continue present medications.                           - Await pathology results. Docia Chuck. Henrene Pastor, MD 10/17/2022 10:19:44 AM This report has been signed electronically.

## 2022-10-17 NOTE — Progress Notes (Signed)
Called to room to assist during endoscopic procedure.  Patient ID and intended procedure confirmed with present staff. Received instructions for my participation in the procedure from the performing physician.  

## 2022-10-18 ENCOUNTER — Other Ambulatory Visit (INDEPENDENT_AMBULATORY_CARE_PROVIDER_SITE_OTHER): Payer: Medicare HMO

## 2022-10-18 ENCOUNTER — Telehealth: Payer: Self-pay | Admitting: *Deleted

## 2022-10-18 ENCOUNTER — Encounter: Payer: Medicare HMO | Admitting: Physical Therapy

## 2022-10-18 DIAGNOSIS — E559 Vitamin D deficiency, unspecified: Secondary | ICD-10-CM | POA: Diagnosis not present

## 2022-10-18 DIAGNOSIS — E785 Hyperlipidemia, unspecified: Secondary | ICD-10-CM | POA: Diagnosis not present

## 2022-10-18 LAB — LIPID PANEL
Cholesterol: 189 mg/dL (ref 0–200)
HDL: 53.6 mg/dL (ref >39.00)
NonHDL: 135.55
Total CHOL/HDL Ratio: 4
Triglycerides: 228 mg/dL — ABNORMAL HIGH (ref 0.0–149.0)
VLDL: 45.6 mg/dL — ABNORMAL HIGH (ref 0.0–40.0)

## 2022-10-18 LAB — VITAMIN D 25 HYDROXY (VIT D DEFICIENCY, FRACTURES): VITD: 39.77 ng/mL (ref 30.00–100.00)

## 2022-10-18 LAB — CBC WITH DIFFERENTIAL/PLATELET
Basophils Absolute: 0 10*3/uL (ref 0.0–0.1)
Basophils Relative: 0.6 % (ref 0.0–3.0)
Eosinophils Absolute: 0.2 10*3/uL (ref 0.0–0.7)
Eosinophils Relative: 2.4 % (ref 0.0–5.0)
HCT: 39.6 % (ref 36.0–46.0)
Hemoglobin: 13.3 g/dL (ref 12.0–15.0)
Lymphocytes Relative: 38.9 % (ref 12.0–46.0)
Lymphs Abs: 2.7 10*3/uL (ref 0.7–4.0)
MCHC: 33.5 g/dL (ref 30.0–36.0)
MCV: 90.7 fl (ref 78.0–100.0)
Monocytes Absolute: 0.6 10*3/uL (ref 0.1–1.0)
Monocytes Relative: 8.1 % (ref 3.0–12.0)
Neutro Abs: 3.5 10*3/uL (ref 1.4–7.7)
Neutrophils Relative %: 50 % (ref 43.0–77.0)
Platelets: 233 10*3/uL (ref 150.0–400.0)
RBC: 4.37 Mil/uL (ref 3.87–5.11)
RDW: 15.2 % (ref 11.5–15.5)
WBC: 6.9 10*3/uL (ref 4.0–10.5)

## 2022-10-18 LAB — COMPREHENSIVE METABOLIC PANEL
ALT: 17 U/L (ref 0–35)
AST: 16 U/L (ref 0–37)
Albumin: 4.3 g/dL (ref 3.5–5.2)
Alkaline Phosphatase: 101 U/L (ref 39–117)
BUN: 17 mg/dL (ref 6–23)
CO2: 29 mEq/L (ref 19–32)
Calcium: 9.1 mg/dL (ref 8.4–10.5)
Chloride: 106 mEq/L (ref 96–112)
Creatinine, Ser: 1.22 mg/dL — ABNORMAL HIGH (ref 0.40–1.20)
GFR: 43.45 mL/min — ABNORMAL LOW (ref >60.00)
Glucose, Bld: 114 mg/dL — ABNORMAL HIGH (ref 70–99)
Potassium: 4 mEq/L (ref 3.5–5.1)
Sodium: 143 mEq/L (ref 135–145)
Total Bilirubin: 0.3 mg/dL (ref 0.2–1.2)
Total Protein: 6.4 g/dL (ref 6.0–8.3)

## 2022-10-18 LAB — LDL CHOLESTEROL, DIRECT: Direct LDL: 103 mg/dL

## 2022-10-18 NOTE — Telephone Encounter (Signed)
  Follow up Call-     10/17/2022    9:00 AM  Call back number  Post procedure Call Back phone  # [phone]  Permission to leave phone message Yes    Post procedure follow up phone call. No answer at number given.  Left message on voicemail.

## 2022-10-20 ENCOUNTER — Encounter: Payer: Self-pay | Admitting: Family Medicine

## 2022-10-20 ENCOUNTER — Ambulatory Visit (INDEPENDENT_AMBULATORY_CARE_PROVIDER_SITE_OTHER): Payer: Medicare HMO | Admitting: Family Medicine

## 2022-10-20 VITALS — BP 110/70 | HR 82 | Temp 98.3°F | Ht 68.0 in | Wt 261.0 lb

## 2022-10-20 DIAGNOSIS — R7303 Prediabetes: Secondary | ICD-10-CM | POA: Diagnosis not present

## 2022-10-20 DIAGNOSIS — Z0000 Encounter for general adult medical examination without abnormal findings: Secondary | ICD-10-CM

## 2022-10-20 DIAGNOSIS — Z1283 Encounter for screening for malignant neoplasm of skin: Secondary | ICD-10-CM | POA: Diagnosis not present

## 2022-10-20 DIAGNOSIS — Z87891 Personal history of nicotine dependence: Secondary | ICD-10-CM

## 2022-10-20 MED ORDER — TRAMADOL HCL 50 MG PO TABS: 50.0000 mg | ORAL_TABLET | Freq: Four times a day (QID) | ORAL | 0 refills | Status: DC | PRN

## 2022-10-20 MED ORDER — LISINOPRIL 5 MG PO TABS: 5.0000 mg | ORAL_TABLET | Freq: Every day | ORAL | 3 refills | Status: DC

## 2022-10-20 MED ORDER — CLONAZEPAM 1 MG PO TABS: ORAL_TABLET | ORAL | 1 refills | Status: DC

## 2022-10-20 NOTE — Progress Notes (Signed)
Phone [phone]   Subjective:  Patient presents today for their annual physical. Chief complaint-noted.   See problem oriented charting- ROS- full  review of systems was completed and negative except for: some drooling, ear pain, tinnitus, nausea after ozempic- improving  The following were reviewed and entered/updated in epic: Past Medical History:  Diagnosis Date   Anxiety    years ago - no longer an issue   Arthritis    gout   Cataracts, bilateral    Complication of anesthesia    Fracture of pelvis (HCC)    GERD (gastroesophageal reflux disease)    Hx of chest pain    "anxiety"   Hyperlipidemia    INSOMNIA 10/14/2008   in past   Joint pain    Kidney problem    Low blood sugar    eats several smaller meals a day   Obesity    Palpitations    Stress related    Proteinuria    H/O   Tobacco abuse    quit smoking 08/2013   Patient Active Problem List   Diagnosis Date Noted   Depression 12/19/2016    Priority: High   S/p nephrectomy 03/12/2015    Priority: High   Anxiety state 12/09/2009    Priority: High   Fatty liver 06/12/2022    Priority: Medium    CKD (chronic kidney disease), stage III ([place]) 02/14/2019    Priority: Medium    Prediabetes 10/22/2017    Priority: Medium    Hyperglycemia 09/18/2017    Priority: Medium    Gout 03/12/2015    Priority: Medium    Hyperlipidemia 03/12/2015    Priority: Medium    COPD (chronic obstructive pulmonary disease) ([place]) 03/12/2015    Priority: Medium    Arthritis     Priority: Medium    Former smoker     Priority: Medium    Vitamin D deficiency 05/28/2009    Priority: Medium    Hematuria, unspecified 05/26/2009    Priority: Medium    Hearing loss 01/20/2009    Priority: Medium    MICROALBUMINURIA 01/23/2008    Priority: Medium    Osteoarthritis of spine with radiculopathy, cervical region 02/12/2018    Priority: Low   Intertrigo 01/10/2018    Priority: Low   History of osteitis pubis 12/19/2017     Priority: Low   Eczema 06/29/2015    Priority: Low   Tinnitus 03/12/2015    Priority: Low   History of colonic polyps 03/12/2015    Priority: Low   Bimalleolar fracture 07/02/2014    Priority: Low   Arthropathy 05/26/2009    Priority: Low   Insomnia, unspecified 04/27/2009    Priority: Low   DDD (degenerative disc disease), lumbosacral 01/20/2009    Priority: Low   Sialoadenitis 01/20/2009    Priority: Low   GERD 01/23/2008    Priority: Low   Microscopic hematuria 01/23/2008    Priority: Low   UTI (urinary tract infection) 01/08/2008    Priority: Low   Snoring 01/06/2020   Atypical chest pain 03/26/2017   Contact dermatitis and other eczema, due to unspecified cause 05/26/2009   Past Surgical History:  Procedure Laterality Date   ABDOMINAL HYSTERECTOMY     APPENDECTOMY     with hysterectomy   childbirth     x4   CHOLECYSTECTOMY  2001   EYE SURGERY Bilateral 2015   cataract surgery with lens implant   NEPHRECTOMY  1992   complex cystic mass - nephrectomy right  ORIF ANKLE FRACTURE Right 07/02/2014   Procedure: OPEN REDUCTION INTERNAL FIXATION (ORIF) RIGHT ANKLE BIMALLOELAR FRACTURE;  Surgeon: Wylene Simmer, MD;  Location: [facility];  Service: Orthopedics;  Laterality: Right;   OTHER SURGICAL HISTORY     hysterectomy, R ovary remains   SHOULDER SURGERY     bone spurs left    Family History  Problem Relation Age of Onset   Ovarian cancer Mother        Dr. Jana Hakim    Arthritis Mother    Obesity Mother    Stroke Father    Alzheimer's disease Father        early, states also parkinsons. died 60   Hypertension Father    Obesity Father    Colon cancer Paternal Grandmother    Liver disease Daughter        On hospice age 86   Esophageal cancer Neg Hx    Stomach cancer Neg Hx    Rectal cancer Neg Hx     Medications- reviewed and updated Current Outpatient Medications  Medication Sig Dispense Refill   albuterol (VENTOLIN HFA) 108 (90 Base) MCG/ACT inhaler       allopurinol (ZYLOPRIM) 300 MG tablet Take 1 tablet (300 mg total) by mouth 2 (two) times daily. 180 tablet 3   CALCIUM-MAGNESIUM-ZINC PO Take by mouth.     CRANBERRY PO Take by mouth.     diazepam (VALIUM) 10 MG tablet Take 1 tablet (10 mg total) by mouth every 6 (six) hours as needed (as needed for muscle spasms). 30 tablet 0   diclofenac sodium (VOLTAREN) 1 % GEL Apply topically to affected area qid 100 g 2   Ginger, Zingiber officinalis, (GINGER ROOT) 550 MG CAPS Take by mouth.     lisinopril (ZESTRIL) 5 MG tablet Take 1 tablet (5 mg total) by mouth daily. 90 tablet 3   metoCLOPramide (REGLAN) 10 MG tablet Take 1 tablet (10 mg total) by mouth daily as needed for nausea. 90 tablet 3   nystatin cream (MYCOSTATIN) APPLY 1 APPLICATION TOPICALLY TO RASH IN GROIN TWICE DAILY FOR UP TO 2 WEEKS MAXIMUM 90 g 2   ondansetron (ZOFRAN-ODT) 4 MG disintegrating tablet Take 1 tablet (4 mg total) by mouth every 8 (eight) hours as needed for nausea or vomiting. 20 tablet 1   OVER THE COUNTER MEDICATION Cherry extract bid     OVER THE COUNTER MEDICATION B-12 one daily     PRILOSEC 20 MG capsule Take 1 capsule (20 mg total) by mouth daily. 90 capsule 3   sertraline (ZOLOFT) 50 MG tablet Take 1 tablet (50 mg total) by mouth daily. 90 tablet 1   simvastatin (ZOCOR) 20 MG tablet Take 1 tablet (20 mg total) by mouth at bedtime. 90 tablet 3   Sodium Bicarbonate POWD by Does not apply route.     triamcinolone cream (KENALOG) 0.1 % Apply 1 application topically 2 (two) times daily. For 7-10 days for recurring eczema issues 80 g 1   TURMERIC PO Take by mouth.     valACYclovir (VALTREX) 1000 MG tablet Take 2 pills twice a day for 1 day at first sign of cold sore 28 tablet 1   clonazePAM (KLONOPIN) 1 MG tablet TAKE 0.5- 1 TABLET(0.5 MG- 1 mg) BY MOUTH AT BEDTIME AS NEEDED. Do not drive for at least 8 hours after taking 90 tablet 1   traMADol (ULTRAM) 50 MG tablet Take 1 tablet (50 mg total) by mouth every 6 (six) hours as  needed for  moderate pain or severe pain. 60 tablet 0   No current facility-administered medications for this visit.    Allergies-reviewed and updated Allergies  Allergen Reactions   Epinephrine Palpitations   Codeine Nausea Only   Ozempic (0.25 Or 0.5 Mg-Dose) [Semaglutide(0.25 Or 0.'5mg'$ -Dos)]     I have had a terrible reaction with Ozempic. I was on vacation at [name] and started vomiting and gas pains like I was in labor. On Sunday I started passing bright red blood with bowel movements. I drove back yesterday. I have not eaten anything solid for 2 days. I am not going to continue taking this.    Penicillins Nausea Only   Prednisone Cough    Social History   Social History Narrative   Divorced. Found love of her life recently in 2016 ([place]). 3 sons. 5 grandkids.    Daughter (had at age 58 and was adopted). To care for [name] her granddaughter when daughter passes- daughter on hospice.       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 Oropharynx normal Neck: no thyromegaly CV: RRR no murmurs rubs or gallops Lungs: CTAB no crackles, wheeze, rhonchi Abdomen: soft/nontender/nondistended/normal bowel sounds. No rebound or guarding.  Ext: no edema Skin: warm, dry Neuro: grossly normal, moves all extremities, PERRLA    Assessment and Plan   76 y.o. female presenting for annual physical.  Health Maintenance counseling: 1. Anticipatory guidance: Patient counseled regarding regular dental exams -q6 months, eye exams -  yearly,  avoiding smoking and second hand smoke , limiting alcohol to 1 beverage per day- doesn't drink - exceedingly rare, no illicit drugs .   2. Risk factor reduction:  Advised patient of need for regular exercise and diet rich and fruits and vegetables to reduce risk of heart attack and stroke.  Exercise- working through PT exercises- helping core and plans on starting [employer]well.  Diet/weight management-down 5 lbs from march (down 10 lbs on home scales).  Wt Readings from Last 3 Encounters:  10/20/22 261 lb (118.4 kg)  10/17/22 263 lb (119.3 kg)  09/20/22 263 lb (119.3 kg)  3. Immunizations/screenings/ancillary studies- shingrix- considering , rsv- declines , declines covid vaccination long term Immunization History  Administered Date(s) Administered   Influenza, High Dose Seasonal PF 09/29/2016, 09/05/2017, 08/19/2018   Influenza,inj,Quad PF,6+ Mos 08/24/2015   Influenza,inj,quad, With Preservative 08/27/2017   Moderna Sars-Covid-2 Vaccination 02/14/2020, 03/07/2020   Pneumococcal Conjugate-13 03/12/2015   Pneumococcal Polysaccharide-23 01/09/2017   Td 04/24/2016   Zoster, Live 03/12/2015  4. Cervical cancer screening- past age based screening guidelines- no history abnormal pap 5. Breast cancer screening-  breast exam- prefers self exam- and mammogram 07/10/22 6. Colon cancer screening - 10/17/22 adenoma with Dr. Henrene Pastor but was told released  7. Skin cancer screening- refer to dermatology. advised regular sunscreen use. Denies worrisome, changing, or new skin lesions.  8. Birth control/STD check- declines- not active 9. Osteoporosis screening at 55- DEXA normal in 2018- wants to  hold off 10. Smoking associated screening - former smoker- 35 pack years and quit 2014- ok with lung cancer screening program  Status of chronic or acute concerns    #Hyperlipidemia S: Compliant with simvastatin 20 mg - mild poor control Lab Results  Component Value Date   CHOL 189 10/18/2022   HDL 53.60  10/18/2022   LDLCALC 112 (H) 05/04/2020   LDLDIRECT 103.0 10/18/2022   TRIG 228.0 (H) 10/18/2022   CHOLHDL 4 10/18/2022  A/P: mild elevations- wants to avoid increasing dose- she is concerned about prediabetes risk   #Unilateral kidney/microalbuminuria S: Compliant with lisinopril 2.5 mg tablets-takes 2 at a time- primarily on for renal protective effects with history of microalbuminuria  A/P: doing well on lisinopril 5 mg- we will update rx . Kidney function stable  #Gout S: 0 flares in years on allopurinol 300 mg twice daily Lab Results  Component Value Date   LABURIC 3.7 05/04/2020  A/P:doing well- continue current meds   # COPD S: radiographic finding only- asymptomatic Maintenance medications: None Patient has had to use albuterol  maybe once a year.  A/P: asymptomatic still for most part- refill albuterol as needed  #Vitamin D deficiency S: Medication: Has required high-dose vitamin D in the past.  Currently 1000 units we think  Last vitamin D Lab Results  Component Value Date   VD25OH 39.77 10/18/2022  A/P:  Controlled. Continue current medications.     #Depression-full remission/anxiety S: meds: sertraline 50 mg Due to anxiety/stress-uses clonazepam-uses pretty regularly for sleep- able at times to cut in half.   -Uses very sparing diazepam for spasms in her neck/ balance ear- does not use these 2 together. Has nto used in over a year     10/20/2022    2:08 PM 06/02/2022    1:06 PM 05/30/2022    8:57 AM  Depression screen PHQ 2/9  Decreased Interest 0 0 1  Down, Depressed, Hopeless 0 1 1  PHQ - 2 Score 0 1 2  Altered sleeping 0  0  Tired, decreased energy 0  0  Change in appetite 0  0  Feeling bad or failure about yourself  0  0  Trouble concentrating 0  0  Moving slowly or fidgety/restless 0  0  Suicidal thoughts 0  0  PHQ-9 Score 0  2  Difficult doing work/chores Not difficult at all Not difficult at all Not difficult at all  A/P: full remisison- continue  current meds   #fatty liver- noted on abdominal ultrasound July 2023- LFTs have been ok- knows importance of weight loss - she is making efforts on this - does not drink- cant remember last drink Lab Results  Component Value Date   ALT 17 10/18/2022   AST 16 10/18/2022   ALKPHOS 101 10/18/2022   BILITOT 0.3 10/18/2022   #ozempic now off- vomiting see mychart 09/16/22- "I have had a terrible reaction with Ozempic. I was on vacation at [facility] and started vomiting and gas pains like I was in labor. On Sunday I started passing bright red blood with bowel movements. I drove back yesterday. I have not eaten anything solid for 2 days. I am not going to continue taking this."  - some lingering nausea even after stopping but no vomiting- zofran helps but doesn't want to take forever. Also on omeprazole/prilosec already for GERD  #Prior pelvic pain- had been on gabapentin with Dr. Paulla Fore years ago- she came offa month ago and no worsening pain- we opted  to discontinue  #MSK pain- in past with sciatica or with pelvic pain. Doing somewhat better with PT but wants to have on hand in case recurs  # Hearing loss- is planning on getting hearing aids  Recommended follow up: Return in about 6 months (around 04/20/2023) for followup or sooner if needed.Schedule b4 you leave. Future Appointments  Date Time Provider [place]  06/21/2023  1:00 PM [facility]   Lab/Order associations: already had labs   ICD-10-CM   1. Preventative health care  Z00.00     2. Former smoker  Z87.891 Ambulatory Referral for Lung Cancer Scre    3. Screening exam for skin cancer  Z12.83 Ambulatory referral to Dermatology      Meds ordered this encounter  Medications   lisinopril (ZESTRIL) 5 MG tablet    Sig: Take 1 tablet (5 mg total) by mouth daily.    Dispense:  90 tablet    Refill:  6    Discontinue old 2.5 mg script   clonazePAM (KLONOPIN) 1 MG tablet    Sig: TAKE 0.5- 1  TABLET(0.5 MG- 1 mg) BY MOUTH AT BEDTIME AS NEEDED. Do not drive for at least 8 hours after taking    Dispense:  90 tablet    Refill:  1   traMADol (ULTRAM) 50 MG tablet    Sig: Take 1 tablet (50 mg total) by mouth every 6 (six) hours as needed for moderate pain or severe pain.    Dispense:  60 tablet    Refill:  0    Return precautions advised.  Garret Reddish, MD

## 2022-10-20 NOTE — Patient Instructions (Addendum)
Please check with your pharmacy to see if they have the shingrix vaccine. If they do- please get this immunization and update [name] by phone call or mychart with dates you receive the vaccine  We will call you within two weeks about your referral to lung cancer screening program through pulmonary AND dermatology AND diabetes education- we are looking at yearlong prediabetes program. If you do not hear within 2 weeks, give [name] a call.   Recommended follow up: Return in about 6 months (around 04/20/2023) for followup or sooner if needed.Schedule b4 you leave.

## 2022-10-22 ENCOUNTER — Encounter: Payer: Self-pay | Admitting: Internal Medicine

## 2022-11-01 ENCOUNTER — Encounter: Payer: Medicare HMO | Admitting: Physical Therapy

## 2022-11-08 ENCOUNTER — Other Ambulatory Visit: Payer: Self-pay | Admitting: Family Medicine

## 2022-11-09 ENCOUNTER — Encounter: Payer: Self-pay | Admitting: Family Medicine

## 2022-11-13 ENCOUNTER — Other Ambulatory Visit: Payer: Self-pay

## 2022-11-13 DIAGNOSIS — H9190 Unspecified hearing loss, unspecified ear: Secondary | ICD-10-CM

## 2022-11-29 ENCOUNTER — Encounter: Payer: Self-pay | Admitting: Family Medicine

## 2022-11-30 ENCOUNTER — Encounter: Payer: Self-pay | Admitting: Physician Assistant

## 2022-11-30 ENCOUNTER — Encounter: Payer: Medicare HMO | Admitting: Physician Assistant

## 2022-12-01 NOTE — Progress Notes (Signed)
Appointment was canceled.  Patient's symptoms had resolved.

## 2022-12-05 DIAGNOSIS — G4733 Obstructive sleep apnea (adult) (pediatric): Secondary | ICD-10-CM | POA: Diagnosis not present

## 2022-12-11 ENCOUNTER — Other Ambulatory Visit: Payer: Self-pay | Admitting: Family Medicine

## 2022-12-11 DIAGNOSIS — L821 Other seborrheic keratosis: Secondary | ICD-10-CM | POA: Diagnosis not present

## 2022-12-11 DIAGNOSIS — D1801 Hemangioma of skin and subcutaneous tissue: Secondary | ICD-10-CM | POA: Diagnosis not present

## 2023-01-02 ENCOUNTER — Other Ambulatory Visit: Payer: Self-pay

## 2023-01-02 ENCOUNTER — Encounter: Payer: Self-pay | Admitting: Family Medicine

## 2023-01-02 MED ORDER — OMEPRAZOLE 20 MG PO CPDR: 20.0000 mg | DELAYED_RELEASE_CAPSULE | Freq: Two times a day (BID) | ORAL | 3 refills | Status: DC

## 2023-01-04 ENCOUNTER — Encounter: Payer: Self-pay | Admitting: Family Medicine

## 2023-01-05 ENCOUNTER — Encounter: Payer: Self-pay | Admitting: Family Medicine

## 2023-01-05 DIAGNOSIS — G4733 Obstructive sleep apnea (adult) (pediatric): Secondary | ICD-10-CM | POA: Diagnosis not present

## 2023-01-24 ENCOUNTER — Telehealth: Payer: Medicare HMO | Admitting: Nurse Practitioner

## 2023-01-24 DIAGNOSIS — J440 Chronic obstructive pulmonary disease with acute lower respiratory infection: Secondary | ICD-10-CM | POA: Diagnosis not present

## 2023-01-24 DIAGNOSIS — J069 Acute upper respiratory infection, unspecified: Secondary | ICD-10-CM | POA: Diagnosis not present

## 2023-01-24 DIAGNOSIS — J209 Acute bronchitis, unspecified: Secondary | ICD-10-CM

## 2023-01-24 MED ORDER — BENZONATATE 100 MG PO CAPS: 100.0000 mg | ORAL_CAPSULE | Freq: Three times a day (TID) | ORAL | 0 refills | Status: DC | PRN

## 2023-01-24 MED ORDER — AZITHROMYCIN 250 MG PO TABS: ORAL_TABLET | ORAL | 0 refills | Status: DC

## 2023-01-24 MED ORDER — IPRATROPIUM BROMIDE 0.03 % NA SOLN: 2.0000 | Freq: Two times a day (BID) | NASAL | 12 refills | Status: AC

## 2023-01-24 MED ORDER — ALBUTEROL SULFATE HFA 108 (90 BASE) MCG/ACT IN AERS: 2.0000 | INHALATION_SPRAY | Freq: Four times a day (QID) | RESPIRATORY_TRACT | 0 refills | Status: DC | PRN

## 2023-01-24 NOTE — Progress Notes (Signed)
We are sorry that you are not feeling well.  Here is how we plan to help!  Based on your presentation I believe you most likely have A cough due to bacteria.  When patients have a fever and a productive cough with a change in color or increased sputum production, we are concerned about bacterial bronchitis.  If left untreated it can progress to pneumonia.  If your symptoms do not improve with your treatment plan it is important that you contact your provider.   I have prescribed Azithromyin 250 mg: two tablets now and then one tablet daily for 4 additonal days    In addition you may use A prescription cough medication called Tessalon Perles 133m. You may take 1-2 capsules every 8 hours as needed for your cough.  We will also refill your rescue inhaler and a nasal spray as well.  I would recommend when you pick up these medications to also get a home COVID test. If you are positive please schedule a follow up today with your primary care, or a virtual video visit with our team.    Meds ordered this encounter  Medications   azithromycin (ZITHROMAX) 250 MG tablet    Sig: Take 2 tablets on day 1, then 1 tablet daily on days 2 through 5    Dispense:  6 tablet    Refill:  0   benzonatate (TESSALON) 100 MG capsule    Sig: Take 1 capsule (100 mg total) by mouth 3 (three) times daily as needed.    Dispense:  30 capsule    Refill:  0   albuterol (VENTOLIN HFA) 108 (90 Base) MCG/ACT inhaler    Sig: Inhale 2 puffs into the lungs every 6 (six) hours as needed for wheezing or shortness of breath.    Dispense:  8 g    Refill:  0   ipratropium (ATROVENT) 0.03 % nasal spray    Sig: Place 2 sprays into both nostrils every 12 (twelve) hours.    Dispense:  30 mL    Refill:  12    From your responses in the eVisit questionnaire you describe inflammation in the upper respiratory tract which is causing a significant cough.  This is commonly called Bronchitis and has four common causes:   Allergies Viral  Infections Acid Reflux Bacterial Infection Allergies, viruses and acid reflux are treated by controlling symptoms or eliminating the cause. An example might be a cough caused by taking certain blood pressure medications. You stop the cough by changing the medication. Another example might be a cough caused by acid reflux. Controlling the reflux helps control the cough.  USE OF BRONCHODILATOR ("RESCUE") INHALERS: There is a risk from using your bronchodilator too frequently.  The risk is that over-reliance on a medication which only relaxes the muscles surrounding the breathing tubes can reduce the effectiveness of medications prescribed to reduce swelling and congestion of the tubes themselves.  Although you feel brief relief from the bronchodilator inhaler, your asthma may actually be worsening with the tubes becoming more swollen and filled with mucus.  This can delay other crucial treatments, such as oral steroid medications. If you need to use a bronchodilator inhaler daily, several times per day, you should discuss this with your provider.  There are probably better treatments that could be used to keep your asthma under control.     HOME CARE Only take medications as instructed by your medical team. Complete the entire course of an antibiotic. Drink plenty of  fluids and get plenty of rest. Avoid close contacts especially the very young and the elderly Cover your mouth if you cough or cough into your sleeve. Always remember to wash your hands A steam or ultrasonic humidifier can help congestion.   GET HELP RIGHT AWAY IF: You develop worsening fever. You become short of breath You cough up blood. Your symptoms persist after you have completed your treatment plan MAKE SURE YOU  Understand these instructions. Will watch your condition. Will get help right away if you are not doing well or get worse.    Thank you for choosing an e-visit.  Your e-visit answers were reviewed by a board  certified advanced clinical practitioner to complete your personal care plan. Depending upon the condition, your plan could have included both over the counter or prescription medications.  Please review your pharmacy choice. Make sure the pharmacy is open so you can pick up prescription now. If there is a problem, you may contact your provider through [employer] and have the prescription routed to another pharmacy.  Your safety is important to [name]. If you have drug allergies check your prescription carefully.   For the next 24 hours you can use MyChart to ask questions about today's visit, request a non-urgent call back, or ask for a work or school excuse. You will get an email in the next two days asking about your experience. I hope that your e-visit has been valuable and will speed your recovery.  I spent approximately 7 minutes reviewing the patient's history, current symptoms and coordinating their plan of care today.

## 2023-01-25 ENCOUNTER — Encounter: Payer: Self-pay | Admitting: Family Medicine

## 2023-01-26 ENCOUNTER — Other Ambulatory Visit: Payer: Self-pay

## 2023-01-26 DIAGNOSIS — H9202 Otalgia, left ear: Secondary | ICD-10-CM

## 2023-01-29 ENCOUNTER — Encounter: Payer: Self-pay | Admitting: Physician Assistant

## 2023-01-29 ENCOUNTER — Ambulatory Visit (INDEPENDENT_AMBULATORY_CARE_PROVIDER_SITE_OTHER): Payer: Medicare HMO | Admitting: Physician Assistant

## 2023-01-29 VITALS — BP 120/70 | HR 90 | Temp 98.0°F | Ht 68.0 in | Wt 264.0 lb

## 2023-01-29 DIAGNOSIS — H9202 Otalgia, left ear: Secondary | ICD-10-CM

## 2023-01-29 MED ORDER — CEFDINIR 300 MG PO CAPS: 300.0000 mg | ORAL_CAPSULE | Freq: Two times a day (BID) | ORAL | 0 refills | Status: DC

## 2023-01-29 NOTE — Progress Notes (Signed)
Angie Velasquez is a 77 y.o. female here for a follow up of a pre-existing problem.  History of Present Illness:   Chief Complaint  Patient presents with   Otalgia    Pt c/o Left ear pain, started last Monday.    Otalgia     Left ear pain Pain x 1 week Getting worse with time Had bronchitis and was treated by e-visit provider with azithromycin, benzonatate, nasal spray She took this as prescribed She is taking the nasal spray prn at this point Denies: fevers, chills, n/v/d  Having significant itching in her ear as well  Past Medical History:  Diagnosis Date   Anxiety    years ago - no longer an issue   Arthritis    gout   Cataracts, bilateral    Complication of anesthesia    Fracture of pelvis (HCC)    GERD (gastroesophageal reflux disease)    Hx of chest pain    "anxiety"   Hyperlipidemia    INSOMNIA 10/14/2008   in past   Joint pain    Kidney problem    Low blood sugar    eats several smaller meals a day   Obesity    Palpitations    Stress related    Proteinuria    H/O   Tobacco abuse    quit smoking 08/2013     Social History   Tobacco Use   Smoking status: Former    Packs/day: 1.00    Years: 35.00    Total pack years: 35.00    Types: Cigarettes    Quit date: 09/01/2013    Years since quitting: 9.4   Smokeless tobacco: Never   Tobacco comments:    Former smoker with no desire to smoke again. Smoking years edited at patients request to include periods when she did not smoke.  Substance Use Topics   Alcohol use: Yes    Alcohol/week: 1.0 standard drink of alcohol    Types: 1 Shots of liquor per week   Drug use: No    Past Surgical History:  Procedure Laterality Date   ABDOMINAL HYSTERECTOMY     APPENDECTOMY     with hysterectomy   childbirth     x4   CHOLECYSTECTOMY  2001   EYE SURGERY Bilateral 2015   cataract surgery with lens implant   NEPHRECTOMY  1992   complex cystic mass - nephrectomy right   ORIF ANKLE FRACTURE  Right 07/02/2014   Procedure: OPEN REDUCTION INTERNAL FIXATION (ORIF) RIGHT ANKLE BIMALLOELAR FRACTURE;  Surgeon: Wylene Simmer, MD;  Location: [facility];  Service: Orthopedics;  Laterality: Right;   OTHER SURGICAL HISTORY     hysterectomy, R ovary remains   SHOULDER SURGERY     bone spurs left    Family History  Problem Relation Age of Onset   Ovarian cancer Mother        Dr. Jana Hakim    Arthritis Mother    Obesity Mother    Stroke Father    Alzheimer's disease Father        early, states also parkinsons. died 75   Hypertension Father    Obesity Father    Colon cancer Paternal Grandmother    Liver disease Daughter        On hospice age 77   Esophageal cancer Neg Hx    Stomach cancer Neg Hx    Rectal cancer Neg Hx     Allergies  Allergen Reactions   Epinephrine Palpitations   Codeine Nausea  Only   Ozempic (0.25 Or 0.5 Mg-Dose) [Semaglutide(0.25 Or 0.'5mg'$ -Dos)]     I have had a terrible reaction with Ozempic. I was on vacation at [facility] and started vomiting and gas pains like I was in labor. On Sunday I started passing bright red blood with bowel movements. I drove back yesterday. I have not eaten anything solid for 2 days. I am not going to continue taking this.    Penicillins Nausea Only   Prednisone Cough    Current Medications:   Current Outpatient Medications:    albuterol (VENTOLIN HFA) 108 (90 Base) MCG/ACT inhaler, , Disp: , Rfl:    albuterol (VENTOLIN HFA) 108 (90 Base) MCG/ACT inhaler, Inhale 2 puffs into the lungs every 6 (six) hours as needed for wheezing or shortness of breath., Disp: 8 g, Rfl: 0   allopurinol (ZYLOPRIM) 300 MG tablet, Take 1 tablet (300 mg total) by mouth 2 (two) times daily., Disp: 180 tablet, Rfl: 3   benzonatate (TESSALON) 100 MG capsule, Take 1 capsule (100 mg total) by mouth 3 (three) times daily as needed., Disp: 30 capsule, Rfl: 0   CALCIUM-MAGNESIUM-ZINC PO, Take by mouth., Disp: , Rfl:    clonazePAM (KLONOPIN) 1 MG tablet, TAKE 0.5- 1  TABLET(0.5 MG- 1 mg) BY MOUTH AT BEDTIME AS NEEDED. Do not drive for at least 8 hours after taking, Disp: 90 tablet, Rfl: 1   CRANBERRY PO, Take by mouth., Disp: , Rfl:    diazepam (VALIUM) 10 MG tablet, Take 1 tablet (10 mg total) by mouth every 6 (six) hours as needed (as needed for muscle spasms)., Disp: 30 tablet, Rfl: 0   diclofenac sodium (VOLTAREN) 1 % GEL, Apply topically to affected area qid, Disp: 100 g, Rfl: 2   Ginger, Zingiber officinalis, (GINGER ROOT) 550 MG CAPS, Take by mouth., Disp: , Rfl:    ipratropium (ATROVENT) 0.03 % nasal spray, Place 2 sprays into both nostrils every 12 (twelve) hours., Disp: 30 mL, Rfl: 12   lisinopril (ZESTRIL) 5 MG tablet, Take 1 tablet (5 mg total) by mouth daily., Disp: 90 tablet, Rfl: 3   metoCLOPramide (REGLAN) 10 MG tablet, Take 1 tablet (10 mg total) by mouth daily as needed for nausea., Disp: 90 tablet, Rfl: 3   nystatin cream (MYCOSTATIN), APPLY 1 APPLICATION TOPICALLY TO RASH IN GROIN TWICE DAILY FOR UP TO 2 WEEKS MAXIMUM, Disp: 90 g, Rfl: 2   omeprazole (PRILOSEC) 20 MG capsule, Take 1 capsule (20 mg total) by mouth 2 (two) times daily before a meal., Disp: 180 capsule, Rfl: 3   ondansetron (ZOFRAN-ODT) 4 MG disintegrating tablet, TAKE 1 TABLET BY MOUTH EVERY 8 HOURS AS NEEDED FOR NAUSEA AND VOMITING, Disp: 20 tablet, Rfl: 1   OVER THE COUNTER MEDICATION, Cherry extract bid, Disp: , Rfl:    OVER THE COUNTER MEDICATION, B-12 one daily, Disp: , Rfl:    PRILOSEC 20 MG capsule, Take 1 capsule (20 mg total) by mouth daily., Disp: 90 capsule, Rfl: 3   sertraline (ZOLOFT) 50 MG tablet, TAKE 1 TABLET BY MOUTH EVERY DAY, Disp: 90 tablet, Rfl: 1   simvastatin (ZOCOR) 20 MG tablet, TAKE 1 TABLET BY MOUTH EVERYDAY AT BEDTIME, Disp: 90 tablet, Rfl: 1   Sodium Bicarbonate POWD, by Does not apply route., Disp: , Rfl:    traMADol (ULTRAM) 50 MG tablet, Take 1 tablet (50 mg total) by mouth every 6 (six) hours as needed for moderate pain or severe pain., Disp: 60  tablet, Rfl: 0  triamcinolone cream (KENALOG) 0.1 %, Apply 1 application topically 2 (two) times daily. For 7-10 days for recurring eczema issues, Disp: 80 g, Rfl: 1   TURMERIC PO, Take by mouth., Disp: , Rfl:    valACYclovir (VALTREX) 1000 MG tablet, Take 2 pills twice a day for 1 day at first sign of cold sore, Disp: 28 tablet, Rfl: 1   Review of Systems:   Review of Systems  HENT:  Positive for ear pain.    Negative unless otherwise specified per HPI.  Vitals:   Vitals:   01/29/23 0950  BP: 120/70  Pulse: 90  Temp: 98 F (36.7 C)  TempSrc: Temporal  SpO2: 94%  Weight: 264 lb (119.7 kg)  Height: '5\' 8"'$  (1.727 m)     Body mass index is 40.14 kg/m.  Physical Exam:   Physical Exam Vitals and nursing note reviewed.  Constitutional:      General: She is not in acute distress.    Appearance: She is well-developed. She is not ill-appearing or toxic-appearing.  HENT:     Head: Normocephalic and atraumatic.     Right Ear: Tympanic membrane, ear canal and external ear normal. Tympanic membrane is not erythematous, retracted or bulging.     Left Ear: Ear canal and external ear normal. A middle ear effusion is present. Tympanic membrane is erythematous. Tympanic membrane is not retracted or bulging.     Nose: Nose normal.     Right Sinus: No maxillary sinus tenderness or frontal sinus tenderness.     Left Sinus: No maxillary sinus tenderness or frontal sinus tenderness.     Mouth/Throat:     Pharynx: Uvula midline. No posterior oropharyngeal erythema.  Eyes:     General: Lids are normal.     Conjunctiva/sclera: Conjunctivae normal.  Neck:     Trachea: Trachea normal.  Cardiovascular:     Rate and Rhythm: Normal rate and regular rhythm.     Heart sounds: Normal heart sounds, S1 normal and S2 normal.  Pulmonary:     Effort: Pulmonary effort is normal.     Breath sounds: Normal breath sounds. No decreased breath sounds, wheezing, rhonchi or rales.  Lymphadenopathy:      Cervical: No cervical adenopathy.  Skin:    General: Skin is warm and dry.  Neurological:     Mental Status: She is alert.  Psychiatric:        Speech: Speech normal.        Behavior: Behavior normal. Behavior is cooperative.     Assessment and Plan:   Left ear pain Ear appears to still be quite infected Will trial omnicef - she cannot tolerate PCN but has had cephalasporins in the past Recommend continued nasal spray and use consistently for a few days to help with fluid Also trial tylenol and may use very small amount of ibuprofen if needed for pain/inflammation ENT referral pending Follow-up if new/worsening sx    Inda Coke, PA-C

## 2023-01-29 NOTE — Patient Instructions (Addendum)
It was great to see you!  Continue nasal spray and start antibiotic  Use ibuprofen as needed for pain and swelling  Keep [name] posted on your symptoms  Take care,  Inda Coke PA-C

## 2023-02-01 ENCOUNTER — Encounter: Payer: Self-pay | Admitting: Family Medicine

## 2023-02-02 MED ORDER — DOXYCYCLINE HYCLATE 100 MG PO TABS: 100.0000 mg | ORAL_TABLET | Freq: Two times a day (BID) | ORAL | 0 refills | Status: DC

## 2023-02-03 DIAGNOSIS — G4733 Obstructive sleep apnea (adult) (pediatric): Secondary | ICD-10-CM | POA: Diagnosis not present

## 2023-02-05 ENCOUNTER — Encounter: Payer: Self-pay | Admitting: Family Medicine

## 2023-02-05 ENCOUNTER — Other Ambulatory Visit: Payer: Self-pay

## 2023-02-05 MED ORDER — DOXYCYCLINE HYCLATE 100 MG PO TABS: 100.0000 mg | ORAL_TABLET | Freq: Two times a day (BID) | ORAL | 0 refills | Status: AC

## 2023-02-06 ENCOUNTER — Other Ambulatory Visit: Payer: Self-pay

## 2023-02-06 DIAGNOSIS — R110 Nausea: Secondary | ICD-10-CM

## 2023-02-13 ENCOUNTER — Ambulatory Visit (INDEPENDENT_AMBULATORY_CARE_PROVIDER_SITE_OTHER): Payer: Medicare HMO | Admitting: Family Medicine

## 2023-02-13 ENCOUNTER — Encounter: Payer: Self-pay | Admitting: Family Medicine

## 2023-02-13 VITALS — BP 120/70 | HR 72 | Temp 98.2°F | Ht 68.0 in | Wt 264.4 lb

## 2023-02-13 DIAGNOSIS — N1831 Chronic kidney disease, stage 3a: Secondary | ICD-10-CM

## 2023-02-13 DIAGNOSIS — K760 Fatty (change of) liver, not elsewhere classified: Secondary | ICD-10-CM

## 2023-02-13 DIAGNOSIS — R110 Nausea: Secondary | ICD-10-CM

## 2023-02-13 DIAGNOSIS — Z905 Acquired absence of kidney: Secondary | ICD-10-CM | POA: Diagnosis not present

## 2023-02-13 MED ORDER — METOCLOPRAMIDE HCL 5 MG PO TABS: 5.0000 mg | ORAL_TABLET | Freq: Three times a day (TID) | ORAL | 0 refills | Status: DC | PRN

## 2023-02-13 NOTE — Progress Notes (Signed)
Phone [phone] In person visit   Subjective:   Angie Velasquez is a 77 y.o. year old very pleasant female patient who presents for/with See problem oriented charting Chief Complaint  Patient presents with   Nausea   Past Medical History-  Patient Active Problem List   Diagnosis Date Noted   Depression 12/19/2016    Priority: High   S/p nephrectomy 03/12/2015    Priority: High   Anxiety state 12/09/2009    Priority: High   Fatty liver 06/12/2022    Priority: Medium    CKD (chronic kidney disease), stage III ([place]) 02/14/2019    Priority: Medium    Prediabetes 10/22/2017    Priority: Medium    Hyperglycemia 09/18/2017    Priority: Medium    Gout 03/12/2015    Priority: Medium    Hyperlipidemia 03/12/2015    Priority: Medium    COPD (chronic obstructive pulmonary disease) ([place]) 03/12/2015    Priority: Medium    Arthritis     Priority: Medium    Former smoker     Priority: Medium    Vitamin D deficiency 05/28/2009    Priority: Medium    Hematuria, unspecified 05/26/2009    Priority: Medium    Hearing loss 01/20/2009    Priority: Medium    MICROALBUMINURIA 01/23/2008    Priority: Medium    Osteoarthritis of spine with radiculopathy, cervical region 02/12/2018    Priority: Low   Intertrigo 01/10/2018    Priority: Low   History of osteitis pubis 12/19/2017    Priority: Low   Eczema 06/29/2015    Priority: Low   Tinnitus 03/12/2015    Priority: Low   History of colonic polyps 03/12/2015    Priority: Low   Bimalleolar fracture 07/02/2014    Priority: Low   Arthropathy 05/26/2009    Priority: Low   Insomnia, unspecified 04/27/2009    Priority: Low   DDD (degenerative disc disease), lumbosacral 01/20/2009    Priority: Low   Sialoadenitis 01/20/2009    Priority: Low   GERD 01/23/2008    Priority: Low   Microscopic hematuria 01/23/2008    Priority: Low   UTI (urinary tract infection) 01/08/2008    Priority: Low   Snoring 01/06/2020    Atypical chest pain 03/26/2017   Contact dermatitis and other eczema, due to unspecified cause 05/26/2009    Medications- reviewed and updated Current Outpatient Medications  Medication Sig Dispense Refill   albuterol (VENTOLIN HFA) 108 (90 Base) MCG/ACT inhaler      albuterol (VENTOLIN HFA) 108 (90 Base) MCG/ACT inhaler Inhale 2 puffs into the lungs every 6 (six) hours as needed for wheezing or shortness of breath. 8 g 0   allopurinol (ZYLOPRIM) 300 MG tablet Take 1 tablet (300 mg total) by mouth 2 (two) times daily. 180 tablet 3   CALCIUM-MAGNESIUM-ZINC PO Take by mouth.     clonazePAM (KLONOPIN) 1 MG tablet TAKE 0.5- 1 TABLET(0.5 MG- 1 mg) BY MOUTH AT BEDTIME AS NEEDED. Do not drive for at least 8 hours after taking 90 tablet 1   CRANBERRY PO Take by mouth.     diazepam (VALIUM) 10 MG tablet Take 1 tablet (10 mg total) by mouth every 6 (six) hours as needed (as needed for muscle spasms). 30 tablet 0   diclofenac sodium (VOLTAREN) 1 % GEL Apply topically to affected area qid 100 g 2   doxycycline (VIBRA-TABS) 100 MG tablet Take 1 tablet (100 mg total) by mouth 2 (two) times daily for 10  days. 20 tablet 0   Ginger, Zingiber officinalis, (GINGER ROOT) 550 MG CAPS Take by mouth.     ipratropium (ATROVENT) 0.03 % nasal spray Place 2 sprays into both nostrils every 12 (twelve) hours. 30 mL 12   lisinopril (ZESTRIL) 5 MG tablet Take 1 tablet (5 mg total) by mouth daily. 90 tablet 3   nystatin cream (MYCOSTATIN) APPLY 1 APPLICATION TOPICALLY TO RASH IN GROIN TWICE DAILY FOR UP TO 2 WEEKS MAXIMUM 90 g 2   omeprazole (PRILOSEC) 20 MG capsule Take 1 capsule (20 mg total) by mouth 2 (two) times daily before a meal. 180 capsule 3   ondansetron (ZOFRAN-ODT) 4 MG disintegrating tablet TAKE 1 TABLET BY MOUTH EVERY 8 HOURS AS NEEDED FOR NAUSEA AND VOMITING 20 tablet 1   OVER THE COUNTER MEDICATION Cherry extract bid     OVER THE COUNTER MEDICATION B-12 one daily     sertraline (ZOLOFT) 50 MG tablet TAKE 1  TABLET BY MOUTH EVERY DAY 90 tablet 1   simvastatin (ZOCOR) 20 MG tablet TAKE 1 TABLET BY MOUTH EVERYDAY AT BEDTIME 90 tablet 1   Sodium Bicarbonate POWD by Does not apply route.     traMADol (ULTRAM) 50 MG tablet Take 1 tablet (50 mg total) by mouth every 6 (six) hours as needed for moderate pain or severe pain. 60 tablet 0   triamcinolone cream (KENALOG) 0.1 % Apply 1 application topically 2 (two) times daily. For 7-10 days for recurring eczema issues 80 g 1   TURMERIC PO Take by mouth.     valACYclovir (VALTREX) 1000 MG tablet Take 2 pills twice a day for 1 day at first sign of cold sore 28 tablet 1   metoCLOPramide (REGLAN) 5 MG tablet Take 1 tablet (5 mg total) by mouth 3 (three) times daily as needed for nausea. 90 tablet 0   No current facility-administered medications for this visit.     Objective:  BP 120/70   Pulse 72   Temp 98.2 F (36.8 C)   Ht '5\' 8"'$  (1.727 m)   Wt 264 lb 6.4 oz (119.9 kg)   SpO2 94%   BMI 40.20 kg/m  Gen: NAD, resting comfortably CV: RRR no murmurs rubs or gallops Lungs: CTAB no crackles, wheeze, rhonchi Abdomen: soft/nontender/nondistended/normal bowel sounds. No rebound or guarding.  Ext: no edema Skin: warm, dry     Assessment and Plan   # Chronic nausea/constipation S: Patient trawled Ozempic in October 2023-she reported by MyChart after increasing to 0.5 mg nausea issues and reduced to 0.25 mg and despite that ended up having the following"I have had a terrible reaction with Ozempic. I was on vacation at [facility] and started vomiting and gas pains like I was in labor. On Sunday I started passing bright red blood with bowel movements. I drove back yesterday. I have not eaten anything solid for 2 days.I am not going to continue taking this." - she since stopping has not had any more blood but has continued with similar nausea and then worsened with keflex recently (said about twice as bad). She is trying 5 prunes and 2 tangerines and drinking  lost of water- still needs suppository and 45 minutes rest to allow for bowel movement. Has tried an herbal laxative she tries at times and within 2 days usually will help. Did have updated colonoscopy November 2023 which was reassuring. Ondansetron does help the nausea /tones it down enough to tolerate it at least. We also switched to doxycycline and  that seemed to help.  -reports left ear pain mild - wants me to recheck  but has a few days left   Has GI consult April 11th.  A/P: Patient with history of intermittent nausea for years and was on metoclopramide back to 2009 (suspect possible gastroparesis) at least in record but with recent worsening of nausea after Ozempic use back in [place] remained off Ozempic.  Had colonoscopy in November 2024 after blood in the stool-I do not think she needs another as she lacks recurrence of this symptom.  Benign abdominal exam today and has noted no blood in the stool or melena reported.  She also had recent otitis media which appears to be improving-still on doxycycline-encouraged her to finish course  From AVS "  Patient Instructions  With ongoing nausea issues after ozempic- suspect ozempic may have slowed your gut- with history of needing reglan/metoclopramide in the past- lets try that again - try 3x a day with meals for a week then try twice a day (2 biggest meals), then perhaps just with biggest meal for a week- that should get [name] closer to GI visit to get their opinion and hopefully help with daily nausea  Left ear looks much better- finish antibiotics  Recommended follow up: Return for as needed for new, worsening, persistent symptoms. "  # CKD stage IIII #Unilateral kidney/microalbuminuria S: Compliant with lisinopril 2.5 mg tablets-takes 2 at a time)- primarily on for renal protective effects with history of microalbuminuria  A/P: Recent GFR at 45-for unilateral kidney discussed this is excellent-CKD stage III stable-continue current medication  with lisinopril.  Offered updating microalbumin creatinine ratio but she declines for now.  At next visit update CMP  #fatty liver- noted on abdominal ultrasound July 2023- LFTs have been ok- knows importance of weight loss-we discussed this again today.  She continues to avoid alcohol but is struggling with weight loss.  Encouraged need for healthy eating, regular exercise, weight loss.  Obviously Ozempic is not a good choice for her  Recommended follow up: Return for as needed for new, worsening, persistent symptoms. Future Appointments  Date Time Provider Anton Ruiz  03/15/2023 11:30 AM Willia Craze, NP [facility]  04/27/2023  1:00 PM Marin Olp, MD [facility]  06/21/2023  1:00 PM [facility] HEALTH COACH [facility]    Lab/Order associations:   ICD-10-CM   1. Chronic nausea  R11.0     2. Stage 3a chronic kidney disease (HCC) Chronic N18.31     3. Fatty liver  K76.0     4. S/p nephrectomy  Z90.5       Meds ordered this encounter  Medications   metoCLOPramide (REGLAN) 5 MG tablet    Sig: Take 1 tablet (5 mg total) by mouth 3 (three) times daily as needed for nausea.    Dispense:  90 tablet    Refill:  0   Return precautions advised.  Garret Reddish, MD

## 2023-02-13 NOTE — Patient Instructions (Addendum)
With ongoing nausea issues after ozempic- suspect ozempic may have slowed your gut- with history of needing reglan/metoclopramide in the past- lets try that again - try 3x a day with meals for a week then try twice a day (2 biggest meals), then perhaps just with biggest meal for a week- that should get [name] closer to GI visit to get their opinion and hopefully help with daily nausea  Left ear looks much better- finish antibiotics  Recommended follow up: Return for as needed for new, worsening, persistent symptoms.

## 2023-02-18 ENCOUNTER — Encounter: Payer: Self-pay | Admitting: Family Medicine

## 2023-02-20 ENCOUNTER — Other Ambulatory Visit: Payer: Self-pay | Admitting: Nurse Practitioner

## 2023-02-20 DIAGNOSIS — J209 Acute bronchitis, unspecified: Secondary | ICD-10-CM

## 2023-03-15 ENCOUNTER — Encounter: Payer: Self-pay | Admitting: Nurse Practitioner

## 2023-03-15 ENCOUNTER — Ambulatory Visit: Payer: Medicare HMO | Admitting: Nurse Practitioner

## 2023-03-15 VITALS — BP 112/72 | HR 81 | Ht 68.0 in | Wt 268.4 lb

## 2023-03-15 DIAGNOSIS — R110 Nausea: Secondary | ICD-10-CM | POA: Diagnosis not present

## 2023-03-15 NOTE — Patient Instructions (Signed)
_______________________________________________________  If your blood pressure at your visit was 140/90 or greater, please contact your primary care physician to follow up on this. _______________________________________________________  If you are age 76 or older, your body mass index should be between 23-30. Your Body mass index is 40.81 kg/m. If this is out of the aforementioned range listed, please consider follow up with your Primary Care Provider. ________________________________________________________  The [facility] providers would like to encourage you to use [facility] to communicate with providers for non-urgent requests or questions.  Due to long hold times on the telephone, sending your provider a message by [facility] may be a faster and more efficient way to get a response.  Please allow 48 business hours for a response.  Please remember that this is for non-urgent requests.  _______________________________________________________  Angie Velasquez have been scheduled for an endoscopy. Please follow written instructions given to you at your visit today. If you use inhalers (even only as needed), please bring them with you on the day of your procedure.  Due to recent changes in healthcare laws, you may see the results of your imaging and laboratory studies on MyChart before your provider has had a chance to review them.  We understand that in some cases there may be results that are confusing or concerning to you. Not all laboratory results come back in the same time frame and the provider may be waiting for multiple results in order to interpret others.  Please give [name] 48 hours in order for your provider to thoroughly review all the results before contacting the office for clarification of your results.   Thank you for entrusting me with your care and choosing [facility].  Willette Cluster, NP

## 2023-03-15 NOTE — Progress Notes (Signed)
Primary GI:  Yancey Flemings, MD    Assessment   77 y.o. yo female with the following:   Several month history of nausea (without vomiting) improved but not alleviated with eating. Rule out PUD   Hepatic steatosis.  Normal LFTs.    Plan   -Schedule for EGD. The risks and benefits of EGD with possible biopsies were discussed with the patient who agrees to proceed.  -Discussed weight loss for steatosis. She doesn't consume Etoh.  History of Present Illness   Chief complaint:  nausea   Angie Velasquez is a 77 y.o. female with a past medical history of colon polyps, obesity, gout, R nephrectomy ( ? Malignancy),  hepatic steatosis,  GERD. See PMH / PSH for additional details.   Angie Velasquez was last seen at time of surveillance colonoscopy in November 2024  Interval History:  Angie Velasquez started Ozempic in the fall. After a dose increase she developed nausea and vomiting . Despite discontinuation of Ozempic in November the nausea has persisted though she hasn't had any further vomiting. She hasn't started any other medications to account for the nausea.   She is nauseated every day. Generally eating helps relieve nausea.   She hasn't had any associated weight loss. PCP gave her Reglan TID which helps but doesn't alleviate symptoms.  No NSAID use.   She complains of feeling gassy.  Her bowel movement are at baseline consisting of generally formed stools with occasional diarrhea. She has lower abdominal burning with meals sometimes . She has a history of GERD, symptoms controlled with BID PPI  Nov 2023  CBC normal.  Cr 1.22, liver chemistries normal.     Imaging   -Continue low dose Reglan TID as needed. Discussed potential side effects such as involuntary muscle movement, facial twitching, lip smacking.   - July 2023 RUQ [name] for right abdominal pain 1. Coarsened increased liver echotexture compatible with hepatic steatosis. 2. No abnormality at the site of right upper quadrant pain  and bulging. No evidence of hernia. If further evaluation is desired, CT could be considered. 3. Expected postsurgical dilatation of the common bile duct after cholecystectomy.  07/12/22 CT scan without-contrast for right flank pain There is no evidence of intestinal obstruction or pneumoperitoneum. There is no hydronephrosis.Diverticulosis of colon without signs of diverticulitis. Scattered coronary artery calcifications are seen. Previous right nephrectomy.Small hiatal hernia. Lumbar spondylosis.   Labs:     Labs:     Latest Ref Rng & Units 10/18/2022    8:34 AM 08/22/2022   11:35 AM 05/04/2020    2:41 PM  Hepatic Function  Total Protein 6.0 - 8.3 g/dL 6.4  7.0  6.5   Albumin 3.5 - 5.2 g/dL 4.3  4.2  4.6   AST 0 - 37 U/L 16  21  20    ALT 0 - 35 U/L 17  21  22    Alk Phosphatase 39 - 117 U/L 101  100  104   Total Bilirubin 0.2 - 1.2 mg/dL 0.3  0.3  0.2        Latest Ref Rng & Units 10/18/2022    8:34 AM 08/22/2022   11:35 AM 08/28/2019    8:03 AM  CBC  WBC 4.0 - 10.5 K/uL 6.9  7.1  7.1   Hemoglobin 12.0 - 15.0 g/dL 69.6  29.5  28.4   Hematocrit 36.0 - 46.0 % 39.6  38.7  40.3   Platelets 150.0 - 400.0 K/uL 233.0  212.0  242  Past Medical History:  Diagnosis Date   Anxiety    years ago - no longer an issue   Arthritis    gout   Cataracts, bilateral    Complication of anesthesia    Fracture of pelvis    GERD (gastroesophageal reflux disease)    Hx of chest pain    "anxiety"   Hyperlipidemia    INSOMNIA 10/14/2008   in past   Joint pain    Kidney problem    Low blood sugar    eats several smaller meals a day   Obesity    Palpitations    Stress related    Proteinuria    H/O   Tobacco abuse    quit smoking 08/2013    Past Surgical History:  Procedure Laterality Date   ABDOMINAL HYSTERECTOMY     APPENDECTOMY     with hysterectomy   childbirth     x4   CHOLECYSTECTOMY  2001   EYE SURGERY Bilateral 2015   cataract surgery with lens implant    NEPHRECTOMY  1992   complex cystic mass - nephrectomy right   ORIF ANKLE FRACTURE Right 07/02/2014   Procedure: OPEN REDUCTION INTERNAL FIXATION (ORIF) RIGHT ANKLE BIMALLOELAR FRACTURE;  Surgeon: Toni Arthurs, MD;  Location: [facility] OR;  Service: Orthopedics;  Laterality: Right;   OTHER SURGICAL HISTORY     hysterectomy, R ovary remains   SHOULDER SURGERY     bone spurs left    Current Medications, Allergies, Family History and Social History were reviewed in [employer] record.     Current Outpatient Medications  Medication Sig Dispense Refill   albuterol (VENTOLIN HFA) 108 (90 Base) MCG/ACT inhaler      albuterol (VENTOLIN HFA) 108 (90 Base) MCG/ACT inhaler Inhale 2 puffs into the lungs every 6 (six) hours as needed for wheezing or shortness of breath. 8 g 0   allopurinol (ZYLOPRIM) 300 MG tablet Take 1 tablet (300 mg total) by mouth 2 (two) times daily. 180 tablet 3   CALCIUM-MAGNESIUM-ZINC PO Take by mouth.     clonazePAM (KLONOPIN) 1 MG tablet TAKE 0.5- 1 TABLET(0.5 MG- 1 mg) BY MOUTH AT BEDTIME AS NEEDED. Do not drive for at least 8 hours after taking 90 tablet 1   CRANBERRY PO Take by mouth.     diazepam (VALIUM) 10 MG tablet Take 1 tablet (10 mg total) by mouth every 6 (six) hours as needed (as needed for muscle spasms). 30 tablet 0   diclofenac sodium (VOLTAREN) 1 % GEL Apply topically to affected area qid 100 g 2   Ginger, Zingiber officinalis, (GINGER ROOT) 550 MG CAPS Take by mouth.     ipratropium (ATROVENT) 0.03 % nasal spray Place 2 sprays into both nostrils every 12 (twelve) hours. 30 mL 12   lisinopril (ZESTRIL) 5 MG tablet Take 1 tablet (5 mg total) by mouth daily. 90 tablet 3   metoCLOPramide (REGLAN) 5 MG tablet Take 1 tablet (5 mg total) by mouth 3 (three) times daily as needed for nausea. 90 tablet 0   nystatin cream (MYCOSTATIN) APPLY 1 APPLICATION TOPICALLY TO RASH IN GROIN TWICE DAILY FOR UP TO 2 WEEKS MAXIMUM 90 g 2   omeprazole (PRILOSEC) 20 MG  capsule Take 1 capsule (20 mg total) by mouth 2 (two) times daily before a meal. 180 capsule 3   ondansetron (ZOFRAN-ODT) 4 MG disintegrating tablet TAKE 1 TABLET BY MOUTH EVERY 8 HOURS AS NEEDED FOR NAUSEA AND VOMITING 20 tablet 1  OVER THE COUNTER MEDICATION Cherry extract bid     OVER THE COUNTER MEDICATION B-12 one daily     sertraline (ZOLOFT) 50 MG tablet TAKE 1 TABLET BY MOUTH EVERY DAY 90 tablet 1   simvastatin (ZOCOR) 20 MG tablet TAKE 1 TABLET BY MOUTH EVERYDAY AT BEDTIME 90 tablet 1   Sodium Bicarbonate POWD by Does not apply route.     traMADol (ULTRAM) 50 MG tablet Take 1 tablet (50 mg total) by mouth every 6 (six) hours as needed for moderate pain or severe pain. 60 tablet 0   triamcinolone cream (KENALOG) 0.1 % Apply 1 application topically 2 (two) times daily. For 7-10 days for recurring eczema issues 80 g 1   TURMERIC PO Take by mouth.     valACYclovir (VALTREX) 1000 MG tablet Take 2 pills twice a day for 1 day at first sign of cold sore 28 tablet 1   No current facility-administered medications for this visit.    Review of Systems: No chest pain. No shortness of breath. No urinary complaints.    Physical Exam  Wt Readings from Last 3 Encounters:  03/15/23 268 lb 6 oz (121.7 kg)  02/13/23 264 lb 6.4 oz (119.9 kg)  01/29/23 264 lb (119.7 kg)    BP 112/72   Pulse 81   Ht 5\' 8"  (1.727 m)   Wt 268 lb 6 oz (121.7 kg)   BMI 40.81 kg/m  Constitutional:  Pleasant, generally well appearing female in no acute distress. Psychiatric: Normal mood and affect. Behavior is normal. EENT: Pupils normal.  Conjunctivae are normal. No scleral icterus. Neck supple.  Cardiovascular: Normal rate, regular rhythm.  Pulmonary/chest: Effort normal and breath sounds normal. No wheezing, rales or rhonchi. Abdominal: Soft, nondistended, nontender. Bowel sounds active throughout. There are no masses palpable. No hepatomegaly. Neurological: Alert and oriented to person place and time.   Skin: Skin is warm and dry. No rashes noted.  Willette ClusterPaula Lloyd Cullinan, NP  03/15/2023, 11:39 AM  Cc:  Shelva MajesticHunter, Stephen O, MD

## 2023-03-19 ENCOUNTER — Encounter: Payer: Self-pay | Admitting: Family Medicine

## 2023-03-20 ENCOUNTER — Encounter: Payer: Self-pay | Admitting: Nurse Practitioner

## 2023-03-20 DIAGNOSIS — M26622 Arthralgia of left temporomandibular joint: Secondary | ICD-10-CM | POA: Diagnosis not present

## 2023-03-20 DIAGNOSIS — H9202 Otalgia, left ear: Secondary | ICD-10-CM | POA: Diagnosis not present

## 2023-03-20 NOTE — Telephone Encounter (Signed)
Spoke with pt. States she is filling better as far as UTI goes but will call back shortly to schedule an office visit.

## 2023-03-20 NOTE — Telephone Encounter (Signed)
Please schedule ov with available provider for UTI symptoms.

## 2023-03-20 NOTE — Progress Notes (Signed)
Noted  

## 2023-03-21 ENCOUNTER — Encounter: Payer: Self-pay | Admitting: Family Medicine

## 2023-03-21 NOTE — Telephone Encounter (Signed)
Called pt and recommended she call insurance to see which providers are in network with her plan. Pt states she has already discussed this with insurance. I informed her that at this time that is the most we could help with. Pt verbalized understanding. She wants to thank Dr. Durene Cal.

## 2023-03-22 ENCOUNTER — Telehealth: Payer: Self-pay | Admitting: Nurse Practitioner

## 2023-03-22 NOTE — Telephone Encounter (Signed)
PT scheduled for colonoscopy and told that her insurance was not in network. I do not see a pre certification that has been done yet. She is requesting a call back to discuss if she can still have this procedure done.

## 2023-03-29 ENCOUNTER — Encounter: Payer: Medicare HMO | Admitting: Internal Medicine

## 2023-04-02 ENCOUNTER — Other Ambulatory Visit: Payer: Self-pay

## 2023-04-02 DIAGNOSIS — R110 Nausea: Secondary | ICD-10-CM

## 2023-04-02 NOTE — Telephone Encounter (Signed)
Patient has been rescheduled for 5/30

## 2023-04-03 ENCOUNTER — Other Ambulatory Visit: Payer: Self-pay

## 2023-04-03 NOTE — Telephone Encounter (Signed)
New instructions mailed to the patient. 

## 2023-04-04 ENCOUNTER — Other Ambulatory Visit: Payer: Self-pay

## 2023-04-04 DIAGNOSIS — R739 Hyperglycemia, unspecified: Secondary | ICD-10-CM

## 2023-04-04 DIAGNOSIS — E785 Hyperlipidemia, unspecified: Secondary | ICD-10-CM

## 2023-04-04 DIAGNOSIS — N1831 Chronic kidney disease, stage 3a: Secondary | ICD-10-CM

## 2023-04-04 DIAGNOSIS — M1A9XX0 Chronic gout, unspecified, without tophus (tophi): Secondary | ICD-10-CM

## 2023-04-23 ENCOUNTER — Other Ambulatory Visit (INDEPENDENT_AMBULATORY_CARE_PROVIDER_SITE_OTHER): Payer: Medicare HMO

## 2023-04-23 DIAGNOSIS — R739 Hyperglycemia, unspecified: Secondary | ICD-10-CM | POA: Diagnosis not present

## 2023-04-23 DIAGNOSIS — N1831 Chronic kidney disease, stage 3a: Secondary | ICD-10-CM | POA: Diagnosis not present

## 2023-04-23 DIAGNOSIS — M1A9XX0 Chronic gout, unspecified, without tophus (tophi): Secondary | ICD-10-CM | POA: Diagnosis not present

## 2023-04-23 DIAGNOSIS — E785 Hyperlipidemia, unspecified: Secondary | ICD-10-CM

## 2023-04-23 LAB — COMPREHENSIVE METABOLIC PANEL
ALT: 25 U/L (ref 0–35)
AST: 22 U/L (ref 0–37)
Albumin: 4.2 g/dL (ref 3.5–5.2)
Alkaline Phosphatase: 98 U/L (ref 39–117)
BUN: 19 mg/dL (ref 6–23)
CO2: 25 mEq/L (ref 19–32)
Calcium: 9.1 mg/dL (ref 8.4–10.5)
Chloride: 104 mEq/L (ref 96–112)
Creatinine, Ser: 1.18 mg/dL (ref 0.40–1.20)
GFR: 45.06 mL/min — ABNORMAL LOW (ref >60.00)
Glucose, Bld: 110 mg/dL — ABNORMAL HIGH (ref 70–99)
Potassium: 4 mEq/L (ref 3.5–5.1)
Sodium: 141 mEq/L (ref 135–145)
Total Bilirubin: 0.4 mg/dL (ref 0.2–1.2)
Total Protein: 6.4 g/dL (ref 6.0–8.3)

## 2023-04-23 LAB — LDL CHOLESTEROL, DIRECT: Direct LDL: 104 mg/dL

## 2023-04-23 LAB — HEMOGLOBIN A1C: Hgb A1c MFr Bld: 6.1 % (ref 4.6–6.5)

## 2023-04-23 LAB — URIC ACID: Uric Acid, Serum: 3.7 mg/dL (ref 2.4–7.0)

## 2023-04-27 ENCOUNTER — Ambulatory Visit (INDEPENDENT_AMBULATORY_CARE_PROVIDER_SITE_OTHER): Payer: Medicare HMO | Admitting: Family Medicine

## 2023-04-27 ENCOUNTER — Encounter: Payer: Self-pay | Admitting: Family Medicine

## 2023-04-27 VITALS — BP 126/72 | HR 80 | Temp 98.5°F | Ht 68.0 in | Wt 267.2 lb

## 2023-04-27 DIAGNOSIS — R739 Hyperglycemia, unspecified: Secondary | ICD-10-CM

## 2023-04-27 DIAGNOSIS — J449 Chronic obstructive pulmonary disease, unspecified: Secondary | ICD-10-CM

## 2023-04-27 DIAGNOSIS — M1A9XX0 Chronic gout, unspecified, without tophus (tophi): Secondary | ICD-10-CM

## 2023-04-27 DIAGNOSIS — R110 Nausea: Secondary | ICD-10-CM | POA: Diagnosis not present

## 2023-04-27 DIAGNOSIS — N183 Chronic kidney disease, stage 3 unspecified: Secondary | ICD-10-CM

## 2023-04-27 MED ORDER — ROSUVASTATIN CALCIUM 10 MG PO TABS
10.0000 mg | ORAL_TABLET | Freq: Every day | ORAL | 5 refills | Status: DC
Start: 2023-04-27 — End: 2023-07-25

## 2023-04-27 MED ORDER — ONDANSETRON 4 MG PO TBDP: ORAL_TABLET | ORAL | 3 refills | Status: DC

## 2023-04-27 NOTE — Progress Notes (Signed)
Phone [phone] In person visit   Subjective:   Angie Velasquez is a 77 y.o. year old very pleasant female patient who presents for/with See problem oriented charting Chief Complaint  Patient presents with   Medical Management of Chronic Issues   Hyperlipidemia   Nausea    Pt c/o nausea continues and loose stools and has endoscopy next week.    Past Medical History-  Patient Active Problem List   Diagnosis Date Noted   Depression 12/19/2016    Priority: High   S/p nephrectomy 03/12/2015    Priority: High   Anxiety state 12/09/2009    Priority: High   Fatty liver 06/12/2022    Priority: Medium    CKD (chronic kidney disease), stage III (HCC) 02/14/2019    Priority: Medium    Prediabetes 10/22/2017    Priority: Medium    Hyperglycemia 09/18/2017    Priority: Medium    Gout 03/12/2015    Priority: Medium    Hyperlipidemia 03/12/2015    Priority: Medium    COPD (chronic obstructive pulmonary disease) (HCC) 03/12/2015    Priority: Medium    Arthritis     Priority: Medium    Former smoker     Priority: Medium    Vitamin D deficiency 05/28/2009    Priority: Medium    Hematuria, unspecified 05/26/2009    Priority: Medium    Hearing loss 01/20/2009    Priority: Medium    MICROALBUMINURIA 01/23/2008    Priority: Medium    Osteoarthritis of spine with radiculopathy, cervical region 02/12/2018    Priority: Low   Intertrigo 01/10/2018    Priority: Low   History of osteitis pubis 12/19/2017    Priority: Low   Eczema 06/29/2015    Priority: Low   Tinnitus 03/12/2015    Priority: Low   History of colonic polyps 03/12/2015    Priority: Low   Bimalleolar fracture 07/02/2014    Priority: Low   Arthropathy 05/26/2009    Priority: Low   Insomnia, unspecified 04/27/2009    Priority: Low   DDD (degenerative disc disease), lumbosacral 01/20/2009    Priority: Low   Sialoadenitis 01/20/2009    Priority: Low   GERD 01/23/2008    Priority: Low    Microscopic hematuria 01/23/2008    Priority: Low   UTI (urinary tract infection) 01/08/2008    Priority: Low   Snoring 01/06/2020   Atypical chest pain 03/26/2017   Contact dermatitis and other eczema, due to unspecified cause 05/26/2009    Medications- reviewed and updated Current Outpatient Medications  Medication Sig Dispense Refill   albuterol (VENTOLIN HFA) 108 (90 Base) MCG/ACT inhaler      albuterol (VENTOLIN HFA) 108 (90 Base) MCG/ACT inhaler Inhale 2 puffs into the lungs every 6 (six) hours as needed for wheezing or shortness of breath. 8 g 0   allopurinol (ZYLOPRIM) 300 MG tablet Take 1 tablet (300 mg total) by mouth 2 (two) times daily. 180 tablet 3   CALCIUM-MAGNESIUM-ZINC PO Take by mouth.     clonazePAM (KLONOPIN) 1 MG tablet TAKE 0.5- 1 TABLET(0.5 MG- 1 mg) BY MOUTH AT BEDTIME AS NEEDED. Do not drive for at least 8 hours after taking 90 tablet 1   CRANBERRY PO Take by mouth.     diazepam (VALIUM) 10 MG tablet Take 1 tablet (10 mg total) by mouth every 6 (six) hours as needed (as needed for muscle spasms). 30 tablet 0   diclofenac sodium (VOLTAREN) 1 % GEL Apply topically to affected  area qid 100 g 2   Ginger, Zingiber officinalis, (GINGER ROOT) 550 MG CAPS Take by mouth.     ipratropium (ATROVENT) 0.03 % nasal spray Place 2 sprays into both nostrils every 12 (twelve) hours. 30 mL 12   lisinopril (ZESTRIL) 5 MG tablet Take 1 tablet (5 mg total) by mouth daily. 90 tablet 3   nystatin cream (MYCOSTATIN) APPLY 1 APPLICATION TOPICALLY TO RASH IN GROIN TWICE DAILY FOR UP TO 2 WEEKS MAXIMUM 90 g 2   omeprazole (PRILOSEC) 20 MG capsule Take 1 capsule (20 mg total) by mouth 2 (two) times daily before a meal. 180 capsule 3   OVER THE COUNTER MEDICATION Cherry extract bid     OVER THE COUNTER MEDICATION B-12 one daily     rosuvastatin (CRESTOR) 10 MG tablet Take 1 tablet (10 mg total) by mouth daily. 30 tablet 5   sertraline (ZOLOFT) 50 MG tablet TAKE 1 TABLET BY MOUTH EVERY DAY 90  tablet 1   Sodium Bicarbonate POWD by Does not apply route.     traMADol (ULTRAM) 50 MG tablet Take 1 tablet (50 mg total) by mouth every 6 (six) hours as needed for moderate pain or severe pain. 60 tablet 0   triamcinolone cream (KENALOG) 0.1 % Apply 1 application topically 2 (two) times daily. For 7-10 days for recurring eczema issues 80 g 1   TURMERIC PO Take by mouth.     valACYclovir (VALTREX) 1000 MG tablet Take 2 pills twice a day for 1 day at first sign of cold sore 28 tablet 1   ondansetron (ZOFRAN-ODT) 4 MG disintegrating tablet TAKE 1 TABLET BY MOUTH EVERY 8 HOURS AS NEEDED FOR NAUSEA AND VOMITING 20 tablet 3   No current facility-administered medications for this visit.     Objective:  BP 126/72   Pulse 80   Temp 98.5 F (36.9 C)   Ht 5\' 8"  (1.727 m)   Wt 267 lb 3.2 oz (121.2 kg)   SpO2 96%   BMI 40.63 kg/m  Gen: NAD, resting comfortably CV: RRR no murmurs rubs or gallops Lungs: CTAB no crackles, wheeze, rhonchi Abdomen: soft/mild right abdominal pain w deep palpation/nondistended/normal bowel sounds. No rebound or guarding.  Ext: no edema Skin: warm, dry     Assessment and Plan   # Nausea S: Patient has recently seen gastroenterology due to several month history of nausea without vomiting.  Improved but not alleviated with eating.  Concern for peptic ulcer disease-currently scheduled for EGD in approximately a week with potential biopsies -Last visit patient reported worsening nausea after taking Keflex - She also previously reported constipation and she was trying 5 prunes and 2 tangerine today as well as drinking a lot of water but still needing suppository at x 45 minutes to have a good bowel movement.  She was up-to-date on colonoscopy from November 2023 -She has a history of intermittent nausea for years and was on Reglan back in 2009 with concern for possible gastroparesis.  Nausea seem to worsen after trial of Ozempic. -At last visit we opted to retry  metoclopramide/Reglan- no longer having constipation- seems to be pretty regular though looser now (has had to use imodium)- needs pads and wipes and night-time to go quickly to bathroom. Takes reglan once a day and seems to help some. Has pulled back on fruit to make sure doesn't have loose stool.  A/P: Nausea improved with regland BUT caused looser stools/diarrhea. We opted to stop the reglan and restart prior ondansetron  to try to reduce this.  -also with upcoming EGD and can ask Dr. Marina Goodell for his opinion  -sertraline could contribute to nausea-26% chance to cause per up to date    #Hyperlipidemia with calcium on CT coronary artery calcifications S: Compliant with simvastatin 20 mg Lab Results  Component Value Date   CHOL 189 10/18/2022   HDL 53.60 10/18/2022   LDLCALC 112 (H) 05/04/2020   LDLDIRECT 104.0 04/23/2023   TRIG 228.0 (H) 10/18/2022   CHOLHDL 4 10/18/2022   A/P: Cholesterol above ideal goal-discussed option of increasing to simvastatin 40 mg but she worries about potential memory loss risk with fathers history and would prefer to change to rosuvastatin 10mg   #Left ear pain- did not have good experience with [facility]- was told TMJ (feels different than when she had this in past)-she feels more sinus related- if gets sinuses to drain feels better. Wants to hold off on further eval  #Unilateral kidney/microalbuminuria S: Compliant with lisinopril 5mg - primarily on for renal protective effects with history of microalbuminuria  A/P: Thankfully renal function is stable with GFR in the mid 40s-continue to monitor  #Gout S:medication(s): allopurinol 300 mg twice daily Lab Results  Component Value Date   LABURIC 3.7 04/23/2023  A/P: doing well without flares- continue current medications - uric acid at goal   # COPD  S: radiographic finding only- asymptomatic- very mildly feels mildly shortness of breath- will monitor- worse if gets illness. Quit smoking 11 years  ago Maintenance medications: None A/P: for most part doing well without medications- continue to monitor   # Hyperglycemia/insulin resistance/prediabetes S:  Medication: none Exercise and diet- trying to eat healthier and stomach issues bothering her, trying to walk more Lab Results  Component Value Date   HGBA1C 6.1 04/23/2023   HGBA1C 6.3 08/22/2022   HGBA1C 5.9 (H) 05/04/2020  A/P:  stable- continue without medication(s) - work on mild weight loss   #Depression-full remission/anxiety S: meds: sertraline 50 mg    02/13/2023   11:18 AM 10/20/2022    2:08 PM 06/02/2022    1:06 PM  Depression screen PHQ 2/9  Decreased Interest 0 0 0  Down, Depressed, Hopeless 1 0 1  PHQ - 2 Score 1 0 1  Altered sleeping 0 0   Tired, decreased energy 1 0   Change in appetite 0 0   Feeling bad or failure about yourself  0 0   Trouble concentrating 0 0   Moving slowly or fidgety/restless 0 0   Suicidal thoughts 0 0   PHQ-9 Score 2 0   Difficult doing work/chores Not difficult at all Not difficult at all Not difficult at all  A/P: has been in full remission- she feel smuch better on medicine and we opted to continue sertraline even if could partially contribute to nausea   #fatty liver- noted on abdominal ultrasound July 2023- LFTs have been ok- knows importance of weight loss - has struggled with this- wants to get back to healthy weight of the wellness - does not drink- cant remember last drink Lab Results  Component Value Date   ALT 25 04/23/2023   AST 22 04/23/2023   ALKPHOS 98 04/23/2023   BILITOT 0.4 04/23/2023   Recommended follow up: No follow-ups on file. Future Appointments  Date Time Provider Department Center  05/03/2023 10:00 AM Hilarie Fredrickson, MD [facility]  06/21/2023  1:00 PM [facility] ANNUAL WELLNESS VISIT 1 [facility]   Lab/Order associations:   ICD-10-CM  1. Chronic nausea  R11.0     2. Stage 3 chronic kidney disease, unspecified whether stage 3a or 3b CKD  (HCC)  N18.30     3. Chronic obstructive pulmonary disease, unspecified COPD type (HCC)  J44.9     4. Chronic gout without tophus, unspecified cause, unspecified site  [id number].[id number]     5. Hyperglycemia  R73.9       Meds ordered this encounter  Medications   ondansetron (ZOFRAN-ODT) 4 MG disintegrating tablet    Sig: TAKE 1 TABLET BY MOUTH EVERY 8 HOURS AS NEEDED FOR NAUSEA AND VOMITING    Dispense:  20 tablet    Refill:  3   rosuvastatin (CRESTOR) 10 MG tablet    Sig: Take 1 tablet (10 mg total) by mouth daily.    Dispense:  30 tablet    Refill:  5    Return precautions advised.  Tana Conch, MD

## 2023-04-27 NOTE — Patient Instructions (Addendum)
Nausea improved with reglan BUT caused looser stools/diarrhea. We opted to stop the reglan and restart prior ondansetron to try to reduce this.  -also with upcoming EGD and can ask Dr. Marina Goodell for his opinion   -change to rosuvastatin 10 mg -stop your simvastatin 20 mg (could finish what you have first)  Recommended follow up: Return in about 6 months (around 10/28/2023) for physical or sooner if needed.Schedule b4 you leave.

## 2023-04-29 ENCOUNTER — Other Ambulatory Visit: Payer: Self-pay | Admitting: Family Medicine

## 2023-05-03 ENCOUNTER — Ambulatory Visit (AMBULATORY_SURGERY_CENTER): Payer: Medicare HMO | Admitting: Internal Medicine

## 2023-05-03 ENCOUNTER — Encounter: Payer: Self-pay | Admitting: Internal Medicine

## 2023-05-03 ENCOUNTER — Encounter: Payer: Self-pay | Admitting: Family Medicine

## 2023-05-03 VITALS — BP 127/67 | HR 66 | Temp 97.6°F | Resp 14 | Ht 68.0 in | Wt 268.0 lb

## 2023-05-03 DIAGNOSIS — J449 Chronic obstructive pulmonary disease, unspecified: Secondary | ICD-10-CM | POA: Diagnosis not present

## 2023-05-03 DIAGNOSIS — R110 Nausea: Secondary | ICD-10-CM | POA: Diagnosis not present

## 2023-05-03 DIAGNOSIS — K317 Polyp of stomach and duodenum: Secondary | ICD-10-CM

## 2023-05-03 DIAGNOSIS — K222 Esophageal obstruction: Secondary | ICD-10-CM

## 2023-05-03 DIAGNOSIS — I10 Essential (primary) hypertension: Secondary | ICD-10-CM | POA: Diagnosis not present

## 2023-05-03 MED ORDER — SODIUM CHLORIDE 0.9 % IV SOLN
500.0000 mL | Freq: Once | INTRAVENOUS | Status: DC
Start: 2023-05-03 — End: 2023-05-03

## 2023-05-03 NOTE — Progress Notes (Signed)
Report to PACU, RN, vss, BBS= Clear.  

## 2023-05-03 NOTE — Patient Instructions (Signed)
Continue Omeprazole daily Continue Zofran as needed for nausea Return to the care of your primary provider  YOU HAD AN ENDOSCOPIC PROCEDURE TODAY: Refer to the procedure report and other information in the discharge instructions given to you for any specific questions about what was found during the examination. If this information does not answer your questions, please call [place] office at [phone] to clarify.   YOU SHOULD EXPECT: Some feelings of bloating in the abdomen. Passage of more gas than usual. Walking can help get rid of the air that was put into your GI tract during the procedure and reduce the bloating. If you had a lower endoscopy (such as a colonoscopy or flexible sigmoidoscopy) you may notice spotting of blood in your stool or on the toilet paper. Some abdominal soreness may be present for a day or two, also.  DIET: Your first meal following the procedure should be a light meal and then it is ok to progress to your normal diet. A half-sandwich or bowl of soup is an example of a good first meal. Heavy or fried foods are harder to digest and may make you feel nauseous or bloated. Drink plenty of fluids but you should avoid alcoholic beverages for 24 hours. If you had a esophageal dilation, please see attached instructions for diet.    ACTIVITY: Your care partner should take you home directly after the procedure. You should plan to take it easy, moving slowly for the rest of the day. You can resume normal activity the day after the procedure however YOU SHOULD NOT DRIVE, use power tools, machinery or perform tasks that involve climbing or major physical exertion for 24 hours (because of the sedation medicines used during the test).   SYMPTOMS TO REPORT IMMEDIATELY: A gastroenterologist can be reached at any hour. Please call [phone]  for any of the following symptoms:  Following upper endoscopy (EGD, EUS, ERCP, esophageal dilation) Vomiting of blood or coffee ground material   New, significant abdominal pain  New, significant chest pain or pain under the shoulder blades  Painful or persistently difficult swallowing  New shortness of breath  Black, tarry-looking or red, bloody stools  FOLLOW UP:  If any biopsies were taken you will be contacted by phone or by letter within the next 1-3 weeks. Call [phone]  if you have not heard about the biopsies in 3 weeks.  Please also call with any specific questions about appointments or follow up tests.

## 2023-05-03 NOTE — Progress Notes (Signed)
Primary GI:  Yancey Flemings, MD      Assessment    77 y.o. yo female with the following:    Several month history of nausea (without vomiting) improved but not alleviated with eating. Rule out PUD    Hepatic steatosis.  Normal LFTs.      Plan    -Schedule for EGD. The risks and benefits of EGD with possible biopsies were discussed with the patient who agrees to proceed.  -Discussed weight loss for steatosis. She doesn't consume Etoh.   History of Present Illness    Chief complaint:  nausea   Angie Velasquez is a 77 y.o. female with a past medical history of colon polyps, obesity, gout, R nephrectomy ( ? Malignancy),  hepatic steatosis,  GERD. See PMH / PSH for additional details.    Angie Velasquez was last seen at time of surveillance colonoscopy in November 2024   Interval History:  Angie Velasquez started Ozempic in the fall. After a dose increase she developed nausea and vomiting . Despite discontinuation of Ozempic in November the nausea has persisted though she hasn't had any further vomiting. She hasn't started any other medications to account for the nausea.   She is nauseated every day. Generally eating helps relieve nausea.   She hasn't had any associated weight loss. PCP gave her Reglan TID which helps but doesn't alleviate symptoms.  No NSAID use.   She complains of feeling gassy.  Her bowel movement are at baseline consisting of generally formed stools with occasional diarrhea. She has lower abdominal burning with meals sometimes . She has a history of GERD, symptoms controlled with BID PPI   Nov 2023  CBC normal.  Cr 1.22, liver chemistries normal.      Imaging    -Continue low dose Reglan TID as needed. Discussed potential side effects such as involuntary muscle movement, facial twitching, lip smacking.   - July 2023 RUQ [name] for right abdominal pain 1. Coarsened increased liver echotexture compatible with hepatic steatosis. 2. No abnormality at the site of right upper  quadrant pain and bulging. No evidence of hernia. If further evaluation is desired, CT could be considered. 3. Expected postsurgical dilatation of the common bile duct after cholecystectomy.   07/12/22 CT scan without-contrast for right flank pain There is no evidence of intestinal obstruction or pneumoperitoneum. There is no hydronephrosis.Diverticulosis of colon without signs of diverticulitis. Scattered coronary artery calcifications are seen. Previous right nephrectomy.Small hiatal hernia. Lumbar spondylosis.     Labs:       Labs:      Latest Ref Rng & Units 10/18/2022    8:34 AM 08/22/2022   11:35 AM 05/04/2020    2:41 PM  Hepatic Function  Total Protein 6.0 - 8.3 g/dL 6.4  7.0  6.5   Albumin 3.5 - 5.2 g/dL 4.3  4.2  4.6   AST 0 - 37 U/L 16  21  20    ALT 0 - 35 U/L 17  21  22    Alk Phosphatase 39 - 117 U/L 101  100  104   Total Bilirubin 0.2 - 1.2 mg/dL 0.3  0.3  0.2           Latest Ref Rng & Units 10/18/2022    8:34 AM 08/22/2022   11:35 AM 08/28/2019    8:03 AM  CBC  WBC 4.0 - 10.5 K/uL 6.9  7.1  7.1   Hemoglobin 12.0 - 15.0 g/dL 16.1  09.6  04.5  Hematocrit 36.0 - 46.0 % 39.6  38.7  40.3   Platelets 150.0 - 400.0 K/uL 233.0  212.0  242             Past Medical History:  Diagnosis Date   Anxiety      years ago - no longer an issue   Arthritis      gout   Cataracts, bilateral     Complication of anesthesia     Fracture of pelvis     GERD (gastroesophageal reflux disease)     Hx of chest pain      "anxiety"   Hyperlipidemia     INSOMNIA 10/14/2008    in past   Joint pain     Kidney problem     Low blood sugar      eats several smaller meals a day   Obesity     Palpitations      Stress related    Proteinuria      H/O   Tobacco abuse      quit smoking 08/2013           Past Surgical History:  Procedure Laterality Date   ABDOMINAL HYSTERECTOMY       APPENDECTOMY        with hysterectomy   childbirth        x4   CHOLECYSTECTOMY   2001   EYE  SURGERY Bilateral 2015    cataract surgery with lens implant   NEPHRECTOMY   1992    complex cystic mass - nephrectomy right   ORIF ANKLE FRACTURE Right 07/02/2014    Procedure: OPEN REDUCTION INTERNAL FIXATION (ORIF) RIGHT ANKLE BIMALLOELAR FRACTURE;  Surgeon: Toni Arthurs, MD;  Location: [facility] OR;  Service: Orthopedics;  Laterality: Right;   OTHER SURGICAL HISTORY        hysterectomy, R ovary remains   SHOULDER SURGERY        bone spurs left      Current Medications, Allergies, Family History and Social History were reviewed in [employer] record.           Current Outpatient Medications  Medication Sig Dispense Refill   albuterol (VENTOLIN HFA) 108 (90 Base) MCG/ACT inhaler         albuterol (VENTOLIN HFA) 108 (90 Base) MCG/ACT inhaler Inhale 2 puffs into the lungs every 6 (six) hours as needed for wheezing or shortness of breath. 8 g 0   allopurinol (ZYLOPRIM) 300 MG tablet Take 1 tablet (300 mg total) by mouth 2 (two) times daily. 180 tablet 3   CALCIUM-MAGNESIUM-ZINC PO Take by mouth.       clonazePAM (KLONOPIN) 1 MG tablet TAKE 0.5- 1 TABLET(0.5 MG- 1 mg) BY MOUTH AT BEDTIME AS NEEDED. Do not drive for at least 8 hours after taking 90 tablet 1   CRANBERRY PO Take by mouth.       diazepam (VALIUM) 10 MG tablet Take 1 tablet (10 mg total) by mouth every 6 (six) hours as needed (as needed for muscle spasms). 30 tablet 0   diclofenac sodium (VOLTAREN) 1 % GEL Apply topically to affected area qid 100 g 2   Ginger, Zingiber officinalis, (GINGER ROOT) 550 MG CAPS Take by mouth.       ipratropium (ATROVENT) 0.03 % nasal spray Place 2 sprays into both nostrils every 12 (twelve) hours. 30 mL 12   lisinopril (ZESTRIL) 5 MG tablet Take 1 tablet (5 mg total) by mouth daily. 90 tablet 3  metoCLOPramide (REGLAN) 5 MG tablet Take 1 tablet (5 mg total) by mouth 3 (three) times daily as needed for nausea. 90 tablet 0   nystatin cream (MYCOSTATIN) APPLY 1 APPLICATION TOPICALLY TO  RASH IN GROIN TWICE DAILY FOR UP TO 2 WEEKS MAXIMUM 90 g 2   omeprazole (PRILOSEC) 20 MG capsule Take 1 capsule (20 mg total) by mouth 2 (two) times daily before a meal. 180 capsule 3   ondansetron (ZOFRAN-ODT) 4 MG disintegrating tablet TAKE 1 TABLET BY MOUTH EVERY 8 HOURS AS NEEDED FOR NAUSEA AND VOMITING 20 tablet 1   OVER THE COUNTER MEDICATION Cherry extract bid       OVER THE COUNTER MEDICATION B-12 one daily       sertraline (ZOLOFT) 50 MG tablet TAKE 1 TABLET BY MOUTH EVERY DAY 90 tablet 1   simvastatin (ZOCOR) 20 MG tablet TAKE 1 TABLET BY MOUTH EVERYDAY AT BEDTIME 90 tablet 1   Sodium Bicarbonate POWD by Does not apply route.       traMADol (ULTRAM) 50 MG tablet Take 1 tablet (50 mg total) by mouth every 6 (six) hours as needed for moderate pain or severe pain. 60 tablet 0   triamcinolone cream (KENALOG) 0.1 % Apply 1 application topically 2 (two) times daily. For 7-10 days for recurring eczema issues 80 g 1   TURMERIC PO Take by mouth.       valACYclovir (VALTREX) 1000 MG tablet Take 2 pills twice a day for 1 day at first sign of cold sore 28 tablet 1    No current facility-administered medications for this visit.      Review of Systems: No chest pain. No shortness of breath. No urinary complaints.      Physical Exam      Wt Readings from Last 3 Encounters:  03/15/23 268 lb 6 oz (121.7 kg)  02/13/23 264 lb 6.4 oz (119.9 kg)  01/29/23 264 lb (119.7 kg)      BP 112/72   Pulse 81   Ht 5\' 8"  (1.727 m)   Wt 268 lb 6 oz (121.7 kg)   BMI 40.81 kg/m  Constitutional:  Pleasant, generally well appearing female in no acute distress. Psychiatric: Normal mood and affect. Behavior is normal. EENT: Pupils normal.  Conjunctivae are normal. No scleral icterus. Neck supple.  Cardiovascular: Normal rate, regular rhythm.  Pulmonary/chest: Effort normal and breath sounds normal. No wheezing, rales or rhonchi. Abdominal: Soft, nondistended, nontender. Bowel sounds active throughout.  There are no masses palpable. No hepatomegaly. Neurological: Alert and oriented to person place and time.  Skin: Skin is warm and dry. No rashes noted.   Willette Cluster, NP  03/15/2023, 11:39 AM   Cc:  Shelva Majestic, MD     Recent H&P as above.  No interval change.  Now for upper endoscopy to evaluate chronic nausea

## 2023-05-03 NOTE — Op Note (Signed)
[facility] Patient Name: Angie Velasquez Procedure Date: 05/03/2023 9:59 AM MRN: [id number] Endoscopist: Wilhemina Bonito. Marina Goodell , MD, [id number] Age: 77 Referring MD:  Date of Birth: 12-Jul-1946 Gender: Female Account #: [id number] Procedure:                Upper GI endoscopy Indications:              Nausea, chronic without vomiting. Symptoms began                            after initiating Ozempic. Has been off medication                            for 6 months. Medicines:                Monitored Anesthesia Care Procedure:                Pre-Anesthesia Assessment:                           - Prior to the procedure, a History and Physical                            was performed, and patient medications and                            allergies were reviewed. The patient's tolerance of                            previous anesthesia was also reviewed. The risks                            and benefits of the procedure and the sedation                            options and risks were discussed with the patient.                            All questions were answered, and informed consent                            was obtained. Prior Anticoagulants: The patient has                            taken no anticoagulant or antiplatelet agents. ASA                            Grade Assessment: II - A patient with mild systemic                            disease. After reviewing the risks and benefits,                            the patient was deemed in satisfactory condition to  undergo the procedure.                           After obtaining informed consent, the endoscope was                            passed under direct vision. Throughout the                            procedure, the patient's blood pressure, pulse, and                            oxygen saturations were monitored continuously. The                            GIF [id number] #[id number] was introduced  through the                            mouth, and advanced to the second part of duodenum.                            The upper GI endoscopy was accomplished without                            difficulty. The patient tolerated the procedure                            well. Scope In: Scope Out: Findings:                 The esophagus revealed an incidental large caliber                            distal stricture or ring at the gastroesophageal                            junction (39 cm from the incisors). No inflammation.                           The stomach revealed a very small sliding hiatal                            hernia. As well diminutive benign fundic gland type                            polyps. Otherwise normal.                           The examined duodenum was normal.                           The cardia and gastric fundus were normal on                            retroflexion. Complications:  No immediate complications. Estimated Blood Loss:     Estimated blood loss: none. Impression:               1. Distal esophageal ring or stricture                           2. Small hiatal hernia                           3. Benign fundic gland polyps                           4. Otherwise unremarkable EGD                           5. No cause for chronic nausea found. Suspect                            lingering effects of Ozempic. Recommendation:           - Patient has a contact number available for                            emergencies. The signs and symptoms of potential                            delayed complications were discussed with the                            patient. Return to normal activities tomorrow.                            Written discharge instructions were provided to the                            patient.                           - Resume previous diet.                           - Continue present medications.                            -Continue omeprazole daily                           -Continue Zofran as needed for nausea                           -Return to the care of your primary provider Wilhemina Bonito. Marina Goodell, MD 05/03/2023 10:24:53 AM This report has been signed electronically.

## 2023-05-03 NOTE — Progress Notes (Signed)
Pt's states no medical or surgical changes since previsit or office visit. 

## 2023-05-04 ENCOUNTER — Telehealth: Payer: Self-pay

## 2023-05-04 DIAGNOSIS — G4733 Obstructive sleep apnea (adult) (pediatric): Secondary | ICD-10-CM | POA: Diagnosis not present

## 2023-05-04 NOTE — Telephone Encounter (Signed)
  Follow up Call-     05/03/2023    9:16 AM 10/17/2022    9:00 AM  Call back number  Post procedure Call Back phone  # [phone]  Permission to leave phone message Yes Yes     Patient questions:  Do you have a fever, pain , or abdominal swelling? No. Pain Score  0 *  Have you tolerated food without any problems? Yes.    Have you been able to return to your normal activities? Yes.    Do you have any questions about your discharge instructions: Diet   No. Medications  No. Follow up visit  No.  Do you have questions or concerns about your Care? No.  Actions: * If pain score is 4 or above: No action needed, pain <4.

## 2023-05-11 ENCOUNTER — Encounter: Payer: Self-pay | Admitting: Family Medicine

## 2023-05-11 DIAGNOSIS — J209 Acute bronchitis, unspecified: Secondary | ICD-10-CM

## 2023-05-11 MED ORDER — ALBUTEROL SULFATE HFA 108 (90 BASE) MCG/ACT IN AERS
2.0000 | INHALATION_SPRAY | Freq: Four times a day (QID) | RESPIRATORY_TRACT | 2 refills | Status: DC | PRN
Start: 2023-05-11 — End: 2023-05-18

## 2023-05-13 ENCOUNTER — Telehealth: Payer: Medicare HMO | Admitting: Family

## 2023-05-13 DIAGNOSIS — J441 Chronic obstructive pulmonary disease with (acute) exacerbation: Secondary | ICD-10-CM

## 2023-05-13 MED ORDER — BENZONATATE 100 MG PO CAPS
100.0000 mg | ORAL_CAPSULE | Freq: Three times a day (TID) | ORAL | 0 refills | Status: DC | PRN
Start: 2023-05-13 — End: 2023-06-26

## 2023-05-13 MED ORDER — DOXYCYCLINE HYCLATE 100 MG PO TABS
100.0000 mg | ORAL_TABLET | Freq: Two times a day (BID) | ORAL | 0 refills | Status: DC
Start: 2023-05-13 — End: 2023-05-18

## 2023-05-13 MED ORDER — PREDNISONE 20 MG PO TABS
40.0000 mg | ORAL_TABLET | Freq: Every day | ORAL | 0 refills | Status: DC
Start: 2023-05-13 — End: 2023-05-18

## 2023-05-13 NOTE — Progress Notes (Signed)
Virtual Visit Consent   Angie Velasquez, you are scheduled for a virtual visit with a [facility] provider today. Just as with appointments in the office, your consent must be obtained to participate. Your consent will be active for this visit and any virtual visit you may have with one of our providers in the next 365 days. If you have a MyChart account, a copy of this consent can be sent to you electronically.  As this is a virtual visit, video technology does not allow for your provider to perform a traditional examination. This may limit your provider's ability to fully assess your condition. If your provider identifies any concerns that need to be evaluated in person or the need to arrange testing (such as labs, EKG, etc.), we will make arrangements to do so. Although advances in technology are sophisticated, we cannot ensure that it will always work on either your end or our end. If the connection with a video visit is poor, the visit may have to be switched to a telephone visit. With either a video or telephone visit, we are not always able to ensure that we have a secure connection.  By engaging in this virtual visit, you consent to the provision of healthcare and authorize for your insurance to be billed (if applicable) for the services provided during this visit. Depending on your insurance coverage, you may receive a charge related to this service.  I need to obtain your verbal consent now. Are you willing to proceed with your visit today? Angie Velasquez has provided verbal consent on 05/13/2023 for a virtual visit (video or telephone). Jannifer Rodney, FNP  Date: 05/13/2023 4:52 PM  Virtual Visit via Video Note   I, Jannifer Rodney, connected with  Angie Velasquez  ([id number], July 24, 1946) on 05/13/23 at  5:00 PM EDT by a video-enabled telemedicine application and verified that I am speaking with the correct person using two  identifiers.  Location: Patient: Virtual Visit Location Patient: Home Provider: Virtual Visit Location Provider: Home Office   I discussed the limitations of evaluation and management by telemedicine and the availability of in person appointments. The patient expressed understanding and agreed to proceed.    History of Present Illness: Angie Velasquez is a 77 y.o. who identifies as a female who was assigned female at birth, and is being seen today for cough and SOB that started two day ago. Her PCP gave her an inhaler on Friday. Had a negative COVID test.   HPI: Cough This is a new problem. The current episode started in the past 7 days. The problem has been gradually worsening. The problem occurs every few minutes. The cough is Non-productive. Associated symptoms include ear pain, headaches, nasal congestion, postnasal drip, shortness of breath and wheezing. Pertinent negatives include no chills, ear congestion, fever, myalgias or sore throat. She has tried rest and OTC cough suppressant for the symptoms. The treatment provided mild relief.    Problems:  Patient Active Problem List   Diagnosis Date Noted   Fatty liver 06/12/2022   Snoring 01/06/2020   CKD (chronic kidney disease), stage III (HCC) 02/14/2019   Osteoarthritis of spine with radiculopathy, cervical region 02/12/2018   Intertrigo 01/10/2018   History of osteitis pubis 12/19/2017   Prediabetes 10/22/2017   Hyperglycemia 09/18/2017   Atypical chest pain 03/26/2017   Depression 12/19/2016   Eczema 06/29/2015   Gout 03/12/2015   S/p nephrectomy 03/12/2015   Hyperlipidemia 03/12/2015   Tinnitus 03/12/2015   COPD (  chronic obstructive pulmonary disease) (HCC) 03/12/2015   History of colonic polyps 03/12/2015   Bimalleolar fracture 07/02/2014   Arthritis    Former smoker    Anxiety state 12/09/2009   Vitamin D deficiency 05/28/2009   Hematuria, unspecified 05/26/2009   Contact dermatitis and other eczema, due  to unspecified cause 05/26/2009   Arthropathy 05/26/2009   Insomnia, unspecified 04/27/2009   DDD (degenerative disc disease), lumbosacral 01/20/2009   Hearing loss 01/20/2009   Sialoadenitis 01/20/2009   GERD 01/23/2008   MICROALBUMINURIA 01/23/2008   Microscopic hematuria 01/23/2008   UTI (urinary tract infection) 01/08/2008    Allergies:  Allergies  Allergen Reactions   Epinephrine Palpitations   Codeine Nausea Only   Ozempic (0.25 Or 0.5 Mg-Dose) [Semaglutide(0.25 Or 0.5mg -Dos)]     I have had a terrible reaction with Ozempic. I was on vacation at [facility] and started vomiting and gas pains like I was in labor. On Sunday I started passing bright red blood with bowel movements. I drove back yesterday. I have not eaten anything solid for 2 days. I am not going to continue taking this.    Penicillins Nausea Only   Prednisone Cough   Medications:  Current Outpatient Medications:    benzonatate (TESSALON PERLES) 100 MG capsule, Take 1 capsule (100 mg total) by mouth 3 (three) times daily as needed., Disp: 20 capsule, Rfl: 0   doxycycline (VIBRA-TABS) 100 MG tablet, Take 1 tablet (100 mg total) by mouth 2 (two) times daily., Disp: 20 tablet, Rfl: 0   predniSONE (DELTASONE) 20 MG tablet, Take 2 tablets (40 mg total) by mouth daily with breakfast for 5 days., Disp: 10 tablet, Rfl: 0   albuterol (VENTOLIN HFA) 108 (90 Base) MCG/ACT inhaler, Inhale 2 puffs into the lungs every 6 (six) hours as needed for wheezing or shortness of breath., Disp: 18 g, Rfl: 2   allopurinol (ZYLOPRIM) 300 MG tablet, TAKE 1 TABLET BY MOUTH 2 TIMES DAILY., Disp: 180 tablet, Rfl: 2   CALCIUM-MAGNESIUM-ZINC PO, Take by mouth., Disp: , Rfl:    clonazePAM (KLONOPIN) 1 MG tablet, TAKE 0.5- 1 TABLET(0.5 MG- 1 mg) BY MOUTH AT BEDTIME AS NEEDED. Do not drive for at least 8 hours after taking, Disp: 90 tablet, Rfl: 1   CRANBERRY PO, Take by mouth., Disp: , Rfl:    diazepam (VALIUM) 10 MG tablet, Take 1 tablet (10 mg  total) by mouth every 6 (six) hours as needed (as needed for muscle spasms)., Disp: 30 tablet, Rfl: 0   diclofenac sodium (VOLTAREN) 1 % GEL, Apply topically to affected area qid, Disp: 100 g, Rfl: 2   Ginger, Zingiber officinalis, (GINGER ROOT) 550 MG CAPS, Take by mouth., Disp: , Rfl:    ipratropium (ATROVENT) 0.03 % nasal spray, Place 2 sprays into both nostrils every 12 (twelve) hours., Disp: 30 mL, Rfl: 12   lisinopril (ZESTRIL) 5 MG tablet, Take 1 tablet (5 mg total) by mouth daily., Disp: 90 tablet, Rfl: 3   nystatin cream (MYCOSTATIN), APPLY 1 APPLICATION TOPICALLY TO RASH IN GROIN TWICE DAILY FOR UP TO 2 WEEKS MAXIMUM, Disp: 90 g, Rfl: 2   omeprazole (PRILOSEC) 20 MG capsule, Take 1 capsule (20 mg total) by mouth 2 (two) times daily before a meal., Disp: 180 capsule, Rfl: 3   ondansetron (ZOFRAN-ODT) 4 MG disintegrating tablet, TAKE 1 TABLET BY MOUTH EVERY 8 HOURS AS NEEDED FOR NAUSEA AND VOMITING, Disp: 20 tablet, Rfl: 3   OVER THE COUNTER MEDICATION, Cherry extract bid, Disp: ,  Rfl:    OVER THE COUNTER MEDICATION, B-12 one daily, Disp: , Rfl:    rosuvastatin (CRESTOR) 10 MG tablet, Take 1 tablet (10 mg total) by mouth daily., Disp: 30 tablet, Rfl: 5   sertraline (ZOLOFT) 50 MG tablet, TAKE 1 TABLET BY MOUTH EVERY DAY, Disp: 90 tablet, Rfl: 1   Sodium Bicarbonate POWD, by Does not apply route., Disp: , Rfl:    traMADol (ULTRAM) 50 MG tablet, Take 1 tablet (50 mg total) by mouth every 6 (six) hours as needed for moderate pain or severe pain., Disp: 60 tablet, Rfl: 0   triamcinolone cream (KENALOG) 0.1 %, Apply 1 application topically 2 (two) times daily. For 7-10 days for recurring eczema issues, Disp: 80 g, Rfl: 1   TURMERIC PO, Take by mouth., Disp: , Rfl:    valACYclovir (VALTREX) 1000 MG tablet, Take 2 pills twice a day for 1 day at first sign of cold sore, Disp: 28 tablet, Rfl: 1  Observations/Objective: Patient is well-developed, well-nourished in no acute distress.  Resting  comfortably  at home.  Head is normocephalic, atraumatic.  No labored breathing.  Speech is clear and coherent with logical content.  Patient is alert and oriented at baseline.  Wheezing and nonproductive cough  Assessment and Plan: 1. COPD exacerbation (HCC) - doxycycline (VIBRA-TABS) 100 MG tablet; Take 1 tablet (100 mg total) by mouth 2 (two) times daily.  Dispense: 20 tablet; Refill: 0 - predniSONE (DELTASONE) 20 MG tablet; Take 2 tablets (40 mg total) by mouth daily with breakfast for 5 days.  Dispense: 10 tablet; Refill: 0 - benzonatate (TESSALON PERLES) 100 MG capsule; Take 1 capsule (100 mg total) by mouth 3 (three) times daily as needed.  Dispense: 20 capsule; Refill: 0  - Take meds as prescribed - Use a cool mist humidifier  -Use saline nose sprays frequently -Force fluids -For any cough or congestion  Use plain Mucinex- regular strength or max strength is fine -For fever or aces or pains- take tylenol or ibuprofen. -Throat lozenges if help -Start prednisone and doxycyline  Follow up with PCP with this week, go to ED if feeling worse    Follow Up Instructions: I discussed the assessment and treatment plan with the patient. The patient was provided an opportunity to ask questions and all were answered. The patient agreed with the plan and demonstrated an understanding of the instructions.  A copy of instructions were sent to the patient via MyChart unless otherwise noted below.     The patient was advised to call back or seek an in-person evaluation if the symptoms worsen or if the condition fails to improve as anticipated.  Time:  I spent 9 minutes with the patient via telehealth technology discussing the above problems/concerns.    Jannifer Rodney, FNP

## 2023-05-14 ENCOUNTER — Encounter: Payer: Self-pay | Admitting: Physician Assistant

## 2023-05-14 ENCOUNTER — Emergency Department (HOSPITAL_BASED_OUTPATIENT_CLINIC_OR_DEPARTMENT_OTHER): Payer: Medicare HMO

## 2023-05-14 ENCOUNTER — Telehealth: Payer: Self-pay | Admitting: Family Medicine

## 2023-05-14 ENCOUNTER — Emergency Department (HOSPITAL_BASED_OUTPATIENT_CLINIC_OR_DEPARTMENT_OTHER)
Admission: EM | Admit: 2023-05-14 | Discharge: 2023-05-14 | Disposition: A | Discharge status: Home / Self Care | Payer: Medicare HMO | Attending: Emergency Medicine | Admitting: Emergency Medicine

## 2023-05-14 ENCOUNTER — Other Ambulatory Visit: Payer: Self-pay

## 2023-05-14 ENCOUNTER — Encounter (HOSPITAL_BASED_OUTPATIENT_CLINIC_OR_DEPARTMENT_OTHER): Payer: Self-pay

## 2023-05-14 ENCOUNTER — Ambulatory Visit (INDEPENDENT_AMBULATORY_CARE_PROVIDER_SITE_OTHER): Payer: Medicare HMO | Admitting: Physician Assistant

## 2023-05-14 VITALS — BP 138/80 | HR 106 | Temp 97.1°F | Ht 68.0 in | Wt 266.0 lb

## 2023-05-14 DIAGNOSIS — J069 Acute upper respiratory infection, unspecified: Secondary | ICD-10-CM | POA: Insufficient documentation

## 2023-05-14 DIAGNOSIS — I2510 Atherosclerotic heart disease of native coronary artery without angina pectoris: Secondary | ICD-10-CM | POA: Insufficient documentation

## 2023-05-14 DIAGNOSIS — R000 Tachycardia, unspecified: Secondary | ICD-10-CM | POA: Diagnosis not present

## 2023-05-14 DIAGNOSIS — I700 Atherosclerosis of aorta: Secondary | ICD-10-CM | POA: Insufficient documentation

## 2023-05-14 DIAGNOSIS — R0602 Shortness of breath: Secondary | ICD-10-CM | POA: Insufficient documentation

## 2023-05-14 DIAGNOSIS — K449 Diaphragmatic hernia without obstruction or gangrene: Secondary | ICD-10-CM | POA: Diagnosis not present

## 2023-05-14 DIAGNOSIS — R911 Solitary pulmonary nodule: Secondary | ICD-10-CM | POA: Diagnosis not present

## 2023-05-14 DIAGNOSIS — R059 Cough, unspecified: Secondary | ICD-10-CM | POA: Diagnosis not present

## 2023-05-14 DIAGNOSIS — B9789 Other viral agents as the cause of diseases classified elsewhere: Secondary | ICD-10-CM | POA: Insufficient documentation

## 2023-05-14 LAB — CBC WITH DIFFERENTIAL/PLATELET
Abs Immature Granulocytes: 0.02 10*3/uL (ref 0.00–0.07)
Basophils Absolute: 0.1 10*3/uL (ref 0.0–0.1)
Basophils Relative: 1 %
Eosinophils Absolute: 0.3 10*3/uL (ref 0.0–0.5)
Eosinophils Relative: 5 %
HCT: 38.4 % (ref 36.0–46.0)
Hemoglobin: 13.1 g/dL (ref 12.0–15.0)
Immature Granulocytes: 0 %
Lymphocytes Relative: 16 %
Lymphs Abs: 1 10*3/uL (ref 0.7–4.0)
MCH: 30.3 pg (ref 26.0–34.0)
MCHC: 34.1 g/dL (ref 30.0–36.0)
MCV: 88.9 fL (ref 80.0–100.0)
Monocytes Absolute: 0.2 10*3/uL (ref 0.1–1.0)
Monocytes Relative: 3 %
Neutro Abs: 4.8 10*3/uL (ref 1.7–7.7)
Neutrophils Relative %: 75 %
Platelets: 200 10*3/uL (ref 150–400)
RBC: 4.32 MIL/uL (ref 3.87–5.11)
RDW: 14.4 % (ref 11.5–15.5)
WBC: 6.4 10*3/uL (ref 4.0–10.5)
nRBC: 0 % (ref 0.0–0.2)

## 2023-05-14 LAB — BASIC METABOLIC PANEL
Anion gap: 12 (ref 5–15)
BUN: 19 mg/dL (ref 8–23)
CO2: 19 mmol/L — ABNORMAL LOW (ref 22–32)
Calcium: 9.7 mg/dL (ref 8.9–10.3)
Chloride: 106 mmol/L (ref 98–111)
Creatinine, Ser: 1.14 mg/dL — ABNORMAL HIGH (ref 0.44–1.00)
GFR, Estimated: 50 mL/min — ABNORMAL LOW (ref >60)
Glucose, Bld: 183 mg/dL — ABNORMAL HIGH (ref 70–99)
Potassium: 4 mmol/L (ref 3.5–5.1)
Sodium: 137 mmol/L (ref 135–145)

## 2023-05-14 LAB — TROPONIN I (HIGH SENSITIVITY): Troponin I (High Sensitivity): 4 ng/L (ref <18)

## 2023-05-14 LAB — BRAIN NATRIURETIC PEPTIDE: B Natriuretic Peptide: 51.7 pg/mL (ref 0.0–100.0)

## 2023-05-14 MED ORDER — IOHEXOL 350 MG/ML SOLN
100.0000 mL | Freq: Once | INTRAVENOUS | Status: AC | PRN
  Administered 2023-05-14: 75 mL via INTRAVENOUS

## 2023-05-14 NOTE — Telephone Encounter (Signed)
Pt was seen in office & currently in ED  Patient Name First: Angie Ramus Last: Velasquez Gender: Female DOB: 05/14/1946 Age: 77 Y 11 M 17 D Return Phone Number: [phone] (Primary) Address: City/ State/ Zip: [place]  [address] Client [facility] - Administrator, [facility] Provider Tana Conch- MD Contact Type Call Who Is Calling Patient / Member / Family / Caregiver Call Type Triage / Clinical Relationship To Patient Self Return Phone Number [phone] (Primary) Chief Complaint WHEEZING Reason for Call Symptomatic / Request for Health Information Initial Comment Caller says she went to urgent care for pneumonia, she is wheezing. Caller was transferred by clinic. Translation No Nurse Assessment Nurse: Yetta Barre, RN, Miranda Date/Time (Eastern Time): 05/14/2023 10:12:46 AM Confirm and document reason for call. If symptomatic, describe symptoms. ---Caller states she was seen by PCP for URI and prescribed Claritin and Flonase. She started wheezing on Friday and MD called in Albuterol inhaler. She got worse over the weekend. Yesterday, she had a virtual visit and prescribed antibiotics, cough medication, and steroids. She was told to be seen today to r/ o Pneumonia. She is still wheezing (Described as clicking sound when breathing in). Albuterol Q2-3 hrs, last dose unknown, but probably 3 hrs ago. Does the patient have any new or worsening symptoms? ---Yes Will a triage be completed? ---Yes Related visit to physician within the last 2 weeks? ---Yes Does the PT have any chronic conditions? (i.e. diabetes, asthma, this includes High risk factors for pregnancy, etc.) ---Yes List chronic conditions. ---COPD, Is this a behavioral health or substance abuse call? ---No Guidelines Guideline Title Affirmed Question Affirmed Notes Nurse Date/Time (Eastern Time) Asthma Attack [1] Wheezing or coughing  AND [2] hasn't used neb or inhaler (up to 3 treatments given 20 minutes apart) AND [3] it's available Yetta Barre, RN, Miranda 05/14/2023 10:19:32 AM Disp. Time Lamount Cohen Time) Disposition Final User 05/14/2023 10:08:50 AM Send to Urgent Maryjo Rochester 05/14/2023 10:30:54 AM Urgent Home Treatment with FollowUp Call Yes Yetta Barre RN, Miranda Final Disposition 05/14/2023 10:30:54 AM Urgent Home Treatment with Follow-Up Call Yes Yetta Barre, RN, Miranda Caller Disagree/Comply Comply Caller Understands Yes PreDisposition Call Doctor Care Advice Given Per Guideline URGENT HOME TREATMENT WITH FOLLOW-UP CALL: * Call-back instructions. CALL CENTER PROVIDES RN CALLBACKS: * You should usually improve with the home treatment advice I give you. * I'll call you back in 30-60 minutes to see how you are doing. * Call me back immediately if: you become worse before my follow-up call. ASTHMA ATTACK - TREATMENT - QUICK-RELIEF MEDICINE: * During an ASTHMA ATTACK use your SHORT-ACTING QUICK-RELIEF INHALER (4 puffs, such as ALBUTEROL) or nebulizer up to 3 times, every 20 minutes as needed. * Note: You probably need to see a doctor right away if an asthma attack is not better after 2 or 3 quick-relief treatments (such as albuterol by inhaler or nebulizer) 20 minutes apart. CARE ADVICE given per Asthma Attack (Adult) guideline. Comments User: Grier Rocher, RN Date/Time Lamount Cohen Time): 05/14/2023 10:34:33 AM Appt scheduled for 1120. User: Grier Rocher, RN Date/Time Lamount Cohen Time): 05/14/2023 10:34:58 AM Follow up call not done because appt scheduled within that time frame. Referrals REFERRED TO PCP OFFICE

## 2023-05-14 NOTE — Telephone Encounter (Signed)
Was triaged for possible pneumonia - wheezing, cough, congestion, etc.

## 2023-05-14 NOTE — Telephone Encounter (Signed)
FYI

## 2023-05-14 NOTE — Telephone Encounter (Signed)
Noted thanks °

## 2023-05-14 NOTE — Discharge Instructions (Signed)
Luckily your CT scan is negative for a blood clot in the lung.  Please follow up with your doctor in the office.  I would have you continue the antibiotics and steroids until completed.

## 2023-05-14 NOTE — ED Provider Notes (Signed)
[facility] AT [facility] Provider Note   CSN: [id number] Arrival date & time: 05/14/23  1145     History  Chief Complaint  Patient presents with   Cough    Angie Velasquez is a 77 y.o. female.  77 yo F with a chief complaints of ongoing cough and congestion.  She has unfortunately had symptoms now for 3 weeks.  2 weeks of severe head congestion and now she feels like she is coughing quite a bit more.  She could hear herself wheezing when she was breathing and so her family doctor had called her and albuterol.  She has been taking this without much improvement.  She had a video visit yesterday and they prescribed her doxycycline and prednisone.  She has been able to take 2 doses of doxycycline and 1 dose of prednisone this morning.  When she went in for a in person visit today they were concerned because they did not hear any wheezing and were concerned that despite having less than 1 day of treatment she is not any better.  They then felt like PE was the most likely diagnosis and sent her to the emergency department for imaging.   Cough      Home Medications Prior to Admission medications   Medication Sig Start Date End Date Taking? Authorizing Provider  albuterol (VENTOLIN HFA) 108 (90 Base) MCG/ACT inhaler Inhale 2 puffs into the lungs every 6 (six) hours as needed for wheezing or shortness of breath. 05/11/23   Shelva Majestic, MD  allopurinol (ZYLOPRIM) 300 MG tablet TAKE 1 TABLET BY MOUTH 2 TIMES DAILY. 05/01/23   Shelva Majestic, MD  benzonatate (TESSALON PERLES) 100 MG capsule Take 1 capsule (100 mg total) by mouth 3 (three) times daily as needed. 05/13/23   Junie Spencer, FNP  CALCIUM-MAGNESIUM-ZINC PO Take by mouth.    [provider]  clonazePAM (KLONOPIN) 1 MG tablet TAKE 0.5- 1 TABLET(0.5 MG- 1 mg) BY MOUTH AT BEDTIME AS NEEDED. Do not drive for at least 8 hours after taking 10/20/22   Shelva Majestic, MD  CRANBERRY  PO Take by mouth.    [provider]  diazepam (VALIUM) 10 MG tablet Take 1 tablet (10 mg total) by mouth every 6 (six) hours as needed (as needed for muscle spasms). 12/09/19   Shelva Majestic, MD  diclofenac sodium (VOLTAREN) 1 % GEL Apply topically to affected area qid 03/18/19   Andrena Mews, DO  doxycycline (VIBRA-TABS) 100 MG tablet Take 1 tablet (100 mg total) by mouth 2 (two) times daily. 05/13/23   Junie Spencer, FNP  Ginger, Zingiber officinalis, (GINGER ROOT) 550 MG CAPS Take by mouth.    [provider]  ipratropium (ATROVENT) 0.03 % nasal spray Place 2 sprays into both nostrils every 12 (twelve) hours. 01/24/23   Viviano Simas, FNP  lisinopril (ZESTRIL) 5 MG tablet Take 1 tablet (5 mg total) by mouth daily. 10/20/22   Shelva Majestic, MD  nystatin cream (MYCOSTATIN) APPLY 1 APPLICATION TOPICALLY TO RASH IN GROIN TWICE DAILY FOR UP TO 2 WEEKS MAXIMUM 03/12/19   Shelva Majestic, MD  omeprazole (PRILOSEC) 20 MG capsule Take 1 capsule (20 mg total) by mouth 2 (two) times daily before a meal. 01/02/23   Shelva Majestic, MD  ondansetron (ZOFRAN-ODT) 4 MG disintegrating tablet TAKE 1 TABLET BY MOUTH EVERY 8 HOURS AS NEEDED FOR NAUSEA AND VOMITING 04/27/23   Shelva Majestic, MD  OVER THE COUNTER MEDICATION Cherry extract bid    [provider]  OVER THE COUNTER MEDICATION B-12 one daily    [provider]  predniSONE (DELTASONE) 20 MG tablet Take 2 tablets (40 mg total) by mouth daily with breakfast for 5 days. 05/13/23 05/18/23  Junie Spencer, FNP  rosuvastatin (CRESTOR) 10 MG tablet Take 1 tablet (10 mg total) by mouth daily. 04/27/23   Shelva Majestic, MD  sertraline (ZOLOFT) 50 MG tablet TAKE 1 TABLET BY MOUTH EVERY DAY 05/01/23   Shelva Majestic, MD  Sodium Bicarbonate POWD by Does not apply route.    [provider]  traMADol (ULTRAM) 50 MG tablet Take 1 tablet (50 mg total) by mouth every 6 (six) hours as needed for moderate pain or  severe pain. 10/20/22   Shelva Majestic, MD  triamcinolone cream (KENALOG) 0.1 % Apply 1 application topically 2 (two) times daily. For 7-10 days for recurring eczema issues 01/10/18   Shelva Majestic, MD  TURMERIC PO Take by mouth.    [provider]  valACYclovir (VALTREX) 1000 MG tablet Take 2 pills twice a day for 1 day at first sign of cold sore 08/16/21   Shelva Majestic, MD      Allergies    Epinephrine, Codeine, Ozempic (0.25 or 0.5 mg-dose) [semaglutide(0.25 or 0.5mg -dos)], Penicillins, and Prednisone    Review of Systems   Review of Systems  Respiratory:  Positive for cough.     Physical Exam Updated Vital Signs BP 133/73 (BP Location: Right Arm)   Pulse (!) 109   Temp 98.1 F (36.7 C) (Oral)   Resp 20   Ht 5\' 8"  (1.727 m)   Wt 120.7 kg   SpO2 95%   BMI 40.46 kg/m  Physical Exam Vitals and nursing note reviewed.  Constitutional:      General: She is not in acute distress.    Appearance: She is well-developed. She is not diaphoretic.     Comments: BMi 41   HENT:     Head: Normocephalic and atraumatic.  Eyes:     Pupils: Pupils are equal, round, and reactive to light.  Cardiovascular:     Rate and Rhythm: Normal rate and regular rhythm.     Heart sounds: No murmur heard.    No friction rub. No gallop.  Pulmonary:     Effort: Pulmonary effort is normal.     Breath sounds: No wheezing or rales.     Comments: Distant breath sounds without any obvious wheezes.  No tachypnea Abdominal:     General: There is no distension.     Palpations: Abdomen is soft.     Tenderness: There is no abdominal tenderness.  Musculoskeletal:        General: No tenderness.     Cervical back: Normal range of motion and neck supple.  Skin:    General: Skin is warm and dry.  Neurological:     Mental Status: She is alert and oriented to person, place, and time.  Psychiatric:        Behavior: Behavior normal.     ED Results / Procedures / Treatments   Labs (all labs  ordered are listed, but only abnormal results are displayed) Labs Reviewed  BASIC METABOLIC PANEL - Abnormal; Notable for the following components:      Result Value   CO2 19 (*)    Glucose, Bld 183 (*)    Creatinine, Ser 1.14 (*)    GFR,  Estimated 50 (*)    All other components within normal limits  BRAIN NATRIURETIC PEPTIDE  CBC WITH DIFFERENTIAL/PLATELET  TROPONIN I (HIGH SENSITIVITY)    EKG EKG Interpretation  Date/Time:  Monday May 14 2023 11:54:09 EDT Ventricular Rate:  106 PR Interval:  209 QRS Duration: 85 QT Interval:  348 QTC Calculation: 463 R Axis:   48 Text Interpretation: Sinus tachycardia Borderline prolonged PR interval Consider right atrial enlargement No significant change since last tracing Confirmed by Melene Plan [phone]) on 05/14/2023 12:03:34 PM  Radiology CT Angio Chest PE W and/or Wo Contrast  Result Date: 05/14/2023 CLINICAL DATA:  Pulmonary embolism (PE) suspected, high prob EXAM: CT ANGIOGRAPHY CHEST WITH CONTRAST TECHNIQUE: Multidetector CT imaging of the chest was performed using the standard protocol during bolus administration of intravenous contrast. Multiplanar CT image reconstructions and MIPs were obtained to evaluate the vascular anatomy. RADIATION DOSE REDUCTION: This exam was performed according to the departmental dose-optimization program which includes automated exposure control, adjustment of the mA and/or kV according to patient size and/or use of iterative reconstruction technique. CONTRAST:  75mL OMNIPAQUE IOHEXOL 350 MG/ML SOLN COMPARISON:  Chest x-ray 04/15/2010 FINDINGS: Cardiovascular: Satisfactory opacification of the pulmonary arteries to the segmental level. No evidence of pulmonary embolism. Thoracic aorta is nonaneurysmal. Mild atherosclerotic calcification of the thoracic aorta and coronary arteries. Normal heart size. No pericardial effusion. Mediastinum/Nodes: No enlarged mediastinal, hilar, or axillary lymph nodes. Thyroid gland,  trachea, and esophagus demonstrate no significant findings. Small hiatal hernia. Lungs/Pleura: 5 mm subpleural nodule within the lateral aspect of the right upper lobe (series 6, image 73). No additional pulmonary nodules. No focal airspace consolidation. No pleural effusion or pneumothorax. Upper Abdomen: No acute abnormality. Musculoskeletal: No chest wall abnormality. No acute or significant osseous findings. Review of the MIP images confirms the above findings. IMPRESSION: 1. No evidence of pulmonary embolism or other acute intrathoracic findings. 2. Solitary 5 mm subpleural nodule within the right upper lobe. No follow-up needed if patient is low-risk.This recommendation follows the consensus statement: Guidelines for Management of Incidental Pulmonary Nodules Detected on CT Images: From the Fleischner Society 2017; Radiology 2017; 284:228-243. 3. Small hiatal hernia. 4. Aortic and coronary artery atherosclerosis (ICD10-I70.0). Electronically Signed   By: Duanne Guess D.O.   On: 05/14/2023 13:40    Procedures Procedures    Medications Ordered in ED Medications  iohexol (OMNIPAQUE) 350 MG/ML injection 100 mL (75 mLs Intravenous Contrast Given 05/14/23 1318)    ED Course/ Medical Decision Making/ A&P                             Medical Decision Making Amount and/or Complexity of Data Reviewed Labs: ordered. Radiology: ordered.  Risk Prescription drug management.   77 yo F with a chief complaint of what sounds like an upper respiratory illness started with sinus congestion progressing more to cough going on for the past 3 weeks now.  On my record review the patient has had multiple interactions with her PCP mostly virtually and was started on doxycycline and prednisone.  She has been using her albuterol inhaler fairly frequently without obvious improvement.  She is seen today in the clinic and due to her not having any wheezing and not improving with her less than 1 day course of  antibiotics and steroids she was sent in for CT imaging.  I discussed with the patient that she much more likely has a lingering viral syndrome.  I  feel it is very unlikely that she has a pulmonary embolism.  I considered ordering a D-dimer though if the patient is suffering from an upper respiratory illness likely will be positive.  She is mildly tachycardic on arrival as well.  She has no risk factor for PE or DVT otherwise. Regardless will obtain CT imaging of the chest the troponin and BNP basic blood work.  Trop negative, bnp negative, no leukocytosis.  No electrolyte abnormality.  CTa is negative for pulmonary embolism or pneumonia.  2:10 PM:  I have discussed the diagnosis/risks/treatment options with the patient.  Evaluation and diagnostic testing in the emergency department does not suggest an emergent condition requiring admission or immediate intervention beyond what has been performed at this time.  They will follow up with PCP. We also discussed returning to the ED immediately if new or worsening sx occur. We discussed the sx which are most concerning (e.g., sudden worsening pain, fever, inability to tolerate by mouth) that necessitate immediate return. Medications administered to the patient during their visit and any new prescriptions provided to the patient are listed below.  Medications given during this visit Medications  iohexol (OMNIPAQUE) 350 MG/ML injection 100 mL (75 mLs Intravenous Contrast Given 05/14/23 1318)     The patient appears reasonably screen and/or stabilized for discharge and I doubt any other medical condition or other [facility] requiring further screening, evaluation, or treatment in the ED at this time prior to discharge.          Final Clinical Impression(s) / ED Diagnoses Final diagnoses:  Viral URI with cough    Rx / DC Orders ED Discharge Orders     None         Melene Plan, DO 05/14/23 1410

## 2023-05-14 NOTE — Progress Notes (Signed)
Angie Velasquez is a 77 y.o. female here for a new problem.   History of Present Illness:   Chief Complaint  Patient presents with   Cough    Pt c/o wheezing started on Friday, SOB, cough non-productive.     Cough: She complains of wheezing, productive cough, congestion, shortness of breath and chest pain.  She reports wheezing and sinus congestion started Friday 6/7.  Endorses centralized chest pain, dizziness/lightheadedness when bending over. Chest pain is not radiating.  She began taking Claritin and Flonase on Friday to help, but her symptoms did not improve.   Took a Covid test yesterday, results were negative. She had a virtual visit yesterday, was prescribed Prednisone and Doxycycline.   She took 2 Doxycycline yesterday and started her Prednisone this morning.  She is taking Mucinex every 12 hours and using albuterol inhaler. She notes she has not been able to sleep well, and is waking up every hour.  She almost called Emergency Medical Services yesterday due to severity of symptom(s).  Denies any fever, leg swelling, or recent travel.   Past Medical History:  Diagnosis Date   Anxiety    years ago - no longer an issue   Arthritis    gout   Cataracts, bilateral    Complication of anesthesia    Fracture of pelvis (HCC)    GERD (gastroesophageal reflux disease)    Hx of chest pain    "anxiety"   Hyperlipidemia    INSOMNIA 10/14/2008   in past   Joint pain    Kidney problem    Low blood sugar    eats several smaller meals a day   Obesity    Palpitations    Stress related    Proteinuria    H/O   Tobacco abuse    quit smoking 08/2013     Social History   Tobacco Use   Smoking status: Former    Packs/day: 1.00    Years: 35.00    Additional pack years: 0.00    Total pack years: 35.00    Types: Cigarettes    Quit date: 09/01/2013    Years since quitting: 9.7   Smokeless tobacco: Never   Tobacco comments:    Former smoker with no desire to  smoke again. Smoking years edited at patients request to include periods when she did not smoke.  Vaping Use   Vaping Use: Never used  Substance Use Topics   Alcohol use: Yes    Alcohol/week: 1.0 standard drink of alcohol    Types: 1 Shots of liquor per week    Comment: occasional   Drug use: No    Past Surgical History:  Procedure Laterality Date   ABDOMINAL HYSTERECTOMY     APPENDECTOMY     with hysterectomy   childbirth     x4   CHOLECYSTECTOMY  2001   EYE SURGERY Bilateral 2015   cataract surgery with lens implant   NEPHRECTOMY  1992   complex cystic mass - nephrectomy right   ORIF ANKLE FRACTURE Right 07/02/2014   Procedure: OPEN REDUCTION INTERNAL FIXATION (ORIF) RIGHT ANKLE BIMALLOELAR FRACTURE;  Surgeon: Toni Arthurs, MD;  Location: [facility] OR;  Service: Orthopedics;  Laterality: Right;   OTHER SURGICAL HISTORY     hysterectomy, R ovary remains   SHOULDER SURGERY     bone spurs left    Family History  Problem Relation Age of Onset   Ovarian cancer Mother        Dr. Darnelle Catalan  Arthritis Mother    Obesity Mother    Stroke Father    Alzheimer's disease Father        early, states also parkinsons. died 7   Hypertension Father    Obesity Father    Colon cancer Paternal Grandmother    Liver disease Daughter        On hospice age 70   Esophageal cancer Neg Hx    Stomach cancer Neg Hx    Rectal cancer Neg Hx     Allergies  Allergen Reactions   Epinephrine Palpitations   Codeine Nausea Only   Ozempic (0.25 Or 0.5 Mg-Dose) [Semaglutide(0.25 Or 0.5mg -Dos)]     I have had a terrible reaction with Ozempic. I was on vacation at [facility] and started vomiting and gas pains like I was in labor. On Sunday I started passing bright red blood with bowel movements. I drove back yesterday. I have not eaten anything solid for 2 days. I am not going to continue taking this.    Penicillins Nausea Only   Prednisone Cough    Current Medications:   Current Outpatient  Medications:    albuterol (VENTOLIN HFA) 108 (90 Base) MCG/ACT inhaler, Inhale 2 puffs into the lungs every 6 (six) hours as needed for wheezing or shortness of breath., Disp: 18 g, Rfl: 2   allopurinol (ZYLOPRIM) 300 MG tablet, TAKE 1 TABLET BY MOUTH 2 TIMES DAILY., Disp: 180 tablet, Rfl: 2   benzonatate (TESSALON PERLES) 100 MG capsule, Take 1 capsule (100 mg total) by mouth 3 (three) times daily as needed., Disp: 20 capsule, Rfl: 0   CALCIUM-MAGNESIUM-ZINC PO, Take by mouth., Disp: , Rfl:    clonazePAM (KLONOPIN) 1 MG tablet, TAKE 0.5- 1 TABLET(0.5 MG- 1 mg) BY MOUTH AT BEDTIME AS NEEDED. Do not drive for at least 8 hours after taking, Disp: 90 tablet, Rfl: 1   CRANBERRY PO, Take by mouth., Disp: , Rfl:    diazepam (VALIUM) 10 MG tablet, Take 1 tablet (10 mg total) by mouth every 6 (six) hours as needed (as needed for muscle spasms)., Disp: 30 tablet, Rfl: 0   diclofenac sodium (VOLTAREN) 1 % GEL, Apply topically to affected area qid, Disp: 100 g, Rfl: 2   doxycycline (VIBRA-TABS) 100 MG tablet, Take 1 tablet (100 mg total) by mouth 2 (two) times daily., Disp: 20 tablet, Rfl: 0   Ginger, Zingiber officinalis, (GINGER ROOT) 550 MG CAPS, Take by mouth., Disp: , Rfl:    ipratropium (ATROVENT) 0.03 % nasal spray, Place 2 sprays into both nostrils every 12 (twelve) hours., Disp: 30 mL, Rfl: 12   lisinopril (ZESTRIL) 5 MG tablet, Take 1 tablet (5 mg total) by mouth daily., Disp: 90 tablet, Rfl: 3   nystatin cream (MYCOSTATIN), APPLY 1 APPLICATION TOPICALLY TO RASH IN GROIN TWICE DAILY FOR UP TO 2 WEEKS MAXIMUM, Disp: 90 g, Rfl: 2   omeprazole (PRILOSEC) 20 MG capsule, Take 1 capsule (20 mg total) by mouth 2 (two) times daily before a meal., Disp: 180 capsule, Rfl: 3   ondansetron (ZOFRAN-ODT) 4 MG disintegrating tablet, TAKE 1 TABLET BY MOUTH EVERY 8 HOURS AS NEEDED FOR NAUSEA AND VOMITING, Disp: 20 tablet, Rfl: 3   OVER THE COUNTER MEDICATION, Cherry extract bid, Disp: , Rfl:    OVER THE COUNTER  MEDICATION, B-12 one daily, Disp: , Rfl:    predniSONE (DELTASONE) 20 MG tablet, Take 2 tablets (40 mg total) by mouth daily with breakfast for 5 days., Disp: 10 tablet, Rfl: 0  rosuvastatin (CRESTOR) 10 MG tablet, Take 1 tablet (10 mg total) by mouth daily., Disp: 30 tablet, Rfl: 5   sertraline (ZOLOFT) 50 MG tablet, TAKE 1 TABLET BY MOUTH EVERY DAY, Disp: 90 tablet, Rfl: 1   Sodium Bicarbonate POWD, by Does not apply route., Disp: , Rfl:    traMADol (ULTRAM) 50 MG tablet, Take 1 tablet (50 mg total) by mouth every 6 (six) hours as needed for moderate pain or severe pain., Disp: 60 tablet, Rfl: 0   triamcinolone cream (KENALOG) 0.1 %, Apply 1 application topically 2 (two) times daily. For 7-10 days for recurring eczema issues, Disp: 80 g, Rfl: 1   TURMERIC PO, Take by mouth., Disp: , Rfl:    valACYclovir (VALTREX) 1000 MG tablet, Take 2 pills twice a day for 1 day at first sign of cold sore, Disp: 28 tablet, Rfl: 1   Review of Systems:   Review of Systems  HENT:  Positive for congestion (sinus).   Respiratory:  Positive for cough (productive), shortness of breath and wheezing.   Cardiovascular:  Positive for chest pain.    Vitals:   Vitals:   05/14/23 1113  BP: 138/80  Pulse: (!) 106  Temp: (!) 97.1 F (36.2 C)  TempSrc: Temporal  SpO2: 96%  Weight: 266 lb (120.7 kg)  Height: 5\' 8"  (1.727 m)     Body mass index is 40.45 kg/m.  Physical Exam:   Physical Exam Vitals and nursing note reviewed.  Constitutional:      General: She is not in acute distress.    Appearance: She is well-developed. She is not ill-appearing or toxic-appearing.  HENT:     Head: Normocephalic and atraumatic.     Right Ear: Tympanic membrane, ear canal and external ear normal. Tympanic membrane is not erythematous, retracted or bulging.     Left Ear: Tympanic membrane, ear canal and external ear normal. Tympanic membrane is not erythematous, retracted or bulging.     Nose: Nose normal.     Right  Sinus: No maxillary sinus tenderness or frontal sinus tenderness.     Left Sinus: No maxillary sinus tenderness or frontal sinus tenderness.     Mouth/Throat:     Pharynx: Uvula midline. No posterior oropharyngeal erythema.  Eyes:     General: Lids are normal.     Conjunctiva/sclera: Conjunctivae normal.  Neck:     Trachea: Trachea normal.  Cardiovascular:     Rate and Rhythm: Normal rate and regular rhythm.     Heart sounds: Normal heart sounds, S1 normal and S2 normal.  Pulmonary:     Effort: Pulmonary effort is normal. Tachypnea present.     Breath sounds: Normal breath sounds. No decreased breath sounds, wheezing, rhonchi or rales.  Lymphadenopathy:     Cervical: No cervical adenopathy.  Skin:    General: Skin is warm and dry.  Neurological:     Mental Status: She is alert.  Psychiatric:        Speech: Speech normal.        Behavior: Behavior normal. Behavior is cooperative.     Assessment and Plan:   SOB (shortness of breath) No wheeze on my exam I'm unable to explain her symptoms and have concerns for possible PNEUMONIA, PULMONARY EMBOLISM, MI, among others She did take a total of 8 puffs of albuterol within 1 hour of coming to our office so I'm unsure if this is contributing to her tachycardia Feel as thought patient needs stat blood work, EKG and imaging  for further evaluation and would best be served in ER -- she is agreeable and refused EMS transport today   I,Rachel Rivera,acting as a scribe for [employer], PA.,have documented all relevant documentation on the behalf of Jarold Motto, PA,as directed by  Jarold Motto, PA while in the presence of Jarold Motto, [name].  I, Jarold Motto, [name], have reviewed all documentation for this visit. The documentation on 05/14/23 for the exam, diagnosis, procedures, and orders are all accurate and complete.   Jarold Motto, PA-C

## 2023-05-14 NOTE — ED Triage Notes (Addendum)
Patient here POV from Home.  Endorses Cough, Congestion and SOB that began 2-4 Days ago. Worsened since.   No Known Fevers. Sent by PCP for CTA and Assessment.   NAD Noted during Triage. A&Ox4. GCS 15. Ambulatory.

## 2023-05-14 NOTE — Patient Instructions (Signed)
It was great to see you!  I reviewed your case with Dr Durene Cal -- please go to the Emergency Room at [facility] since you are not responding to outpatient management of steroids and antibiotics.  [facility] Emergency Department at [facility] in: [facility] Address: [address], [place], [place] [address]   Take care,  Jarold Motto PA-C

## 2023-05-16 NOTE — Telephone Encounter (Signed)
Patient has been scheduled for 6/14 @ 4pm w/ Dr. Durene Cal.

## 2023-05-18 ENCOUNTER — Ambulatory Visit (INDEPENDENT_AMBULATORY_CARE_PROVIDER_SITE_OTHER): Payer: Medicare HMO | Admitting: Family Medicine

## 2023-05-18 ENCOUNTER — Encounter: Payer: Self-pay | Admitting: Family Medicine

## 2023-05-18 DIAGNOSIS — J209 Acute bronchitis, unspecified: Secondary | ICD-10-CM

## 2023-05-18 DIAGNOSIS — J440 Chronic obstructive pulmonary disease with acute lower respiratory infection: Secondary | ICD-10-CM | POA: Diagnosis not present

## 2023-05-18 MED ORDER — BUDESONIDE-FORMOTEROL FUMARATE 160-4.5 MCG/ACT IN AERO: 2.0000 | INHALATION_SPRAY | Freq: Two times a day (BID) | RESPIRATORY_TRACT | 1 refills | Status: AC

## 2023-05-18 MED ORDER — ALBUTEROL SULFATE HFA 108 (90 BASE) MCG/ACT IN AERS
2.0000 | INHALATION_SPRAY | Freq: Four times a day (QID) | RESPIRATORY_TRACT | 2 refills | Status: AC | PRN
Start: 2023-05-18 — End: ?

## 2023-05-18 NOTE — Progress Notes (Signed)
Phone [phone] In person visit   Subjective:   Angie Velasquez is a 77 y.o. year old very pleasant female patient who presents for/with See problem oriented charting Chief Complaint  Patient presents with   COPD    Pt is her to discuss COPD and excessive phlegm.   Past Medical History-  Patient Active Problem List   Diagnosis Date Noted   Depression 12/19/2016    Priority: High   S/p nephrectomy 03/12/2015    Priority: High   Anxiety state 12/09/2009    Priority: High   Fatty liver 06/12/2022    Priority: Medium    CKD (chronic kidney disease), stage III (HCC) 02/14/2019    Priority: Medium    Prediabetes 10/22/2017    Priority: Medium    Hyperglycemia 09/18/2017    Priority: Medium    Gout 03/12/2015    Priority: Medium    Hyperlipidemia 03/12/2015    Priority: Medium    COPD (chronic obstructive pulmonary disease) (HCC) 03/12/2015    Priority: Medium    Arthritis     Priority: Medium    Former smoker     Priority: Medium    Vitamin D deficiency 05/28/2009    Priority: Medium    Hematuria, unspecified 05/26/2009    Priority: Medium    Hearing loss 01/20/2009    Priority: Medium    MICROALBUMINURIA 01/23/2008    Priority: Medium    Osteoarthritis of spine with radiculopathy, cervical region 02/12/2018    Priority: Low   Intertrigo 01/10/2018    Priority: Low   History of osteitis pubis 12/19/2017    Priority: Low   Eczema 06/29/2015    Priority: Low   Tinnitus 03/12/2015    Priority: Low   History of colonic polyps 03/12/2015    Priority: Low   Bimalleolar fracture 07/02/2014    Priority: Low   Arthropathy 05/26/2009    Priority: Low   Insomnia, unspecified 04/27/2009    Priority: Low   DDD (degenerative disc disease), lumbosacral 01/20/2009    Priority: Low   Sialoadenitis 01/20/2009    Priority: Low   GERD 01/23/2008    Priority: Low   Microscopic hematuria 01/23/2008    Priority: Low   UTI (urinary tract infection)  01/08/2008    Priority: Low   Snoring 01/06/2020   Atypical chest pain 03/26/2017   Contact dermatitis and other eczema, due to unspecified cause 05/26/2009    Medications- reviewed and updated Current Outpatient Medications  Medication Sig Dispense Refill   allopurinol (ZYLOPRIM) 300 MG tablet TAKE 1 TABLET BY MOUTH 2 TIMES DAILY. 180 tablet 2   budesonide-formoterol (SYMBICORT) 160-4.5 MCG/ACT inhaler Inhale 2 puffs into the lungs 2 (two) times daily. 1 each 1   CALCIUM-MAGNESIUM-ZINC PO Take by mouth.     CRANBERRY PO Take by mouth.     diclofenac sodium (VOLTAREN) 1 % GEL Apply topically to affected area qid 100 g 2   Ginger, Zingiber officinalis, (GINGER ROOT) 550 MG CAPS Take by mouth.     ipratropium (ATROVENT) 0.03 % nasal spray Place 2 sprays into both nostrils every 12 (twelve) hours. 30 mL 12   lisinopril (ZESTRIL) 5 MG tablet Take 1 tablet (5 mg total) by mouth daily. 90 tablet 3   nystatin cream (MYCOSTATIN) APPLY 1 APPLICATION TOPICALLY TO RASH IN GROIN TWICE DAILY FOR UP TO 2 WEEKS MAXIMUM 90 g 2   omeprazole (PRILOSEC) 20 MG capsule Take 1 capsule (20 mg total) by mouth 2 (two) times daily before  a meal. 180 capsule 3   ondansetron (ZOFRAN-ODT) 4 MG disintegrating tablet TAKE 1 TABLET BY MOUTH EVERY 8 HOURS AS NEEDED FOR NAUSEA AND VOMITING 20 tablet 3   OVER THE COUNTER MEDICATION Cherry extract bid     OVER THE COUNTER MEDICATION B-12 one daily     rosuvastatin (CRESTOR) 10 MG tablet Take 1 tablet (10 mg total) by mouth daily. 30 tablet 5   sertraline (ZOLOFT) 50 MG tablet TAKE 1 TABLET BY MOUTH EVERY DAY 90 tablet 1   Sodium Bicarbonate POWD by Does not apply route.     triamcinolone cream (KENALOG) 0.1 % Apply 1 application topically 2 (two) times daily. For 7-10 days for recurring eczema issues 80 g 1   TURMERIC PO Take by mouth.     valACYclovir (VALTREX) 1000 MG tablet Take 2 pills twice a day for 1 day at first sign of cold sore 28 tablet 1   albuterol (VENTOLIN  HFA) 108 (90 Base) MCG/ACT inhaler Inhale 2 puffs into the lungs every 6 (six) hours as needed for wheezing or shortness of breath. 18 g 2   benzonatate (TESSALON PERLES) 100 MG capsule Take 1 capsule (100 mg total) by mouth 3 (three) times daily as needed. (Patient not taking: Reported on 05/18/2023) 20 capsule 0   clonazePAM (KLONOPIN) 1 MG tablet TAKE 0.5- 1 TABLET(0.5 MG- 1 mg) BY MOUTH AT BEDTIME AS NEEDED. Do not drive for at least 8 hours after taking (Patient not taking: Reported on 05/18/2023) 90 tablet 1   diazepam (VALIUM) 10 MG tablet Take 1 tablet (10 mg total) by mouth every 6 (six) hours as needed (as needed for muscle spasms). (Patient not taking: Reported on 05/18/2023) 30 tablet 0   traMADol (ULTRAM) 50 MG tablet Take 1 tablet (50 mg total) by mouth every 6 (six) hours as needed for moderate pain or severe pain. (Patient not taking: Reported on 05/18/2023) 60 tablet 0   No current facility-administered medications for this visit.     Objective:  BP 132/80   Pulse 86   Temp 98 F (36.7 C)   Ht 5\' 8"  (1.727 m)   Wt 267 lb 9.6 oz (121.4 kg)   SpO2 95%   BMI 40.69 kg/m  Gen: NAD, resting comfortably Tympanic membrane normal bilaterally, nasal turbinates edematous and with clear drainage, pharynx with mild drainage, significant left maxillary sinus pressure- mild in right side, minimal frontal sinus pressure CV: RRR no murmurs rubs or gallops Lungs: CTAB no crackles, wheeze, rhonchi Ext: trace edema Skin: warm, dry    Assessment and Plan   # Cough congestion/sinus pressure-COPD exacerbation? S: Patient with history of COPD found radiographically.  Traditionally has been asymptomatic.  She quit smoking 11 years ago. -Patient contacted me about a week after our last visit complaining of some allergies/feeling stopped up in her sinuses for 4 days. - We mention trying Flonase/Claritin combination at that time  She updated me on May 11, 2023 that she was continuing to have  congestion-her head had cleared considerably with allergy medication but she noted wheezing which had not been noted previously.  We considered prednisone but she had an allergy listed to prednisone so we opted to monitor.  On June 8/Saturday afternoon wheezing significantly increased and she had to use the inhaler much more frequently.  Work of breathing had increased.  She started Mucinex for chest congestion.  She had a video visit on 05/13/2023 and she was started on doxycycline, prednisone and Tessalon. -  On June 10th she was seen in our clinic and appeared severely short of breath and was also tachycardic-there was concern for possible pulmonary embolism as there was no wheeze on exam but patient also had a pulse of albuterol within an hour of coming to the office-was not clear if that was causing the tachycardia or not -In the emergency room on 05/14/2023-Dr. Adela Lank was concerned she may have a lingering viral syndrome.  CT imaging of the chest was negative for pulmonary embolism.  No pneumonia was noted either.  BNP was not elevated pointing away from heart failure.  No significant electrolyte abnormalities.  Plan was for her to finish the antibiotics) She had recently been started on doxycycline-today she reports she has 4 days left  - cough has improved but still gets on stents of coughing that bother her and can gag. Claritin was very helpful in retrospect. Did thankfully tolerate prednisone. She is still hearing some wheeze but improving - when she uses albuterol wheeze does not go away. Tried water bottle with slices of lemon and felt like that was helpful for "cutting" phlegm. .  A/P: Patient with history of radiographically noted COPD but traditionally does not have issues with breathing even with illnesses or allergies - I suspect she had an allergic condition or viral illness that was later exacerbated into a bacterial sinusitis-has had some but minimal improvement on doxycycline-strongly  suspect we may need to switch to Augmentin but she has had nausea on penicillins in the past so we opted to trial the following first - Will trial Symbicort over the weekend and finish doxycycline - Has had extensive workup in the emergency department and no life-threatening illness noted and she is having some improvement in symptoms on prednisone and doxycycline though mild -Considered holding lisinopril -She is can update me in about 4 days when she finishes the doxycycline and as she continues the Symbicort-we will reconsider Augmentin at that time - Another possibility that we discussed that this could be viral bronchitis (which is also irritated her COPD and discussed bronchitis can take 4 to 6 weeks to resolve) but that would not explain her sinus pressure and the fact that most of her cough seems to be related to postnasal drip - Also recommended continue Claritin but she has been taking some Claritin-D which advised against -Prior to listening to patient's lungs which were clear and tapping on her sinuses with significant sinus pressure strongly considered pulmonary consult as well but we opted hold off for now-may reconsider later date  # Solitary 5 mm nodule on 05/14/2023 and right upper lobe-plan for 6 to 71-month follow-up as former smoker  Recommended follow up: Return for as needed for new, worsening, persistent symptoms. Future Appointments  Date Time Provider Department Center  06/21/2023  1:00 PM [facility] ANNUAL WELLNESS VISIT 1 [facility]  11/21/2023 10:00 AM Shelva Majestic, MD [facility]    Lab/Order associations:   ICD-10-CM   1. Acute bronchitis with COPD (HCC)  J44.0 albuterol (VENTOLIN HFA) 108 (90 Base) MCG/ACT inhaler   J20.9       Time Spent: 35 minutes of total time (4:10 PM-4:45 PM) was spent on the date of the encounter performing the following actions: chart review prior to seeing the patient, obtaining history, performing a medically necessary exam,  counseling on the treatment plan and concerns that this could be more sinusitis than COPD, placing orders, and documenting in our EHR.    Return precautions advised.  Jeannett Senior  Yong Channel, MD

## 2023-05-18 NOTE — Patient Instructions (Addendum)
Schedule Symbicort 2 puffs twice a day- then can use albuterol for back up if you get to wheezing or have coughing fit  Could try plain generic claritin   Finish doxycycline 4 more days then update me where you stand- if that left sinus still hurts the way it does I want to try Augmentin for 10 days which is stronger against bacterial sinusitis  I'm actually most concerned about sinusitis   Hold off on pulmonary for now but may refer you if we can't get you better with the above treatments  Recommended follow up: Return for as needed for new, worsening, persistent symptoms.

## 2023-05-21 ENCOUNTER — Telehealth: Payer: Self-pay

## 2023-05-21 NOTE — Telephone Encounter (Signed)
Transition Care Management Unsuccessful Follow-up Telephone Call  Date of discharge and from where:  05/14/2023 [facility]  Attempts:  1st Attempt  Reason for unsuccessful TCM follow-up call:  Left voice message  Keyshawn Hellwig [place]  [facility] Population Health Community Resource Care Guide   [email or web address]  ?? [phone]   Website: [email or web address]  [email or web address]      

## 2023-05-22 ENCOUNTER — Telehealth: Payer: Self-pay

## 2023-05-22 NOTE — Telephone Encounter (Signed)
Transition Care Management Follow-up Telephone Call Date of discharge and from where: 05/14/2023 [facility] How have you been since you were released from the hospital? Patient is beginning to feel better Any questions or concerns? No  Items Reviewed: Did the pt receive and understand the discharge instructions provided? Yes  Medications obtained and verified? Yes  Other?  Patient was very pleased with the care she received Any new allergies since your discharge? No  Dietary orders reviewed? Yes Do you have support at home? Yes   Follow up appointments reviewed:  PCP Hospital f/u appt confirmed? Yes  Scheduled to see Tana Conch, MD on 05/18/2023 @ [facility]. Specialist Hospital f/u appt confirmed? No  Scheduled to see  on  @ . Are transportation arrangements needed? No  If their condition worsens, is the pt aware to call PCP or go to the Emergency Dept.? Yes Was the patient provided with contact information for the PCP's office or ED? Yes Was to pt encouraged to call back with questions or concerns? Yes  Virdell Hoiland [facility]  [facility] Population Health Community Resource Care Guide   [email or web address]  ?? [phone]   Website: [email or web address]  [email or web address]

## 2023-05-24 ENCOUNTER — Other Ambulatory Visit: Payer: Self-pay | Admitting: Family Medicine

## 2023-05-24 MED ORDER — CLONAZEPAM 1 MG PO TABS: ORAL_TABLET | ORAL | 1 refills | Status: DC

## 2023-05-31 ENCOUNTER — Other Ambulatory Visit: Payer: Self-pay

## 2023-05-31 DIAGNOSIS — J441 Chronic obstructive pulmonary disease with (acute) exacerbation: Secondary | ICD-10-CM

## 2023-06-03 DIAGNOSIS — G4733 Obstructive sleep apnea (adult) (pediatric): Secondary | ICD-10-CM | POA: Diagnosis not present

## 2023-06-04 ENCOUNTER — Encounter: Payer: Self-pay | Admitting: Family Medicine

## 2023-06-04 ENCOUNTER — Ambulatory Visit (INDEPENDENT_AMBULATORY_CARE_PROVIDER_SITE_OTHER): Payer: Medicare HMO | Admitting: Family Medicine

## 2023-06-04 ENCOUNTER — Other Ambulatory Visit: Payer: Self-pay

## 2023-06-04 VITALS — BP 130/70 | HR 89 | Temp 97.6°F | Ht 68.0 in | Wt 271.4 lb

## 2023-06-04 DIAGNOSIS — H9202 Otalgia, left ear: Secondary | ICD-10-CM

## 2023-06-04 DIAGNOSIS — H66002 Acute suppurative otitis media without spontaneous rupture of ear drum, left ear: Secondary | ICD-10-CM | POA: Diagnosis not present

## 2023-06-04 DIAGNOSIS — J449 Chronic obstructive pulmonary disease, unspecified: Secondary | ICD-10-CM

## 2023-06-04 DIAGNOSIS — B9689 Other specified bacterial agents as the cause of diseases classified elsewhere: Secondary | ICD-10-CM | POA: Diagnosis not present

## 2023-06-04 DIAGNOSIS — J329 Chronic sinusitis, unspecified: Secondary | ICD-10-CM | POA: Diagnosis not present

## 2023-06-04 MED ORDER — AMOXICILLIN-POT CLAVULANATE 875-125 MG PO TABS: 1.0000 | ORAL_TABLET | Freq: Two times a day (BID) | ORAL | 0 refills | Status: DC

## 2023-06-04 NOTE — Telephone Encounter (Signed)
See below

## 2023-06-04 NOTE — Progress Notes (Signed)
Phone [phone] In person visit   Subjective:   Angie Velasquez is a 77 y.o. year old very pleasant female patient who presents for/with See problem oriented charting Chief Complaint  Patient presents with   left ear pain    Pt c/o continued left ear pain     Past Medical History-  Patient Active Problem List   Diagnosis Date Noted   Depression 12/19/2016    Priority: High   S/p nephrectomy 03/12/2015    Priority: High   Anxiety state 12/09/2009    Priority: High   Fatty liver 06/12/2022    Priority: Medium    CKD (chronic kidney disease), stage III (HCC) 02/14/2019    Priority: Medium    Prediabetes 10/22/2017    Priority: Medium    Hyperglycemia 09/18/2017    Priority: Medium    Gout 03/12/2015    Priority: Medium    Hyperlipidemia 03/12/2015    Priority: Medium    COPD (chronic obstructive pulmonary disease) (HCC) 03/12/2015    Priority: Medium    Arthritis     Priority: Medium    Former smoker     Priority: Medium    Vitamin D deficiency 05/28/2009    Priority: Medium    Hematuria, unspecified 05/26/2009    Priority: Medium    Hearing loss 01/20/2009    Priority: Medium    MICROALBUMINURIA 01/23/2008    Priority: Medium    Osteoarthritis of spine with radiculopathy, cervical region 02/12/2018    Priority: Low   Intertrigo 01/10/2018    Priority: Low   History of osteitis pubis 12/19/2017    Priority: Low   Eczema 06/29/2015    Priority: Low   Tinnitus 03/12/2015    Priority: Low   History of colonic polyps 03/12/2015    Priority: Low   Bimalleolar fracture 07/02/2014    Priority: Low   Arthropathy 05/26/2009    Priority: Low   Insomnia, unspecified 04/27/2009    Priority: Low   DDD (degenerative disc disease), lumbosacral 01/20/2009    Priority: Low   Sialoadenitis 01/20/2009    Priority: Low   GERD 01/23/2008    Priority: Low   Microscopic hematuria 01/23/2008    Priority: Low   UTI (urinary tract infection) 01/08/2008     Priority: Low   Snoring 01/06/2020   Atypical chest pain 03/26/2017   Contact dermatitis and other eczema, due to unspecified cause 05/26/2009    Medications- reviewed and updated Current Outpatient Medications  Medication Sig Dispense Refill   albuterol (VENTOLIN HFA) 108 (90 Base) MCG/ACT inhaler Inhale 2 puffs into the lungs every 6 (six) hours as needed for wheezing or shortness of breath. 18 g 2   allopurinol (ZYLOPRIM) 300 MG tablet TAKE 1 TABLET BY MOUTH 2 TIMES DAILY. 180 tablet 2   amoxicillin-clavulanate (AUGMENTIN) 875-125 MG tablet Take 1 tablet by mouth 2 (two) times daily. 20 tablet 0   budesonide-formoterol (SYMBICORT) 160-4.5 MCG/ACT inhaler Inhale 2 puffs into the lungs 2 (two) times daily. 1 each 1   CALCIUM-MAGNESIUM-ZINC PO Take by mouth.     CRANBERRY PO Take by mouth.     diazepam (VALIUM) 10 MG tablet Take 1 tablet (10 mg total) by mouth every 6 (six) hours as needed (as needed for muscle spasms). 30 tablet 0   diclofenac sodium (VOLTAREN) 1 % GEL Apply topically to affected area qid 100 g 2   Ginger, Zingiber officinalis, (GINGER ROOT) 550 MG CAPS Take by mouth.     ipratropium (ATROVENT)  0.03 % nasal spray Place 2 sprays into both nostrils every 12 (twelve) hours. 30 mL 12   lisinopril (ZESTRIL) 5 MG tablet Take 1 tablet (5 mg total) by mouth daily. 90 tablet 3   nystatin cream (MYCOSTATIN) APPLY 1 APPLICATION TOPICALLY TO RASH IN GROIN TWICE DAILY FOR UP TO 2 WEEKS MAXIMUM 90 g 2   omeprazole (PRILOSEC) 20 MG capsule Take 1 capsule (20 mg total) by mouth 2 (two) times daily before a meal. 180 capsule 3   ondansetron (ZOFRAN-ODT) 4 MG disintegrating tablet TAKE 1 TABLET BY MOUTH EVERY 8 HOURS AS NEEDED FOR NAUSEA AND VOMITING 20 tablet 3   OVER THE COUNTER MEDICATION Cherry extract bid     OVER THE COUNTER MEDICATION B-12 one daily     rosuvastatin (CRESTOR) 10 MG tablet Take 1 tablet (10 mg total) by mouth daily. 30 tablet 5   sertraline (ZOLOFT) 50 MG tablet  TAKE 1 TABLET BY MOUTH EVERY DAY 90 tablet 1   Sodium Bicarbonate POWD by Does not apply route.     traMADol (ULTRAM) 50 MG tablet Take 1 tablet (50 mg total) by mouth every 6 (six) hours as needed for moderate pain or severe pain. 60 tablet 0   triamcinolone cream (KENALOG) 0.1 % Apply 1 application topically 2 (two) times daily. For 7-10 days for recurring eczema issues 80 g 1   TURMERIC PO Take by mouth.     valACYclovir (VALTREX) 1000 MG tablet Take 2 pills twice a day for 1 day at first sign of cold sore 28 tablet 1   benzonatate (TESSALON PERLES) 100 MG capsule Take 1 capsule (100 mg total) by mouth 3 (three) times daily as needed. (Patient not taking: Reported on 06/04/2023) 20 capsule 0   clonazePAM (KLONOPIN) 1 MG tablet TAKE 0.5- 1 TABLET(0.5 MG- 1 mg) BY MOUTH AT BEDTIME AS NEEDED. Do not drive for at least 8 hours after taking (Patient not taking: Reported on 06/04/2023) 90 tablet 1   No current facility-administered medications for this visit.     Objective:  BP 130/70   Pulse 89   Temp 97.6 F (36.4 C)   Ht 5\' 8"  (1.727 m)   Wt 271 lb 6.4 oz (123.1 kg)   SpO2 92%   BMI 41.27 kg/m  Gen: NAD, resting comfortably Right tympanic membrane normal, left tympanic membrane erythematous and with some cloudiness behind it CV: RRR no murmurs rubs or gallops Lungs: CTAB no crackles, wheeze, rhonchi. No increased work of breathing Ext: trace edema Skin: warm, dry     Assessment and Plan    # Left ear pain S: Patient with ongoing cough/congestion.  History of radiographically discovered COPD but traditionally has been asymptomatic.  She quit smoking 11 years ago.  Summary from last visit "-Patient contacted me about a week after our last visit complaining of some allergies/feeling stopped up in her sinuses for 4 days. - We mention trying Flonase/Claritin combination at that time   She updated me on May 11, 2023 that she was continuing to have congestion-her head had cleared  considerably with allergy medication but she noted wheezing which had not been noted previously.  We considered prednisone but she had an allergy listed to prednisone so we opted to monitor.   On June 8/Saturday afternoon wheezing significantly increased and she had to use the inhaler much more frequently.  Work of breathing had increased.  She started Mucinex for chest congestion.  She had a video visit on 05/13/2023  and she was started on doxycycline, prednisone and Tessalon. - On June 10th she was seen in our clinic and appeared severely short of breath and was also tachycardic-there was concern for possible pulmonary embolism as there was no wheeze on exam but patient also had a pulse of albuterol within an hour of coming to the office-was not clear if that was causing the tachycardia or not -In the emergency room on 05/14/2023-Dr. Adela Lank was concerned she may have a lingering viral syndrome.  CT imaging of the chest was negative for pulmonary embolism.  No pneumonia was noted either.  BNP was not elevated pointing away from heart failure.  No significant electrolyte abnormalities.  Plan was for her to finish the antibiotics) She had recently been started on doxycycline-today she reports she has 4 days left   - cough has improved but still gets on stents of coughing that bother her and can gag. Claritin was very helpful in retrospect. Did thankfully tolerate prednisone. She is still hearing some wheeze but improving - when she uses albuterol wheeze does not go away. Tried water bottle with slices of lemon and felt like that was helpful for "cutting" phlegm. .  A/P: Patient with history of radiographically noted COPD but traditionally does not have issues with breathing even with illnesses or allergies - I suspect she had an allergic condition or viral illness that was later exacerbated into a bacterial sinusitis-has had some but minimal improvement on doxycycline-strongly suspect we may need to switch to  Augmentin but she has had nausea on penicillins in the past so we opted to trial the following first - Will trial Symbicort over the weekend and finish doxycycline - Has had extensive workup in the emergency department and no life-threatening illness noted and she is having some improvement in symptoms on prednisone and doxycycline though mild -Considered holding lisinopril -She is can update me in about 4 days when she finishes the doxycycline and as she continues the Symbicort-we will reconsider Augmentin at that time - Another possibility that we discussed that this could be viral bronchitis (which is also irritated her COPD and discussed bronchitis can take 4 to 6 weeks to resolve) but that would not explain her sinus pressure and the fact that most of her cough seems to be related to postnasal drip - Also recommended continue Claritin but she has been taking some Claritin-D which advised against -Prior to listening to patient's lungs which were clear and tapping on her sinuses with significant sinus pressure strongly considered pulmonary consult as well but we opted hold off for now-may reconsider later date"  After visit on 05/30/2023 patient reached out and was still dealing with the coughing with no change despite starting Symbicort and using consistently.-We ultimately referred to pulmonology-she then reported on June 30 left ear pain despite over-the-counter drops and she asked about seeing [facility]-had seen [facility] in the past and not very helpful for her -pain is moderate to severe from anywhere from 3 to 10 and even is having to use tramadol to go to bed at times if cannot sleep. Has not improved at any point during this prolonged illness even with doxyxycline and prednisone.  -new inhaler helping cough some with coughing spells for underlying copd (improved- continue current medications) - continues to feel congested in sinuses. Still getting pressure.  -also having sore  throat -rinses mouthwash after using Symbicort    A/P: Unfortunately Left ear now appears infected. Sinuses still very tender  on left side- recommend Augmentin for 10 days to help both- likely bacterial sinusitis and AOM of left ear. I also think you are getting some drip down your throat causing irritation and possibly hoarseness- hoping this helps  -shed like to keep ENT referral team placed earlier today in case doesn't improve -with improvement on Symbicort in cough shed like to keep pulmonary referral- gave her # to call -new or worsening symptoms she should let us know   Recommended follow up: Return for as needed for new, worsening, persistent symptoms. Future Appointments  Date Time Provider Department Center  06/21/2023  1:00 PM [facility] ANNUAL WELLNESS VISIT 1 [facility]  11/21/2023 10:00 AM Shelva Majestic, MD [facility]    Lab/Order associations:   ICD-10-CM   1. Bacterial sinusitis  J32.9    B96.89     2. Non-recurrent acute suppurative otitis media of left ear without spontaneous rupture of tympanic membrane  H66.002     3. Chronic obstructive pulmonary disease, unspecified COPD type (HCC)  J44.9       Meds ordered this encounter  Medications   amoxicillin-clavulanate (AUGMENTIN) 875-125 MG tablet    Sig: Take 1 tablet by mouth 2 (two) times daily.    Dispense:  20 tablet    Refill:  0    Return precautions advised.  Tana Conch, MD

## 2023-06-04 NOTE — Patient Instructions (Addendum)
Unfortunately Left ear now appears infected. Sinuses still very tender on left side- recommend Augmentin for 10 days to help both. I also think you are getting some drip down your throat causing irritation and possibly hoarseness- hoping this helps  [facility] earlier today put a referral in for ear, nose and throat. Please call them if you have not heard within a week.   Please call [facility] to schedule- appears referral was approved [id number]   Recommended follow up: Return for as needed for new, worsening, persistent symptoms.

## 2023-06-21 ENCOUNTER — Encounter: Payer: Self-pay | Admitting: Family Medicine

## 2023-06-21 ENCOUNTER — Ambulatory Visit: Payer: Medicare HMO

## 2023-06-21 VITALS — Wt 271.0 lb

## 2023-06-21 DIAGNOSIS — Z0000 Encounter for general adult medical examination without abnormal findings: Secondary | ICD-10-CM

## 2023-06-21 NOTE — Progress Notes (Signed)
Subjective:   Angie Velasquez is a 77 y.o. female who presents for Medicare Annual (Subsequent) preventive examination.  Per patient no change in vitals since last visit, unable to obtain new vitals due to telehealth visit   Visit Complete: Virtual  I connected with  [name] on 06/21/23 by a audio enabled telemedicine application and verified that I am speaking with the correct person using two identifiers.  Patient Location: Home  Provider Location: Office/Clinic  I discussed the limitations of evaluation and management by telemedicine. The patient expressed understanding and agreed to proceed.  Patient Medicare AWV questionnaire was completed by the patient on 06/21/23; I have confirmed that all information answered by patient is correct and no changes since this date.  Review of Systems     Cardiac Risk Factors include: advanced age (>65men, >64 women);dyslipidemia;hypertension;obesity (BMI >30kg/m2)     Objective:    Today's Vitals   06/21/23 1307  Weight: 271 lb (122.9 kg)   Body mass index is 41.21 kg/m.     06/21/2023    1:16 PM 05/14/2023   11:50 AM 08/14/2022    9:04 AM 06/02/2022    1:08 PM 01/03/2018    1:06 PM 06/16/2017    3:09 PM 04/28/2015    9:00 AM  Advanced Directives  Does Patient Have a Medical Advance Directive? Yes No No Yes Yes Yes Yes  Type of Estate agent of [place];Living will   Healthcare Power of [employer] of [place];Living will Healthcare Power of [place];Living will  Does patient want to make changes to medical advance directive?     No - Patient declined  No - Patient declined  Copy of Healthcare Power of Attorney in Chart? No - copy requested   No - copy requested  No - copy requested No - copy requested  Would patient like information on creating a medical advance directive?  No - Patient declined No - Patient declined        Current Medications  (verified) Outpatient Encounter Medications as of 06/21/2023  Medication Sig   albuterol (VENTOLIN HFA) 108 (90 Base) MCG/ACT inhaler Inhale 2 puffs into the lungs every 6 (six) hours as needed for wheezing or shortness of breath.   allopurinol (ZYLOPRIM) 300 MG tablet TAKE 1 TABLET BY MOUTH 2 TIMES DAILY.   amoxicillin-clavulanate (AUGMENTIN) 875-125 MG tablet Take 1 tablet by mouth 2 (two) times daily.   benzonatate (TESSALON PERLES) 100 MG capsule Take 1 capsule (100 mg total) by mouth 3 (three) times daily as needed.   budesonide-formoterol (SYMBICORT) 160-4.5 MCG/ACT inhaler Inhale 2 puffs into the lungs 2 (two) times daily.   CALCIUM-MAGNESIUM-ZINC PO Take by mouth.   clonazePAM (KLONOPIN) 1 MG tablet TAKE 0.5- 1 TABLET(0.5 MG- 1 mg) BY MOUTH AT BEDTIME AS NEEDED. Do not drive for at least 8 hours after taking   CRANBERRY PO Take by mouth.   diazepam (VALIUM) 10 MG tablet Take 1 tablet (10 mg total) by mouth every 6 (six) hours as needed (as needed for muscle spasms).   diclofenac sodium (VOLTAREN) 1 % GEL Apply topically to affected area qid   Ginger, Zingiber officinalis, (GINGER ROOT) 550 MG CAPS Take by mouth.   ipratropium (ATROVENT) 0.03 % nasal spray Place 2 sprays into both nostrils every 12 (twelve) hours.   lisinopril (ZESTRIL) 5 MG tablet Take 1 tablet (5 mg total) by mouth daily.   nystatin cream (MYCOSTATIN) APPLY 1 APPLICATION TOPICALLY TO RASH IN [place]  TWICE DAILY FOR UP TO 2 WEEKS MAXIMUM   omeprazole (PRILOSEC) 20 MG capsule Take 1 capsule (20 mg total) by mouth 2 (two) times daily before a meal.   ondansetron (ZOFRAN-ODT) 4 MG disintegrating tablet TAKE 1 TABLET BY MOUTH EVERY 8 HOURS AS NEEDED FOR NAUSEA AND VOMITING   OVER THE COUNTER MEDICATION Cherry extract bid   OVER THE COUNTER MEDICATION B-12 one daily   rosuvastatin (CRESTOR) 10 MG tablet Take 1 tablet (10 mg total) by mouth daily.   sertraline (ZOLOFT) 50 MG tablet TAKE 1 TABLET BY MOUTH EVERY DAY   Sodium  Bicarbonate POWD by Does not apply route.   traMADol (ULTRAM) 50 MG tablet Take 1 tablet (50 mg total) by mouth every 6 (six) hours as needed for moderate pain or severe pain.   triamcinolone cream (KENALOG) 0.1 % Apply 1 application topically 2 (two) times daily. For 7-10 days for recurring eczema issues   TURMERIC PO Take by mouth.   valACYclovir (VALTREX) 1000 MG tablet Take 2 pills twice a day for 1 day at first sign of cold sore   No facility-administered encounter medications on file as of 06/21/2023.    Allergies (verified) Epinephrine, Codeine, Ozempic (0.25 or 0.5 mg-dose) [semaglutide(0.25 or 0.5mg -dos)], Penicillins, and Prednisone   History: Past Medical History:  Diagnosis Date   Anxiety    years ago - no longer an issue   Arthritis    gout   Cataracts, bilateral    Complication of anesthesia    Fracture of pelvis (HCC)    GERD (gastroesophageal reflux disease)    Hx of chest pain    "anxiety"   Hyperlipidemia    INSOMNIA 10/14/2008   in past   Joint pain    Kidney problem    Low blood sugar    eats several smaller meals a day   Obesity    Palpitations    Stress related    Proteinuria    H/O   Tobacco abuse    quit smoking 08/2013   Past Surgical History:  Procedure Laterality Date   ABDOMINAL HYSTERECTOMY     APPENDECTOMY     with hysterectomy   childbirth     x4   CHOLECYSTECTOMY  2001   EYE SURGERY Bilateral 2015   cataract surgery with lens implant   NEPHRECTOMY  1992   complex cystic mass - nephrectomy right   ORIF ANKLE FRACTURE Right 07/02/2014   Procedure: OPEN REDUCTION INTERNAL FIXATION (ORIF) RIGHT ANKLE BIMALLOELAR FRACTURE;  Surgeon: Toni Arthurs, MD;  Location: [facility] OR;  Service: Orthopedics;  Laterality: Right;   OTHER SURGICAL HISTORY     hysterectomy, R ovary remains   SHOULDER SURGERY     bone spurs left   Family History  Problem Relation Age of Onset   Ovarian cancer Mother        Dr. Darnelle Catalan    Arthritis Mother    Obesity  Mother    Stroke Father    Alzheimer's disease Father        early, states also parkinsons. died 24   Hypertension Father    Obesity Father    Colon cancer Paternal Grandmother    Liver disease Daughter        On hospice age 75   Esophageal cancer Neg Hx    Stomach cancer Neg Hx    Rectal cancer Neg Hx    Social History   Socioeconomic History   Marital status: Divorced    Spouse name:  Al Bunge   Number of children: 4   Years of education: Not on file   Highest education level: Associate degree: academic program  Occupational History   Occupation: retired  Tobacco Use   Smoking status: Former    Current packs/day: 0.00    Average packs/day: 1 pack/day for 35.0 years (35.0 ttl pk-yrs)    Types: Cigarettes    Start date: 09/01/1978    Quit date: 09/01/2013    Years since quitting: 9.8   Smokeless tobacco: Never   Tobacco comments:    Former smoker with no desire to smoke again. Smoking years edited at patients request to include periods when she did not smoke.  Vaping Use   Vaping status: Never Used  Substance and Sexual Activity   Alcohol use: Not Currently    Alcohol/week: 1.0 standard drink of alcohol    Types: 1 Shots of liquor per week    Comment: occasional   Drug use: No   Sexual activity: Not on file  Other Topics Concern   Not on file  Social History Narrative   Divorced. Found love of her life recently in 2016 (Al). 3 sons. 5 grandkids.    Daughter (had at age 59 and was adopted). To care for Al [place] her granddaughter when daughter passes- daughter on hospice.       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      Hobbies: grandchildren, personal training at [employer], time with dog   Social Determinants of Health   Financial Resource Strain: Low Risk  (06/21/2023)   Overall Financial Resource Strain (CARDIA)    Difficulty of Paying Living  Expenses: Not hard at all  Food Insecurity: No Food Insecurity (06/21/2023)   Hunger Vital Sign    Worried About Running Out of Food in the Last Year: Never true    Ran Out of Food in the Last Year: Never true  Transportation Needs: No Transportation Needs (06/21/2023)   PRAPARE - Administrator, Civil Service (Medical): No    Lack of Transportation (Non-Medical): No  Physical Activity: Inactive (06/21/2023)   Exercise Vital Sign    Days of Exercise per Week: 0 days    Minutes of Exercise per Session: 10 min  Stress: No Stress Concern Present (06/21/2023)   [employer] of Occupational Health - Occupational Stress Questionnaire    Feeling of Stress : Only a little  Social Connections: Moderately Integrated (06/21/2023)   Social Connection and Isolation Panel [NHANES]    Frequency of Communication with Friends and Family: More than three times a week    Frequency of Social Gatherings with Friends and Family: Three times a week    Attends Religious Services: More than 4 times per year    Active Member of Clubs or Organizations: Yes    Attends Banker Meetings: More than 4 times per year    Marital Status: Divorced    Tobacco Counseling Counseling given: Not Answered Tobacco comments: Former smoker with no desire to smoke again. Smoking years edited at patients request to include periods when she did not smoke.   Clinical Intake:  Pre-visit preparation completed: Yes  Pain : No/denies pain     BMI - recorded: 41.21 Nutritional Status: BMI > 30  Obese Nutritional Risks: None Diabetes: No  How often do you need to have someone help you when you read  instructions, pamphlets, or other written materials from your doctor or pharmacy?: 2 - Rarely  Interpreter Needed?: No  Information entered by :: Lanier Ensign, LPN   Activities of Daily Living    06/21/2023   11:12 AM  In your present state of health, do you have any difficulty performing the  following activities:  Hearing? 1  Comment has hearing aids  Vision? 0  Difficulty concentrating or making decisions? 0  Walking or climbing stairs? 0  Dressing or bathing? 0  Doing errands, shopping? 0  Preparing Food and eating ? N  Using the Toilet? N  In the past six months, have you accidently leaked urine? Y  Comment weras a pad  Do you have problems with loss of bowel control? N  Managing your Medications? N  Managing your Finances? N  Housekeeping or managing your Housekeeping? N    Patient Care Team: Shelva Majestic, MD as PCP - General (Family Medicine) Ernesto Rutherford, MD as Consulting Physician (Ophthalmology)  Indicate any recent Medical Services you may have received from other than [facility] providers in the past year (date may be approximate).     Assessment:   This is a routine wellness examination for Khristen.  Hearing/Vision screen Hearing Screening - Comments:: Pt wears hearing aids  Vision Screening - Comments:: Pt follows up with Dr Dione Booze for annual eye exams   Dietary issues and exercise activities discussed:     Goals Addressed             This Visit's Progress    Patient Stated       Get rid of cough       Depression Screen    06/21/2023    1:12 PM 05/18/2023    3:55 PM 02/13/2023   11:18 AM 10/20/2022    2:08 PM 06/02/2022    1:06 PM 05/30/2022    8:57 AM 02/27/2022    1:35 PM  PHQ 2/9 Scores  PHQ - 2 Score 0 0 1 0 1 2 1   PHQ- 9 Score 0 0 2 0  2 1    Fall Risk    06/21/2023   11:12 AM 02/13/2023   11:18 AM 10/20/2022    2:07 PM 06/02/2022    1:11 PM 02/27/2022    1:35 PM  Fall Risk   Falls in the past year? 0 0 0 0 0  Number falls in past yr: 0 0 0 0 0  Injury with Fall? 0 0 0 0 0  Risk for fall due to : Impaired vision No Fall Risks No Fall Risks Impaired vision No Fall Risks  Follow up Falls prevention discussed Falls evaluation completed Falls evaluation completed Falls prevention discussed Falls evaluation completed     MEDICARE RISK AT HOME:   TIMED UP AND GO:  Was the test performed?  No    Cognitive Function:        06/21/2023    1:15 PM 06/02/2022    1:24 PM  6CIT Screen  What Year? 0 points 0 points  What month? 0 points 0 points  What time? 0 points 0 points  Count back from 20 0 points 0 points  Months in reverse 0 points 0 points  Repeat phrase 0 points 0 points  Total Score 0 points 0 points    Immunizations Immunization History  Administered Date(s) Administered   Influenza, High Dose Seasonal PF 09/29/2016, 09/05/2017, 08/19/2018   Influenza,inj,Quad PF,6+ Mos 08/24/2015   Influenza,inj,quad, With Preservative 08/27/2017  Moderna Sars-Covid-2 Vaccination 02/14/2020, 03/07/2020   Pneumococcal Conjugate-13 03/12/2015   Pneumococcal Polysaccharide-23 01/09/2017   Td 04/24/2016   Zoster, Live 03/12/2015    TDAP status: Up to date  Flu Vaccine status: Up to date  Pneumococcal vaccine status: Up to date  Covid-19 vaccine status: Completed vaccines  Qualifies for Shingles Vaccine? Yes   Zostavax completed No   Shingrix Completed?: No.    Education has been provided regarding the importance of this vaccine. Patient has been advised to call insurance company to determine out of pocket expense if they have not yet received this vaccine. Advised may also receive vaccine at local pharmacy or Health Dept. Verbalized acceptance and understanding.  Screening Tests Health Maintenance  Topic Date Due   Zoster Vaccines- Shingrix (1 of 2) 01/30/2024 (Originally 05/27/1997)   INFLUENZA VACCINE  07/05/2023   MAMMOGRAM  07/11/2023   Lung Cancer Screening  05/13/2024   Medicare Annual Wellness (AWV)  06/20/2024   DTaP/Tdap/Td (2 - Tdap) 04/24/2026   Colonoscopy  10/18/2027   Pneumonia Vaccine 54+ Years old  Completed   DEXA SCAN  Completed   Hepatitis C Screening  Completed   HPV VACCINES  Aged Out   COVID-19 Vaccine  Discontinued    Health Maintenance  There are no  preventive care reminders to display for this patient.   Colorectal cancer screening: Type of screening: Colonoscopy. Completed 10/17/22. Repeat every 5 years  Mammogram status: Completed 07/10/22. Repeat every year  Bone Density status: Completed 09/11/17. Results reflect: Bone density results: NORMAL. Repeat every 2 years.  Lung Cancer Screening: (Low Dose CT Chest recommended if Age 74-80 years, 20 pack-year currently smoking OR have quit w/in 15years.) does qualify.   Lung Cancer Screening Referral: 05/14/23 for CT angi chest completed   Additional Screening:  Hepatitis C Screening:  Completed 01/18/17  Vision Screening: Recommended annual ophthalmology exams for early detection of glaucoma and other disorders of the eye. Is the patient up to date with their annual eye exam?  Yes  Who is the provider or what is the name of the office in which the patient attends annual eye exams? Dr Dione Booze  If pt is not established with a provider, would they like to be referred to a provider to establish care? No .   Dental Screening: Recommended annual dental exams for proper oral hygiene  Community Resource Referral / Chronic Care Management: CRR required this visit?  No   CCM required this visit?  No     Plan:     I have personally reviewed and noted the following in the patient's chart:   Medical and social history Use of alcohol, tobacco or illicit drugs  Current medications and supplements including opioid prescriptions. Patient is currently taking opioid prescriptions. Information provided to patient regarding non-opioid alternatives. Patient advised to discuss non-opioid treatment plan with their provider. Functional ability and status Nutritional status Physical activity Advanced directives List of other physicians Hospitalizations, surgeries, and ER visits in previous 12 months Vitals Screenings to include cognitive, depression, and falls Referrals and appointments  In  addition, I have reviewed and discussed with patient certain preventive protocols, quality metrics, and best practice recommendations. A written personalized care plan for preventive services as well as general preventive health recommendations were provided to patient.     Marzella Schlein, LPN   03/12/8118   After Visit Summary: (MyChart) Due to this being a telephonic visit, the after visit summary with patients personalized plan was offered to patient  via MyChart   Nurse Notes: none

## 2023-06-21 NOTE — Patient Instructions (Signed)
Angie Velasquez , Thank you for taking time to come for your Medicare Wellness Visit. I appreciate your ongoing commitment to your health goals. Please review the following plan we discussed and let me know if I can assist you in the future.   These are the goals we discussed:  Goals      Patient Stated     Lose weight      Patient Stated     Get rid of cough        This is a list of the screening recommended for you and due dates:  Health Maintenance  Topic Date Due   Zoster (Shingles) Vaccine (1 of 2) 01/30/2024*   Flu Shot  07/05/2023   Mammogram  07/11/2023   Screening for Lung Cancer  05/13/2024   Medicare Annual Wellness Visit  06/20/2024   DTaP/Tdap/Td vaccine (2 - Tdap) 04/24/2026   Colon Cancer Screening  10/18/2027   Pneumonia Vaccine  Completed   DEXA scan (bone density measurement)  Completed   Hepatitis C Screening  Completed   HPV Vaccine  Aged Out   COVID-19 Vaccine  Discontinued  *Topic was postponed. The date shown is not the original due date.    Advanced directives: Please bring a copy of your health care power of attorney and living will to the office at your convenience.  Conditions/risks identified: get rid of cough   Next appointment: Follow up in one year for your annual wellness visit     Preventive Care 65 Years and Older, Female Preventive care refers to lifestyle choices and visits with your health care provider that can promote health and wellness. What does preventive care include? A yearly physical exam. This is also called an annual well check. Dental exams once or twice a year. Routine eye exams. Ask your health care provider how often you should have your eyes checked. Personal lifestyle choices, including: Daily care of your teeth and gums. Regular physical activity. Eating a healthy diet. Avoiding tobacco and drug use. Limiting alcohol use. Practicing safe sex. Taking low-dose aspirin every day. Taking vitamin and mineral  supplements as recommended by your health care provider. What happens during an annual well check? The services and screenings done by your health care provider during your annual well check will depend on your age, overall health, lifestyle risk factors, and family history of disease. Counseling  Your health care provider may ask you questions about your: Alcohol use. Tobacco use. Drug use. Emotional well-being. Home and relationship well-being. Sexual activity. Eating habits. History of falls. Memory and ability to understand (cognition). Work and work Astronomer. Reproductive health. Screening  You may have the following tests or measurements: Height, weight, and BMI. Blood pressure. Lipid and cholesterol levels. These may be checked every 5 years, or more frequently if you are over 78 years old. Skin check. Lung cancer screening. You may have this screening every year starting at age 60 if you have a 30-pack-year history of smoking and currently smoke or have quit within the past 15 years. Fecal occult blood test (FOBT) of the stool. You may have this test every year starting at age 59. Flexible sigmoidoscopy or colonoscopy. You may have a sigmoidoscopy every 5 years or a colonoscopy every 10 years starting at age 81. Hepatitis C blood test. Hepatitis B blood test. Sexually transmitted disease (STD) testing. Diabetes screening. This is done by checking your blood sugar (glucose) after you have not eaten for a while (fasting). You may have this  done every 1-3 years. Bone density scan. This is done to screen for osteoporosis. You may have this done starting at age 91. Mammogram. This may be done every 1-2 years. Talk to your health care provider about how often you should have regular mammograms. Talk with your health care provider about your test results, treatment options, and if necessary, the need for more tests. Vaccines  Your health care provider may recommend certain  vaccines, such as: Influenza vaccine. This is recommended every year. Tetanus, diphtheria, and acellular pertussis (Tdap, Td) vaccine. You may need a Td booster every 10 years. Zoster vaccine. You may need this after age 55. Pneumococcal 13-valent conjugate (PCV13) vaccine. One dose is recommended after age 28. Pneumococcal polysaccharide (PPSV23) vaccine. One dose is recommended after age 58. Talk to your health care provider about which screenings and vaccines you need and how often you need them. This information is not intended to replace advice given to you by your health care provider. Make sure you discuss any questions you have with your health care provider. Document Released: 12/17/2015 Document Revised: 08/09/2016 Document Reviewed: 09/21/2015 Elsevier Interactive Patient Education  2017 [employer].  Fall Prevention in the Home Falls can cause injuries. They can happen to people of all ages. There are many things you can do to make your home safe and to help prevent falls. What can I do on the outside of my home? Regularly fix the edges of walkways and driveways and fix any cracks. Remove anything that might make you trip as you walk through a door, such as a raised step or threshold. Trim any bushes or trees on the path to your home. Use bright outdoor lighting. Clear any walking paths of anything that might make someone trip, such as rocks or tools. Regularly check to see if handrails are loose or broken. Make sure that both sides of any steps have handrails. Any raised decks and porches should have guardrails on the edges. Have any leaves, snow, or ice cleared regularly. Use sand or salt on walking paths during winter. Clean up any spills in your garage right away. This includes oil or grease spills. What can I do in the bathroom? Use night lights. Install grab bars by the toilet and in the tub and shower. Do not use towel bars as grab bars. Use non-skid mats or decals in  the tub or shower. If you need to sit down in the shower, use a plastic, non-slip stool. Keep the floor dry. Clean up any water that spills on the floor as soon as it happens. Remove soap buildup in the tub or shower regularly. Attach bath mats securely with double-sided non-slip rug tape. Do not have throw rugs and other things on the floor that can make you trip. What can I do in the bedroom? Use night lights. Make sure that you have a light by your bed that is easy to reach. Do not use any sheets or blankets that are too big for your bed. They should not hang down onto the floor. Have a firm chair that has side arms. You can use this for support while you get dressed. Do not have throw rugs and other things on the floor that can make you trip. What can I do in the kitchen? Clean up any spills right away. Avoid walking on wet floors. Keep items that you use a lot in easy-to-reach places. If you need to reach something above you, use a strong step stool that  has a grab bar. Keep electrical cords out of the way. Do not use floor polish or wax that makes floors slippery. If you must use wax, use non-skid floor wax. Do not have throw rugs and other things on the floor that can make you trip. What can I do with my stairs? Do not leave any items on the stairs. Make sure that there are handrails on both sides of the stairs and use them. Fix handrails that are broken or loose. Make sure that handrails are as long as the stairways. Check any carpeting to make sure that it is firmly attached to the stairs. Fix any carpet that is loose or worn. Avoid having throw rugs at the top or bottom of the stairs. If you do have throw rugs, attach them to the floor with carpet tape. Make sure that you have a light switch at the top of the stairs and the bottom of the stairs. If you do not have them, ask someone to add them for you. What else can I do to help prevent falls? Wear shoes that: Do not have high  heels. Have rubber bottoms. Are comfortable and fit you well. Are closed at the toe. Do not wear sandals. If you use a stepladder: Make sure that it is fully opened. Do not climb a closed stepladder. Make sure that both sides of the stepladder are locked into place. Ask someone to hold it for you, if possible. Clearly mark and make sure that you can see: Any grab bars or handrails. First and last steps. Where the edge of each step is. Use tools that help you move around (mobility aids) if they are needed. These include: Canes. Walkers. Scooters. Crutches. Turn on the lights when you go into a dark area. Replace any light bulbs as soon as they burn out. Set up your furniture so you have a clear path. Avoid moving your furniture around. If any of your floors are uneven, fix them. If there are any pets around you, be aware of where they are. Review your medicines with your doctor. Some medicines can make you feel dizzy. This can increase your chance of falling. Ask your doctor what other things that you can do to help prevent falls. This information is not intended to replace advice given to you by your health care provider. Make sure you discuss any questions you have with your health care provider. Document Released: 09/16/2009 Document Revised: 04/27/2016 Document Reviewed: 12/25/2014 Elsevier Interactive Patient Education  2017 [employer].

## 2023-06-22 ENCOUNTER — Encounter: Payer: Self-pay | Admitting: Family Medicine

## 2023-06-26 ENCOUNTER — Other Ambulatory Visit: Payer: Self-pay

## 2023-06-26 DIAGNOSIS — J441 Chronic obstructive pulmonary disease with (acute) exacerbation: Secondary | ICD-10-CM

## 2023-06-26 MED ORDER — BENZONATATE 100 MG PO CAPS
100.0000 mg | ORAL_CAPSULE | Freq: Three times a day (TID) | ORAL | 0 refills | Status: DC | PRN
Start: 2023-06-26 — End: 2023-07-10

## 2023-06-26 MED ORDER — ONDANSETRON 4 MG PO TBDP: ORAL_TABLET | ORAL | 3 refills | Status: DC

## 2023-07-04 ENCOUNTER — Encounter: Payer: Self-pay | Admitting: Family Medicine

## 2023-07-04 DIAGNOSIS — G4733 Obstructive sleep apnea (adult) (pediatric): Secondary | ICD-10-CM | POA: Diagnosis not present

## 2023-07-05 ENCOUNTER — Ambulatory Visit: Payer: Medicare HMO | Admitting: Family

## 2023-07-05 VITALS — BP 94/62 | HR 83 | Temp 97.7°F | Ht 68.0 in | Wt 269.4 lb

## 2023-07-05 DIAGNOSIS — B369 Superficial mycosis, unspecified: Secondary | ICD-10-CM

## 2023-07-05 DIAGNOSIS — R300 Dysuria: Secondary | ICD-10-CM

## 2023-07-05 DIAGNOSIS — J3489 Other specified disorders of nose and nasal sinuses: Secondary | ICD-10-CM | POA: Diagnosis not present

## 2023-07-05 MED ORDER — TRIAMCINOLONE ACETONIDE 55 MCG/ACT NA AERO
1.0000 | INHALATION_SPRAY | Freq: Every day | NASAL | 2 refills | Status: AC
Start: 2023-07-05 — End: ?

## 2023-07-05 MED ORDER — CLOTRIMAZOLE POWD
1.0000 | Freq: Two times a day (BID) | 1 refills | Status: AC
Start: 2023-07-05 — End: ?

## 2023-07-05 MED ORDER — CLOTRIMAZOLE-BETAMETHASONE 1-0.05 % EX CREA
1.0000 | TOPICAL_CREAM | Freq: Two times a day (BID) | CUTANEOUS | 1 refills | Status: DC
Start: 2023-07-05 — End: 2023-09-18

## 2023-07-05 NOTE — Progress Notes (Signed)
Patient ID: Angie Velasquez, female    DOB: 10/29/1946, 77 y.o.   MRN: [id number]  Chief Complaint  Patient presents with   Cough    Pt c/o Cough with sinus pain. Present for 2 months.Has tried Claritin D which did help slightly. ENT on 9/9.    Rash    Pt c/o rash in groin area since last night. Pt states rash is red and itchy, has tried nystatin and baking soda which did not help.    Dysuria    Pt [place]/o dysuria and lower abdominal pain. Pt tried doxycycline which did help sx.     HPI:      Skin rash:  reports having  under her belly and both sides of her groin, she has had in past and usually Nystatin cream which usually works, but not this time.   Persistent cough and sinus pain:  pt reports haven taken Augmentin and DOXY with last sinus infections. Reports cough has been off & on and she never knows when it starts and she can't stop coughing when it does. And has appt with ENT & Pulmonary September.   Dysuria:  with lower abd pain and frequency, urgency. Denies fever, low back pain, hematuria or foul odor. States she has taken 4 pills of left over DOXY and feels better.  Assessment & Plan   1. Sinus pain- pt using astelin nasal spray, but still having pain in frontal sinuses, does not remember using a steroid nasal spray in past, sending Nasacort, advised on use & SE, continue Astelin spray. Also recommend saline nasal spray tid & prior to using Nasacort.  - triamcinolone (NASACORT) 55 MCG/ACT AERO nasal inhaler; Place 1 spray into the nose daily. Start with 1 spray each side twice a day for 3 days, then reduce to daily.  Dispense: 1 each; Refill: 2  2. Fungal skin infection sending clotrimazole powder & cream, advised on use & SE, importance of keeping skin dry to avoid future infections.  - clotrimazole-betamethasone (LOTRISONE) cream; Apply 1 Application topically 2 (two) times daily. Dry skin well first, then apply to rash under your stomach and groin areas.  Dispense:  15 g; Refill: 1 - Clotrimazole POWD; 1 application  by Does not apply route 2 (two) times daily. Apply after applying Clotrimazole cream.  Dispense: 25 g; Refill: 1  3. Dysuria not checking UA or culture d/t abt already taken; advised ok to finish the DOXY she has (3 more pills). If sx return after finishing abt, she can message or call me.   Subjective:    Outpatient Medications Prior to Visit  Medication Sig Dispense Refill   albuterol (VENTOLIN HFA) 108 (90 Base) MCG/ACT inhaler Inhale 2 puffs into the lungs every 6 (six) hours as needed for wheezing or shortness of breath. 18 g 2   allopurinol (ZYLOPRIM) 300 MG tablet TAKE 1 TABLET BY MOUTH 2 TIMES DAILY. 180 tablet 2   benzonatate (TESSALON PERLES) 100 MG capsule Take 1 capsule (100 mg total) by mouth 3 (three) times daily as needed. 20 capsule 0   budesonide-formoterol (SYMBICORT) 160-4.5 MCG/ACT inhaler Inhale 2 puffs into the lungs 2 (two) times daily. 1 each 1   CALCIUM-MAGNESIUM-ZINC PO Take by mouth.     clonazePAM (KLONOPIN) 1 MG tablet TAKE 0.5- 1 TABLET(0.5 MG- 1 mg) BY MOUTH AT BEDTIME AS NEEDED. Do not drive for at least 8 hours after taking 90 tablet 1   CRANBERRY PO Take by mouth.  diazepam (VALIUM) 10 MG tablet Take 1 tablet (10 mg total) by mouth every 6 (six) hours as needed (as needed for muscle spasms). 30 tablet 0   diclofenac sodium (VOLTAREN) 1 % GEL Apply topically to affected area qid 100 g 2   Ginger, Zingiber officinalis, (GINGER ROOT) 550 MG CAPS Take by mouth.     ipratropium (ATROVENT) 0.03 % nasal spray Place 2 sprays into both nostrils every 12 (twelve) hours. 30 mL 12   lisinopril (ZESTRIL) 5 MG tablet Take 1 tablet (5 mg total) by mouth daily. 90 tablet 3   nystatin cream (MYCOSTATIN) APPLY 1 APPLICATION TOPICALLY TO RASH IN GROIN TWICE DAILY FOR UP TO 2 WEEKS MAXIMUM 90 g 2   omeprazole (PRILOSEC) 20 MG capsule Take 1 capsule (20 mg total) by mouth 2 (two) times daily before a meal. 180 capsule 3    ondansetron (ZOFRAN-ODT) 4 MG disintegrating tablet TAKE 1 TABLET BY MOUTH EVERY 8 HOURS AS NEEDED FOR NAUSEA AND VOMITING 20 tablet 3   OVER THE COUNTER MEDICATION Cherry extract bid     OVER THE COUNTER MEDICATION B-12 one daily     rosuvastatin (CRESTOR) 10 MG tablet Take 1 tablet (10 mg total) by mouth daily. 30 tablet 5   sertraline (ZOLOFT) 50 MG tablet TAKE 1 TABLET BY MOUTH EVERY DAY 90 tablet 1   Sodium Bicarbonate POWD by Does not apply route.     traMADol (ULTRAM) 50 MG tablet Take 1 tablet (50 mg total) by mouth every 6 (six) hours as needed for moderate pain or severe pain. 60 tablet 0   triamcinolone cream (KENALOG) 0.1 % Apply 1 application topically 2 (two) times daily. For 7-10 days for recurring eczema issues 80 g 1   TURMERIC PO Take by mouth.     valACYclovir (VALTREX) 1000 MG tablet Take 2 pills twice a day for 1 day at first sign of cold sore 28 tablet 1   amoxicillin-clavulanate (AUGMENTIN) 875-125 MG tablet Take 1 tablet by mouth 2 (two) times daily. 20 tablet 0   No facility-administered medications prior to visit.   Past Medical History:  Diagnosis Date   Anxiety    years ago - no longer an issue   Arthritis    gout   Cataracts, bilateral    Complication of anesthesia    Fracture of pelvis (HCC)    GERD (gastroesophageal reflux disease)    Hx of chest pain    "anxiety"   Hyperlipidemia    INSOMNIA 10/14/2008   in past   Joint pain    Kidney problem    Low blood sugar    eats several smaller meals a day   Obesity    Palpitations    Stress related    Proteinuria    H/O   Tobacco abuse    quit smoking 08/2013   Past Surgical History:  Procedure Laterality Date   ABDOMINAL HYSTERECTOMY     APPENDECTOMY     with hysterectomy   childbirth     x4   CHOLECYSTECTOMY  2001   EYE SURGERY Bilateral 2015   cataract surgery with lens implant   NEPHRECTOMY  1992   complex cystic mass - nephrectomy right   ORIF ANKLE FRACTURE Right 07/02/2014   Procedure:  OPEN REDUCTION INTERNAL FIXATION (ORIF) RIGHT ANKLE BIMALLOELAR FRACTURE;  Surgeon: Toni Arthurs, MD;  Location: [facility] OR;  Service: Orthopedics;  Laterality: Right;   OTHER SURGICAL HISTORY     hysterectomy, R ovary remains  SHOULDER SURGERY     bone spurs left   Allergies  Allergen Reactions   Epinephrine Palpitations   Codeine Nausea Only   Ozempic (0.25 Or 0.5 Mg-Dose) [Semaglutide(0.25 Or 0.5mg -Dos)]     I have had a terrible reaction with Ozempic. I was on vacation at [facility] and started vomiting and gas pains like I was in labor. On Sunday I started passing bright red blood with bowel movements. I drove back yesterday. I have not eaten anything solid for 2 days. I am not going to continue taking this.    Penicillins Nausea Only   Prednisone Cough    In 2024 took this again and tolerated well      Objective:    Physical Exam Vitals and nursing note reviewed.  Constitutional:      Appearance: Normal appearance.  HENT:     Right Ear: Tympanic membrane and ear canal normal.     Left Ear: Tympanic membrane and ear canal normal.     Nose:     Right Sinus: Frontal sinus tenderness present. No maxillary sinus tenderness.     Left Sinus: Frontal sinus tenderness present. No maxillary sinus tenderness.  Cardiovascular:     Rate and Rhythm: Normal rate and regular rhythm.  Pulmonary:     Effort: Pulmonary effort is normal.     Breath sounds: Normal breath sounds.  Musculoskeletal:        General: Normal range of motion.  Lymphadenopathy:     Head:     Right side of head: No submandibular, tonsillar, preauricular, posterior auricular or occipital adenopathy.     Left side of head: No submandibular, tonsillar, preauricular, posterior auricular or occipital adenopathy.     Cervical: No cervical adenopathy.     Right cervical: No superficial cervical adenopathy.    Left cervical: No superficial cervical adenopathy.  Skin:    General: Skin is warm and dry.     Findings: Rash  (mild erythema noted under abdominal fold and bilateral groin) present.  Neurological:     Mental Status: She is alert.  Psychiatric:        Mood and Affect: Mood normal.        Behavior: Behavior normal.    BP (!) 89/64 (BP Location: Left Arm, Patient Position: Sitting, Cuff Size: Large)   Pulse 83   Temp 97.7 F (36.5 C) (Temporal)   Ht 5\' 8"  (1.727 m)   Wt 269 lb 6 oz (122.2 kg)   SpO2 91%   BMI 40.96 kg/m  Wt Readings from Last 3 Encounters:  07/05/23 269 lb 6 oz (122.2 kg)  06/21/23 271 lb (122.9 kg)  06/04/23 271 lb 6.4 oz (123.1 kg)       Dulce Sellar, NP

## 2023-07-05 NOTE — Patient Instructions (Addendum)
It was very nice to see you today!   I have sent over a steroid nasal spray. Use as directed on the bottle.  I also have sent a new antifungal cream and powder to use on your groin and belly rash. Continue using the Astelin nasal spray as you have been.  Continue the Doxycycline for your urine symptoms.   Call back if your symptoms are not improving.       PLEASE NOTE:  If you had any lab tests please let us know if you have not heard back within a few days. You may see your results on MyChart before we have a chance to review them but we will give you a call once they are reviewed by [name]. If we ordered any referrals today, please let us know if you have not heard from their office within the next week.

## 2023-07-10 ENCOUNTER — Other Ambulatory Visit: Payer: Self-pay | Admitting: Family Medicine

## 2023-07-10 ENCOUNTER — Encounter: Payer: Self-pay | Admitting: Family Medicine

## 2023-07-10 DIAGNOSIS — J441 Chronic obstructive pulmonary disease with (acute) exacerbation: Secondary | ICD-10-CM

## 2023-07-10 MED ORDER — BENZONATATE 100 MG PO CAPS: 100.0000 mg | ORAL_CAPSULE | Freq: Three times a day (TID) | ORAL | 0 refills | Status: DC | PRN

## 2023-07-10 MED ORDER — TRAMADOL HCL 50 MG PO TABS: 50.0000 mg | ORAL_TABLET | Freq: Four times a day (QID) | ORAL | 0 refills | Status: DC | PRN

## 2023-07-25 ENCOUNTER — Other Ambulatory Visit: Payer: Self-pay | Admitting: Family Medicine

## 2023-07-30 ENCOUNTER — Other Ambulatory Visit: Payer: Self-pay | Admitting: Family Medicine

## 2023-07-30 ENCOUNTER — Encounter: Payer: Self-pay | Admitting: Family Medicine

## 2023-07-30 DIAGNOSIS — J441 Chronic obstructive pulmonary disease with (acute) exacerbation: Secondary | ICD-10-CM

## 2023-07-31 ENCOUNTER — Other Ambulatory Visit: Payer: Self-pay

## 2023-07-31 DIAGNOSIS — J441 Chronic obstructive pulmonary disease with (acute) exacerbation: Secondary | ICD-10-CM

## 2023-07-31 MED ORDER — BENZONATATE 100 MG PO CAPS
100.0000 mg | ORAL_CAPSULE | Freq: Three times a day (TID) | ORAL | 0 refills | Status: DC | PRN
Start: 2023-07-31 — End: 2023-12-10

## 2023-08-08 NOTE — Progress Notes (Deleted)
Angie Velasquez, female    DOB: August 29, 1946   MRN: [id number]   Brief patient profile:  54  *** referred to pulmonary clinic 08/09/2023 by *** for ***        History of Present Illness  08/09/2023  Pulmonary/ 1st office eval/Suesan Mohrmann  No chief complaint on file.    Dyspnea:  *** Cough: *** Sleep: *** SABA use:     Outpatient Medications Prior to Visit  Medication Sig Dispense Refill   albuterol (VENTOLIN HFA) 108 (90 Base) MCG/ACT inhaler Inhale 2 puffs into the lungs every 6 (six) hours as needed for wheezing or shortness of breath. 18 g 2   allopurinol (ZYLOPRIM) 300 MG tablet TAKE 1 TABLET BY MOUTH 2 TIMES DAILY. 180 tablet 2   benzonatate (TESSALON PERLES) 100 MG capsule Take 1 capsule (100 mg total) by mouth 3 (three) times daily as needed. 60 capsule 0   budesonide-formoterol (SYMBICORT) 160-4.5 MCG/ACT inhaler Inhale 2 puffs into the lungs 2 (two) times daily. 1 each 1   CALCIUM-MAGNESIUM-ZINC PO Take by mouth.     clonazePAM (KLONOPIN) 1 MG tablet TAKE 0.5- 1 TABLET(0.5 MG- 1 mg) BY MOUTH AT BEDTIME AS NEEDED. Do not drive for at least 8 hours after taking 90 tablet 1   Clotrimazole POWD 1 application  by Does not apply route 2 (two) times daily. Apply after applying Clotrimazole cream. 25 g 1   clotrimazole-betamethasone (LOTRISONE) cream Apply 1 Application topically 2 (two) times daily. Dry skin well first, then apply to rash under your stomach and groin areas. 15 g 1   CRANBERRY PO Take by mouth.     diazepam (VALIUM) 10 MG tablet Take 1 tablet (10 mg total) by mouth every 6 (six) hours as needed (as needed for muscle spasms). 30 tablet 0   diclofenac sodium (VOLTAREN) 1 % GEL Apply topically to affected area qid 100 g 2   Ginger, Zingiber officinalis, (GINGER ROOT) 550 MG CAPS Take by mouth.     ipratropium (ATROVENT) 0.03 % nasal spray Place 2 sprays into both nostrils every 12 (twelve) hours. 30 mL 12   lisinopril (ZESTRIL) 5 MG tablet Take 1 tablet (5 mg  total) by mouth daily. 90 tablet 3   omeprazole (PRILOSEC) 20 MG capsule Take 1 capsule (20 mg total) by mouth 2 (two) times daily before a meal. 180 capsule 3   ondansetron (ZOFRAN-ODT) 4 MG disintegrating tablet TAKE 1 TABLET BY MOUTH EVERY 8 HOURS AS NEEDED FOR NAUSEA AND VOMITING 20 tablet 3   OVER THE COUNTER MEDICATION Cherry extract bid     OVER THE COUNTER MEDICATION B-12 one daily     rosuvastatin (CRESTOR) 10 MG tablet TAKE 1 TABLET BY MOUTH EVERY DAY 90 tablet 1   sertraline (ZOLOFT) 50 MG tablet TAKE 1 TABLET BY MOUTH EVERY DAY 90 tablet 1   Sodium Bicarbonate POWD by Does not apply route.     traMADol (ULTRAM) 50 MG tablet Take 1 tablet (50 mg total) by mouth every 6 (six) hours as needed for moderate pain or severe pain (Do not drive for 6 hours after taking). 60 tablet 0   triamcinolone (NASACORT) 55 MCG/ACT AERO nasal inhaler Place 1 spray into the nose daily. Start with 1 spray each side twice a day for 3 days, then reduce to daily. 1 each 2   triamcinolone cream (KENALOG) 0.1 % Apply 1 application topically 2 (two) times daily. For 7-10 days for recurring eczema issues 80 g 1  TURMERIC PO Take by mouth.     valACYclovir (VALTREX) 1000 MG tablet Take 2 pills twice a day for 1 day at first sign of cold sore 28 tablet 1   No facility-administered medications prior to visit.    Past Medical History:  Diagnosis Date   Anxiety    years ago - no longer an issue   Arthritis    gout   Cataracts, bilateral    Complication of anesthesia    Fracture of pelvis (HCC)    GERD (gastroesophageal reflux disease)    Hx of chest pain    "anxiety"   Hyperlipidemia    INSOMNIA 10/14/2008   in past   Joint pain    Kidney problem    Low blood sugar    eats several smaller meals a day   Obesity    Palpitations    Stress related    Proteinuria    H/O   Tobacco abuse    quit smoking 08/2013      Objective:     There were no vitals taken for this visit.          Assessment   No problem-specific Assessment & Plan notes found for this encounter.     Sandrea Hughs, MD 08/08/2023

## 2023-08-09 ENCOUNTER — Other Ambulatory Visit: Payer: Self-pay | Admitting: Family Medicine

## 2023-08-09 ENCOUNTER — Other Ambulatory Visit: Payer: Self-pay

## 2023-08-09 ENCOUNTER — Institutional Professional Consult (permissible substitution): Payer: Medicare HMO | Admitting: Internal Medicine

## 2023-08-09 ENCOUNTER — Encounter: Payer: Self-pay | Admitting: Internal Medicine

## 2023-08-09 ENCOUNTER — Encounter: Payer: Self-pay | Admitting: Family Medicine

## 2023-08-09 ENCOUNTER — Ambulatory Visit: Payer: Medicare HMO | Admitting: Internal Medicine

## 2023-08-09 VITALS — BP 138/80 | HR 87 | Temp 99.1°F | Ht 67.0 in | Wt 274.0 lb

## 2023-08-09 DIAGNOSIS — R058 Other specified cough: Secondary | ICD-10-CM

## 2023-08-09 DIAGNOSIS — I10 Essential (primary) hypertension: Secondary | ICD-10-CM | POA: Diagnosis not present

## 2023-08-09 DIAGNOSIS — Z87891 Personal history of nicotine dependence: Secondary | ICD-10-CM

## 2023-08-09 DIAGNOSIS — N644 Mastodynia: Secondary | ICD-10-CM

## 2023-08-09 DIAGNOSIS — Z1231 Encounter for screening mammogram for malignant neoplasm of breast: Secondary | ICD-10-CM

## 2023-08-09 MED ORDER — VALSARTAN 80 MG PO TABS: 80.0000 mg | ORAL_TABLET | Freq: Every day | ORAL | 11 refills | Status: DC

## 2023-08-09 NOTE — Progress Notes (Signed)
Angie Velasquez, female    DOB: 1945/12/26   MRN: [id number]   Brief patient profile:  77  yowf quit smoking 08/2013 no resp cc and nl pfts around 2022  referred to pulmonary clinic 08/09/2023 by Dr Durene Cal for refractory cough onset was Feb 2024 in setting of severe nausea form intermittent vomiting controlled with zofran         History of Present Illness  08/09/2023  Pulmonary/ 1st office eval/Angie Velasquez  Chief Complaint  Patient presents with   Consult    Cough and choking x 3 months.  Dyspnea:  only when coughs / can be severe to point of gagging  Cough: mostly dry  Sleep: 20 degrees hob and when head hits pillow starts coughing/choking on cpap   SABA use: seems to help but last used  a week prior to OV   but tessalon helps the most   No obvious day to day or daytime pattern/variability or assoc excess/ purulent sputum or mucus plugs or hemoptysis or cp or chest tightness, subjective wheeze or overt sinus or hb symptoms.    Also denies any obvious fluctuation of symptoms with weather or environmental changes or other aggravating or alleviating factors except as outlined above   No unusual exposure hx or h/o childhood pna/ asthma or knowledge of premature birth.  Current Allergies, Complete Past Medical History, Past Surgical History, Family History, and Social History were reviewed in [employer] record.  ROS  The following are not active complaints unless bolded Hoarseness, sore throat, dysphagia, dental problems, itching, sneezing,  nasal congestion or discharge of excess mucus or purulent secretions, ear ache,   fever, chills, sweats, unintended wt loss or wt gain, classically pleuritic or exertional cp,  orthopnea pnd or arm/hand swelling  or leg swelling, presyncope, palpitations, abdominal pain, anorexia, nausea, vomiting, diarrhea  or change in bowel habits or change in bladder habits, change in stools or change in urine, dysuria, hematuria,  rash,  arthralgias, visual complaints, headache, numbness, weakness or ataxia or problems with walking or coordination,  change in mood or  memory.             Outpatient Medications Prior to Visit  Medication Sig Dispense Refill   albuterol (VENTOLIN HFA) 108 (90 Base) MCG/ACT inhaler Inhale 2 puffs into the lungs every 6 (six) hours as needed for wheezing or shortness of breath. 18 g 2   allopurinol (ZYLOPRIM) 300 MG tablet TAKE 1 TABLET BY MOUTH 2 TIMES DAILY. 180 tablet 2   benzonatate (TESSALON PERLES) 100 MG capsule Take 1 capsule (100 mg total) by mouth 3 (three) times daily as needed. 60 capsule 0   budesonide-formoterol (SYMBICORT) 160-4.5 MCG/ACT inhaler Inhale 2 puffs into the lungs 2 (two) times daily. 1 each 1   CALCIUM-MAGNESIUM-ZINC PO Take by mouth.     clonazePAM (KLONOPIN) 1 MG tablet TAKE 0.5- 1 TABLET(0.5 MG- 1 mg) BY MOUTH AT BEDTIME AS NEEDED. Do not drive for at least 8 hours after taking 90 tablet 1   Clotrimazole POWD 1 application  by Does not apply route 2 (two) times daily. Apply after applying Clotrimazole cream. 25 g 1   clotrimazole-betamethasone (LOTRISONE) cream Apply 1 Application topically 2 (two) times daily. Dry skin well first, then apply to rash under your stomach and groin areas. 15 g 1   CRANBERRY PO Take by mouth.     diazepam (VALIUM) 10 MG tablet Take 1 tablet (10 mg total) by mouth every 6 (six)  hours as needed (as needed for muscle spasms). 30 tablet 0   diclofenac sodium (VOLTAREN) 1 % GEL Apply topically to affected area qid 100 g 2   Ginger, Zingiber officinalis, (GINGER ROOT) 550 MG CAPS Take by mouth.     ipratropium (ATROVENT) 0.03 % nasal spray Place 2 sprays into both nostrils every 12 (twelve) hours. 30 mL 12   lisinopril (ZESTRIL) 5 MG tablet Take 1 tablet (5 mg total) by mouth daily. 90 tablet 3   omeprazole (PRILOSEC) 20 MG capsule Take 1 capsule (20 mg total) by mouth 2 (two) times daily before a meal. 180 capsule 3   ondansetron (ZOFRAN-ODT)  4 MG disintegrating tablet TAKE 1 TABLET BY MOUTH EVERY 8 HOURS AS NEEDED FOR NAUSEA AND VOMITING 20 tablet 3   OVER THE COUNTER MEDICATION Cherry extract bid     OVER THE COUNTER MEDICATION B-12 one daily     rosuvastatin (CRESTOR) 10 MG tablet TAKE 1 TABLET BY MOUTH EVERY DAY 90 tablet 1   sertraline (ZOLOFT) 50 MG tablet TAKE 1 TABLET BY MOUTH EVERY DAY 90 tablet 1   Sodium Bicarbonate POWD by Does not apply route.     traMADol (ULTRAM) 50 MG tablet Take 1 tablet (50 mg total) by mouth every 6 (six) hours as needed for moderate pain or severe pain (Do not drive for 6 hours after taking). 60 tablet 0   triamcinolone (NASACORT) 55 MCG/ACT AERO nasal inhaler Place 1 spray into the nose daily. Start with 1 spray each side twice a day for 3 days, then reduce to daily. 1 each 2   triamcinolone cream (KENALOG) 0.1 % Apply 1 application topically 2 (two) times daily. For 7-10 days for recurring eczema issues 80 g 1   TURMERIC PO Take by mouth.     valACYclovir (VALTREX) 1000 MG tablet Take 2 pills twice a day for 1 day at first sign of cold sore 28 tablet 1   No facility-administered medications prior to visit.    Past Medical History:  Diagnosis Date   Anxiety    years ago - no longer an issue   Arthritis    gout   Cataracts, bilateral    Complication of anesthesia    Fracture of pelvis (HCC)    GERD (gastroesophageal reflux disease)    Hx of chest pain    "anxiety"   Hyperlipidemia    INSOMNIA 10/14/2008   in past   Joint pain    Kidney problem    Low blood sugar    eats several smaller meals a day   Obesity    Palpitations    Stress related    Proteinuria    H/O   Tobacco abuse    quit smoking 08/2013      Objective:     BP 138/80 (BP Location: Left Arm, Patient Position: Sitting, Cuff Size: Large)   Pulse 87   Temp 99.1 F (37.3 C) (Oral)   Ht 5\' 7"  (1.702 m)   Wt 274 lb (124.3 kg)   SpO2 96%   BMI 42.91 kg/m   SpO2: 96 % RA  Amb slt hoarse morbidly obese (by  BMI)  pleasant  wf nad    HEENT : Oropharynx  clear      Nasal turbinates nl    NECK :  without  apparent JVD/ palpable Nodes/TM    LUNGS: no acc muscle use,  Nl contour chest which is clear to A and P bilaterally with urge to  cough on  exp maneuvers   CV:  RRR  no s3 or murmur or increase in P2, and no edema   ABD:  obese soft and nontender with nl inspiratory excursion in the supine position. No bruits or organomegaly appreciated   MS:  Nl gait/ ext warm without deformities Or obvious joint restrictions  calf tenderness, cyanosis or clubbing    SKIN: warm and dry without lesions    NEURO:  alert, approp, nl sensorium with  no motor or cerebellar deficits apparent.       . I personally reviewed images and agree with radiology impression as follows:   Chest CTa    05/14/23  . No evidence of pulmonary embolism or other acute intrathoracic findings. 2. Solitary 5 mm subpleural nodule within the right upper lobe. No follow-up needed if patient is low-risk     Assessment   Upper airway cough syndrome Onset feb 2024  - d/c acei 08/09/2023  and max rx for GERD  - 08/09/2023  After extensive coaching inhaler device,  effectiveness =    25% > ok to continue symbicort 160 up to 2 bid x 6 weeks prn though doubt this is asthma  Upper airway cough syndrome (previously labeled PNDS),  is so named because it's frequently impossible to sort out how much is  CR/sinusitis with freq throat clearing (which can be related to primary GERD)   vs  causing  secondary (" extra esophageal")  GERD from wide swings in gastric pressure that occur with throat clearing, often  promoting self use of mint and menthol lozenges that reduce the lower esophageal sphincter tone and exacerbate the problem further in a cyclical fashion.   These are the same pts (now being labeled as having "irritable larynx syndrome" by some cough centers) who not infrequently have a history of having failed to tolerate ace inhibitors,   dry powder inhalers or biphosphonates or report having atypical/extraesophageal reflux symptoms that don't respond to standard doses of PPI  and are easily confused as having aecopd or asthma flares by even experienced allergists/ pulmonologists (myself included).   >>> f/u in 4-6 weeks   Essential hypertension Lisinopril primarily renal protective - changed to valsartan 80 mg one daily with bp 138/80 on lisinopril 5 mg daily due to refractory chronic cough   In the best review of chronic cough to date ( NEJM 2016 375 [id number]) ,  ACEi are now felt to cause cough in up to  20% of pts which is a 4 fold increase from previous reports and does not include the variety of non-specific complaints we see in pulmonary clinic in pts on ACEi but previously attributed to another dx like  Copd/asthma and  include PNDS, throat and chest congestion, "bronchitis", unexplained dyspnea and noct "strangling" and choking sensations, and hoarseness, but also  atypical /refractory GERD symptoms like dysphagia and "bad heartburn"   The only way I know  to prove this is not an "ACEi Case" is a trial off ACEi x a minimum of 6 weeks then regroup.   >>> rx as above, f/u in 2 weeks with bmet/ recheck bp per PCP advised              Former smoker Quit sept 2014. 50 pack years > rec f/u [facility]  05/13/24   Low-dose CT lung cancer screening is recommended for patients who are 58-5 years of age with a 20+ pack-year history of smoking and who are currently smoking or quit <=15  years ago. No coughing up blood  No unintentional weight loss of > 15 pounds in the last 6 months - pt is eligible for scanning yearly until 2029 > deferred to PCP         Each maintenance medication was reviewed in detail including emphasizing most importantly the difference between maintenance and prns and under what circumstances the prns are to be triggered using an action plan format where appropriate.  Total time for H and P, chart  review, counseling, reviewing hfa  device(s) and generating customized AVS unique to this office visit / same day charting = 45 min pt new to me           Sandrea Hughs, MD 08/09/2023

## 2023-08-09 NOTE — Assessment & Plan Note (Signed)
Onset feb 2024  - d/c acei 08/09/2023  and max rx for GERD  - 08/09/2023  After extensive coaching inhaler device,  effectiveness =    25% > ok to continue symbicort 160 up to 2 bid x 6 weeks prn though doubt this is asthma  Upper airway cough syndrome (previously labeled PNDS),  is so named because it's frequently impossible to sort out how much is  CR/sinusitis with freq throat clearing (which can be related to primary GERD)   vs  causing  secondary (" extra esophageal")  GERD from wide swings in gastric pressure that occur with throat clearing, often  promoting self use of mint and menthol lozenges that reduce the lower esophageal sphincter tone and exacerbate the problem further in a cyclical fashion.   These are the same pts (now being labeled as having "irritable larynx syndrome" by some cough centers) who not infrequently have a history of having failed to tolerate ace inhibitors,  dry powder inhalers or biphosphonates or report having atypical/extraesophageal reflux symptoms that don't respond to standard doses of PPI  and are easily confused as having aecopd or asthma flares by even experienced allergists/ pulmonologists (myself included).   >>> f/u in 4-6 weeks

## 2023-08-09 NOTE — Assessment & Plan Note (Addendum)
Lisinopril primarily renal protective - changed to valsartan 80 mg one daily with bp 138/80 on lisinopril 5 mg daily due to refractory chronic cough   In the best review of chronic cough to date ( NEJM 2016 375 [id number]) ,  ACEi are now felt to cause cough in up to  20% of pts which is a 4 fold increase from previous reports and does not include the variety of non-specific complaints we see in pulmonary clinic in pts on ACEi but previously attributed to another dx like  Copd/asthma and  include PNDS, throat and chest congestion, "bronchitis", unexplained dyspnea and noct "strangling" and choking sensations, and hoarseness, but also  atypical /refractory GERD symptoms like dysphagia and "bad heartburn"   The only way I know  to prove this is not an "ACEi Case" is a trial off ACEi x a minimum of 6 weeks then regroup.   >>> rx as above, f/u in 2 weeks with bmet/ recheck bp per PCP advised

## 2023-08-09 NOTE — Patient Instructions (Addendum)
Stop lisinopril and start valsartan 80 mg one daily  - if it's too low break it half   Omeprazole Take 30- 60 min before your first and last meals of the day   For wheeze/ cough / short of breath > symbicort 160  up to 2 puffs every 12 hours if needed   Work on inhaler technique:  relax and gently blow all the way out then take a nice smooth full deep breath back in, triggering the inhaler at same time you start breathing in.  Hold breath in for at least  5 seconds if you can. Blow out symbicort  thru nose. Rinse and gargle with water when done.  If mouth or throat bother you at all,  try brushing teeth/gums/tongue with arm and hammer toothpaste/ make a slurry and gargle and spit out.   For cough > tessalon (benzoate)  as needed up to three times daily   Please schedule a follow up office visit in 4-6  weeks, sooner if needed  with all medications /inhalers/ solutions in hand so we can verify exactly what you are taking. This includes all medications from all doctors and over the counters

## 2023-08-09 NOTE — Assessment & Plan Note (Addendum)
Quit sept 2014. 50 pack years > rec f/u [facility]  05/13/24   Low-dose CT lung cancer screening is recommended for patients who are 39-76 years of age with a 20+ pack-year history of smoking and who are currently smoking or quit <=15 years ago. No coughing up blood  No unintentional weight loss of > 15 pounds in the last 6 months - pt is eligible for scanning yearly until 2029 > deferred to PCP         Each maintenance medication was reviewed in detail including emphasizing most importantly the difference between maintenance and prns and under what circumstances the prns are to be triggered using an action plan format where appropriate.  Total time for H and P, chart review, counseling, reviewing hfa  device(s) and generating customized AVS unique to this office visit / same day charting = 45 min pt new to me

## 2023-08-17 ENCOUNTER — Other Ambulatory Visit: Payer: Self-pay | Admitting: Family Medicine

## 2023-08-17 DIAGNOSIS — N644 Mastodynia: Secondary | ICD-10-CM

## 2023-08-22 NOTE — Telephone Encounter (Signed)
Patient has been scheduled for this with Dulce Sellar on 08/23/23 @ 2 pm.

## 2023-08-22 NOTE — Telephone Encounter (Signed)
See below regarding labs every 2 weeks.

## 2023-08-23 ENCOUNTER — Ambulatory Visit (INDEPENDENT_AMBULATORY_CARE_PROVIDER_SITE_OTHER): Payer: Medicare HMO | Admitting: Family

## 2023-08-23 ENCOUNTER — Encounter: Payer: Self-pay | Admitting: Family

## 2023-08-23 VITALS — BP 110/66 | HR 77 | Temp 97.7°F | Ht 67.0 in | Wt 272.0 lb

## 2023-08-23 DIAGNOSIS — R300 Dysuria: Secondary | ICD-10-CM | POA: Diagnosis not present

## 2023-08-23 DIAGNOSIS — K760 Fatty (change of) liver, not elsewhere classified: Secondary | ICD-10-CM

## 2023-08-23 LAB — POCT URINALYSIS DIPSTICK
Bilirubin, UA: NEGATIVE
Blood, UA: NEGATIVE
Glucose, UA: NEGATIVE
Ketones, UA: NEGATIVE
Leukocytes, UA: NEGATIVE
Nitrite, UA: NEGATIVE
Protein, UA: NEGATIVE
Spec Grav, UA: 1.025 (ref 1.010–1.025)
Urobilinogen, UA: 0.2 E.U./dL
pH, UA: 6 (ref 5.0–8.0)

## 2023-08-23 NOTE — Progress Notes (Signed)
Patient ID: Angie Velasquez, female    DOB: 19-Jul-1946, 77 y.o.   MRN: [id number]  Chief Complaint  Patient presents with   Urinary Tract Infection    Pt c/o of odor in urine, pain w/urination that started yesterday     HPI: Urinary sx:  reports having urinary frequency, dysuria (mild) and urgency since yesterday. She states the urine just flows out into her liner and she can't control it. She states she just has these sx usually associated with a UTI, but has not had one in over a year she believes. Denies nausea, low back pain, pelvic pain or any vaginal sx.  Assessment & Plan:  Dysuria- UA neg, advised on continued hydration, 8 cups daily, vaginal/rectal hygiene. Offered pyridium and/or myrbetriq, but pt prefers not to add another med if not needed. Advised to let us know if sx persist.  -     POCT urinalysis dipstick  Fatty liver-  -     Comprehensive metabolic panel  Subjective:    Outpatient Medications Prior to Visit  Medication Sig Dispense Refill   albuterol (VENTOLIN HFA) 108 (90 Base) MCG/ACT inhaler Inhale 2 puffs into the lungs every 6 (six) hours as needed for wheezing or shortness of breath. 18 g 2   allopurinol (ZYLOPRIM) 300 MG tablet TAKE 1 TABLET BY MOUTH 2 TIMES DAILY. 180 tablet 2   benzonatate (TESSALON PERLES) 100 MG capsule Take 1 capsule (100 mg total) by mouth 3 (three) times daily as needed. 60 capsule 0   budesonide-formoterol (SYMBICORT) 160-4.5 MCG/ACT inhaler Inhale 2 puffs into the lungs 2 (two) times daily. 1 each 1   CALCIUM-MAGNESIUM-ZINC PO Take by mouth.     clonazePAM (KLONOPIN) 1 MG tablet TAKE 0.5- 1 TABLET(0.5 MG- 1 mg) BY MOUTH AT BEDTIME AS NEEDED. Do not drive for at least 8 hours after taking 90 tablet 1   Clotrimazole POWD 1 application  by Does not apply route 2 (two) times daily. Apply after applying Clotrimazole cream. 25 g 1   clotrimazole-betamethasone (LOTRISONE) cream Apply 1 Application topically 2 (two) times daily.  Dry skin well first, then apply to rash under your stomach and groin areas. 15 g 1   CRANBERRY PO Take by mouth.     diazepam (VALIUM) 10 MG tablet Take 1 tablet (10 mg total) by mouth every 6 (six) hours as needed (as needed for muscle spasms). 30 tablet 0   diclofenac sodium (VOLTAREN) 1 % GEL Apply topically to affected area qid 100 g 2   Ginger, Zingiber officinalis, (GINGER ROOT) 550 MG CAPS Take by mouth.     ipratropium (ATROVENT) 0.03 % nasal spray Place 2 sprays into both nostrils every 12 (twelve) hours. 30 mL 12   omeprazole (PRILOSEC) 20 MG capsule Take 1 capsule (20 mg total) by mouth 2 (two) times daily before a meal. 180 capsule 3   ondansetron (ZOFRAN-ODT) 4 MG disintegrating tablet TAKE 1 TABLET BY MOUTH EVERY 8 HOURS AS NEEDED FOR NAUSEA AND VOMITING 20 tablet 3   OVER THE COUNTER MEDICATION Cherry extract bid     OVER THE COUNTER MEDICATION B-12 one daily     rosuvastatin (CRESTOR) 10 MG tablet TAKE 1 TABLET BY MOUTH EVERY DAY 90 tablet 1   sertraline (ZOLOFT) 50 MG tablet TAKE 1 TABLET BY MOUTH EVERY DAY 90 tablet 1   Sodium Bicarbonate POWD by Does not apply route.     traMADol (ULTRAM) 50 MG tablet Take 1 tablet (50 mg total)  by mouth every 6 (six) hours as needed for moderate pain or severe pain (Do not drive for 6 hours after taking). 60 tablet 0   triamcinolone (NASACORT) 55 MCG/ACT AERO nasal inhaler Place 1 spray into the nose daily. Start with 1 spray each side twice a day for 3 days, then reduce to daily. 1 each 2   triamcinolone cream (KENALOG) 0.1 % Apply 1 application topically 2 (two) times daily. For 7-10 days for recurring eczema issues 80 g 1   TURMERIC PO Take by mouth.     valACYclovir (VALTREX) 1000 MG tablet Take 2 pills twice a day for 1 day at first sign of cold sore 28 tablet 1   valsartan (DIOVAN) 80 MG tablet Take 1 tablet (80 mg total) by mouth daily. 30 tablet 11   No facility-administered medications prior to visit.   Past Medical History:   Diagnosis Date   Anxiety    years ago - no longer an issue   Arthritis    gout   Cataracts, bilateral    Complication of anesthesia    Fracture of pelvis (HCC)    GERD (gastroesophageal reflux disease)    Hx of chest pain    "anxiety"   Hyperlipidemia    INSOMNIA 10/14/2008   in past   Joint pain    Kidney problem    Low blood sugar    eats several smaller meals a day   Obesity    Palpitations    Stress related    Proteinuria    H/O   Tobacco abuse    quit smoking 08/2013   Past Surgical History:  Procedure Laterality Date   ABDOMINAL HYSTERECTOMY     APPENDECTOMY     with hysterectomy   childbirth     x4   CHOLECYSTECTOMY  2001   EYE SURGERY Bilateral 2015   cataract surgery with lens implant   NEPHRECTOMY  1992   complex cystic mass - nephrectomy right   ORIF ANKLE FRACTURE Right 07/02/2014   Procedure: OPEN REDUCTION INTERNAL FIXATION (ORIF) RIGHT ANKLE BIMALLOELAR FRACTURE;  Surgeon: Toni Arthurs, MD;  Location: [facility] OR;  Service: Orthopedics;  Laterality: Right;   OTHER SURGICAL HISTORY     hysterectomy, R ovary remains   SHOULDER SURGERY     bone spurs left   Allergies  Allergen Reactions   Epinephrine Palpitations   Codeine Nausea Only   Ozempic (0.25 Or 0.5 Mg-Dose) [Semaglutide(0.25 Or 0.5mg -Dos)]     I have had a terrible reaction with Ozempic. I was on vacation at [facility] and started vomiting and gas pains like I was in labor. On Sunday I started passing bright red blood with bowel movements. I drove back yesterday. I have not eaten anything solid for 2 days. I am not going to continue taking this.    Penicillins Nausea Only   Prednisone Cough    In 2024 took this again and tolerated well      Objective:    Physical Exam Vitals and nursing note reviewed.  Constitutional:      Appearance: Normal appearance. She is morbidly obese.  Cardiovascular:     Rate and Rhythm: Normal rate and regular rhythm.  Pulmonary:     Effort: Pulmonary effort  is normal.     Breath sounds: Normal breath sounds.  Musculoskeletal:        General: Normal range of motion.  Skin:    General: Skin is warm and dry.  Neurological:  Mental Status: She is alert.  Psychiatric:        Mood and Affect: Mood normal.        Behavior: Behavior normal.    BP 110/66   Pulse 77   Temp 97.7 F (36.5 C)   Ht 5\' 7"  (1.702 m)   Wt 272 lb (123.4 kg)   BMI 42.60 kg/m  Wt Readings from Last 3 Encounters:  08/23/23 272 lb (123.4 kg)  08/09/23 274 lb (124.3 kg)  07/05/23 269 lb 6 oz (122.2 kg)      Dulce Sellar, NP

## 2023-08-24 LAB — COMPREHENSIVE METABOLIC PANEL
ALT: 31 U/L (ref 0–35)
AST: 27 U/L (ref 0–37)
Albumin: 4.3 g/dL (ref 3.5–5.2)
Alkaline Phosphatase: 90 U/L (ref 39–117)
BUN: 15 mg/dL (ref 6–23)
CO2: 25 mEq/L (ref 19–32)
Calcium: 9 mg/dL (ref 8.4–10.5)
Chloride: 104 mEq/L (ref 96–112)
Creatinine, Ser: 1.22 mg/dL — ABNORMAL HIGH (ref 0.40–1.20)
GFR: 43.19 mL/min — ABNORMAL LOW (ref >60.00)
Glucose, Bld: 95 mg/dL (ref 70–99)
Potassium: 3.7 mEq/L (ref 3.5–5.1)
Sodium: 140 mEq/L (ref 135–145)
Total Bilirubin: 0.3 mg/dL (ref 0.2–1.2)
Total Protein: 6.6 g/dL (ref 6.0–8.3)

## 2023-09-17 ENCOUNTER — Other Ambulatory Visit: Payer: Self-pay | Admitting: Family

## 2023-09-17 DIAGNOSIS — B369 Superficial mycosis, unspecified: Secondary | ICD-10-CM

## 2023-09-18 DIAGNOSIS — Z961 Presence of intraocular lens: Secondary | ICD-10-CM | POA: Diagnosis not present

## 2023-09-18 DIAGNOSIS — H18593 Other hereditary corneal dystrophies, bilateral: Secondary | ICD-10-CM | POA: Diagnosis not present

## 2023-09-18 DIAGNOSIS — E119 Type 2 diabetes mellitus without complications: Secondary | ICD-10-CM | POA: Diagnosis not present

## 2023-09-18 LAB — HM DIABETES EYE EXAM

## 2023-09-19 ENCOUNTER — Other Ambulatory Visit: Payer: Medicare HMO

## 2023-09-24 ENCOUNTER — Encounter: Payer: Self-pay | Admitting: Family

## 2023-09-24 DIAGNOSIS — K649 Unspecified hemorrhoids: Secondary | ICD-10-CM

## 2023-09-25 MED ORDER — HYDROCORTISONE ACETATE 25 MG RE SUPP
25.0000 mg | Freq: Two times a day (BID) | RECTAL | 0 refills | Status: DC
Start: 2023-09-25 — End: 2024-05-15

## 2023-09-26 ENCOUNTER — Ambulatory Visit
Admission: RE | Admit: 2023-09-26 | Discharge: 2023-09-26 | Disposition: A | Discharge status: Home / Self Care | Payer: Medicare HMO | Source: Ambulatory Visit | Attending: Family Medicine | Admitting: Family Medicine

## 2023-09-26 ENCOUNTER — Ambulatory Visit: Admission: RE | Admit: 2023-09-26 | Payer: Medicare HMO | Source: Ambulatory Visit

## 2023-09-26 DIAGNOSIS — N644 Mastodynia: Secondary | ICD-10-CM

## 2023-09-27 ENCOUNTER — Telehealth: Payer: Self-pay | Admitting: Internal Medicine

## 2023-09-27 NOTE — Telephone Encounter (Signed)
Hello Dr.- PT canceled her FU appointment today stating she feels better and what you told her worked. She was very grateful. I said I'd let you know.

## 2023-10-03 NOTE — Telephone Encounter (Signed)
nfn

## 2023-10-04 ENCOUNTER — Ambulatory Visit: Payer: Medicare HMO | Admitting: Internal Medicine

## 2023-10-11 DIAGNOSIS — R7303 Prediabetes: Secondary | ICD-10-CM | POA: Diagnosis not present

## 2023-10-11 DIAGNOSIS — E785 Hyperlipidemia, unspecified: Secondary | ICD-10-CM | POA: Diagnosis not present

## 2023-10-11 DIAGNOSIS — K219 Gastro-esophageal reflux disease without esophagitis: Secondary | ICD-10-CM | POA: Diagnosis not present

## 2023-10-11 DIAGNOSIS — F325 Major depressive disorder, single episode, in full remission: Secondary | ICD-10-CM | POA: Diagnosis not present

## 2023-10-11 DIAGNOSIS — I2510 Atherosclerotic heart disease of native coronary artery without angina pectoris: Secondary | ICD-10-CM | POA: Diagnosis not present

## 2023-10-11 DIAGNOSIS — Z87891 Personal history of nicotine dependence: Secondary | ICD-10-CM | POA: Diagnosis not present

## 2023-10-11 DIAGNOSIS — Z8249 Family history of ischemic heart disease and other diseases of the circulatory system: Secondary | ICD-10-CM | POA: Diagnosis not present

## 2023-10-11 DIAGNOSIS — M109 Gout, unspecified: Secondary | ICD-10-CM | POA: Diagnosis not present

## 2023-10-11 DIAGNOSIS — I10 Essential (primary) hypertension: Secondary | ICD-10-CM | POA: Diagnosis not present

## 2023-10-11 DIAGNOSIS — F411 Generalized anxiety disorder: Secondary | ICD-10-CM | POA: Diagnosis not present

## 2023-10-11 DIAGNOSIS — J449 Chronic obstructive pulmonary disease, unspecified: Secondary | ICD-10-CM | POA: Diagnosis not present

## 2023-10-18 ENCOUNTER — Encounter: Payer: Self-pay | Admitting: Family Medicine

## 2023-11-02 ENCOUNTER — Encounter: Payer: Self-pay | Admitting: Family Medicine

## 2023-11-03 ENCOUNTER — Other Ambulatory Visit: Payer: Self-pay | Admitting: Family Medicine

## 2023-11-07 NOTE — Telephone Encounter (Signed)
Lab orders need placed please. Pt is scheduled for 11/16/23

## 2023-11-08 ENCOUNTER — Other Ambulatory Visit: Payer: Self-pay

## 2023-11-08 DIAGNOSIS — I10 Essential (primary) hypertension: Secondary | ICD-10-CM

## 2023-11-08 DIAGNOSIS — E785 Hyperlipidemia, unspecified: Secondary | ICD-10-CM

## 2023-11-08 DIAGNOSIS — M1A9XX0 Chronic gout, unspecified, without tophus (tophi): Secondary | ICD-10-CM

## 2023-11-08 DIAGNOSIS — R7303 Prediabetes: Secondary | ICD-10-CM

## 2023-11-08 DIAGNOSIS — Z131 Encounter for screening for diabetes mellitus: Secondary | ICD-10-CM

## 2023-11-08 DIAGNOSIS — N183 Chronic kidney disease, stage 3 unspecified: Secondary | ICD-10-CM

## 2023-11-08 NOTE — Telephone Encounter (Signed)
Labs have been ordered

## 2023-11-16 ENCOUNTER — Other Ambulatory Visit (INDEPENDENT_AMBULATORY_CARE_PROVIDER_SITE_OTHER): Payer: Medicare HMO

## 2023-11-16 DIAGNOSIS — Z131 Encounter for screening for diabetes mellitus: Secondary | ICD-10-CM

## 2023-11-16 DIAGNOSIS — R7303 Prediabetes: Secondary | ICD-10-CM

## 2023-11-16 DIAGNOSIS — E785 Hyperlipidemia, unspecified: Secondary | ICD-10-CM

## 2023-11-16 DIAGNOSIS — N183 Chronic kidney disease, stage 3 unspecified: Secondary | ICD-10-CM

## 2023-11-16 LAB — LIPID PANEL
Cholesterol: 134 mg/dL (ref 0–200)
HDL: 56.2 mg/dL (ref >39.00)
LDL Cholesterol: 34 mg/dL (ref 0–99)
NonHDL: 77.98
Total CHOL/HDL Ratio: 2
Triglycerides: 221 mg/dL — ABNORMAL HIGH (ref 0.0–149.0)
VLDL: 44.2 mg/dL — ABNORMAL HIGH (ref 0.0–40.0)

## 2023-11-16 LAB — COMPREHENSIVE METABOLIC PANEL
ALT: 21 U/L (ref 0–35)
AST: 21 U/L (ref 0–37)
Albumin: 4.1 g/dL (ref 3.5–5.2)
Alkaline Phosphatase: 84 U/L (ref 39–117)
BUN: 21 mg/dL (ref 6–23)
CO2: 26 meq/L (ref 19–32)
Calcium: 8.9 mg/dL (ref 8.4–10.5)
Chloride: 105 meq/L (ref 96–112)
Creatinine, Ser: 1.4 mg/dL — ABNORMAL HIGH (ref 0.40–1.20)
GFR: 36.55 mL/min — ABNORMAL LOW (ref >60.00)
Glucose, Bld: 113 mg/dL — ABNORMAL HIGH (ref 70–99)
Potassium: 4.1 meq/L (ref 3.5–5.1)
Sodium: 142 meq/L (ref 135–145)
Total Bilirubin: 0.4 mg/dL (ref 0.2–1.2)
Total Protein: 6.4 g/dL (ref 6.0–8.3)

## 2023-11-16 LAB — CBC WITH DIFFERENTIAL/PLATELET
Basophils Absolute: 0.1 10*3/uL (ref 0.0–0.1)
Basophils Relative: 0.7 % (ref 0.0–3.0)
Eosinophils Absolute: 0.2 10*3/uL (ref 0.0–0.7)
Eosinophils Relative: 2.6 % (ref 0.0–5.0)
HCT: 37.9 % (ref 36.0–46.0)
Hemoglobin: 12.6 g/dL (ref 12.0–15.0)
Lymphocytes Relative: 31.9 % (ref 12.0–46.0)
Lymphs Abs: 2.4 10*3/uL (ref 0.7–4.0)
MCHC: 33.1 g/dL (ref 30.0–36.0)
MCV: 92.1 fL (ref 78.0–100.0)
Monocytes Absolute: 0.6 10*3/uL (ref 0.1–1.0)
Monocytes Relative: 8.3 % (ref 3.0–12.0)
Neutro Abs: 4.3 10*3/uL (ref 1.4–7.7)
Neutrophils Relative %: 56.5 % (ref 43.0–77.0)
Platelets: 208 10*3/uL (ref 150.0–400.0)
RBC: 4.12 Mil/uL (ref 3.87–5.11)
RDW: 15.5 % (ref 11.5–15.5)
WBC: 7.5 10*3/uL (ref 4.0–10.5)

## 2023-11-16 LAB — HEMOGLOBIN A1C: Hgb A1c MFr Bld: 6.3 % (ref 4.6–6.5)

## 2023-11-16 LAB — TSH: TSH: 2.95 u[IU]/mL (ref 0.35–5.50)

## 2023-11-21 ENCOUNTER — Ambulatory Visit (INDEPENDENT_AMBULATORY_CARE_PROVIDER_SITE_OTHER): Payer: Medicare HMO | Admitting: Family Medicine

## 2023-11-21 ENCOUNTER — Encounter: Payer: Self-pay | Admitting: Family Medicine

## 2023-11-21 ENCOUNTER — Ambulatory Visit (HOSPITAL_COMMUNITY)
Admission: RE | Admit: 2023-11-21 | Discharge: 2023-11-21 | Disposition: A | Discharge status: Home / Self Care | Payer: Medicare HMO | Source: Ambulatory Visit | Attending: Family Medicine | Admitting: Family Medicine

## 2023-11-21 VITALS — BP 120/72 | HR 80 | Temp 98.0°F | Ht 67.0 in | Wt 274.0 lb

## 2023-11-21 DIAGNOSIS — L750 Bromhidrosis: Secondary | ICD-10-CM

## 2023-11-21 DIAGNOSIS — Z6841 Body Mass Index (BMI) 40.0 and over, adult: Secondary | ICD-10-CM

## 2023-11-21 DIAGNOSIS — R809 Proteinuria, unspecified: Secondary | ICD-10-CM

## 2023-11-21 DIAGNOSIS — K5730 Diverticulosis of large intestine without perforation or abscess without bleeding: Secondary | ICD-10-CM | POA: Diagnosis not present

## 2023-11-21 DIAGNOSIS — R109 Unspecified abdominal pain: Secondary | ICD-10-CM | POA: Diagnosis not present

## 2023-11-21 DIAGNOSIS — K760 Fatty (change of) liver, not elsewhere classified: Secondary | ICD-10-CM | POA: Diagnosis not present

## 2023-11-21 DIAGNOSIS — Z0000 Encounter for general adult medical examination without abnormal findings: Secondary | ICD-10-CM

## 2023-11-21 LAB — URINALYSIS, ROUTINE W REFLEX MICROSCOPIC
Bilirubin Urine: NEGATIVE
Hgb urine dipstick: NEGATIVE
Ketones, ur: NEGATIVE
Leukocytes,Ua: NEGATIVE
Nitrite: NEGATIVE
Specific Gravity, Urine: >=1.03 — AB (ref 1.000–1.030)
Total Protein, Urine: >=300 — AB
Urine Glucose: NEGATIVE
Urobilinogen, UA: 0.2 (ref 0.0–1.0)
pH: 6 (ref 5.0–8.0)

## 2023-11-21 LAB — MICROALBUMIN / CREATININE URINE RATIO
Creatinine,U: 178.6 mg/dL
Microalb Creat Ratio: 112.2 mg/g — ABNORMAL HIGH (ref 0.0–30.0)
Microalb, Ur: 200.3 mg/dL — ABNORMAL HIGH (ref 0.0–1.9)

## 2023-11-21 NOTE — Patient Instructions (Addendum)
Please stop by lab before you go If you have mychart- we will send your results within 3 business days of [name] receiving them.  If you do not have mychart- we will call you about results within 5 business days of [name] receiving them.  *please also note that you will see labs on mychart as soon as they post. I will later go in and write notes on them- will say "notes from Dr. Durene Cal"   Team please set up stat CT scan- rule out stones or obvious mass  Recommended follow up: Return in about 3 months (around 02/19/2024) for followup or sooner if needed.Schedule b4 you leave.

## 2023-11-21 NOTE — Progress Notes (Signed)
Phone [phone] In person visit   Subjective:   Angie Velasquez is a 77 y.o. year old very pleasant female patient who presents for/with See problem oriented charting  Past Medical History-  Patient Active Problem List   Diagnosis Date Noted   Depression 12/19/2016    Priority: High   S/p nephrectomy 03/12/2015    Priority: High   Anxiety state 12/09/2009    Priority: High   Fatty liver 06/12/2022    Priority: Medium    CKD (chronic kidney disease), stage III (HCC) 02/14/2019    Priority: Medium    Prediabetes 10/22/2017    Priority: Medium    Hyperglycemia 09/18/2017    Priority: Medium    Essential hypertension 01/04/2016    Priority: Medium    Gout 03/12/2015    Priority: Medium    Hyperlipidemia 03/12/2015    Priority: Medium    COPD (chronic obstructive pulmonary disease) (HCC) 03/12/2015    Priority: Medium    Arthritis     Priority: Medium    Former smoker     Priority: Medium    Vitamin D deficiency 05/28/2009    Priority: Medium    Hematuria, unspecified 05/26/2009    Priority: Medium    Hearing loss 01/20/2009    Priority: Medium    MICROALBUMINURIA 01/23/2008    Priority: Medium    Osteoarthritis of spine with radiculopathy, cervical region 02/12/2018    Priority: Low   Intertrigo 01/10/2018    Priority: Low   History of osteitis pubis 12/19/2017    Priority: Low   Eczema 06/29/2015    Priority: Low   Tinnitus 03/12/2015    Priority: Low   History of colonic polyps 03/12/2015    Priority: Low   Bimalleolar fracture 07/02/2014    Priority: Low   Arthropathy 05/26/2009    Priority: Low   Insomnia, unspecified 04/27/2009    Priority: Low   DDD (degenerative disc disease), lumbosacral 01/20/2009    Priority: Low   Sialoadenitis 01/20/2009    Priority: Low   GERD 01/23/2008    Priority: Low   Microscopic hematuria 01/23/2008    Priority: Low   UTI (urinary tract infection) 01/08/2008    Priority: Low   Obesity, morbid  (HCC) 11/21/2023   Upper airway cough syndrome 08/09/2023   Snoring 01/06/2020   Atypical chest pain 03/26/2017   Contact dermatitis and other eczema, due to unspecified cause 05/26/2009    Medications- reviewed and updated Current Outpatient Medications  Medication Sig Dispense Refill   albuterol (VENTOLIN HFA) 108 (90 Base) MCG/ACT inhaler Inhale 2 puffs into the lungs every 6 (six) hours as needed for wheezing or shortness of breath. 18 g 2   allopurinol (ZYLOPRIM) 300 MG tablet TAKE 1 TABLET BY MOUTH 2 TIMES DAILY. 180 tablet 2   budesonide-formoterol (SYMBICORT) 160-4.5 MCG/ACT inhaler Inhale 2 puffs into the lungs 2 (two) times daily. 1 each 1   CALCIUM-MAGNESIUM-ZINC PO Take by mouth.     Clotrimazole POWD 1 application  by Does not apply route 2 (two) times daily. Apply after applying Clotrimazole cream. 25 g 1   clotrimazole-betamethasone (LOTRISONE) cream APPLY 1 APPLICATION TOPICALLY 2 (TWO) TIMES DAILY. DRY SKIN WELL FIRST, THEN APPLY TO RASH UNDER YOUR STOMACH AND GROIN AREAS. 15 g 1   CRANBERRY PO Take by mouth.     diazepam (VALIUM) 10 MG tablet Take 1 tablet (10 mg total) by mouth every 6 (six) hours as needed (as needed for muscle spasms). 30 tablet 0  diclofenac sodium (VOLTAREN) 1 % GEL Apply topically to affected area qid 100 g 2   Ginger, Zingiber officinalis, (GINGER ROOT) 550 MG CAPS Take by mouth.     hydrocortisone (ANUSOL-HC) 25 MG suppository Place 1 suppository (25 mg total) rectally 2 (two) times daily. 12 suppository 0   ipratropium (ATROVENT) 0.03 % nasal spray Place 2 sprays into both nostrils every 12 (twelve) hours. 30 mL 12   omeprazole (PRILOSEC) 20 MG capsule Take 1 capsule (20 mg total) by mouth 2 (two) times daily before a meal. 180 capsule 3   OVER THE COUNTER MEDICATION Cherry extract bid     OVER THE COUNTER MEDICATION B-12 one daily     rosuvastatin (CRESTOR) 10 MG tablet TAKE 1 TABLET BY MOUTH EVERY DAY 90 tablet 1   sertraline (ZOLOFT) 50 MG  tablet TAKE 1 TABLET BY MOUTH EVERY DAY 90 tablet 1   Sodium Bicarbonate POWD by Does not apply route.     traMADol (ULTRAM) 50 MG tablet Take 1 tablet (50 mg total) by mouth every 6 (six) hours as needed for moderate pain or severe pain (Do not drive for 6 hours after taking). 60 tablet 0   triamcinolone (NASACORT) 55 MCG/ACT AERO nasal inhaler Place 1 spray into the nose daily. Start with 1 spray each side twice a day for 3 days, then reduce to daily. 1 each 2   triamcinolone cream (KENALOG) 0.1 % Apply 1 application topically 2 (two) times daily. For 7-10 days for recurring eczema issues 80 g 1   TURMERIC PO Take by mouth.     valACYclovir (VALTREX) 1000 MG tablet Take 2 pills twice a day for 1 day at first sign of cold sore 28 tablet 1   valsartan (DIOVAN) 80 MG tablet Take 1 tablet (80 mg total) by mouth daily. 30 tablet 11   benzonatate (TESSALON PERLES) 100 MG capsule Take 1 capsule (100 mg total) by mouth 3 (three) times daily as needed. (Patient not taking: Reported on 11/21/2023) 60 capsule 0   clonazePAM (KLONOPIN) 1 MG tablet TAKE 0.5- 1 TABLET(0.5 MG- 1 mg) BY MOUTH AT BEDTIME AS NEEDED. Do not drive for at least 8 hours after taking (Patient not taking: Reported on 11/21/2023) 90 tablet 1   ondansetron (ZOFRAN-ODT) 4 MG disintegrating tablet TAKE 1 TABLET BY MOUTH EVERY 8 HOURS AS NEEDED FOR NAUSEA AND VOMITING (Patient not taking: Reported on 11/21/2023) 20 tablet 3   No current facility-administered medications for this visit.     Objective:  BP 120/72   Pulse 80   Temp 98 F (36.7 C)   Ht 5\' 7"  (1.702 m)   Wt 274 lb (124.3 kg)   SpO2 97%   BMI 42.91 kg/m  Gen: NAD, resting comfortably Significant left flank pain, some pain in left low back with palpation. No midline pain    Assessment and Plan   # Left low back pain S:left low back and left flank pain for 4-6 weeks. Using walker. Noted change in urinary odor. Pain just stays . Unilateral functioning left kidney on  that side- no stones in 2023.  -uses ondansetron for nausea up to twice a day -she is very concerned that with creatinine worsening that she is having kidney failure- tried to reassure her. She has had fatigue as well.  -states doesn't feel well enough to go [place] to see son get married A/P: left low back pain and urinary odor- check urinalysis  -reports microscopic hematuria in past-  want to make sure not worsening - on exam significant flank pain- will get CT without contrast  Recommended follow up: return to see [name] with new or worsening symptoms Future Appointments  Date Time Provider Department Center  12/11/2023  9:30 AM [address] [facility] None   Lab/Order associations:   ICD-10-CM   1. Left flank pain  R10.9 Urine Culture    Urinalysis, Routine w reflex microscopic    CT ABDOMEN PELVIS WO CONTRAST    2. Urinary body odor  L75.0 Urine Culture    Urinalysis, Routine w reflex microscopic    3. Proteinuria, unspecified type  R80.9 Microalbumin / creatinine urine ratio    4. Abdominal pain, unspecified abdominal location  R10.9 CT ABDOMEN PELVIS WO CONTRAST   No orders of the defined types were placed in this encounter.  Return precautions advised.  Tana Conch, MD

## 2023-11-21 NOTE — Progress Notes (Signed)
Phone [phone]   Subjective:  Patient presents today for their annual physical. Chief complaint-noted.   See problem oriented charting- ROS- full  review of systems was completed and negative except for: fatigue, sweaty in am, ear pain, low back pain, urine smell, [place], weight gain, cough but better, constipation  The following were reviewed and entered/updated in epic: Past Medical History:  Diagnosis Date   Anxiety    years ago - no longer an issue   Arthritis    gout   Cataracts, bilateral    Complication of anesthesia    Fracture of pelvis (HCC)    GERD (gastroesophageal reflux disease)    Hx of chest pain    "anxiety"   Hyperlipidemia    INSOMNIA 10/14/2008   in past   Joint pain    Kidney problem    Low blood sugar    eats several smaller meals a day   Obesity    Palpitations    Stress related    Proteinuria    H/O   Tobacco abuse    quit smoking 08/2013   Patient Active Problem List   Diagnosis Date Noted   Depression 12/19/2016    Priority: High   S/p nephrectomy 03/12/2015    Priority: High   Anxiety state 12/09/2009    Priority: High   Fatty liver 06/12/2022    Priority: Medium    CKD (chronic kidney disease), stage III (HCC) 02/14/2019    Priority: Medium    Prediabetes 10/22/2017    Priority: Medium    Hyperglycemia 09/18/2017    Priority: Medium    Essential hypertension 01/04/2016    Priority: Medium    Gout 03/12/2015    Priority: Medium    Hyperlipidemia 03/12/2015    Priority: Medium    COPD (chronic obstructive pulmonary disease) (HCC) 03/12/2015    Priority: Medium    Arthritis     Priority: Medium    Former smoker     Priority: Medium    Vitamin D deficiency 05/28/2009    Priority: Medium    Hematuria, unspecified 05/26/2009    Priority: Medium    Hearing loss 01/20/2009    Priority: Medium    MICROALBUMINURIA 01/23/2008    Priority: Medium    Osteoarthritis of spine with radiculopathy, cervical region 02/12/2018     Priority: Low   Intertrigo 01/10/2018    Priority: Low   History of osteitis pubis 12/19/2017    Priority: Low   Eczema 06/29/2015    Priority: Low   Tinnitus 03/12/2015    Priority: Low   History of colonic polyps 03/12/2015    Priority: Low   Bimalleolar fracture 07/02/2014    Priority: Low   Arthropathy 05/26/2009    Priority: Low   Insomnia, unspecified 04/27/2009    Priority: Low   DDD (degenerative disc disease), lumbosacral 01/20/2009    Priority: Low   Sialoadenitis 01/20/2009    Priority: Low   GERD 01/23/2008    Priority: Low   Microscopic hematuria 01/23/2008    Priority: Low   UTI (urinary tract infection) 01/08/2008    Priority: Low   Obesity, morbid (HCC) 11/21/2023   Upper airway cough syndrome 08/09/2023   Snoring 01/06/2020   Atypical chest pain 03/26/2017   Contact dermatitis and other eczema, due to unspecified cause 05/26/2009   Past Surgical History:  Procedure Laterality Date   ABDOMINAL HYSTERECTOMY     APPENDECTOMY     with hysterectomy   childbirth  x4   CHOLECYSTECTOMY  2001   EYE SURGERY Bilateral 2015   cataract surgery with lens implant   NEPHRECTOMY  1992   complex cystic mass - nephrectomy right   ORIF ANKLE FRACTURE Right 07/02/2014   Procedure: OPEN REDUCTION INTERNAL FIXATION (ORIF) RIGHT ANKLE BIMALLOELAR FRACTURE;  Surgeon: Toni Arthurs, MD;  Location: [facility] OR;  Service: Orthopedics;  Laterality: Right;   OTHER SURGICAL HISTORY     hysterectomy, R ovary remains   SHOULDER SURGERY     bone spurs left    Family History  Problem Relation Age of Onset   Ovarian cancer Mother        Dr. Darnelle Catalan    Arthritis Mother    Obesity Mother    Stroke Father    Alzheimer's disease Father        early, states also parkinsons. died 25   Hypertension Father    Obesity Father    Colon cancer Paternal Grandmother    Liver disease Daughter        On hospice age 27   Esophageal cancer Neg Hx    Stomach cancer Neg Hx    Rectal cancer  Neg Hx     Medications- reviewed and updated Current Outpatient Medications  Medication Sig Dispense Refill   albuterol (VENTOLIN HFA) 108 (90 Base) MCG/ACT inhaler Inhale 2 puffs into the lungs every 6 (six) hours as needed for wheezing or shortness of breath. 18 g 2   allopurinol (ZYLOPRIM) 300 MG tablet TAKE 1 TABLET BY MOUTH 2 TIMES DAILY. 180 tablet 2   budesonide-formoterol (SYMBICORT) 160-4.5 MCG/ACT inhaler Inhale 2 puffs into the lungs 2 (two) times daily. 1 each 1   CALCIUM-MAGNESIUM-ZINC PO Take by mouth.     Clotrimazole POWD 1 application  by Does not apply route 2 (two) times daily. Apply after applying Clotrimazole cream. 25 g 1   clotrimazole-betamethasone (LOTRISONE) cream APPLY 1 APPLICATION TOPICALLY 2 (TWO) TIMES DAILY. DRY SKIN WELL FIRST, THEN APPLY TO RASH UNDER YOUR STOMACH AND GROIN AREAS. 15 g 1   CRANBERRY PO Take by mouth.     diazepam (VALIUM) 10 MG tablet Take 1 tablet (10 mg total) by mouth every 6 (six) hours as needed (as needed for muscle spasms). 30 tablet 0   diclofenac sodium (VOLTAREN) 1 % GEL Apply topically to affected area qid 100 g 2   Ginger, Zingiber officinalis, (GINGER ROOT) 550 MG CAPS Take by mouth.     hydrocortisone (ANUSOL-HC) 25 MG suppository Place 1 suppository (25 mg total) rectally 2 (two) times daily. 12 suppository 0   ipratropium (ATROVENT) 0.03 % nasal spray Place 2 sprays into both nostrils every 12 (twelve) hours. 30 mL 12   omeprazole (PRILOSEC) 20 MG capsule Take 1 capsule (20 mg total) by mouth 2 (two) times daily before a meal. 180 capsule 3   OVER THE COUNTER MEDICATION Cherry extract bid     OVER THE COUNTER MEDICATION B-12 one daily     rosuvastatin (CRESTOR) 10 MG tablet TAKE 1 TABLET BY MOUTH EVERY DAY 90 tablet 1   sertraline (ZOLOFT) 50 MG tablet TAKE 1 TABLET BY MOUTH EVERY DAY 90 tablet 1   Sodium Bicarbonate POWD by Does not apply route.     traMADol (ULTRAM) 50 MG tablet Take 1 tablet (50 mg total) by mouth every 6  (six) hours as needed for moderate pain or severe pain (Do not drive for 6 hours after taking). 60 tablet 0   triamcinolone (NASACORT) 55  MCG/ACT AERO nasal inhaler Place 1 spray into the nose daily. Start with 1 spray each side twice a day for 3 days, then reduce to daily. 1 each 2   triamcinolone cream (KENALOG) 0.1 % Apply 1 application topically 2 (two) times daily. For 7-10 days for recurring eczema issues 80 g 1   TURMERIC PO Take by mouth.     valACYclovir (VALTREX) 1000 MG tablet Take 2 pills twice a day for 1 day at first sign of cold sore 28 tablet 1   valsartan (DIOVAN) 80 MG tablet Take 1 tablet (80 mg total) by mouth daily. 30 tablet 11   benzonatate (TESSALON PERLES) 100 MG capsule Take 1 capsule (100 mg total) by mouth 3 (three) times daily as needed. (Patient not taking: Reported on 11/21/2023) 60 capsule 0   clonazePAM (KLONOPIN) 1 MG tablet TAKE 0.5- 1 TABLET(0.5 MG- 1 mg) BY MOUTH AT BEDTIME AS NEEDED. Do not drive for at least 8 hours after taking (Patient not taking: Reported on 11/21/2023) 90 tablet 1   ondansetron (ZOFRAN-ODT) 4 MG disintegrating tablet TAKE 1 TABLET BY MOUTH EVERY 8 HOURS AS NEEDED FOR NAUSEA AND VOMITING (Patient not taking: Reported on 11/21/2023) 20 tablet 3   No current facility-administered medications for this visit.    Allergies-reviewed and updated Allergies  Allergen Reactions   Epinephrine Palpitations   Codeine Nausea Only   Iodinated Contrast Media     Reports anaphylaxis   Ozempic (0.25 Or 0.5 Mg-Dose) [Semaglutide(0.25 Or 0.5mg -Dos)]     I have had a terrible reaction with Ozempic. I was on vacation at [facility] and started vomiting and gas pains like I was in labor. On Sunday I started passing bright red blood with bowel movements. I drove back yesterday. I have not eaten anything solid for 2 days. I am not going to continue taking this.    Penicillins Nausea Only   Prednisone Cough    In 2024 took this again and tolerated well     Social History   Social History Narrative   Divorced. Found love of her life recently in 2016 (Al). 3 sons. 5 grandkids.    Daughter (had at age 63 and was adopted). To care for Al [place] her granddaughter when daughter passes- daughter on hospice.       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 Oropharynx normal Neck: no thyromegaly CV: RRR no murmurs rubs or gallops Lungs: CTAB no crackles, wheeze, rhonchi Abdomen: soft/nontender/nondistended/normal bowel sounds. No rebound or guarding.  Ext: no edema Pain over left lateral low back but significant pain in left flank Skin: warm, dry Neuro: grossly normal, moves all extremities, PERRLA   Assessment and Plan   77 y.o. female presenting for annual physical.  Health Maintenance counseling: 1. Anticipatory guidance: Patient counseled regarding regular dental exams -q6 months, eye exams - yearly,  avoiding smoking and second hand smoke , limiting alcohol to 1 beverage per day- very rare , no illicit drugs .   2. Risk  factor reduction:  Advised patient of need for regular exercise and diet rich and fruits and vegetables to reduce risk of heart attack and stroke.  Exercise- trying to be more active in home- back bothering her has been concern.  Diet/weight management-up 7 lbs from June despite cutting down on sugars. Morbid obesity noted- with BMI over 40- Encouraged need for healthy eating, regular exercise, weight loss.  Wt Readings from Last 3 Encounters:  11/21/23 274 lb (124.3 kg)  08/23/23 272 lb (123.4 kg)  08/09/23 274 lb (124.3  kg)  3. Immunizations/screenings/ancillary studies- opts out flu and shingrix Immunization History  Administered Date(s) Administered   Influenza, High Dose Seasonal PF 09/29/2016, 09/05/2017, 08/19/2018   Influenza,inj,Quad PF,6+ Mos 08/24/2015   Influenza,inj,quad, With Preservative 08/27/2017   Moderna Sars-Covid-2 Vaccination 02/14/2020, 03/07/2020   Pneumococcal Conjugate-13 03/12/2015   Pneumococcal Polysaccharide-23 01/09/2017   Td 04/24/2016   Zoster, Live 03/12/2015   4. Cervical cancer screening- past age based screening recommendations . No blood or discharge 5. Breast cancer screening-  breast exam - prefers self exam- and mammogram 09/26/23 6. Colon cancer screening - 10/17/22 with adenoma with Dr. Marina Goodell and was told released 7. Skin cancer screening- good report with dermatology within last year. advised regular sunscreen use. Denies worrisome, changing, or new skin lesions.  8. Birth control/STD check- not active 9. Osteoporosis screening at 16- 2018- she declines repeat 10. Smoking associated screening - former smoker- quit 12 years ago- check ua  Status of chronic or acute concerns   #Left low back pain- see separate note  # Hyperglycemia/insulin resistance/prediabetes S:  Medication: none Lab Results  Component Value Date   HGBA1C 6.3 11/16/2023   HGBA1C 6.1 04/23/2023   HGBA1C 6.3 08/22/2022   A/P: has tried to cut down on sugars- recheck next visit  # Solitary 5 mm nodule on 05/14/2023 and right upper lobe-plan for 6 to 9-month follow-up as former smoker- we have opted for June 2024   #Hyperlipidemia with calcium on CT coronary artery calcifications S: Compliant with simvastatin 20 mg  Lab Results  Component Value Date   CHOL 134 11/16/2023   HDL 56.20 11/16/2023   LDLCALC 34 11/16/2023   LDLDIRECT 104.0 04/23/2023   TRIG 221.0 (H) 11/16/2023   CHOLHDL 2 11/16/2023  A/P: LDL at goal- continue current medications- weight loss may help triglyceride(s)     #Unilateral kidney/microalbuminuria S: now on valsartan instead of lisinopril A/P: update microalbumin testing today. Recent GFR slightly lower but still over 30   #Gout S: no issues on allopurinol 300 mg twice daily Lab Results  Component Value Date   LABURIC 3.7 04/23/2023  A/P:uric acid levels at goal- continue current medications- but considered reducing - but could trigger flare    #Vitamin D deficiency- missed on labs this year- check next year    #Depression-full remission/anxiety S: meds: sertraline 50 mg    11/21/2023    9:54 AM 08/23/2023    2:06 PM 06/21/2023    1:12 PM  Depression screen PHQ 2/9  Decreased Interest 0 0 0  Down, Depressed, Hopeless 0 0 0  PHQ - 2 Score 0 0 0  Altered sleeping 0 0 0  Tired, decreased energy 3 0 0  Change in appetite 0 0 0  Feeling bad or failure about yourself  0 0 0  Trouble concentrating 0 0 0  Moving slowly or fidgety/restless 0 0 0  Suicidal thoughts 0 0 0  PHQ-9 Score 3 0 0  Difficult  doing work/chores Not difficult at all Not difficult at all Not difficult at all  A/P: reports good contorl/full remisison   #fatty liver- noted on abdominal ultrasound July 2023- LFTs have been ok- knows importance of weight loss - stable on labs Lab Results  Component Value Date   ALT 21 11/16/2023   AST 21 11/16/2023   ALKPHOS 84 11/16/2023   BILITOT 0.4 11/16/2023   Recommended follow up: Return in about 3 months (around 02/19/2024) for followup or sooner if needed.Schedule b4 you leave. Future Appointments  Date Time Provider Department Center  12/11/2023  9:30 AM [facility] [facility] None    Lab/Order associations:already had labs fasting   ICD-10-CM   1. Preventative health care  Z00.00     2. Left flank pain  R10.9 Urine Culture    Urinalysis, Routine w reflex microscopic    CT ABDOMEN PELVIS WO CONTRAST    3. Urinary body odor  L75.0 Urine Culture    Urinalysis, Routine w reflex microscopic    4. Proteinuria,  unspecified type  R80.9 Microalbumin / creatinine urine ratio    5. Abdominal pain, unspecified abdominal location  R10.9 CT ABDOMEN PELVIS WO CONTRAST    6. Obesity, morbid (HCC) Chronic E66.01       No orders of the defined types were placed in this encounter.   Return precautions advised.  Tana Conch, MD

## 2023-11-22 ENCOUNTER — Institutional Professional Consult (permissible substitution) (INDEPENDENT_AMBULATORY_CARE_PROVIDER_SITE_OTHER): Payer: Medicare HMO

## 2023-11-22 LAB — URINE CULTURE
MICRO NUMBER:: 15866471
Result:: NO GROWTH
SPECIMEN QUALITY:: ADEQUATE

## 2023-11-24 ENCOUNTER — Encounter: Payer: Self-pay | Admitting: Family Medicine

## 2023-11-26 ENCOUNTER — Other Ambulatory Visit: Payer: Self-pay

## 2023-11-26 ENCOUNTER — Other Ambulatory Visit: Payer: Self-pay | Admitting: Family Medicine

## 2023-11-26 MED ORDER — ONDANSETRON 4 MG PO TBDP: ORAL_TABLET | ORAL | 3 refills | Status: DC

## 2023-11-29 ENCOUNTER — Institutional Professional Consult (permissible substitution) (INDEPENDENT_AMBULATORY_CARE_PROVIDER_SITE_OTHER): Payer: Medicare HMO

## 2023-12-07 NOTE — Telephone Encounter (Signed)
**Note De-identified  [place] Obfuscation** Please advise 

## 2023-12-10 ENCOUNTER — Other Ambulatory Visit: Payer: Self-pay | Admitting: Family Medicine

## 2023-12-10 ENCOUNTER — Telehealth (INDEPENDENT_AMBULATORY_CARE_PROVIDER_SITE_OTHER): Payer: Self-pay | Admitting: Otolaryngology

## 2023-12-10 ENCOUNTER — Telehealth: Payer: PPO | Admitting: Physician Assistant

## 2023-12-10 DIAGNOSIS — J208 Acute bronchitis due to other specified organisms: Secondary | ICD-10-CM

## 2023-12-10 DIAGNOSIS — J441 Chronic obstructive pulmonary disease with (acute) exacerbation: Secondary | ICD-10-CM

## 2023-12-10 DIAGNOSIS — B9689 Other specified bacterial agents as the cause of diseases classified elsewhere: Secondary | ICD-10-CM

## 2023-12-10 MED ORDER — AZITHROMYCIN 250 MG PO TABS: ORAL_TABLET | ORAL | 0 refills | Status: AC

## 2023-12-10 NOTE — Progress Notes (Signed)
 E-Visit for Cough   We are sorry that you are not feeling well.  Here is how we plan to help!  Based on your presentation I believe you most likely have A cough due to bacteria.  When patients have a fever and a productive cough with a change in color or increased sputum production, we are concerned about bacterial bronchitis.  If left untreated it can progress to pneumonia.  If your symptoms do not improve with your treatment plan it is important that you contact your provider.   I have prescribed Azithromyin 250 mg: two tablets now and then one tablet daily for 4 additonal days    In addition you may continue to use Tessalon  perles. Can add Mucinex (PLAIN).  Continue to use Albuterol  inhaler as needed for wheezing and shortness of breath.  From your responses in the eVisit questionnaire you describe inflammation in the upper respiratory tract which is causing a significant cough.  This is commonly called Bronchitis and has four common causes:   Allergies Viral Infections Acid Reflux Bacterial Infection Allergies, viruses and acid reflux are treated by controlling symptoms or eliminating the cause. An example might be a cough caused by taking certain blood pressure medications. You stop the cough by changing the medication. Another example might be a cough caused by acid reflux. Controlling the reflux helps control the cough.  USE OF BRONCHODILATOR (RESCUE) INHALERS: There is a risk from using your bronchodilator too frequently.  The risk is that over-reliance on a medication which only relaxes the muscles surrounding the breathing tubes can reduce the effectiveness of medications prescribed to reduce swelling and congestion of the tubes themselves.  Although you feel brief relief from the bronchodilator inhaler, your asthma may actually be worsening with the tubes becoming more swollen and filled with mucus.  This can delay other crucial treatments, such as oral steroid medications. If you  need to use a bronchodilator inhaler daily, several times per day, you should discuss this with your provider.  There are probably better treatments that could be used to keep your asthma under control.     HOME CARE Only take medications as instructed by your medical team. Complete the entire course of an antibiotic. Drink plenty of fluids and get plenty of rest. Avoid close contacts especially the very young and the elderly Cover your mouth if you cough or cough into your sleeve. Always remember to wash your hands A steam or ultrasonic humidifier can help congestion.   GET HELP RIGHT AWAY IF: You develop worsening fever. You become short of breath You cough up blood. Your symptoms persist after you have completed your treatment plan MAKE SURE YOU  Understand these instructions. Will watch your condition. Will get help right away if you are not doing well or get worse.    Thank you for choosing an e-visit.  Your e-visit answers were reviewed by a board certified advanced clinical practitioner to complete your personal care plan. Depending upon the condition, your plan could have included both over the counter or prescription medications.  Please review your pharmacy choice. Make sure the pharmacy is open so you can pick up prescription now. If there is a problem, you may contact your provider through [employer] and have the prescription routed to another pharmacy.  Your safety is important to us . If you have drug allergies check your prescription carefully.   For the next 24 hours you can use MyChart to ask questions about today's visit, request a  non-urgent call back, or ask for a work or school excuse. You will get an email in the next two days asking about your experience. I hope that your e-visit has been valuable and will speed your recovery.   I have spent 5 minutes in review of e-visit questionnaire, review and updating patient chart, medical decision making and  response to patient.   Delon CHRISTELLA Dickinson, PA-C

## 2023-12-10 NOTE — Telephone Encounter (Signed)
Confirmed location with patient

## 2023-12-11 ENCOUNTER — Encounter (INDEPENDENT_AMBULATORY_CARE_PROVIDER_SITE_OTHER): Payer: Self-pay

## 2023-12-11 ENCOUNTER — Ambulatory Visit (INDEPENDENT_AMBULATORY_CARE_PROVIDER_SITE_OTHER): Payer: PPO | Admitting: Otolaryngology

## 2023-12-11 VITALS — BP 138/77 | HR 89 | Resp 19 | Ht 67.0 in | Wt 274.0 lb

## 2023-12-11 DIAGNOSIS — R053 Chronic cough: Secondary | ICD-10-CM | POA: Diagnosis not present

## 2023-12-11 DIAGNOSIS — H608X2 Other otitis externa, left ear: Secondary | ICD-10-CM | POA: Diagnosis not present

## 2023-12-11 DIAGNOSIS — R0981 Nasal congestion: Secondary | ICD-10-CM

## 2023-12-11 DIAGNOSIS — J328 Other chronic sinusitis: Secondary | ICD-10-CM | POA: Diagnosis not present

## 2023-12-11 DIAGNOSIS — J343 Hypertrophy of nasal turbinates: Secondary | ICD-10-CM | POA: Diagnosis not present

## 2023-12-11 DIAGNOSIS — H9202 Otalgia, left ear: Secondary | ICD-10-CM

## 2023-12-11 DIAGNOSIS — R0982 Postnasal drip: Secondary | ICD-10-CM | POA: Diagnosis not present

## 2023-12-11 MED ORDER — CIPROFLOXACIN-DEXAMETHASONE 0.3-0.1 % OT SUSP: 4.0000 [drp] | Freq: Two times a day (BID) | OTIC | 1 refills | Status: AC

## 2023-12-11 NOTE — Patient Instructions (Addendum)
 iUse ciprodex drops 4 drops twice daily for 2 weeks

## 2023-12-11 NOTE — Progress Notes (Signed)
 Dear Dr. Katrinka, Here is my assessment for our mutual patient, Angie Velasquez. Thank you for allowing me the opportunity to care for your patient. Please do not hesitate to contact me should you have any other questions. Sincerely, Dr. Eldora Blanch  Otolaryngology Clinic Note Referring provider: Dr. Katrinka HPI:  Angie Velasquez is a 78 y.o. female kindly referred by Dr. Katrinka for evaluation of left ear discomfort  Initial visit (12/2023): Patient reports: chronic left ear discomfort and fullness, ongoing since last year since she had a cough and sinus pressure issues. No other antecedent event, random. Lasts about an hour, sometimes has to take tylenol . Worse when she coughs. Sweet oil and tylenol  helps. She also reports that she is having some ear itching on the left, which can get really intense. She does not know if the itching is related to the pain. She has tried multiple courses of abx/steroids and it has not helped much She is using HA in both ears - pain not connected to the hearing aid use. Has had tinnitus bilateral for a while, non-pulsatile. Hearing is also worse in left ear but this is chronic and no recent change.  She has seen Dr. Darlean for chronic cough, which is much improved after d/c lisinopril . Still some issue, but much improved. Dry cough, intermittent. Not productivity, no fevers. Denies GERD Sx. Never had these problems until she used the CPAP.  Was having some URI sx including feeling tired yesterday, and is on abx for Bronchitis. This is more of a productive cough but no sig sinonasal sx.  Patient denies: fullness, vertigo, drainage Patient additionally denies: eustachian tube symptoms such as popping, crackling, sensitive to pressure changes Patient also denies barotrauma, vestibular suppressant use, ototoxic medication use Prior ear surgery: no Ears generally not a problem.  Patient otherwise denies: - dysphagia, odynophagia, aspiration episodes or PNA, need  for Heimlich, unintentional weight loss - changes in voice, shortness of breath, hemoptysis, significant alcohol history - ear pain, neck masses  No significant sinus problems currently; does get bronchitis; denies facial pressure but does report some congestion, no discolored drainage or hyposmia; some PND She does not think this is TMJ, for which she was diagnosed by Dr. Carlie.   -------------------------------------------------------------------------- H&N Surgery: no Personal or FHx of bleeding dz or anesthesia difficulty: no  AP/AC: no  Tobacco: quit 15 years ago; smoked 1 PPD.  PMHx: CKD, Nausea, COPD, Pre-diabetes, Gout, Unilateral Kidney, HLD  Independent Review of Additional Tests or Records:  Primary Care Notes (Dr. Katrinka and Lucie Buttner) - 2/26//2024 - noted left ear pain and itching, worsening, prior Rx with azithromycin , nasal spray; Dx ith ear infxn, trialed omnicef , Ref to ENT -- Persistent so saw Dr. Katrinka (04/27/2023): feels more sinus related since improvement there makes it seem like ear feels better; had COPD Exacerbation 05/2023 and was Rx doxy Dr. Katrinka 05/18/2023: Noted sinus pressure/allergies/feeling stopped up; continued congestion and improvement with flonase/claritin; need SOB and tachy so seen in ED, no PE; Rx: finish doxy, symbicort ; hold lisinopril ; Dr. Katrinka (06/04/2023): Ongoing cough/congestion, ref to pulm, now with ear pain June 30, no iprovement despite doxy/pred. Still with pressure in sinuses; Dx: AOM and sinusitis; Rx: Augmentin ; stil no improvement, Rx: z-pak Stephanie Hudnell (07/05/2023): Sinus pain (frontal pressure), Rx: nasacort  Abx: Azithromycin  01/24/2023 for bronchitis; Doxy 01/2023; Doxy 05/2023 - for COPD Exacerbation; Augmentin  06/2023; Z-pak 06/2023  Dr. Carlie (ENT) - 03/20/2023 - seen for earache, Rx with z-pak and doxy for presumed sinusitis, continued ache;  Dx with TMJ, rec ibuprofen, heating pad, and soft diet Dr. Darlean (Pulm) - 9/52024:  cough/congesiton 3 months; ; d/c'd ACE in 08/09/2023, max Rx GERD; symbicort ; Rx UACS; f/u in 4-6 weeks; now with significant improvement EGD (05/03/2023):   Labs 11/2023:  CMP 11/16/2023 - Cr 1.40, BUN 21 CBC 11/16/2023: WBC 7.5, Hgb 12.6  PMH/Meds/All/SocHx/FamHx/ROS:   Past Medical History:  Diagnosis Date   Anxiety    years ago - no longer an issue   Arthritis    gout   Cataracts, bilateral    Complication of anesthesia    Fracture of pelvis (HCC)    GERD (gastroesophageal reflux disease)    Hx of chest pain    anxiety   Hyperlipidemia    INSOMNIA 10/14/2008   in past   Joint pain    Kidney problem    Low blood sugar    eats several smaller meals a day   Obesity    Palpitations    Stress related    Proteinuria    H/O   Tobacco abuse    quit smoking 08/2013     Past Surgical History:  Procedure Laterality Date   ABDOMINAL HYSTERECTOMY     APPENDECTOMY     with hysterectomy   childbirth     x4   CHOLECYSTECTOMY  2001   EYE SURGERY Bilateral 2015   cataract surgery with lens implant   NEPHRECTOMY  1992   complex cystic mass - nephrectomy right   ORIF ANKLE FRACTURE Right 07/02/2014   Procedure: OPEN REDUCTION INTERNAL FIXATION (ORIF) RIGHT ANKLE BIMALLOELAR FRACTURE;  Surgeon: Norleen Armor, MD;  Location: [facility] OR;  Service: Orthopedics;  Laterality: Right;   OTHER SURGICAL HISTORY     hysterectomy, R ovary remains   SHOULDER SURGERY     bone spurs left    Family History  Problem Relation Age of Onset   Ovarian cancer Mother        Dr. Layla    Arthritis Mother    Obesity Mother    Stroke Father    Alzheimer's disease Father        early, states also parkinsons. died 79   Hypertension Father    Obesity Father    Colon cancer Paternal Grandmother    Liver disease Daughter        On hospice age 77   Esophageal cancer Neg Hx    Stomach cancer Neg Hx    Rectal cancer Neg Hx      Social Connections: Moderately Integrated (06/21/2023)   Social  Connection and Isolation Panel [NHANES]    Frequency of Communication with Friends and Family: More than three times a week    Frequency of Social Gatherings with Friends and Family: Three times a week    Attends Religious Services: More than 4 times per year    Active Member of Clubs or Organizations: Yes    Attends Engineer, Structural: More than 4 times per year    Marital Status: Divorced      Current Outpatient Medications:    albuterol  (VENTOLIN  HFA) 108 (90 Base) MCG/ACT inhaler, Inhale 2 puffs into the lungs every 6 (six) hours as needed for wheezing or shortness of breath., Disp: 18 g, Rfl: 2   allopurinol  (ZYLOPRIM ) 300 MG tablet, TAKE 1 TABLET BY MOUTH 2 TIMES DAILY., Disp: 180 tablet, Rfl: 2   benzonatate  (TESSALON ) 100 MG capsule, TAKE 1 CAPSULE BY MOUTH THREE TIMES A DAY AS NEEDED, Disp: 60 capsule, Rfl:  0   budesonide -formoterol  (SYMBICORT ) 160-4.5 MCG/ACT inhaler, Inhale 2 puffs into the lungs 2 (two) times daily., Disp: 1 each, Rfl: 1   CALCIUM -MAGNESIUM-ZINC  PO, Take by mouth., Disp: , Rfl:    ciprofloxacin -dexamethasone  (CIPRODEX ) OTIC suspension, Place 4 drops into the left ear 2 (two) times daily for 14 days., Disp: 7.5 mL, Rfl: 1   clonazePAM  (KLONOPIN ) 1 MG tablet, TAKE 0.5- 1 TABLET(0.5 MG- 1 mg) BY MOUTH AT BEDTIME AS NEEDED. Do not drive for at least 8 hours after taking, Disp: 90 tablet, Rfl: 1   Clotrimazole  POWD, 1 application  by Does not apply route 2 (two) times daily. Apply after applying Clotrimazole  cream., Disp: 25 g, Rfl: 1   clotrimazole -betamethasone  (LOTRISONE ) cream, APPLY 1 APPLICATION TOPICALLY 2 (TWO) TIMES DAILY. DRY SKIN WELL FIRST, THEN APPLY TO RASH UNDER YOUR STOMACH AND GROIN AREAS., Disp: 15 g, Rfl: 1   CRANBERRY PO, Take by mouth., Disp: , Rfl:    diazepam  (VALIUM ) 10 MG tablet, Take 1 tablet (10 mg total) by mouth every 6 (six) hours as needed (as needed for muscle spasms)., Disp: 30 tablet, Rfl: 0   diclofenac  sodium (VOLTAREN ) 1 %  GEL, Apply topically to affected area qid, Disp: 100 g, Rfl: 2   Ginger, Zingiber officinalis, (GINGER ROOT) 550 MG CAPS, Take by mouth., Disp: , Rfl:    hydrocortisone  (ANUSOL -HC) 25 MG suppository, Place 1 suppository (25 mg total) rectally 2 (two) times daily., Disp: 12 suppository, Rfl: 0   ipratropium (ATROVENT ) 0.03 % nasal spray, Place 2 sprays into both nostrils every 12 (twelve) hours., Disp: 30 mL, Rfl: 12   omeprazole  (PRILOSEC ) 20 MG capsule, Take 1 capsule (20 mg total) by mouth 2 (two) times daily before a meal., Disp: 180 capsule, Rfl: 3   ondansetron  (ZOFRAN -ODT) 4 MG disintegrating tablet, TAKE 1 TABLET BY MOUTH EVERY 8 HOURS AS NEEDED FOR NAUSEA AND VOMITING, Disp: 180 tablet, Rfl: 3   OVER THE COUNTER MEDICATION, Cherry extract bid, Disp: , Rfl:    OVER THE COUNTER MEDICATION, B-12 one daily, Disp: , Rfl:    rosuvastatin  (CRESTOR ) 10 MG tablet, TAKE 1 TABLET BY MOUTH EVERY DAY, Disp: 90 tablet, Rfl: 1   sertraline  (ZOLOFT ) 50 MG tablet, TAKE 1 TABLET BY MOUTH EVERY DAY, Disp: 90 tablet, Rfl: 1   Sodium Bicarbonate POWD, by Does not apply route., Disp: , Rfl:    traMADol  (ULTRAM ) 50 MG tablet, Take 1 tablet (50 mg total) by mouth every 6 (six) hours as needed for moderate pain or severe pain (Do not drive for 6 hours after taking)., Disp: 60 tablet, Rfl: 0   triamcinolone  (NASACORT ) 55 MCG/ACT AERO nasal inhaler, Place 1 spray into the nose daily. Start with 1 spray each side twice a day for 3 days, then reduce to daily., Disp: 1 each, Rfl: 2   triamcinolone  cream (KENALOG ) 0.1 %, Apply 1 application topically 2 (two) times daily. For 7-10 days for recurring eczema issues, Disp: 80 g, Rfl: 1   TURMERIC PO, Take by mouth., Disp: , Rfl:    valACYclovir  (VALTREX ) 1000 MG tablet, Take 2 pills twice a day for 1 day at first sign of cold sore, Disp: 28 tablet, Rfl: 1   valsartan  (DIOVAN ) 80 MG tablet, Take 1 tablet (80 mg total) by mouth daily., Disp: 30 tablet, Rfl: 11   Physical Exam:    BP 138/77 (BP Location: Right Arm, Patient Position: Sitting, Cuff Size: Normal)   Pulse 89   Resp 19  Ht 5' 7 (1.702 m)   Wt 274 lb (124.3 kg)   SpO2 94%   BMI 42.91 kg/m   Salient findings:  CN II-XII intact Bilateral EAC clear and TM intact with well pneumatized middle ear spaces --  of note she does have some eczematoid change left; left plaque psoriasis left antihelix which she reports is stable Weber 512: mid Rinne 512: AC > BC b/l  Anterior rhinoscopy: Septum relatively midline; bilateral inferior turbinates without significant hypertrophy; Nasal endoscopy was indicated to better evaluate the nose and paranasal sinuses, given the patient's history and exam findings, and is detailed below. No lesions of oral cavity/oropharynx No obviously palpable neck masses/lymphadenopathy/thyromegaly No respiratory distress or stridor; voice quality class 2, easily tolerates secretions; TFL was indicated to better evaluate the proximal airway given cough, given the patient's history and exam findings, and is detailed below.   Seprately Identifiable Procedures:  Procedure Note Pre-procedure diagnosis: Chronic cough Post-procedure diagnosis: Same Procedure: Transnasal Fiberoptic Laryngoscopy, CPT 31575 - Mod 25 Indication: chronic cough Complications: None apparent EBL: 0 mL  The procedure was undertaken to further evaluate the patient's complaint of chronic cough, with mirror exam inadequate for appropriate examination due to gag reflex and poor patient tolerance  Procedure:  Patient was identified as correct patient. Verbal consent was obtained. The nose was sprayed with oxymetazoline and 4% lidocaine . The The flexible laryngoscope was passed through the nose to view the nasal cavity, pharynx (oropharynx, hypopharynx) and larynx.  The larynx was examined at rest and during multiple phonatory tasks. Documentation was obtained and reviewed with patient. The scope was removed. The patient  tolerated the procedure well.  Findings: The nasal cavity and nasopharynx did not reveal any masses or lesions, mucosa appeared to be without obvious lesions. The tongue base, pharyngeal walls, piriform sinuses, vallecula, epiglottis and postcricoid region are normal in appearance without significant retained secretions. The visualized portion of the subglottis and proximal trachea is widely patent. The vocal folds are mobile bilaterally. There are no lesions on the free edge of the vocal folds nor elsewhere in the larynx worrisome for malignancy.   Age appropriate VF b/l atrophy, mild AP compression suggestive of MTD   Electronically signed by: Eldora KATHEE Blanch, MD 12/16/2023 10:44 AM  PROCEDURE: Bilateral Diagnostic Rigid Nasal Endoscopy Pre-procedure diagnosis: Concern for chronic sinusitis, post nasal drip Post-procedure diagnosis: same Indication: See pre-procedure diagnosis and physical exam above Complications: None apparent EBL: 0 mL Anesthesia: Lidocaine  4% and topical decongestant was topically sprayed in each nasal cavity  Description of Procedure:  Patient was identified. A rigid 30 degree endoscope was utilized to evaluate the sinonasal cavities, mucosa, sinus ostia and turbinates and septum.  Overall, signs of mucosal inflammation are not noted..  No mucopurulence, polyps, or masses noted.   Right Middle meatus: clear Right SE Recess: clear Left MM: clear Left SE Recess: clear No masses over eustachian tube   Photodocumentation was obtained.  CPT CODE -- 68768 - Mod 25   Impression & Plans:  Angie Velasquez is a 78 y.o. female with:  1. Ear pain, left   2. Chronic eczematous otitis externa of left ear   3. Chronic cough   4. Post-nasal drip   5. Other chronic sinusitis   6. Hypertrophy of both inferior nasal turbinates   7. Nasal congestion    She had a multitude of issues today which we talked about.  Primarily her issue is left ear discomfort and some itching in  the ear although  the temporal relation ship for this is unclear.  She does have some eczematoid changes in the left ear and endoscopy is otherwise reassuring.  She does wear hearing aids bilaterally but these are not causing significant issue.  As such we will take a staged approach to management and start her on Ciprodex  drops to see if eczematoid changes are contributing to her symptoms.  If this does not resolve we can consider getting imaging to rule out any other cause.  She may also have some referred pain as the cause of her discomfort, and was diagnosed with TMJ by Dr. Carlie although she does not think that this is what is going on.  In addition, she continues to also have some nasal symptoms but these are much improved. She has been through multiple rounds of antibiotics and steroids but her nasal symptoms have improved so will manage conservatively.  Cough is also significantly improved after lisinopril  was discontinued, and we discussed management for this and opted for observation given the significant improvement. Recent exacerbation sounds like bronchitis and she's on abx for those currently  - Start Ciprodex  BID left ear x14d - f/u in 1 month  See below regarding exact medications prescribed this encounter including dosages and route: Meds ordered this encounter  Medications   ciprofloxacin -dexamethasone  (CIPRODEX ) OTIC suspension    Sig: Place 4 drops into the left ear 2 (two) times daily for 14 days.    Dispense:  7.5 mL    Refill:  1      Thank you for allowing me the opportunity to care for your patient. Please do not hesitate to contact me should you have any other questions.  Sincerely, Eldora Blanch, MD Otolarynoglogist (ENT), [facility] Phone: [phone] Fax: [phone]  12/16/2023, 10:44 AM   MDM:  Level 4 Complexity/Problems addressed: mod - multiple chronic problems Data complexity: mod - independent review of notes, labs and tests -  Morbidity: mod  - Prescription Drug prescribed or managed: yes

## 2023-12-13 ENCOUNTER — Encounter: Payer: Self-pay | Admitting: Family Medicine

## 2023-12-14 NOTE — Telephone Encounter (Signed)
 Contacted pt to schedule . [facility] appts were booked up for today ,  Offered other offices but pt declined because of possible bad weather this afternoon . Scheduled pt for Monday w/ Lucie Buttner . Patient states she will go to urgent care if symptoms worsen .

## 2023-12-17 ENCOUNTER — Ambulatory Visit: Payer: PPO | Admitting: Physician Assistant

## 2023-12-21 ENCOUNTER — Ambulatory Visit (INDEPENDENT_AMBULATORY_CARE_PROVIDER_SITE_OTHER): Payer: PPO | Admitting: Family Medicine

## 2023-12-21 ENCOUNTER — Encounter: Payer: Self-pay | Admitting: Family Medicine

## 2023-12-21 VITALS — BP 136/76 | HR 76 | Temp 97.7°F | Ht 67.0 in | Wt 273.4 lb

## 2023-12-21 DIAGNOSIS — E785 Hyperlipidemia, unspecified: Secondary | ICD-10-CM

## 2023-12-21 DIAGNOSIS — Z905 Acquired absence of kidney: Secondary | ICD-10-CM

## 2023-12-21 DIAGNOSIS — J029 Acute pharyngitis, unspecified: Secondary | ICD-10-CM

## 2023-12-21 DIAGNOSIS — N183 Chronic kidney disease, stage 3 unspecified: Secondary | ICD-10-CM

## 2023-12-21 DIAGNOSIS — J020 Streptococcal pharyngitis: Secondary | ICD-10-CM | POA: Diagnosis not present

## 2023-12-21 LAB — POCT RAPID STREP A (OFFICE): Rapid Strep A Screen: POSITIVE — AB

## 2023-12-21 MED ORDER — AMOXICILLIN 500 MG PO CAPS: 500.0000 mg | ORAL_CAPSULE | Freq: Two times a day (BID) | ORAL | 0 refills | Status: AC

## 2023-12-21 NOTE — Progress Notes (Signed)
Phone [phone] In person visit   Subjective:   Angie Velasquez is a 78 y.o. year old very pleasant female patient who presents for/with See problem oriented charting Chief Complaint  Patient presents with   Cough    Pt has lingering symptoms from bronchitis now with right ear pain.   Sore Throat   Ear Pain    Pt has not picked up Ciprodex ear drops that was sent in on 01/07 for her by ENT   Past Medical History-  Patient Active Problem List   Diagnosis Date Noted   Depression 12/19/2016    Priority: High   S/p nephrectomy 03/12/2015    Priority: High   Anxiety state 12/09/2009    Priority: High   Fatty liver 06/12/2022    Priority: Medium    CKD (chronic kidney disease), stage III (HCC) 02/14/2019    Priority: Medium    Prediabetes 10/22/2017    Priority: Medium    Hyperglycemia 09/18/2017    Priority: Medium    Essential hypertension 01/04/2016    Priority: Medium    Gout 03/12/2015    Priority: Medium    Hyperlipidemia 03/12/2015    Priority: Medium    COPD (chronic obstructive pulmonary disease) (HCC) 03/12/2015    Priority: Medium    Arthritis     Priority: Medium    Former smoker     Priority: Medium    Vitamin D deficiency 05/28/2009    Priority: Medium    Hematuria, unspecified 05/26/2009    Priority: Medium    Hearing loss 01/20/2009    Priority: Medium    MICROALBUMINURIA 01/23/2008    Priority: Medium    Osteoarthritis of spine with radiculopathy, cervical region 02/12/2018    Priority: Low   Intertrigo 01/10/2018    Priority: Low   History of osteitis pubis 12/19/2017    Priority: Low   Eczema 06/29/2015    Priority: Low   Tinnitus 03/12/2015    Priority: Low   History of colonic polyps 03/12/2015    Priority: Low   Bimalleolar fracture 07/02/2014    Priority: Low   Arthropathy 05/26/2009    Priority: Low   Insomnia, unspecified 04/27/2009    Priority: Low   DDD (degenerative disc disease), lumbosacral 01/20/2009     Priority: Low   Sialoadenitis 01/20/2009    Priority: Low   GERD 01/23/2008    Priority: Low   Microscopic hematuria 01/23/2008    Priority: Low   UTI (urinary tract infection) 01/08/2008    Priority: Low   Obesity, morbid (HCC) 11/21/2023   Upper airway cough syndrome 08/09/2023   Snoring 01/06/2020   Atypical chest pain 03/26/2017   Contact dermatitis and other eczema, due to unspecified cause 05/26/2009    Medications- reviewed and updated Current Outpatient Medications  Medication Sig Dispense Refill   albuterol (VENTOLIN HFA) 108 (90 Base) MCG/ACT inhaler Inhale 2 puffs into the lungs every 6 (six) hours as needed for wheezing or shortness of breath. 18 g 2   allopurinol (ZYLOPRIM) 300 MG tablet TAKE 1 TABLET BY MOUTH 2 TIMES DAILY. 180 tablet 2   amoxicillin (AMOXIL) 500 MG capsule Take 1 capsule (500 mg total) by mouth 2 (two) times daily for 10 days. 20 capsule 0   benzonatate (TESSALON) 100 MG capsule TAKE 1 CAPSULE BY MOUTH THREE TIMES A DAY AS NEEDED 60 capsule 0   budesonide-formoterol (SYMBICORT) 160-4.5 MCG/ACT inhaler Inhale 2 puffs into the lungs 2 (two) times daily. 1 each 1  CALCIUM-MAGNESIUM-ZINC PO Take by mouth.     ciprofloxacin-dexamethasone (CIPRODEX) OTIC suspension Place 4 drops into the left ear 2 (two) times daily for 14 days. 7.5 mL 1   clonazePAM (KLONOPIN) 1 MG tablet TAKE 0.5- 1 TABLET(0.5 MG- 1 mg) BY MOUTH AT BEDTIME AS NEEDED. Do not drive for at least 8 hours after taking 90 tablet 1   Clotrimazole POWD 1 application  by Does not apply route 2 (two) times daily. Apply after applying Clotrimazole cream. 25 g 1   clotrimazole-betamethasone (LOTRISONE) cream APPLY 1 APPLICATION TOPICALLY 2 (TWO) TIMES DAILY. DRY SKIN WELL FIRST, THEN APPLY TO RASH UNDER YOUR STOMACH AND GROIN AREAS. 15 g 1   CRANBERRY PO Take by mouth.     diazepam (VALIUM) 10 MG tablet Take 1 tablet (10 mg total) by mouth every 6 (six) hours as needed (as needed for muscle spasms). 30  tablet 0   diclofenac sodium (VOLTAREN) 1 % GEL Apply topically to affected area qid 100 g 2   Ginger, Zingiber officinalis, (GINGER ROOT) 550 MG CAPS Take by mouth.     hydrocortisone (ANUSOL-HC) 25 MG suppository Place 1 suppository (25 mg total) rectally 2 (two) times daily. 12 suppository 0   ipratropium (ATROVENT) 0.03 % nasal spray Place 2 sprays into both nostrils every 12 (twelve) hours. 30 mL 12   omeprazole (PRILOSEC) 20 MG capsule Take 1 capsule (20 mg total) by mouth 2 (two) times daily before a meal. 180 capsule 3   ondansetron (ZOFRAN-ODT) 4 MG disintegrating tablet TAKE 1 TABLET BY MOUTH EVERY 8 HOURS AS NEEDED FOR NAUSEA AND VOMITING 180 tablet 3   OVER THE COUNTER MEDICATION Cherry extract bid     OVER THE COUNTER MEDICATION B-12 one daily     rosuvastatin (CRESTOR) 10 MG tablet TAKE 1 TABLET BY MOUTH EVERY DAY 90 tablet 1   sertraline (ZOLOFT) 50 MG tablet TAKE 1 TABLET BY MOUTH EVERY DAY 90 tablet 1   Sodium Bicarbonate POWD by Does not apply route.     traMADol (ULTRAM) 50 MG tablet Take 1 tablet (50 mg total) by mouth every 6 (six) hours as needed for moderate pain or severe pain (Do not drive for 6 hours after taking). 60 tablet 0   triamcinolone (NASACORT) 55 MCG/ACT AERO nasal inhaler Place 1 spray into the nose daily. Start with 1 spray each side twice a day for 3 days, then reduce to daily. 1 each 2   triamcinolone cream (KENALOG) 0.1 % Apply 1 application topically 2 (two) times daily. For 7-10 days for recurring eczema issues 80 g 1   TURMERIC PO Take by mouth.     valACYclovir (VALTREX) 1000 MG tablet Take 2 pills twice a day for 1 day at first sign of cold sore 28 tablet 1   valsartan (DIOVAN) 80 MG tablet Take 1 tablet (80 mg total) by mouth daily. 30 tablet 11   No current facility-administered medications for this visit.     Objective:  BP 136/76   Pulse 76   Temp 97.7 F (36.5 C)   Ht 5\' 7"  (1.702 m)   Wt 273 lb 6.4 oz (124 kg)   SpO2 96%   BMI 42.82  kg/m  Gen: NAD, resting comfortably Pharynx erythematous with bilateral tonsillar enlargement, bilateral tympanic membranes normal but mild sensitivity to external ear exam on the right CV: RRR no murmurs rubs or gallops Lungs: CTAB no crackles, wheeze, rhonchi Ext: no edema Skin: warm, dry  Results for orders placed or performed in visit on 12/21/23 (from the past 24 hours)  POCT rapid strep A     Status: Abnormal   Collection Time: 12/21/23  3:18 PM  Result Value Ref Range   Rapid Strep A Screen Positive (A) Negative       Assessment and Plan    # Cough/congestion/right ear pain/sore throat S:  -Patient had an e-visit for cough on 12/10/2023-was treated for bronchitis with azithromycin.   -She then saw Dr. Allena Katz on 12/11/2023 for left ear discomfort-she had some eczematous change in the ear and Ciprodex was sent in to see if this will be beneficial.  They were considering imaging if symptoms fail to improve.  We also considered TMJ as a potential cause.  She reported nasal symptoms were much improved.  She did not pick the Ciprodex up- was $200.  Plan was for 1 month follow-up. Thankfully prior left ear pain has resolved -On 12/13/2023 she complained of diarrhea related to azithromycin and we recommended stopping the antibiotics  -cough and congestion is doing much better. No fever/chills/shortness of breath. Nausea but ongoing issue - sore throat and right ear pain started yesterday A/P: unfortunately you have developed strep throat- treat with amoxicillin twice daily for 10 days- take with food. Hoping no diarrhea with this. Lungs were clear thankfully today . I think the ear pain is referred pain  #Hyperlipidemia with calcium on CT coronary artery calcifications S: Compliant with simvastatin 20 mg  Lab Results  Component Value Date   CHOL 134 11/16/2023   HDL 56.20 11/16/2023   LDLCALC 34 11/16/2023   LDLDIRECT 104.0 04/23/2023   TRIG 221.0 (H) 11/16/2023   CHOLHDL 2  11/16/2023  A/P: Very well-controlled on recent check-continue current medication-work on lifestyle to bring down triglycerides  #Unilateral kidney/microalbuminuria/CKD stage III # Hypertension S: medication: valsartan 80 mg -primarily on for renal protective effects with history of microalbuminuria but also controls blood pressure A/P: Patient continues to be very concerned about her kidney function but we discussed with only unilateral kidney and GFR at 36-I still think she is doing reasonably well-continue to monitor as CKD stage III is roughly stable.  Microalbumin to creatinine ratio is actually trending down thankfully Hypertension-well-controlled-continue current medication with valsartan 80 mg  Recommended follow up: Return for as needed for new, worsening, persistent symptoms. Future Appointments  Date Time Provider Department Center  01/11/2024  2:15 PM [facility] [facility] None    Lab/Order associations:   ICD-10-CM   1. Strep throat  J02.0     2. Sore throat  J02.9 POCT rapid strep A    3. Stage 3 chronic kidney disease, unspecified whether stage 3a or 3b CKD (HCC)  N18.30     4. S/p nephrectomy  Z90.5     5. Hyperlipidemia, unspecified hyperlipidemia type  E78.5       Meds ordered this encounter  Medications   amoxicillin (AMOXIL) 500 MG capsule    Sig: Take 1 capsule (500 mg total) by mouth 2 (two) times daily for 10 days.    Dispense:  20 capsule    Refill:  0    Return precautions advised.  Tana Conch, MD

## 2023-12-21 NOTE — Patient Instructions (Addendum)
unfortunately you have developed strep throat- treat with amoxicillin twice daily for 10 days- take with food. Hoping no diarrhea with this. Lungs were clear thankfully today . I think the ear pain is referred pain  Recommended follow up: Return for as needed for new, worsening, persistent symptoms.

## 2023-12-26 ENCOUNTER — Other Ambulatory Visit: Payer: Self-pay

## 2023-12-26 MED ORDER — TRAMADOL HCL 50 MG PO TABS: 50.0000 mg | ORAL_TABLET | Freq: Four times a day (QID) | ORAL | 0 refills | Status: DC | PRN

## 2023-12-28 ENCOUNTER — Ambulatory Visit (INDEPENDENT_AMBULATORY_CARE_PROVIDER_SITE_OTHER): Payer: PPO | Admitting: Otolaryngology

## 2023-12-28 ENCOUNTER — Other Ambulatory Visit: Payer: Self-pay | Admitting: Family Medicine

## 2023-12-28 ENCOUNTER — Encounter (INDEPENDENT_AMBULATORY_CARE_PROVIDER_SITE_OTHER): Payer: Self-pay

## 2023-12-28 VITALS — BP 129/59 | HR 83 | Resp 19

## 2023-12-28 DIAGNOSIS — R0982 Postnasal drip: Secondary | ICD-10-CM | POA: Diagnosis not present

## 2023-12-28 DIAGNOSIS — R053 Chronic cough: Secondary | ICD-10-CM | POA: Diagnosis not present

## 2023-12-28 DIAGNOSIS — H608X2 Other otitis externa, left ear: Secondary | ICD-10-CM | POA: Diagnosis not present

## 2023-12-28 DIAGNOSIS — H9202 Otalgia, left ear: Secondary | ICD-10-CM | POA: Diagnosis not present

## 2023-12-28 DIAGNOSIS — J029 Acute pharyngitis, unspecified: Secondary | ICD-10-CM

## 2023-12-28 MED ORDER — OFLOXACIN 0.3 % OT SOLN: 4.0000 [drp] | Freq: Every day | OTIC | 1 refills | Status: AC

## 2023-12-28 MED ORDER — BETAMETHASONE DIPROPIONATE 0.05 % EX CREA: TOPICAL_CREAM | Freq: Two times a day (BID) | CUTANEOUS | 0 refills | Status: AC

## 2023-12-28 NOTE — Progress Notes (Signed)
Dear Dr. Durene Cal, Here is my assessment for our mutual patient, Angie Velasquez. Thank you for allowing me the opportunity to care for your patient. Please do not hesitate to contact me should you have any other questions. Sincerely, Dr. Jovita Kussmaul  Otolaryngology Clinic Note Referring provider: Dr. Durene Cal HPI:  Angie Velasquez is a 78 y.o. female kindly referred by Dr. Durene Cal for evaluation of left ear discomfort  Initial visit (12/2023): Patient reports: chronic left ear discomfort and fullness, ongoing since last year since she had a cough and sinus pressure issues. No other antecedent event, random. Lasts about an hour, sometimes has to take tylenol. Worse when she coughs. Sweet oil and tylenol helps. She also reports that she is having some ear itching on the left, which can get really intense. She does not know if the itching is related to the pain. She has tried multiple courses of abx/steroids and it has not helped much She is using HA in both ears - pain not connected to the hearing aid use. Has had tinnitus bilateral for a while, non-pulsatile. Hearing is also worse in left ear but this is chronic and no recent change.  She has seen Dr. Sherene Sires for chronic cough, which is much improved after d/c lisinopril. Still some issue, but much improved. Dry cough, intermittent. Not productivity, no fevers. Denies GERD Sx. Never had these problems until she used the CPAP.  Was having some URI sx including feeling tired yesterday, and is on abx for Bronchitis. This is more of a productive cough but no sig sinonasal sx.  Patient denies: fullness, vertigo, drainage Patient additionally denies: eustachian tube symptoms such as popping, crackling, sensitive to pressure changes Patient also denies barotrauma, vestibular suppressant use, ototoxic medication use Prior ear surgery: no Ears generally not a problem.  Patient otherwise denies: - dysphagia, odynophagia, aspiration episodes or PNA, need  for Heimlich, unintentional weight loss - changes in voice, shortness of breath, hemoptysis, significant alcohol history - ear pain, neck masses  No significant sinus problems currently; does get bronchitis; denies facial pressure but does report some congestion, no discolored drainage or hyposmia; some PND She does not think this is TMJ, for which she was diagnosed by Dr. Jenne Pane.  --------------------------------------------------------- 12/28/2023 She reports that her left ear fullness and discomfort is much better. She has been doing some oil drops. Itching has resolved. She reports that she is using a HA in both ears. Tinnitus continues, no drainage. Of note, now she reports that she is having right ear pain and sore throat. She was diagnosed with strep pharyngitis and is being treated for this. Some dysphonia/laryngitis as well. No fevers.   --------------------------------------------------------------- H&N Surgery: no Personal or FHx of bleeding dz or anesthesia difficulty: no  AP/AC: no  Tobacco: quit 15 years ago; smoked 1 PPD.  PMHx: CKD, Nausea, COPD, Pre-diabetes, Gout, Unilateral Kidney, HLD  Independent Review of Additional Tests or Records:  Dr. Durene Cal 12/21/2023: cough, sore throat, ear pain. Rx: azithromycin for bronchitis, cough/congesiton doing better. Dx: Strep, R amoxicillin.  Labs: Strep 12/21/2023: positive Primary Care Notes (Dr. Durene Cal and Jarold Motto) - 2/26//2024 - noted left ear pain and itching, worsening, prior Rx with azithromycin, nasal spray; Dx ith ear infxn, trialed omnicef, Ref to ENT -- Persistent so saw Dr. Durene Cal (04/27/2023): feels more sinus related since improvement there makes it seem like ear feels better; had COPD Exacerbation 05/2023 and was Rx doxy Dr. Durene Cal 05/18/2023: Noted sinus pressure/allergies/feeling stopped up; continued congestion and improvement with  flonase/claritin; need SOB and tachy so seen in ED, no PE; Rx: finish doxy, symbicort;  hold lisinopril; Dr. Durene Cal (06/04/2023): Ongoing cough/congestion, ref to pulm, now with ear pain June 30, no iprovement despite doxy/pred. Still with pressure in sinuses; Dx: AOM and sinusitis; Rx: Augmentin; stil no improvement, Rx: z-pak Stephanie Hudnell (07/05/2023): Sinus pain (frontal pressure), Rx: nasacort Abx: Azithromycin 01/24/2023 for bronchitis; Doxy 01/2023; Doxy 05/2023 - for COPD Exacerbation; Augmentin 06/2023; Z-pak 06/2023  Dr. Jenne Pane (ENT) - 03/20/2023 - seen for earache, Rx with z-pak and doxy for presumed sinusitis, continued ache; Dx with TMJ, rec ibuprofen, heating pad, and soft diet Dr. Sherene Sires [facility]) - 9/52024: cough/congesiton 3 months; ; d/c'd ACE in 08/09/2023, max Rx GERD; symbicort; Rx UACS; f/u in 4-6 weeks; now with significant improvement EGD (05/03/2023):   Labs 11/2023:  CMP 11/16/2023 - Cr 1.40, BUN 21 CBC 11/16/2023: WBC 7.5, Hgb 12.6  PMH/Meds/All/SocHx/FamHx/ROS:   Past Medical History:  Diagnosis Date   Anxiety    years ago - no longer an issue   Arthritis    gout   Cataracts, bilateral    Complication of anesthesia    Fracture of pelvis (HCC)    GERD (gastroesophageal reflux disease)    Hx of chest pain    "anxiety"   Hyperlipidemia    INSOMNIA 10/14/2008   in past   Joint pain    Kidney problem    Low blood sugar    eats several smaller meals a day   Obesity    Palpitations    Stress related    Proteinuria    H/O   Tobacco abuse    quit smoking 08/2013     Past Surgical History:  Procedure Laterality Date   ABDOMINAL HYSTERECTOMY     APPENDECTOMY     with hysterectomy   childbirth     x4   CHOLECYSTECTOMY  2001   EYE SURGERY Bilateral 2015   cataract surgery with lens implant   NEPHRECTOMY  1992   complex cystic mass - nephrectomy right   ORIF ANKLE FRACTURE Right 07/02/2014   Procedure: OPEN REDUCTION INTERNAL FIXATION (ORIF) RIGHT ANKLE BIMALLOELAR FRACTURE;  Surgeon: Toni Arthurs, MD;  Location: [facility] OR;  Service: Orthopedics;   Laterality: Right;   OTHER SURGICAL HISTORY     hysterectomy, R ovary remains   SHOULDER SURGERY     bone spurs left    Family History  Problem Relation Age of Onset   Ovarian cancer Mother        Dr. Darnelle Catalan    Arthritis Mother    Obesity Mother    Stroke Father    Alzheimer's disease Father        early, states also parkinsons. died 52   Hypertension Father    Obesity Father    Colon cancer Paternal Grandmother    Liver disease Daughter        On hospice age 38   Esophageal cancer Neg Hx    Stomach cancer Neg Hx    Rectal cancer Neg Hx      Social Connections: Moderately Integrated (12/16/2023)   Social Connection and Isolation Panel [NHANES]    Frequency of Communication with Friends and Family: More than three times a week    Frequency of Social Gatherings with Friends and Family: More than three times a week    Attends Religious Services: More than 4 times per year    Active Member of [employer] or Organizations: Yes    Attends [employer]  or Organization Meetings: More than 4 times per year    Marital Status: Divorced      Current Outpatient Medications:    albuterol (VENTOLIN HFA) 108 (90 Base) MCG/ACT inhaler, Inhale 2 puffs into the lungs every 6 (six) hours as needed for wheezing or shortness of breath., Disp: 18 g, Rfl: 2   allopurinol (ZYLOPRIM) 300 MG tablet, TAKE 1 TABLET BY MOUTH TWICE A DAY, Disp: 180 tablet, Rfl: 2   amoxicillin (AMOXIL) 500 MG capsule, Take 1 capsule (500 mg total) by mouth 2 (two) times daily for 10 days., Disp: 20 capsule, Rfl: 0   benzonatate (TESSALON) 100 MG capsule, TAKE 1 CAPSULE BY MOUTH THREE TIMES A DAY AS NEEDED, Disp: 60 capsule, Rfl: 0   betamethasone dipropionate 0.05 % cream, Apply topically 2 (two) times daily. Apply to left ear for 7 days when itches., Disp: 30 g, Rfl: 0   budesonide-formoterol (SYMBICORT) 160-4.5 MCG/ACT inhaler, Inhale 2 puffs into the lungs 2 (two) times daily., Disp: 1 each, Rfl: 1   CALCIUM-MAGNESIUM-ZINC PO,  Take by mouth., Disp: , Rfl:    clonazePAM (KLONOPIN) 1 MG tablet, TAKE 0.5- 1 TABLET(0.5 MG- 1 mg) BY MOUTH AT BEDTIME AS NEEDED. Do not drive for at least 8 hours after taking, Disp: 90 tablet, Rfl: 1   Clotrimazole POWD, 1 application  by Does not apply route 2 (two) times daily. Apply after applying Clotrimazole cream., Disp: 25 g, Rfl: 1   clotrimazole-betamethasone (LOTRISONE) cream, APPLY 1 APPLICATION TOPICALLY 2 (TWO) TIMES DAILY. DRY SKIN WELL FIRST, THEN APPLY TO RASH UNDER YOUR STOMACH AND GROIN AREAS., Disp: 15 g, Rfl: 1   CRANBERRY PO, Take by mouth., Disp: , Rfl:    diazepam (VALIUM) 10 MG tablet, Take 1 tablet (10 mg total) by mouth every 6 (six) hours as needed (as needed for muscle spasms)., Disp: 30 tablet, Rfl: 0   diclofenac sodium (VOLTAREN) 1 % GEL, Apply topically to affected area qid, Disp: 100 g, Rfl: 2   Ginger, Zingiber officinalis, (GINGER ROOT) 550 MG CAPS, Take by mouth., Disp: , Rfl:    hydrocortisone (ANUSOL-HC) 25 MG suppository, Place 1 suppository (25 mg total) rectally 2 (two) times daily., Disp: 12 suppository, Rfl: 0   ipratropium (ATROVENT) 0.03 % nasal spray, Place 2 sprays into both nostrils every 12 (twelve) hours., Disp: 30 mL, Rfl: 12   ofloxacin (FLOXIN) 0.3 % OTIC solution, Place 4 drops into both ears daily for 7 days., Disp: 5 mL, Rfl: 1   omeprazole (PRILOSEC) 20 MG capsule, TAKE 1 CAPSULE (20 MG TOTAL) BY MOUTH 2 (TWO) TIMES DAILY BEFORE A MEAL., Disp: 180 capsule, Rfl: 3   ondansetron (ZOFRAN-ODT) 4 MG disintegrating tablet, TAKE 1 TABLET BY MOUTH EVERY 8 HOURS AS NEEDED FOR NAUSEA AND VOMITING, Disp: 180 tablet, Rfl: 3   OVER THE COUNTER MEDICATION, Cherry extract bid, Disp: , Rfl:    OVER THE COUNTER MEDICATION, B-12 one daily, Disp: , Rfl:    rosuvastatin (CRESTOR) 10 MG tablet, TAKE 1 TABLET BY MOUTH EVERY DAY, Disp: 90 tablet, Rfl: 1   sertraline (ZOLOFT) 50 MG tablet, TAKE 1 TABLET BY MOUTH EVERY DAY, Disp: 90 tablet, Rfl: 1   Sodium  Bicarbonate POWD, by Does not apply route., Disp: , Rfl:    traMADol (ULTRAM) 50 MG tablet, Take 1 tablet (50 mg total) by mouth every 6 (six) hours as needed for moderate pain (pain score 4-6) or severe pain (pain score 7-10) (Do not drive for 6 hours  after taking)., Disp: 60 tablet, Rfl: 0   triamcinolone (NASACORT) 55 MCG/ACT AERO nasal inhaler, Place 1 spray into the nose daily. Start with 1 spray each side twice a day for 3 days, then reduce to daily., Disp: 1 each, Rfl: 2   triamcinolone cream (KENALOG) 0.1 %, Apply 1 application topically 2 (two) times daily. For 7-10 days for recurring eczema issues, Disp: 80 g, Rfl: 1   TURMERIC PO, Take by mouth., Disp: , Rfl:    valACYclovir (VALTREX) 1000 MG tablet, Take 2 pills twice a day for 1 day at first sign of cold sore, Disp: 28 tablet, Rfl: 1   valsartan (DIOVAN) 80 MG tablet, Take 1 tablet (80 mg total) by mouth daily., Disp: 30 tablet, Rfl: 11   Physical Exam:   BP (!) 129/59 (BP Location: Right Arm, Patient Position: Sitting)   Pulse 83   Resp 19   SpO2 96%   Salient findings:  CN II-XII intact Bilateral EAC clear and TM intact with well pneumatized middle ear spaces --  of note she does have some eczematoid change left which actually has improved; left plaque psoriasis left antihelix which she reports is stable Weber 512: mid Rinne 512: AC > BC b/l  Anterior rhinoscopy: Septum relatively midline; bilateral inferior turbinates without significant hypertrophy No lesions of oral cavity/oropharynx but oropharynx does look erythematous; no evidence of PTA or tonsillar pillar swelling No obviously palpable neck masses/lymphadenopathy/thyromegaly No respiratory distress or stridor; voice quality class 3 today - some raspiness   Seprately Identifiable Procedures:  Prior, not today: Procedure:  Patient was identified as correct patient. Verbal consent was obtained. The nose was sprayed with oxymetazoline and 4% lidocaine. The The flexible  laryngoscope was passed through the nose to view the nasal cavity, pharynx (oropharynx, hypopharynx) and larynx.  The larynx was examined at rest and during multiple phonatory tasks. Documentation was obtained and reviewed with patient. The scope was removed. The patient tolerated the procedure well.  Findings: The nasal cavity and nasopharynx did not reveal any masses or lesions, mucosa appeared to be without obvious lesions. The tongue base, pharyngeal walls, piriform sinuses, vallecula, epiglottis and postcricoid region are normal in appearance without significant retained secretions. The visualized portion of the subglottis and proximal trachea is widely patent. The vocal folds are mobile bilaterally. There are no lesions on the free edge of the vocal folds nor elsewhere in the larynx worrisome for malignancy.   Age appropriate VF b/l atrophy, mild AP compression suggestive of MTD   Description of Procedure:  Patient was identified. A rigid 30 degree endoscope was utilized to evaluate the sinonasal cavities, mucosa, sinus ostia and turbinates and septum.  Overall, signs of mucosal inflammation are not noted..  No mucopurulence, polyps, or masses noted.   Right Middle meatus: clear Right SE Recess: clear Left MM: clear Left SE Recess: clear No masses over eustachian tube   Photodocumentation was obtained.  Impression & Plans:  Alle Difabio is a 78 y.o. female with:  1. Ear pain, left   2. Chronic eczematous otitis externa of left ear   3. Post-nasal drip   4. Chronic cough   5. Sore throat    Her prior issue of left ear discomfort Primarily her issue is left ear discomfort and itching has resolved after oil ear drops. She is not having any significant PND/Cough at this point. Ciprodex was too expensive. Unfortunately, she now has a sore throat and right ear pain with some dysphonia with strep  positivity and diagnosed with pharyngitis. Exam today consistent with it with some erythema  in posterior oropharynx, but I cannot see any evidence of PTA or abscess today. We discussed that given prior reassuring exam, most likely has pharyngitis and referred pain. She is already on antibiotics for this.   Cough is also significantly improved after lisinopril was discontinued, and we discussed management for this and opted for observation given the significant improvement.  - As such, recommend continued management; suspect ear pain will improve once sore throat/pharyngitis resolved - If left ear starts to itch again, will do betamethasone ointment and ofloxacin drops 4 drops BID (see below)  - d/w pt f/u and she opted PRN  See below regarding exact medications prescribed this encounter including dosages and route: Meds ordered this encounter  Medications   ofloxacin (FLOXIN) 0.3 % OTIC solution    Sig: Place 4 drops into both ears daily for 7 days.    Dispense:  5 mL    Refill:  1   betamethasone dipropionate 0.05 % cream    Sig: Apply topically 2 (two) times daily. Apply to left ear for 7 days when itches.    Dispense:  30 g    Refill:  0      Thank you for allowing me the opportunity to care for your patient. Please do not hesitate to contact me should you have any other questions.  Sincerely, Jovita Kussmaul, MD Otolarynoglogist (ENT), [facility] Phone: [phone] Fax: [phone]  12/28/2023, 1:26 PM   MDM:  Level 4 - 99214  Complexity/Problems addressed: mod - chronic problem, and a new problem Data complexity: low - independent review of notes, labs - Morbidity: mod  - Prescription Drug prescribed or managed: yes

## 2024-01-01 DIAGNOSIS — G4733 Obstructive sleep apnea (adult) (pediatric): Secondary | ICD-10-CM | POA: Diagnosis not present

## 2024-01-04 MED ORDER — PREDNISONE 20 MG PO TABS: ORAL_TABLET | ORAL | 0 refills | Status: DC

## 2024-01-11 ENCOUNTER — Ambulatory Visit (INDEPENDENT_AMBULATORY_CARE_PROVIDER_SITE_OTHER): Payer: PPO

## 2024-01-25 ENCOUNTER — Telehealth: Payer: Self-pay | Admitting: Pharmacy Technician

## 2024-01-25 ENCOUNTER — Other Ambulatory Visit (HOSPITAL_COMMUNITY): Payer: Self-pay

## 2024-01-25 NOTE — Telephone Encounter (Signed)
Pharmacy Patient Advocate Encounter   Received notification from CoverMyMeds that prior authorization for Ondansetron 4MG  dispersible tablets is required/requested.   Insurance verification completed.   The patient is insured through [facility] ADVANTAGE/RX ADVANCE .  **Could not complete PA in CoverMyMeds. Requested PA form from the patients insurance. Waiting for the fax.**

## 2024-01-30 ENCOUNTER — Other Ambulatory Visit (HOSPITAL_COMMUNITY): Payer: Self-pay

## 2024-01-30 NOTE — Telephone Encounter (Signed)
 Clinical questions have been answered and PA submitted. PA currently Pending. Please be advised that most companies allow up to 30 days to make a decision. We will advise when a determination has been made, or follow up in 1 week.   Please reach out to our team, Rx Prior Auth Pool, if you haven't heard back in a week.   Faxed to [employer] [phone]

## 2024-01-31 NOTE — Telephone Encounter (Signed)
 Pharmacy Patient Advocate Encounter  Received notification from [facility] ADVANTAGE/RX ADVANCE that Prior Authorization for Ondansetron 4MG  dispersible tablets has been DENIED.  Full denial letter will be uploaded to the media tab. See denial reason below.  We denied this request under Medicare Part D because: Drug not prescribed for a medically accepted indication.  Part D sponsors are responsible for ensuring that covered Part D drugs are being prescribed for "medically-accepted indications" (those approved by the Food and Drug Administration (FDA) or those supported by other Medicare-approved references; for a list of Medicare-approved references, please refer to the compendia described in section 1927(g)(1)(B)(I) of the Social Security Act).  Additionally, the requested drug must meet the following therapeutic criteria: diagnosis of chemotherapy or radiation induced nausea/vomiting or prophylaxis of post-surgical nausea/vomiting, before induction of anesthesia of shortly after surgery.  Your request was denied because it is being used for an indication which is not approved or medically-accepted: nausea with vomiting,  PA #/Case ID/Reference #: [id number]

## 2024-02-06 ENCOUNTER — Encounter: Payer: Self-pay | Admitting: Family Medicine

## 2024-02-06 ENCOUNTER — Other Ambulatory Visit: Payer: Self-pay | Admitting: Family Medicine

## 2024-02-11 ENCOUNTER — Encounter: Payer: Self-pay | Admitting: Family Medicine

## 2024-02-12 ENCOUNTER — Other Ambulatory Visit: Payer: Self-pay

## 2024-02-12 DIAGNOSIS — N183 Chronic kidney disease, stage 3 unspecified: Secondary | ICD-10-CM

## 2024-02-15 NOTE — Telephone Encounter (Signed)
 See below

## 2024-03-27 ENCOUNTER — Other Ambulatory Visit: Payer: Self-pay | Admitting: Family Medicine

## 2024-04-15 ENCOUNTER — Other Ambulatory Visit: Payer: Self-pay | Admitting: Family Medicine

## 2024-04-25 DIAGNOSIS — I129 Hypertensive chronic kidney disease with stage 1 through stage 4 chronic kidney disease, or unspecified chronic kidney disease: Secondary | ICD-10-CM | POA: Diagnosis not present

## 2024-04-25 DIAGNOSIS — R319 Hematuria, unspecified: Secondary | ICD-10-CM | POA: Diagnosis not present

## 2024-04-25 DIAGNOSIS — R110 Nausea: Secondary | ICD-10-CM | POA: Diagnosis not present

## 2024-04-25 DIAGNOSIS — N1832 Chronic kidney disease, stage 3b: Secondary | ICD-10-CM | POA: Diagnosis not present

## 2024-04-25 DIAGNOSIS — E669 Obesity, unspecified: Secondary | ICD-10-CM | POA: Diagnosis not present

## 2024-04-25 DIAGNOSIS — N1831 Chronic kidney disease, stage 3a: Secondary | ICD-10-CM | POA: Diagnosis not present

## 2024-04-25 LAB — LAB REPORT - SCANNED
Albumin, Urine POC: >400
Albumin/Creatinine Ratio, Urine, POC: >641
Creatinine, POC: 62.4 mg/dL
EGFR: 48

## 2024-05-12 ENCOUNTER — Encounter: Payer: Self-pay | Admitting: Family Medicine

## 2024-05-12 DIAGNOSIS — Z131 Encounter for screening for diabetes mellitus: Secondary | ICD-10-CM

## 2024-05-12 DIAGNOSIS — R739 Hyperglycemia, unspecified: Secondary | ICD-10-CM

## 2024-05-12 DIAGNOSIS — I10 Essential (primary) hypertension: Secondary | ICD-10-CM

## 2024-05-12 DIAGNOSIS — N183 Chronic kidney disease, stage 3 unspecified: Secondary | ICD-10-CM

## 2024-05-14 ENCOUNTER — Other Ambulatory Visit (INDEPENDENT_AMBULATORY_CARE_PROVIDER_SITE_OTHER)

## 2024-05-14 DIAGNOSIS — N183 Chronic kidney disease, stage 3 unspecified: Secondary | ICD-10-CM | POA: Diagnosis not present

## 2024-05-14 DIAGNOSIS — Z131 Encounter for screening for diabetes mellitus: Secondary | ICD-10-CM

## 2024-05-14 DIAGNOSIS — R739 Hyperglycemia, unspecified: Secondary | ICD-10-CM

## 2024-05-14 DIAGNOSIS — I10 Essential (primary) hypertension: Secondary | ICD-10-CM

## 2024-05-14 LAB — COMPREHENSIVE METABOLIC PANEL WITH GFR
ALT: 31 U/L (ref 0–35)
AST: 30 U/L (ref 0–37)
Albumin: 4.2 g/dL (ref 3.5–5.2)
Alkaline Phosphatase: 87 U/L (ref 39–117)
BUN: 14 mg/dL (ref 6–23)
CO2: 27 meq/L (ref 19–32)
Calcium: 9.1 mg/dL (ref 8.4–10.5)
Chloride: 106 meq/L (ref 96–112)
Creatinine, Ser: 1.18 mg/dL (ref 0.40–1.20)
GFR: 44.72 mL/min — ABNORMAL LOW (ref >60.00)
Glucose, Bld: 118 mg/dL — ABNORMAL HIGH (ref 70–99)
Potassium: 4.1 meq/L (ref 3.5–5.1)
Sodium: 140 meq/L (ref 135–145)
Total Bilirubin: 0.4 mg/dL (ref 0.2–1.2)
Total Protein: 6.6 g/dL (ref 6.0–8.3)

## 2024-05-14 LAB — CBC WITH DIFFERENTIAL/PLATELET
Basophils Absolute: 0.1 10*3/uL (ref 0.0–0.1)
Basophils Relative: 0.8 % (ref 0.0–3.0)
Eosinophils Absolute: 0.2 10*3/uL (ref 0.0–0.7)
Eosinophils Relative: 3.2 % (ref 0.0–5.0)
HCT: 39.1 % (ref 36.0–46.0)
Hemoglobin: 12.9 g/dL (ref 12.0–15.0)
Lymphocytes Relative: 38.2 % (ref 12.0–46.0)
Lymphs Abs: 2.6 10*3/uL (ref 0.7–4.0)
MCHC: 33 g/dL (ref 30.0–36.0)
MCV: 91.3 fl (ref 78.0–100.0)
Monocytes Absolute: 0.5 10*3/uL (ref 0.1–1.0)
Monocytes Relative: 7 % (ref 3.0–12.0)
Neutro Abs: 3.5 10*3/uL (ref 1.4–7.7)
Neutrophils Relative %: 50.8 % (ref 43.0–77.0)
Platelets: 210 10*3/uL (ref 150.0–400.0)
RBC: 4.28 Mil/uL (ref 3.87–5.11)
RDW: 15.6 % — ABNORMAL HIGH (ref 11.5–15.5)
WBC: 6.8 10*3/uL (ref 4.0–10.5)

## 2024-05-14 LAB — HEMOGLOBIN A1C: Hgb A1c MFr Bld: 6.2 % (ref 4.6–6.5)

## 2024-05-15 ENCOUNTER — Encounter: Payer: Self-pay | Admitting: Family Medicine

## 2024-05-15 ENCOUNTER — Ambulatory Visit (INDEPENDENT_AMBULATORY_CARE_PROVIDER_SITE_OTHER): Admitting: Family Medicine

## 2024-05-15 VITALS — BP 118/76 | HR 80 | Temp 98.0°F | Ht 67.0 in | Wt 279.0 lb

## 2024-05-15 DIAGNOSIS — N183 Chronic kidney disease, stage 3 unspecified: Secondary | ICD-10-CM

## 2024-05-15 DIAGNOSIS — M1A9XX0 Chronic gout, unspecified, without tophus (tophi): Secondary | ICD-10-CM

## 2024-05-15 DIAGNOSIS — Z905 Acquired absence of kidney: Secondary | ICD-10-CM | POA: Diagnosis not present

## 2024-05-15 DIAGNOSIS — R809 Proteinuria, unspecified: Secondary | ICD-10-CM

## 2024-05-15 DIAGNOSIS — R911 Solitary pulmonary nodule: Secondary | ICD-10-CM | POA: Diagnosis not present

## 2024-05-15 DIAGNOSIS — M545 Low back pain, unspecified: Secondary | ICD-10-CM

## 2024-05-15 DIAGNOSIS — G8929 Other chronic pain: Secondary | ICD-10-CM | POA: Diagnosis not present

## 2024-05-15 DIAGNOSIS — I10 Essential (primary) hypertension: Secondary | ICD-10-CM | POA: Diagnosis not present

## 2024-05-15 DIAGNOSIS — F3342 Major depressive disorder, recurrent, in full remission: Secondary | ICD-10-CM | POA: Diagnosis not present

## 2024-05-15 NOTE — Patient Instructions (Addendum)
 We will call you within two weeks about your referral to pulmonary nodule through [facility].  Their phone number is [phone].  Please call them if you have not heard in 1-2 weeks  We have placed a referral for you today to physical therapy at [facility]- please call their # if you do not hear within a week (may be listed below or you may see mychart message within a few days with #).   Your triglycerides are above goal. This site may help:  [email or web address]  Recommended follow up: Return in about 27 weeks (around 11/20/2024) for physical or sooner if needed.Schedule b4 you leave.

## 2024-05-15 NOTE — Progress Notes (Signed)
 Phone [phone] In person visit   Subjective:   Angie Velasquez is a 78 y.o. year old very pleasant female patient who presents for/with See problem oriented charting Chief Complaint  Patient presents with   Medical Management of Chronic Issues   Hyperlipidemia   Nausea    Pt still c/o nausea   Past Medical History-  Patient Active Problem List   Diagnosis Date Noted   Depression 12/19/2016    Priority: High   S/p nephrectomy 03/12/2015    Priority: High   Anxiety state 12/09/2009    Priority: High   MICROALBUMINURIA 01/23/2008    Priority: High   Fatty liver 06/12/2022    Priority: Medium    CKD (chronic kidney disease), stage III (HCC) 02/14/2019    Priority: Medium    Prediabetes 10/22/2017    Priority: Medium    Hyperglycemia 09/18/2017    Priority: Medium    Essential hypertension 01/04/2016    Priority: Medium    Gout 03/12/2015    Priority: Medium    Hyperlipidemia 03/12/2015    Priority: Medium    COPD (chronic obstructive pulmonary disease) (HCC) 03/12/2015    Priority: Medium    Arthritis     Priority: Medium    Former smoker     Priority: Medium    Vitamin D  deficiency 05/28/2009    Priority: Medium    Hematuria, unspecified 05/26/2009    Priority: Medium    Hearing loss 01/20/2009    Priority: Medium    Osteoarthritis of spine with radiculopathy, cervical region 02/12/2018    Priority: Low   Intertrigo 01/10/2018    Priority: Low   History of osteitis pubis 12/19/2017    Priority: Low   Eczema 06/29/2015    Priority: Low   Tinnitus 03/12/2015    Priority: Low   History of colonic polyps 03/12/2015    Priority: Low   Bimalleolar fracture 07/02/2014    Priority: Low   Arthropathy 05/26/2009    Priority: Low   Insomnia, unspecified 04/27/2009    Priority: Low   DDD (degenerative disc disease), lumbosacral 01/20/2009    Priority: Low   Sialoadenitis 01/20/2009    Priority: Low   GERD 01/23/2008    Priority: Low    Microscopic hematuria 01/23/2008    Priority: Low   UTI (urinary tract infection) 01/08/2008    Priority: Low   Obesity, morbid (HCC) 11/21/2023   Upper airway cough syndrome 08/09/2023   Snoring 01/06/2020   Atypical chest pain 03/26/2017   Contact dermatitis and other eczema, due to unspecified cause 05/26/2009    Medications- reviewed and updated Current Outpatient Medications  Medication Sig Dispense Refill   albuterol  (VENTOLIN  HFA) 108 (90 Base) MCG/ACT inhaler Inhale 2 puffs into the lungs every 6 (six) hours as needed for wheezing or shortness of breath. 18 g 2   allopurinol  (ZYLOPRIM ) 300 MG tablet TAKE 1 TABLET BY MOUTH TWICE A DAY 180 tablet 2   betamethasone  dipropionate 0.05 % cream Apply topically 2 (two) times daily. Apply to left ear for 7 days when itches. 30 g 0   budesonide -formoterol  (SYMBICORT ) 160-4.5 MCG/ACT inhaler Inhale 2 puffs into the lungs 2 (two) times daily. 1 each 1   CALCIUM -MAGNESIUM-ZINC  PO Take by mouth.     Clotrimazole  POWD 1 application  by Does not apply route 2 (two) times daily. Apply after applying Clotrimazole  cream. 25 g 1   clotrimazole -betamethasone  (LOTRISONE ) cream APPLY 1 APPLICATION TOPICALLY 2 (TWO) TIMES DAILY. DRY SKIN WELL FIRST,  THEN APPLY TO RASH UNDER YOUR STOMACH AND GROIN AREAS. 15 g 1   CRANBERRY PO Take by mouth.     diclofenac  sodium (VOLTAREN ) 1 % GEL Apply topically to affected area qid 100 g 2   Ginger, Zingiber officinalis, (GINGER ROOT) 550 MG CAPS Take by mouth.     ipratropium (ATROVENT ) 0.03 % nasal spray Place 2 sprays into both nostrils every 12 (twelve) hours. 30 mL 12   omeprazole  (PRILOSEC ) 20 MG capsule TAKE 1 CAPSULE (20 MG TOTAL) BY MOUTH 2 (TWO) TIMES DAILY BEFORE A MEAL. 180 capsule 3   ondansetron  (ZOFRAN -ODT) 4 MG disintegrating tablet TAKE 1 TABLET BY MOUTH EVERY 8 HOURS AS NEEDED FOR NAUSEA AND VOMITING 180 tablet 3   OVER THE COUNTER MEDICATION Cherry extract bid     OVER THE COUNTER MEDICATION B-12 one  daily     rosuvastatin  (CRESTOR ) 10 MG tablet TAKE 1 TABLET BY MOUTH EVERY DAY 90 tablet 1   sertraline  (ZOLOFT ) 50 MG tablet TAKE 1 TABLET BY MOUTH EVERY DAY 90 tablet 1   Sodium Bicarbonate POWD by Does not apply route.     traMADol  (ULTRAM ) 50 MG tablet Take 1 tablet (50 mg total) by mouth every 6 (six) hours as needed for moderate pain (pain score 4-6) or severe pain (pain score 7-10) (Do not drive for 6 hours after taking). 60 tablet 0   triamcinolone  (NASACORT ) 55 MCG/ACT AERO nasal inhaler Place 1 spray into the nose daily. Start with 1 spray each side twice a day for 3 days, then reduce to daily. 1 each 2   triamcinolone  cream (KENALOG ) 0.1 % Apply 1 application topically 2 (two) times daily. For 7-10 days for recurring eczema issues 80 g 1   TURMERIC PO Take by mouth.     valACYclovir  (VALTREX ) 1000 MG tablet Take 2 pills twice a day for 1 day at first sign of cold sore 28 tablet 1   valsartan  (DIOVAN ) 80 MG tablet Take 1 tablet (80 mg total) by mouth daily. 30 tablet 11   benzonatate  (TESSALON ) 100 MG capsule TAKE 1 CAPSULE BY MOUTH THREE TIMES A DAY AS NEEDED (Patient not taking: Reported on 05/15/2024) 60 capsule 0   clonazePAM  (KLONOPIN ) 1 MG tablet TAKE 1/2 TO 1 TABLET BY MOUTH AT BEDTIME AS NEEDED *DO NOT DRIVE FOR AT LEAST 8 HOURS AFTER TAKING* (Patient not taking: Reported on 05/15/2024) 90 tablet 1   No current facility-administered medications for this visit.     Objective:  BP 118/76   Pulse 80   Temp 98 F (36.7 C)   Ht 5' 7 (1.702 m)   Wt 279 lb (126.6 kg)   SpO2 96%   BMI 43.70 kg/m  Gen: NAD, resting comfortably CV: RRR no murmurs rubs or gallops Lungs: CTAB no crackles, wheeze, rhonchi Ext: trace edema Skin: warm, dry     Assessment and Plan   #social update- has distanced from Al to help reduce her stress levels/reduce cortisol/reduce weight. He was diagnosed with alzheimer's per her report. Has even had to block them. Ex husband dealing with cancer issues  from out of state  # Low back pian- with prolonged walking noting more pain- has to use walker -wants to retry physical therapy at [facility]- referred today  #plans to start with personal training with [facility]- children are gifting this to her after PT  # Solitary 5 mm nodule on 05/14/2023 and right upper lobe-plan for 6 to 3-month follow-up as former smoker -  ordered today  #skin cancer screening- plans to schedule with [facility] - looks like seborrheic keratosis on right cheek but would like for them to double check  #Hyperlipidemia with calcium  on CT coronary artery calcifications S: Medication: Rosuvastatin  10 mg every day  Lab Results  Component Value Date   CHOL 134 11/16/2023   HDL 56.20 11/16/2023   LDLCALC 34 11/16/2023   LDLDIRECT 104.0 04/23/2023   TRIG 221.0 (H) 11/16/2023   CHOLHDL 2 11/16/2023  A/P: reasonably stable/controlled- continue current medications - work on lifestyle for triglyceride(s)    #Unilateral kidney/microalbuminuria/CKD stage III S: medication: valsartan  80 mg -primarily on for renal protective effects with history of microalbuminuria-prior cough with lisinopril   From note from Dr. 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 With plan or 3 month follow up  A/P: I really appreciate Dr. Marene Shape input-we discussed continuing valsartan  but also benefits of weight loss.  She failed Ozempic  trial in the past due to severe nausea vomiting.  She had seen healthy weight to wellness but this was not a good fit for her-we discussed healthy eating/regular activity today she has been to continue her personal efforts  #Gout S: no flares on allopurinol  300 mg twice daily Lab Results  Component Value Date   LABURIC 3.7 04/23/2023  A/P:doing well- consider uric acid next labs   # COPD - refer to pulmonary June 2024 S: radiographic finding initially and initially asymptomatic Maintenance medications: Symbicort  in past-  but does much better now off of lisinopril  - no albuterol  use A/P: doing well- continue current medications- appreciate Dr. Waymond Hailey helping with ongoing cough  #Vitamin D  deficiency S: Medication: Has required high-dose vitamin D  in the past.   Last vitamin D  Lab Results  Component Value Date   VD25OH 39.77 10/18/2022   A/P: plan on recheck vitaminD next visit    #Depression-full remission/anxiety S: meds: sertraline  50 mg - On clonazepam  as needed only Due to anxiety/stress-uses clonazepam -uses pretty regularly for sleep.   A/P: reports full remission- down to half of clonazepam  at night- wants to contiue to cut back as stress levels improve   #fatty liver-we need to work on weight loss-see discussion above  # Prediabetes-A1c elevated but slightly improved-recheck at next physical in 6 months Lab Results  Component Value Date   HGBA1C 6.2 05/14/2024   HGBA1C 6.3 11/16/2023   HGBA1C 6.1 04/23/2023    Recommended follow up: Return in about 27 weeks (around 11/20/2024) for physical or sooner if needed.Schedule b4 you leave. Future Appointments  Date Time Provider Department Center  11/24/2024  1:00 PM Almira Jaeger, MD [facility]    Lab/Order associations:   ICD-10-CM   1. Pulmonary nodule  R91.1 CT Chest Wo Contrast    2. Chronic bilateral low back pain, unspecified whether sciatica present  M54.50 Ambulatory referral to Physical Therapy   G89.29     3. Recurrent major depressive disorder, in full remission (HCC)  F33.42     4. S/p nephrectomy  Z90.5     5. Stage 3 chronic kidney disease, unspecified whether stage 3a or 3b CKD (HCC)  N18.30     6. Essential hypertension  I10     7. Chronic gout without tophus, unspecified cause, unspecified site  M1A.9XX0     8. Proteinuria, unspecified type  R80.9       No orders of the defined types were placed in this encounter.   Return precautions advised.  Clarisa Crooked,  MD

## 2024-05-19 ENCOUNTER — Encounter: Payer: Self-pay | Admitting: Family Medicine

## 2024-05-19 NOTE — Addendum Note (Signed)
 Addended by: Almira Jaeger on: 05/19/2024 05:02 PM   Modules accepted: Orders

## 2024-05-21 ENCOUNTER — Other Ambulatory Visit

## 2024-06-02 ENCOUNTER — Encounter: Payer: Self-pay | Admitting: Family Medicine

## 2024-06-02 NOTE — Telephone Encounter (Signed)
 See below

## 2024-06-03 ENCOUNTER — Telehealth: Payer: Self-pay

## 2024-06-03 NOTE — Telephone Encounter (Signed)
 Order has been refaxed.

## 2024-06-03 NOTE — Telephone Encounter (Signed)
 Order has been refaxed.  Copied from CRM [phone]. Topic: General - Call Back - No Documentation >> Jun 02, 2024  3:57 PM [name] wrote: Reason for CRM: [name] from Imaging, stated that patient is supposed to have a CT Scan done of her chest but they have not received the order for her yet.   [name]'s contact is [phone], Option 5.

## 2024-06-03 NOTE — Telephone Encounter (Signed)
 [name] with [facility] ph# [phone], Option 5 called re: [facility] has not received Referral for CT Scan.

## 2024-06-03 NOTE — Telephone Encounter (Signed)
 Order has been re faxed.  Copied from CRM [id number]. Topic: General - Other >> Jun 02, 2024  8:52 AM Thersia BROCKS wrote: Reason for CRM: [facility] called regarding patient CT Chest Wo Contrast , stated they have not received the orders and need them faxed  [phone] >> Jun 02, 2024  2:01 PM Franky GRADE wrote: [facility] is calling to follow up on this request, they are unable to schedule the patient without the order. Fax number [phone].

## 2024-06-11 DIAGNOSIS — K449 Diaphragmatic hernia without obstruction or gangrene: Secondary | ICD-10-CM | POA: Diagnosis not present

## 2024-06-11 DIAGNOSIS — I2510 Atherosclerotic heart disease of native coronary artery without angina pectoris: Secondary | ICD-10-CM | POA: Diagnosis not present

## 2024-06-11 DIAGNOSIS — R918 Other nonspecific abnormal finding of lung field: Secondary | ICD-10-CM | POA: Diagnosis not present

## 2024-06-12 ENCOUNTER — Encounter: Payer: Self-pay | Admitting: Family Medicine

## 2024-06-12 ENCOUNTER — Ambulatory Visit: Payer: Self-pay | Admitting: Family Medicine

## 2024-06-13 NOTE — Telephone Encounter (Signed)
 Please see pt msg regarding any other findings?

## 2024-06-13 NOTE — Telephone Encounter (Signed)
 Please see pt msg and advise recommendations

## 2024-06-18 ENCOUNTER — Other Ambulatory Visit: Payer: Self-pay

## 2024-06-18 ENCOUNTER — Encounter: Payer: Self-pay | Admitting: Family Medicine

## 2024-06-18 ENCOUNTER — Telehealth: Payer: Self-pay

## 2024-06-18 ENCOUNTER — Other Ambulatory Visit (HOSPITAL_COMMUNITY): Payer: Self-pay

## 2024-06-18 ENCOUNTER — Other Ambulatory Visit: Payer: Self-pay | Admitting: Family Medicine

## 2024-06-18 ENCOUNTER — Other Ambulatory Visit: Payer: Self-pay | Admitting: Internal Medicine

## 2024-06-18 DIAGNOSIS — J441 Chronic obstructive pulmonary disease with (acute) exacerbation: Secondary | ICD-10-CM

## 2024-06-18 MED ORDER — ONDANSETRON 4 MG PO TBDP
ORAL_TABLET | ORAL | 3 refills | Status: AC
  Filled 2025-01-07: qty 180, 60d supply, fill #0

## 2024-06-18 NOTE — Telephone Encounter (Signed)
 Pharmacy Patient Advocate Encounter  Received notification from [facility] [employer] that Prior Authorization for ONDANSETRON  4MG  TABS has been DENIED.  Full denial letter will be uploaded to the media tab. See denial reason below.   PA #/Case ID/Reference #:  [id number]

## 2024-06-18 NOTE — Telephone Encounter (Signed)
 See below

## 2024-06-18 NOTE — Telephone Encounter (Signed)
 Pharmacy Patient Advocate Encounter   Received notification from Patient Advice Request messages that prior authorization for Ondansetron  4MG  dispersible tablets is required/requested.   Insurance verification completed.   The patient is insured through [facility] ADVANTAGE/RX ADVANCE .   Per test claim: PA required; PA submitted to above mentioned insurance via CoverMyMeds Key/confirmation #/EOC [id number] Status is pending

## 2024-06-18 NOTE — Telephone Encounter (Signed)
 Can you guys look into this please?

## 2024-06-25 ENCOUNTER — Other Ambulatory Visit: Payer: Self-pay

## 2024-06-25 ENCOUNTER — Encounter: Payer: Self-pay | Admitting: Family Medicine

## 2024-06-25 MED ORDER — VALSARTAN 80 MG PO TABS
80.0000 mg | ORAL_TABLET | Freq: Every day | ORAL | 11 refills | Status: AC
  Filled 2025-01-07: qty 30, 30d supply, fill #0

## 2024-06-25 MED ORDER — TRAMADOL HCL 50 MG PO TABS: 50.0000 mg | ORAL_TABLET | Freq: Four times a day (QID) | ORAL | 0 refills | Status: AC | PRN

## 2024-06-27 ENCOUNTER — Encounter: Payer: Self-pay | Admitting: Family Medicine

## 2024-06-27 NOTE — Telephone Encounter (Signed)
 Please review patient request

## 2024-07-02 MED ORDER — DIAZEPAM 10 MG PO TABS: 5.0000 mg | ORAL_TABLET | Freq: Two times a day (BID) | ORAL | 0 refills | Status: AC | PRN

## 2024-07-10 ENCOUNTER — Ambulatory Visit (HOSPITAL_BASED_OUTPATIENT_CLINIC_OR_DEPARTMENT_OTHER): Attending: Family Medicine | Admitting: Physical Therapy

## 2024-07-10 ENCOUNTER — Other Ambulatory Visit: Payer: Self-pay

## 2024-07-10 DIAGNOSIS — M6281 Muscle weakness (generalized): Secondary | ICD-10-CM | POA: Diagnosis not present

## 2024-07-10 DIAGNOSIS — M545 Low back pain, unspecified: Secondary | ICD-10-CM | POA: Insufficient documentation

## 2024-07-10 DIAGNOSIS — G8929 Other chronic pain: Secondary | ICD-10-CM | POA: Diagnosis not present

## 2024-07-10 DIAGNOSIS — M79604 Pain in right leg: Secondary | ICD-10-CM | POA: Insufficient documentation

## 2024-07-10 DIAGNOSIS — M5459 Other low back pain: Secondary | ICD-10-CM | POA: Insufficient documentation

## 2024-07-10 NOTE — Therapy (Signed)
 OUTPATIENT PHYSICAL THERAPY THORACOLUMBAR EVALUATION   Patient Name: Angie Velasquez MRN: [id number] DOB:[birth year], 78 y.o., female Today's Date: 07/11/2024  END OF SESSION:  PT End of Session - 07/10/24 1326     Visit Number 1    Number of Visits 8    Date for PT Re-Evaluation 08/21/24    Authorization Type HTA    PT Start Time 1324    PT Stop Time 1408    PT Time Calculation (min) 44 min    Activity Tolerance Patient tolerated treatment well    Behavior During Therapy WFL for tasks assessed/performed          Past Medical History:  Diagnosis Date   Anxiety    years ago - no longer an issue   Arthritis    gout   Cataracts, bilateral    Complication of anesthesia    Fracture of pelvis (HCC)    GERD (gastroesophageal reflux disease)    Hx of chest pain    anxiety   Hyperlipidemia    INSOMNIA 10/14/2008   in past   Joint pain    Kidney problem    Low blood sugar    eats several smaller meals a day   Obesity    Palpitations    Stress related    Proteinuria    H/O   Tobacco abuse    quit smoking 08/2013   Past Surgical History:  Procedure Laterality Date   ABDOMINAL HYSTERECTOMY     APPENDECTOMY     with hysterectomy   childbirth     x4   CHOLECYSTECTOMY  2001   EYE SURGERY Bilateral 2015   cataract surgery with lens implant   NEPHRECTOMY  1992   complex cystic mass - nephrectomy right   ORIF ANKLE FRACTURE Right 07/02/2014   Procedure: OPEN REDUCTION INTERNAL FIXATION (ORIF) RIGHT ANKLE BIMALLOELAR FRACTURE;  Surgeon: Norleen Armor, MD;  Location: [facility] OR;  Service: Orthopedics;  Laterality: Right;   OTHER SURGICAL HISTORY     hysterectomy, R ovary remains   SHOULDER SURGERY     bone spurs left   Patient Active Problem List   Diagnosis Date Noted   Obesity, morbid (HCC) 11/21/2023   Upper airway cough syndrome 08/09/2023   Fatty liver 06/12/2022   Snoring 01/06/2020   CKD (chronic kidney disease), stage III (HCC) 02/14/2019    Osteoarthritis of spine with radiculopathy, cervical region 02/12/2018   Intertrigo 01/10/2018   History of osteitis pubis 12/19/2017   Prediabetes 10/22/2017   Hyperglycemia 09/18/2017   Atypical chest pain 03/26/2017   Depression 12/19/2016   Essential hypertension 01/04/2016   Eczema 06/29/2015   Gout 03/12/2015   S/p nephrectomy 03/12/2015   Hyperlipidemia 03/12/2015   Tinnitus 03/12/2015   COPD (chronic obstructive pulmonary disease) (HCC) 03/12/2015   History of colonic polyps 03/12/2015   Bimalleolar fracture 07/02/2014   Arthritis    Former smoker    Anxiety state 12/09/2009   Vitamin D  deficiency 05/28/2009   Hematuria, unspecified 05/26/2009   Contact dermatitis and other eczema, due to unspecified cause 05/26/2009   Arthropathy 05/26/2009   Insomnia, unspecified 04/27/2009   DDD (degenerative disc disease), lumbosacral 01/20/2009   Hearing loss 01/20/2009   Sialoadenitis 01/20/2009   GERD 01/23/2008   MICROALBUMINURIA 01/23/2008   Microscopic hematuria 01/23/2008   UTI (urinary tract infection) 01/08/2008    PCP: Katrinka Garnette KIDD, MD   REFERRING PROVIDER: Katrinka Garnette KIDD, MD   REFERRING DIAG: M54.50,G89.29 (ICD-10-CM) - Chronic bilateral low back  pain, unspecified whether sciatica present   Rationale for Evaluation and Treatment: Rehabilitation  THERAPY DIAG:  Other low back pain  Muscle weakness (generalized)  Pain in right leg  ONSET DATE: approx 1 year ago  SUBJECTIVE:                                                                                                                                                                                           SUBJECTIVE STATEMENT: Pt states her pain began approx 1 year ago with no specific MOI. Pt was seen in PT in 2023 for osteitis pubis and received dry needling.  Pt responded well to PT and stopped using her walker after PT except for longer distance.  Pt sometimes performs her HEP. Pt has been  receiving personal training 2x/wk at [facility] for approx 3 weeks.  Her back starts hurting with standing activities, household chores, and walking and she has to sit down.  Pt has fatigue and pain with performing stairs.  She avoids stairs.  Pt states she has pain that travels to R hip and also in R LE.  Pt does seated Hip ER stretch which helps. Pt lies down in the middle of the day and rests which helps.    PERTINENT HISTORY:  Trace anterolisthesis of L4 on L5 per CT Pt states she has a R sided hernia. OA, gout ORIF for R ankle bimalleolar Fx 2015  CKD, stage 3 s/p Nephrectomy in 1992 Obesity, depression, L shoulder surgery 5 mm nodule on 05/14/2023 and right upper lobe   PAIN:  NPRS:  0/10 current, >10/10 worst   Location:  central lumbar and bilat lumbar flanks.  It can travel to R LE  PRECAUTIONS:  Trace anterolisthesis of L4 on L5.   WEIGHT BEARING RESTRICTIONS: No  FALLS:  Has patient fallen in last 6 months? No  LIVING ENVIRONMENT: Lives with: lives alone Lives in: 1 story home Stairs: 2 steps to enter home with rails Has following equipment at home:   OCCUPATION: Pt is retired  PLOF: Independent  Pt has someone who comes once per month to help clean home.  PATIENT GOALS: to eliminate pain, to find exercises to help   OBJECTIVE:  Note: Objective measures were completed at Evaluation unless otherwise noted.  DIAGNOSTIC FINDINGS:  CT abdomen/pelvis 12/24: Musculoskeletal: Curvature of the spine. Scattered degenerative changes seen along the spine and pelvis. Trace anterolisthesis of L4 on L5. Multilevel stenosis along the lower lumbar spine.  PATIENT SURVEYS:  Modified Oswestry:  MODIFIED OSWESTRY DISABILITY SCALE  Date: 07/10/2024 Score  Pain intensity 1 = The pain is bad, but I can manage  without having to take (1) I can stand as long as I want but, it increases my pain. pain medication.  2. Personal care (washing, dressing, etc.) 0 =  I can take care of  myself normally without causing increased pain.  3. Lifting 2 = Pain prevents me from lifting heavy weights off the floor, activities (eg. sports, dancing). but I can manage if the weights are conveniently positioned (3) Pain prevents me from going out very often. (eg, on a table).  4. Walking 1 = Pain prevents me from walking more than 1 mile.  5. Sitting 1 =  I can only sit in my favorite chair as long as I like.  6. Standing 4 =  Pain prevents me from standing more than 10 minutes.  7. Sleeping 0 = Pain does not prevent me from sleeping well.  8. Social Life 0 = My social life is normal and does not increase my pain.  9. Traveling 1 =  I can travel anywhere, but it increases my pain.  10. Employment/ Homemaking 0 = My normal homemaking/job activities do not cause pain.  Total 10/50  = 20%   Interpretation of scores: Score Category Description  0-20% Minimal Disability The patient can cope with most living activities. Usually no treatment is indicated apart from advice on lifting, sitting and exercise  21-40% Moderate Disability The patient experiences more pain and difficulty with sitting, lifting and standing. Travel and social life are more difficult and they may be disabled from work. Personal care, sexual activity and sleeping are not grossly affected, and the patient can usually be managed by conservative means  41-60% Severe Disability Pain remains the main problem in this group, but activities of daily living are affected. These patients require a detailed investigation  61-80% Crippled Back pain impinges on all aspects of the patient's life. Positive intervention is required  81-100% Bed-bound  These patients are either bed-bound or exaggerating their symptoms  Bluford FORBES Zoe DELENA Karon DELENA, et al. Surgery versus conservative management of stable thoracolumbar fracture: the PRESTO feasibility RCT. [place]): [employer]; 2021 Nov. [facility] Technology Assessment, No.  25.62.) Appendix 3, Oswestry Disability Index category descriptors. Available from: FindJewelers.cz  Minimally Clinically Important Difference (MCID) = 12.8%  COGNITION: Overall cognitive status: Within functional limits for tasks assessed       MUSCLE LENGTH: Hamstrings: tightness in bilat HS, L > R  PALPATION: TTP in bilat lateral lumbar flanks  LUMBAR ROM:   AROM eval  Flexion WFL  Extension   Right lateral flexion WFL  Left lateral flexion WFL  Right rotation WFL  Left rotation WFL   (Blank rows = not tested)  LOWER EXTREMITY MMT:    MMT Right eval Left eval  Hip flexion 5/5 4/5  Hip extension    Hip abduction < 3/5 3+/5  Hip adduction    Hip internal rotation    Hip external rotation 5/5 4/5  Knee flexion 5/5 Seated 5/5 seated  Knee extension 5/5 5/5  Ankle dorsiflexion 5/5 5/5  Ankle plantarflexion WFL seated WFL seated  Ankle inversion    Ankle eversion     (Blank rows = not tested)  LUMBAR SPECIAL TESTS:  SLR test:  negative bilat   GAIT: Assistive device utilized: None Level of assistance: Complete Independence Comments: limited reciprocal arm swing and pelvic rotation  TREATMENT:  See below for pt education.   PATIENT EDUCATION:  Education details: dx, POC, rationale of interventions, relevant anatomy, prognosis, and objective findings. PT answered pt's questions.  Person educated: Patient Education method: Explanation Education comprehension: verbalized understanding and needs further education  HOME EXERCISE PROGRAM: Will give next visit  ASSESSMENT:  CLINICAL IMPRESSION: Patient is a 78 y.o. female with a dx of chronic bilat LBP.  Pt has pain in bilat sides of lumbar though states it can travel to R hip and R LE.  She has increased pain with with standing activities, household  chores, and ambulation.  Pt avoids stairs due to fatigue and pain.  Pt has good lumbar AROM.  Pt has weakness in bilat hip abd, L hip flex, and L hip ER.  Pt has started seeing a personal trainer 2x/wk.  She should benefit from skilled PT to address impairments and improve overall function.      OBJECTIVE IMPAIRMENTS: decreased activity tolerance, decreased endurance, decreased mobility, difficulty walking, decreased strength, increased fascial restrictions, impaired flexibility, and pain.   ACTIVITY LIMITATIONS: standing, stairs, and locomotion level  PARTICIPATION LIMITATIONS: cleaning, shopping, and community activity  PERSONAL FACTORS: 3+ comorbidities: OA, obesity, R ankle ORIF, R sided hernia, CKD with nephrectomy are also affecting patient's functional outcome.   REHAB POTENTIAL: Good  CLINICAL DECISION MAKING: Stable/uncomplicated  EVALUATION COMPLEXITY: Low   GOALS:   SHORT TERM GOALS: Target date:  07/31/24   Pt will be independent and compliant with HEP for improved pain, strength, and function.  Baseline: Goal status: INITIAL  2.  Pt will report improved tolerance with standing in order to perform increased household chores.  Baseline:  Goal status: INITIAL  3.  Pt will report at least a 25% improvement in pain and sx's overall.  Baseline:  Goal status: INITIAL   LONG TERM GOALS: Target date: 08/21/2024   Pt will demo improved LE strength to at least 4/5 in R hip abd and 4+/5 in L hip abd and 5/5 in L hip flex and ER for improved performance of and tolerance with functional mobility and household chores.  Baseline:  Goal status: INITIAL  2.  Pt will report she is able to perform her normal household chores without significant pain and difficulty.   Baseline:  Goal status: INITIAL  3.  Pt will be able to ambulate extended community distance without significant pain and difficulty.  Baseline:  Goal status: INITIAL    PLAN:  PT FREQUENCY: 1-2x/week  PT  DURATION: 6 weeks  PLANNED INTERVENTIONS: 97164- PT Re-evaluation, 97750- Physical Performance Testing, 97110-Therapeutic exercises, 97530- Therapeutic activity, [id number]- Neuromuscular re-education, 97535- Self Care, 02859- Manual therapy, [phone]- Gait training, [phone]- Aquatic Therapy, Patient/Family education, Stair training, Taping, Joint mobilization, Spinal mobilization, Cryotherapy, and Moist heat.  PLAN FOR NEXT SESSION: HS flexibility.  STM to lumbar paraspinals.  Core and LE strengthening.  Review her gym program.  Perform PPT.   Leigh Minerva III PT, DPT 07/11/24 4:20 PM

## 2024-07-11 ENCOUNTER — Encounter (HOSPITAL_BASED_OUTPATIENT_CLINIC_OR_DEPARTMENT_OTHER): Payer: Self-pay | Admitting: Physical Therapy

## 2024-07-14 ENCOUNTER — Encounter: Payer: Self-pay | Admitting: Family Medicine

## 2024-07-15 ENCOUNTER — Other Ambulatory Visit: Payer: Self-pay | Admitting: Family Medicine

## 2024-07-15 NOTE — Telephone Encounter (Signed)
 Okay to refer to healthy weight and wellness?

## 2024-07-16 DIAGNOSIS — E669 Obesity, unspecified: Secondary | ICD-10-CM | POA: Diagnosis not present

## 2024-07-16 DIAGNOSIS — E785 Hyperlipidemia, unspecified: Secondary | ICD-10-CM | POA: Diagnosis not present

## 2024-07-16 DIAGNOSIS — K219 Gastro-esophageal reflux disease without esophagitis: Secondary | ICD-10-CM | POA: Diagnosis not present

## 2024-07-16 DIAGNOSIS — I129 Hypertensive chronic kidney disease with stage 1 through stage 4 chronic kidney disease, or unspecified chronic kidney disease: Secondary | ICD-10-CM | POA: Diagnosis not present

## 2024-07-16 DIAGNOSIS — N1831 Chronic kidney disease, stage 3a: Secondary | ICD-10-CM | POA: Diagnosis not present

## 2024-07-16 DIAGNOSIS — J449 Chronic obstructive pulmonary disease, unspecified: Secondary | ICD-10-CM | POA: Diagnosis not present

## 2024-07-17 LAB — LAB REPORT - SCANNED
Albumin, Urine POC: 797.2
Albumin/Creatinine Ratio, Urine, POC: 1069
Creatinine, POC: 74.6 mg/dL
EGFR: 45

## 2024-07-21 ENCOUNTER — Encounter (INDEPENDENT_AMBULATORY_CARE_PROVIDER_SITE_OTHER): Payer: Self-pay

## 2024-07-25 ENCOUNTER — Encounter (HOSPITAL_BASED_OUTPATIENT_CLINIC_OR_DEPARTMENT_OTHER): Payer: Self-pay | Admitting: Physical Therapy

## 2024-07-25 ENCOUNTER — Ambulatory Visit (HOSPITAL_BASED_OUTPATIENT_CLINIC_OR_DEPARTMENT_OTHER): Admitting: Physical Therapy

## 2024-07-25 DIAGNOSIS — M5459 Other low back pain: Secondary | ICD-10-CM | POA: Diagnosis not present

## 2024-07-25 DIAGNOSIS — M79604 Pain in right leg: Secondary | ICD-10-CM

## 2024-07-25 DIAGNOSIS — M6281 Muscle weakness (generalized): Secondary | ICD-10-CM

## 2024-07-25 NOTE — Therapy (Signed)
 OUTPATIENT PHYSICAL THERAPY THORACOLUMBAR TREATMENT    Patient Name: Angie Velasquez MRN: [id number] DOB:12-13-1945, 78 y.o., female Today's Date: 07/25/2024  END OF SESSION:  PT End of Session - 07/25/24 1213     Visit Number 2    Number of Visits 8    Date for PT Re-Evaluation 08/21/24    Authorization Type HTA    Progress Note Due on Visit 10    PT Start Time 1155   kept moving away from therapy waiting area, PT unable to find her   PT Stop Time 1227    PT Time Calculation (min) 32 min    Activity Tolerance Patient tolerated treatment well    Behavior During Therapy [facility] for tasks assessed/performed           Past Medical History:  Diagnosis Date   Anxiety    years ago - no longer an issue   Arthritis    gout   Cataracts, bilateral    Complication of anesthesia    Fracture of pelvis (HCC)    GERD (gastroesophageal reflux disease)    Hx of chest pain    anxiety   Hyperlipidemia    INSOMNIA 10/14/2008   in past   Joint pain    Kidney problem    Low blood sugar    eats several smaller meals a day   Obesity    Palpitations    Stress related    Proteinuria    H/O   Tobacco abuse    quit smoking 08/2013   Past Surgical History:  Procedure Laterality Date   ABDOMINAL HYSTERECTOMY     APPENDECTOMY     with hysterectomy   childbirth     x4   CHOLECYSTECTOMY  2001   EYE SURGERY Bilateral 2015   cataract surgery with lens implant   NEPHRECTOMY  1992   complex cystic mass - nephrectomy right   ORIF ANKLE FRACTURE Right 07/02/2014   Procedure: OPEN REDUCTION INTERNAL FIXATION (ORIF) RIGHT ANKLE BIMALLOELAR FRACTURE;  Surgeon: Norleen Armor, MD;  Location: [facility] OR;  Service: Orthopedics;  Laterality: Right;   OTHER SURGICAL HISTORY     hysterectomy, R ovary remains   SHOULDER SURGERY     bone spurs left   Patient Active Problem List   Diagnosis Date Noted   Obesity, morbid (HCC) 11/21/2023   Upper airway cough syndrome 08/09/2023   Fatty liver  06/12/2022   Snoring 01/06/2020   CKD (chronic kidney disease), stage III (HCC) 02/14/2019   Osteoarthritis of spine with radiculopathy, cervical region 02/12/2018   Intertrigo 01/10/2018   History of osteitis pubis 12/19/2017   Prediabetes 10/22/2017   Hyperglycemia 09/18/2017   Atypical chest pain 03/26/2017   Depression 12/19/2016   Essential hypertension 01/04/2016   Eczema 06/29/2015   Gout 03/12/2015   S/p nephrectomy 03/12/2015   Hyperlipidemia 03/12/2015   Tinnitus 03/12/2015   COPD (chronic obstructive pulmonary disease) (HCC) 03/12/2015   History of colonic polyps 03/12/2015   Bimalleolar fracture 07/02/2014   Arthritis    Former smoker    Anxiety state 12/09/2009   Vitamin D  deficiency 05/28/2009   Hematuria, unspecified 05/26/2009   Contact dermatitis and other eczema, due to unspecified cause 05/26/2009   Arthropathy 05/26/2009   Insomnia, unspecified 04/27/2009   DDD (degenerative disc disease), lumbosacral 01/20/2009   Hearing loss 01/20/2009   Sialoadenitis 01/20/2009   GERD 01/23/2008   MICROALBUMINURIA 01/23/2008   Microscopic hematuria 01/23/2008   UTI (urinary tract infection) 01/08/2008  PCP: Katrinka Garnette KIDD, MD   REFERRING PROVIDER: Katrinka Garnette KIDD, MD   REFERRING DIAG: 5202002057 (ICD-10-CM) - Chronic bilateral low back pain, unspecified whether sciatica present   Rationale for Evaluation and Treatment: Rehabilitation  THERAPY DIAG:  Other low back pain  Muscle weakness (generalized)  Pain in right leg  ONSET DATE: approx 1 year ago  SUBJECTIVE:                                                                                                                                                                                           SUBJECTIVE STATEMENT:  I have this pain in my back, Mose said we would work on my core. When I've been up for awhile pain can get to 10/10. I think I get adhesions on both sides of my trunk, it doesn't  always happen but it can happen and it feels like the grip of death when it does happen      EVAL: Pt states her pain began approx 1 year ago with no specific MOI. Pt was seen in PT in 2023 for osteitis pubis and received dry needling.  Pt responded well to PT and stopped using her walker after PT except for longer distance.  Pt sometimes performs her HEP. Pt has been receiving personal training 2x/wk at [facility] for approx 3 weeks.  Her back starts hurting with standing activities, household chores, and walking and she has to sit down.  Pt has fatigue and pain with performing stairs.  She avoids stairs.  Pt states she has pain that travels to R hip and also in R LE.  Pt does seated Hip ER stretch which helps. Pt lies down in the middle of the day and rests which helps.    PERTINENT HISTORY:  Trace anterolisthesis of L4 on L5 per CT Pt states she has a R sided hernia. OA, gout ORIF for R ankle bimalleolar Fx 2015  CKD, stage 3 s/p Nephrectomy in 1992 Obesity, depression, L shoulder surgery 5 mm nodule on 05/14/2023 and right upper lobe   PAIN:  NPRS:  2/10 current, >10/10 worst   Location:  central lumbar and bilat lumbar flanks.  It can travel to R LE sometimes but not recently Aching pain- standing too long makes it worse, getting off feet makes it better   PRECAUTIONS:  Trace anterolisthesis of L4 on L5.   WEIGHT BEARING RESTRICTIONS: No  FALLS:  Has patient fallen in last 6 months? No  LIVING ENVIRONMENT: Lives with: lives alone Lives in: 1 story home Stairs: 2 steps to enter home with rails Has following equipment at home:  OCCUPATION: Pt is retired  PLOF: Independent  Pt has someone who comes once per month to help clean home.  PATIENT GOALS: to eliminate pain, to find exercises to help   OBJECTIVE:  Note: Objective measures were completed at Evaluation unless otherwise noted.  DIAGNOSTIC FINDINGS:  CT abdomen/pelvis 12/24: Musculoskeletal: Curvature of the  spine. Scattered degenerative changes seen along the spine and pelvis. Trace anterolisthesis of L4 on L5. Multilevel stenosis along the lower lumbar spine.  PATIENT SURVEYS:  Modified Oswestry:  MODIFIED OSWESTRY DISABILITY SCALE  Date: 07/10/2024 Score  Pain intensity 1 = The pain is bad, but I can manage without having to take (1) I can stand as long as I want but, it increases my pain. pain medication.  2. Personal care (washing, dressing, etc.) 0 =  I can take care of myself normally without causing increased pain.  3. Lifting 2 = Pain prevents me from lifting heavy weights off the floor, activities (eg. sports, dancing). but I can manage if the weights are conveniently positioned (3) Pain prevents me from going out very often. (eg, on a table).  4. Walking 1 = Pain prevents me from walking more than 1 mile.  5. Sitting 1 =  I can only sit in my favorite chair as long as I like.  6. Standing 4 =  Pain prevents me from standing more than 10 minutes.  7. Sleeping 0 = Pain does not prevent me from sleeping well.  8. Social Life 0 = My social life is normal and does not increase my pain.  9. Traveling 1 =  I can travel anywhere, but it increases my pain.  10. Employment/ Homemaking 0 = My normal homemaking/job activities do not cause pain.  Total 10/50  = 20%   Interpretation of scores: Score Category Description  0-20% Minimal Disability The patient can cope with most living activities. Usually no treatment is indicated apart from advice on lifting, sitting and exercise  21-40% Moderate Disability The patient experiences more pain and difficulty with sitting, lifting and standing. Travel and social life are more difficult and they may be disabled from work. Personal care, sexual activity and sleeping are not grossly affected, and the patient can usually be managed by conservative means  41-60% Severe Disability Pain remains the main problem in this group, but activities of daily living are  affected. These patients require a detailed investigation  61-80% Crippled Back pain impinges on all aspects of the patient's life. Positive intervention is required  81-100% Bed-bound  These patients are either bed-bound or exaggerating their symptoms  Bluford FORBES Zoe DELENA Karon DELENA, et al. Surgery versus conservative management of stable thoracolumbar fracture: the PRESTO feasibility RCT. [place] ([place]): [employer]; 2021 Nov. [facility] Technology Assessment, No. 25.62.) Appendix 3, Oswestry Disability Index category descriptors. Available from: FindJewel[employer].cz  Minimally Clinically Important Difference (MCID) = 12.8%  COGNITION: Overall cognitive status: Within functional limits for tasks assessed       MUSCLE LENGTH: Hamstrings: tightness in bilat HS, L > R  PALPATION: TTP in bilat lateral lumbar flanks  LUMBAR ROM:   AROM eval  Flexion WFL  Extension   Right lateral flexion WFL  Left lateral flexion WFL  Right rotation WFL  Left rotation WFL   (Blank rows = not tested)  LOWER EXTREMITY MMT:    MMT Right eval Left eval  Hip flexion 5/5 4/5  Hip extension    Hip abduction < 3/5 3+/5  Hip adduction  Hip internal rotation    Hip external rotation 5/5 4/5  Knee flexion 5/5 Seated 5/5 seated  Knee extension 5/5 5/5  Ankle dorsiflexion 5/5 5/5  Ankle plantarflexion WFL seated WFL seated  Ankle inversion    Ankle eversion     (Blank rows = not tested)  LUMBAR SPECIAL TESTS:  SLR test:  negative bilat   GAIT: Assistive device utilized: None Level of assistance: Complete Independence Comments: limited reciprocal arm swing and pelvic rotation  TREATMENT:                                                                                                                                07/25/24  Kallie x12 PPT 15x3 seconds PPT + march x5 Figure 4 stretch 2x30 seconds B Sidelying clams red TB x12 B Bridge + ABD into red TB  x12 Seated QL stretch with stool 3x30 seconds forward only  Seated scapular retraction 15x3 seconds  Backward shoulder rolls x20    07/10/24  Eval     PATIENT EDUCATION:  Education details: dx, POC, rationale of interventions, relevant anatomy, prognosis, and objective findings. PT answered pt's questions.  Person educated: Patient Education method: Explanation Education comprehension: verbalized understanding and needs further education  HOME EXERCISE PROGRAM:  Access Code: [email or web address] URL: [email or web address] Date: 07/25/2024 Prepared by: Josette Rough  Exercises - Supine Bridge  - 1 x daily - 7 x weekly - 2 sets - 10 reps - 2 seconds  hold - Supine Posterior Pelvic Tilt  - 1 x daily - 7 x weekly - 2 sets - 10 reps - 3 seconds  hold - Supine March with Posterior Pelvic Tilt  - 1 x daily - 7 x weekly - 2 sets - 5 reps - Supine Figure 4 Piriformis Stretch  - 1 x daily - 7 x weekly - 2 sets - 4 reps - 30 seconds hold     ASSESSMENT:  CLINICAL IMPRESSION:  Arrives today doing OK- session got started late as patient kept walking away from waiting area and therapist was unable to find her. Worked on hip and core strengthening as per POC, also assigned initial HEP. Will continue to challenge her as appropriate.     EVAL: Patient is a 79 y.o. female with a dx of chronic bilat LBP.  Pt has pain in bilat sides of lumbar though states it can travel to R hip and R LE.  She has increased pain with with standing activities, household chores, and ambulation.  Pt avoids stairs due to fatigue and pain.  Pt has good lumbar AROM.  Pt has weakness in bilat hip abd, L hip flex, and L hip ER.  Pt has started seeing a personal trainer 2x/wk.  She should benefit from skilled PT to address impairments and improve overall function.      OBJECTIVE IMPAIRMENTS: decreased activity tolerance, decreased endurance, decreased mobility, difficulty walking, decreased strength, increased  fascial restrictions, impaired flexibility,  and pain.   ACTIVITY LIMITATIONS: standing, stairs, and locomotion level  PARTICIPATION LIMITATIONS: cleaning, shopping, and community activity  PERSONAL FACTORS: 3+ comorbidities: OA, obesity, R ankle ORIF, R sided hernia, CKD with nephrectomy are also affecting patient's functional outcome.   REHAB POTENTIAL: Good  CLINICAL DECISION MAKING: Stable/uncomplicated  EVALUATION COMPLEXITY: Low   GOALS:   SHORT TERM GOALS: Target date:  07/31/24   Pt will be independent and compliant with HEP for improved pain, strength, and function.  Baseline: Goal status: ONGOING 07/25/24 initial assigned today   2.  Pt will report improved tolerance with standing in order to perform increased household chores.  Baseline:  Goal status: INITIAL  3.  Pt will report at least a 25% improvement in pain and sx's overall.  Baseline:  Goal status: INITIAL   LONG TERM GOALS: Target date: 08/21/2024   Pt will demo improved LE strength to at least 4/5 in R hip abd and 4+/5 in L hip abd and 5/5 in L hip flex and ER for improved performance of and tolerance with functional mobility and household chores.  Baseline:  Goal status: INITIAL  2.  Pt will report she is able to perform her normal household chores without significant pain and difficulty.   Baseline:  Goal status: INITIAL  3.  Pt will be able to ambulate extended community distance without significant pain and difficulty.  Baseline:  Goal status: INITIAL    PLAN:  PT FREQUENCY: 1-2x/week  PT DURATION: 6 weeks  PLANNED INTERVENTIONS: 97164- PT Re-evaluation, 97750- Physical Performance Testing, 97110-Therapeutic exercises, 97530- Therapeutic activity, [id number]- Neuromuscular re-education, 97535- Self Care, [id number]- Manual therapy, [phone]- Gait training, [phone]- Aquatic Therapy, Patient/Family education, Stair training, Taping, Joint mobilization, Spinal mobilization, Cryotherapy, and Moist  heat.  PLAN FOR NEXT SESSION: HS flexibility.  STM to lumbar paraspinals.  Core and LE strengthening PRE, incorporate postural training/strengthening.  Review her gym program.     Josette Rough, PT, DPT 07/25/24 12:28 PM

## 2024-07-30 ENCOUNTER — Ambulatory Visit (INDEPENDENT_AMBULATORY_CARE_PROVIDER_SITE_OTHER)

## 2024-07-30 VITALS — Ht 67.0 in | Wt 279.0 lb

## 2024-07-30 DIAGNOSIS — Z0000 Encounter for general adult medical examination without abnormal findings: Secondary | ICD-10-CM | POA: Diagnosis not present

## 2024-07-30 NOTE — Progress Notes (Addendum)
 Subjective:   Angie Velasquez is a 78 y.o. who presents for a Medicare Wellness preventive visit.  As a reminder, Annual Wellness Visits don't include a physical exam, and some assessments may be limited, especially if this visit is performed virtually. We may recommend an in-person follow-up visit with your provider if needed.  Visit Complete: Virtual I connected with  Angie Velasquez on 07/30/24 by a audio enabled telemedicine application and verified that I am speaking with the correct person using two identifiers.  Patient Location: Home  Provider Location: Home Office  I discussed the limitations of evaluation and management by telemedicine. The patient expressed understanding and agreed to proceed.  Vital Signs: Because this visit was a virtual/telehealth visit, some criteria may be missing or patient reported. Any vitals not documented were not able to be obtained and vitals that have been documented are patient reported.  VideoDeclined- This patient declined Librarian, academic. Therefore the visit was completed with audio only.  Persons Participating in Visit: Patient.  AWV Questionnaire: Yes: Patient Medicare AWV questionnaire was completed by the patient on 07/29/24; I have confirmed that all information answered by patient is correct and no changes since this date.  Cardiac Risk Factors include: advanced age (>25men, >16 women);hypertension;dyslipidemia;obesity (BMI >30kg/m2)     Objective:    Today's Vitals   07/30/24 1417  Weight: 279 lb (126.6 kg)  Height: 5' 7 (1.702 m)   Body mass index is 43.7 kg/m.     07/30/2024    2:22 PM 07/10/2024    1:46 PM 06/21/2023    1:16 PM 05/14/2023   11:50 AM 08/14/2022    9:04 AM 06/02/2022    1:08 PM 01/03/2018    1:06 PM  Advanced Directives  Does Patient Have a Medical Advance Directive? Yes Yes Yes No No Yes Yes   Type of Estate agent of  [place];Living will Healthcare Power of [place];Living will Healthcare Power of [place];Living will   Healthcare Power of Attorney   Does patient want to make changes to medical advance directive?       No - Patient declined   Copy of Healthcare Power of Attorney in Chart? No - copy requested  No - copy requested   No - copy requested   Would patient like information on creating a medical advance directive?    No - Patient declined No - Patient declined       Data saved with a previous flowsheet row definition    Current Medications (verified) Outpatient Encounter Medications as of 07/30/2024  Medication Sig   albuterol  (VENTOLIN  HFA) 108 (90 Base) MCG/ACT inhaler Inhale 2 puffs into the lungs every 6 (six) hours as needed for wheezing or shortness of breath.   allopurinol  (ZYLOPRIM ) 300 MG tablet TAKE 1 TABLET BY MOUTH TWICE A DAY   benzonatate  (TESSALON ) 100 MG capsule TAKE 1 CAPSULE BY MOUTH THREE TIMES A DAY AS NEEDED   betamethasone  dipropionate 0.05 % cream Apply topically 2 (two) times daily. Apply to left ear for 7 days when itches.   budesonide -formoterol  (SYMBICORT ) 160-4.5 MCG/ACT inhaler Inhale 2 puffs into the lungs 2 (two) times daily.   CALCIUM -MAGNESIUM-ZINC  PO Take by mouth.   Clotrimazole  POWD 1 application  by Does not apply route 2 (two) times daily. Apply after applying Clotrimazole  cream.   clotrimazole -betamethasone  (LOTRISONE ) cream APPLY 1 APPLICATION TOPICALLY 2 (TWO) TIMES DAILY. DRY SKIN WELL FIRST, THEN APPLY TO RASH UNDER YOUR STOMACH AND GROIN AREAS.  CRANBERRY PO Take by mouth.   diazepam  (VALIUM ) 10 MG tablet Take 0.5-1 tablets (5-10 mg total) by mouth every 12 (twelve) hours as needed (muscle spasms of neck. do not drive for 8 hours after taking. do not take on same day you take clonazepam  or within 12 hours.).   diclofenac  sodium (VOLTAREN ) 1 % GEL Apply topically to affected area qid   Ginger, Zingiber officinalis, (GINGER ROOT) 550 MG CAPS Take by mouth.    ipratropium (ATROVENT ) 0.03 % nasal spray Place 2 sprays into both nostrils every 12 (twelve) hours.   omeprazole  (PRILOSEC ) 20 MG capsule TAKE 1 CAPSULE (20 MG TOTAL) BY MOUTH 2 (TWO) TIMES DAILY BEFORE A MEAL.   ondansetron  (ZOFRAN -ODT) 4 MG disintegrating tablet TAKE 1 TABLET BY MOUTH EVERY 8 HOURS AS NEEDED FOR NAUSEA AND VOMITING   OVER THE COUNTER MEDICATION Cherry extract bid   OVER THE COUNTER MEDICATION B-12 one daily   rosuvastatin  (CRESTOR ) 10 MG tablet TAKE 1 TABLET BY MOUTH EVERY DAY   sertraline  (ZOLOFT ) 50 MG tablet TAKE 1 TABLET BY MOUTH EVERY DAY   Sodium Bicarbonate POWD by Does not apply route.   traMADol  (ULTRAM ) 50 MG tablet Take 1 tablet (50 mg total) by mouth every 6 (six) hours as needed for moderate pain (pain score 4-6) or severe pain (pain score 7-10) (Do not drive for 6 hours after taking).   triamcinolone  (NASACORT ) 55 MCG/ACT AERO nasal inhaler Place 1 spray into the nose daily. Start with 1 spray each side twice a day for 3 days, then reduce to daily.   triamcinolone  cream (KENALOG ) 0.1 % Apply 1 application topically 2 (two) times daily. For 7-10 days for recurring eczema issues   TURMERIC PO Take by mouth.   valACYclovir  (VALTREX ) 1000 MG tablet Take 2 pills twice a day for 1 day at first sign of cold sore   valsartan  (DIOVAN ) 80 MG tablet Take 1 tablet (80 mg total) by mouth daily.   clonazePAM  (KLONOPIN ) 1 MG tablet TAKE 1/2 TO 1 TABLET BY MOUTH AT BEDTIME AS NEEDED *DO NOT DRIVE FOR AT LEAST 8 HOURS AFTER TAKING* (Patient not taking: Reported on 07/30/2024)   No facility-administered encounter medications on file as of 07/30/2024.    Allergies (verified) Epinephrine , Codeine, Iodinated contrast media, Ozempic  (0.25 or 0.5 mg-dose) [semaglutide (0.25 or 0.5mg -dos)], Penicillins, and Prednisone    History: Past Medical History:  Diagnosis Date   Anxiety    years ago - no longer an issue   Arthritis    gout   Cataracts, bilateral    Complication of  anesthesia    Fracture of pelvis (HCC)    GERD (gastroesophageal reflux disease)    Hx of chest pain    anxiety   Hyperlipidemia    INSOMNIA 10/14/2008   in past   Joint pain    Kidney problem    Low blood sugar    eats several smaller meals a day   Obesity    Palpitations    Stress related    Proteinuria    H/O   Tobacco abuse    quit smoking 08/2013   Past Surgical History:  Procedure Laterality Date   ABDOMINAL HYSTERECTOMY     APPENDECTOMY     with hysterectomy   childbirth     x4   CHOLECYSTECTOMY  2001   EYE SURGERY Bilateral 2015   cataract surgery with lens implant   NEPHRECTOMY  1992   complex cystic mass - nephrectomy right   ORIF  ANKLE FRACTURE Right 07/02/2014   Procedure: OPEN REDUCTION INTERNAL FIXATION (ORIF) RIGHT ANKLE BIMALLOELAR FRACTURE;  Surgeon: Norleen Armor, MD;  Location: [facility] OR;  Service: Orthopedics;  Laterality: Right;   OTHER SURGICAL HISTORY     hysterectomy, R ovary remains   SHOULDER SURGERY     bone spurs left   Family History  Problem Relation Age of Onset   Ovarian cancer Mother        Dr. Layla    Arthritis Mother    Obesity Mother    Stroke Father    Alzheimer's disease Father        early, states also parkinsons. died 59   Hypertension Father    Obesity Father    Colon cancer Paternal Grandmother    Liver disease Daughter        On hospice age 73   Esophageal cancer Neg Hx    Stomach cancer Neg Hx    Rectal cancer Neg Hx    Social History   Socioeconomic History   Marital status: Divorced    Spouse name: Al Bunge   Number of children: 4   Years of education: Not on file   Highest education level: Some college, no degree  Occupational History   Occupation: retired  Tobacco Use   Smoking status: Former    Current packs/day: 0.00    Average packs/day: 1 pack/day for 35.0 years (35.0 ttl pk-yrs)    Types: Cigarettes    Start date: 09/01/1978    Quit date: 09/01/2013    Years since quitting: 10.9   Smokeless  tobacco: Never   Tobacco comments:    Former smoker with no desire to smoke again. Smoking years edited at patients request to include periods when she did not smoke.  Vaping Use   Vaping status: Never Used  Substance and Sexual Activity   Alcohol use: Not Currently    Alcohol/week: 1.0 standard drink of alcohol    Types: 1 Shots of liquor per week    Comment: occasional   Drug use: No   Sexual activity: Not on file  Other Topics Concern   Not on file  Social History Narrative   Divorced. Found love of her life recently in 2016 (Al). 3 sons. 5 grandkids.    Daughter (had at age 26 and was adopted). To care for Vick her granddaughter when daughter passes- daughter on hospice.       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      Hobbies: grandchildren, personal training at [employer], time with dog   Social Drivers of Health   Financial Resource Strain: Low Risk  (07/29/2024)   Overall Financial Resource Strain (CARDIA)    Difficulty of Paying Living Expenses: Not hard at all  Food Insecurity: No Food Insecurity (07/29/2024)   Hunger Vital Sign    Worried About Running Out of Food in the Last Year: Never true    Ran Out of Food in the Last Year: Never true  Transportation Needs: No Transportation Needs (07/29/2024)   PRAPARE - Administrator, Civil Service (Medical): No    Lack of Transportation (Non-Medical): No  Physical Activity: Insufficiently Active (07/29/2024)   Exercise Vital Sign    Days of Exercise per Week: 2 days    Minutes of Exercise per Session: 60 min  Stress: No Stress Concern Present (07/29/2024)   [place]  [employer] - Occupational Stress Questionnaire    Feeling of Stress: Not at all  Social Connections: Moderately Integrated (07/29/2024)   Social Connection and Isolation Panel    Frequency of Communication with  Friends and Family: More than three times a week    Frequency of Social Gatherings with Friends and Family: Three times a week    Attends Religious Services: More than 4 times per year    Active Member of Clubs or Organizations: Yes    Attends Banker Meetings: More than 4 times per year    Marital Status: Divorced    Tobacco Counseling Counseling given: Not Answered Tobacco comments: Former smoker with no desire to smoke again. Smoking years edited at patients request to include periods when she did not smoke.    Clinical Intake:  Pre-visit preparation completed: Yes  Pain : No/denies pain     BMI - recorded: 43.7 Nutritional Status: BMI > 30  Obese Diabetes: No  Lab Results  Component Value Date   HGBA1C 6.2 05/14/2024   HGBA1C 6.3 11/16/2023   HGBA1C 6.1 04/23/2023     How often do you need to have someone help you when you read instructions, pamphlets, or other written materials from your doctor or pharmacy?: 1 - Never  Interpreter Needed?: No  Information entered by :: Ellouise Haws, LPN   Activities of Daily Living     07/29/2024   11:01 AM  In your present state of health, do you have any difficulty performing the following activities:  Hearing? 1  Comment hearing aids  Vision? 0  Difficulty concentrating or making decisions? 0  Walking or climbing stairs? 1  Comment due to hip  Dressing or bathing? 0  Doing errands, shopping? 0  Preparing Food and eating ? N  Using the Toilet? N  In the past six months, have you accidently leaked urine? Y  Do you have problems with loss of bowel control? N  Managing your Medications? N  Managing your Finances? N  Housekeeping or managing your Housekeeping? N    Patient Care Team: Katrinka Garnette KIDD, MD as PCP - General (Family Medicine) Octavia Charleston, MD as Consulting Physician (Ophthalmology)  I have updated your Care Teams any recent Medical Services you may have received from other providers in  the past year.     Assessment:   This is a routine wellness examination for Angie Velasquez.  Hearing/Vision screen Hearing Screening - Comments:: Pt has hearing aids  Vision Screening - Comments:: Wears rx glasses - up to date with routine eye exams with Dr Octavia    Goals Addressed             This Visit's Progress    Patient Stated       Get strength back up        Depression Screen     07/30/2024    2:20 PM 11/21/2023    9:54 AM 08/23/2023    2:06 PM 06/21/2023    1:12 PM 05/18/2023    3:55 PM 02/13/2023   11:18 AM 10/20/2022    2:08 PM  PHQ 2/9 Scores  PHQ - 2 Score 0 0 0 0 0 1 0  PHQ- 9 Score  3 0 0 0 2 0    Fall Risk     07/29/2024   11:01 AM 11/21/2023    9:54 AM 08/23/2023    2:06 PM 06/21/2023   11:12 AM 02/13/2023   11:18 AM  Fall  Risk   Falls in the past year? 0 1 0 0 0  Number falls in past yr: 0 0 0 0 0  Injury with Fall? 0 1 0 0 0  Risk for fall due to : No Fall Risks No Fall Risks No Fall Risks Impaired vision No Fall Risks  Follow up Falls prevention discussed Falls evaluation completed Falls evaluation completed Falls prevention discussed Falls evaluation completed    MEDICARE RISK AT HOME:  Medicare Risk at Home Any stairs in or around the home?: (Patient-Rptd) No Home free of loose throw rugs in walkways, pet beds, electrical cords, etc?: (Patient-Rptd) Yes Adequate lighting in your home to reduce risk of falls?: (Patient-Rptd) Yes Life alert?: (Patient-Rptd) No Use of a cane, walker or w/c?: (Patient-Rptd) No Grab bars in the bathroom?: (Patient-Rptd) Yes Shower chair or bench in shower?: (Patient-Rptd) Yes Elevated toilet seat or a handicapped toilet?: (Patient-Rptd) Yes  TIMED UP AND GO:  Was the test performed?  No  Cognitive Function: 6CIT completed        07/30/2024    2:23 PM 06/21/2023    1:15 PM 06/02/2022    1:24 PM  6CIT Screen  What Year? 0 points 0 points 0 points  What month? 0 points 0 points 0 points  What time? 0 points 0  points 0 points  Count back from 20 0 points 0 points 0 points  Months in reverse 0 points 0 points 0 points  Repeat phrase 0 points 0 points 0 points  Total Score 0 points 0 points 0 points    Immunizations Immunization History  Administered Date(s) Administered   INFLUENZA, HIGH DOSE SEASONAL PF 09/29/2016, 09/05/2017, 08/19/2018   Influenza,inj,Quad PF,6+ Mos 08/24/2015   Influenza,inj,quad, With Preservative 08/27/2017   Moderna Sars-Covid-2 Vaccination 02/14/2020, 03/07/2020   Pneumococcal Conjugate-13 03/12/2015   Pneumococcal Polysaccharide-23 01/09/2017   Td 04/24/2016   Zoster, Live 03/12/2015    Screening Tests Health Maintenance  Topic Date Due   Lung Cancer Screening  05/13/2024   INFLUENZA VACCINE  07/04/2024   Zoster Vaccines- Shingrix (1 of 2) 08/15/2024 (Originally 05/27/1997)   MAMMOGRAM  09/25/2024   Medicare Annual Wellness (AWV)  07/30/2025   DTaP/Tdap/Td (2 - Tdap) 04/24/2026   Colonoscopy  10/18/2027   Pneumococcal Vaccine: 50+ Years  Completed   DEXA SCAN  Completed   Hepatitis C Screening  Completed   HPV VACCINES  Aged Out   Meningococcal B Vaccine  Aged Out   COVID-19 Vaccine  Discontinued    Health Maintenance  Health Maintenance Due  Topic Date Due   Lung Cancer Screening  05/13/2024   INFLUENZA VACCINE  07/04/2024   Health Maintenance Items Addressed: See Nurse Notes at the end of this note  Additional Screening:  Vision Screening: Recommended annual ophthalmology exams for early detection of glaucoma and other disorders of the eye. Would you like a referral to an eye doctor? No    Dental Screening: Recommended annual dental exams for proper oral hygiene  Community Resource Referral / Chronic Care Management: CRR required this visit?  No   CCM required this visit?  No   Plan:    I have personally reviewed and noted the following in the patient's chart:   Medical and social history Use of alcohol, tobacco or illicit drugs   Current medications and supplements including opioid prescriptions. Patient is currently taking opioid prescriptions. Information provided to patient regarding non-opioid alternatives. Patient advised to discuss non-opioid treatment plan with their provider. Functional  ability and status Nutritional status Physical activity Advanced directives List of other physicians Hospitalizations, surgeries, and ER visits in previous 12 months Vitals Screenings to include cognitive, depression, and falls Referrals and appointments  In addition, I have reviewed and discussed with patient certain preventive protocols, quality metrics, and best practice recommendations. A written personalized care plan for preventive services as well as general preventive health recommendations were provided to patient.   Ellouise VEAR Haws, LPN   1/72/7974   After Visit Summary: (MyChart) Due to this being a telephonic visit, the after visit summary with patients personalized plan was offered to patient via MyChart   Notes: Nothing significant to report at this time. Ordered 05/13/24 pt stated completed at [facility] triad

## 2024-07-30 NOTE — Patient Instructions (Signed)
 Angie Velasquez , Thank you for taking time out of your busy schedule to complete your Annual Wellness Visit with me. I enjoyed our conversation and look forward to speaking with you again next year. I, as well as your care team,  appreciate your ongoing commitment to your health goals. Please review the following plan we discussed and let me know if I can assist you in the future. Your Game plan/ To Do List    Referrals: If you haven't heard from the office you've been referred to, please reach out to them at the phone provided.   Follow up Visits: We will see or speak with you next year for your Next Medicare AWV with our clinical staff Have you seen your provider in the last 6 months (3 months if uncontrolled diabetes)? Yes  Clinician Recommendations:  Each day, aim for 6 glasses of water, plenty of protein in your diet and try to get up and walk/ stretch every hour for 5-10 minutes at a time.        This is a list of the screenings recommended for you:  Health Maintenance  Topic Date Due   Screening for Lung Cancer  05/13/2024   Medicare Annual Wellness Visit  06/20/2024   Flu Shot  07/04/2024   Zoster (Shingles) Vaccine (1 of 2) 08/15/2024*   Mammogram  09/25/2024   DTaP/Tdap/Td vaccine (2 - Tdap) 04/24/2026   Colon Cancer Screening  10/18/2027   Pneumococcal Vaccine for age over 71  Completed   DEXA scan (bone density measurement)  Completed   Hepatitis C Screening  Completed   HPV Vaccine  Aged Out   Meningitis B Vaccine  Aged Out   COVID-19 Vaccine  Discontinued  *Topic was postponed. The date shown is not the original due date.    Advanced directives: (Copy Requested) Please bring a copy of your health care power of attorney and living will to the office to be added to your chart at your convenience. You can mail to [facility] [address] [place], [place] [address] or email to [email or web address] Advance Care Planning is important because  it:  [x]  Makes sure you receive the medical care that is consistent with your values, goals, and preferences  [x]  It provides guidance to your family and loved ones and reduces their decisional burden about whether or not they are making the right decisions based on your wishes.  Follow the link provided in your after visit summary or read over the paperwork we have mailed to you to help you started getting your Advance Directives in place. If you need assistance in completing these, please reach out to us  so that we can help you!  See attachments for Preventive Care and Fall Prevention Tips.

## 2024-08-01 ENCOUNTER — Encounter (HOSPITAL_BASED_OUTPATIENT_CLINIC_OR_DEPARTMENT_OTHER): Admitting: Physical Therapy

## 2024-08-06 NOTE — Progress Notes (Unsigned)
 08/06/2024 Angie Velasquez [id number] Apr 09, 1946   Chief Complaint: Nausea   History of Present Illness: Angie Velasquez is a 78 year old female with a past medical history of anxiety, arthritis, gout, hyperlipidemia, obesity, hepatic steatosis, colon polyps, diverticulosis, GERD and chronic nausea. Past cholecystectomy. She is known by Dr. Abran. She was last seen in office by Vina Dasen NP 03/15/2023 due to having nausea for a few months. EGD     Latest Ref Rng & Units 05/14/2024   11:13 AM 11/16/2023    8:44 AM 05/14/2023   12:16 PM  CBC  WBC 4.0 - 10.5 K/uL 6.8  7.5  6.4   Hemoglobin 12.0 - 15.0 g/dL 87.0  87.3  86.8   Hematocrit 36.0 - 46.0 % 39.1  37.9  38.4   Platelets 150.0 - 400.0 K/uL 210.0  208.0  200        Latest Ref Rng & Units 05/14/2024   11:13 AM 11/16/2023    8:44 AM 08/23/2023    3:04 PM  CMP  Glucose 70 - 99 mg/dL 881  886  95   BUN 6 - 23 mg/dL 14  21  15    Creatinine 0.40 - 1.20 mg/dL 8.81  8.59  8.77   Sodium 135 - 145 mEq/L 140  142  140   Potassium 3.5 - 5.1 mEq/L 4.1  4.1  3.7   Chloride 96 - 112 mEq/L 106  105  104   CO2 19 - 32 mEq/L 27  26  25    Calcium  8.4 - 10.5 mg/dL 9.1  8.9  9.0   Total Protein 6.0 - 8.3 g/dL 6.6  6.4  6.6   Total Bilirubin 0.2 - 1.2 mg/dL 0.4  0.4  0.3   Alkaline Phos 39 - 117 U/L 87  84  90   AST 0 - 37 U/L 30  21  27    ALT 0 - 35 U/L 31  21  31      EGD 05/03/2023: 1. Distal esophageal ring or stricture  2. Small hiatal hernia  3. Benign fundic gland polyps  4. Otherwise unremarkable EGD  5. No cause for chronic nausea found. Suspect lingering effects of Ozempic .  Colonoscopy 10/17/2022: - One 4 mm polyp in the sigmoid colon, removed with a cold snare. Resected and retrieved.  - Diverticulosis in the left colon. - The examination was otherwise normal on direct and retroflexion views. - No further colon polyp surveillance colonoscopies due to age  TUBULAR ADENOMA NEGATIVE FOR HIGH-GRADE  DYSPLASIA AND CARCINOMA STALK MARGIN FREE OF ADENOMATOUS CHANGE  Current Medications, Allergies, Past Medical History, Past Surgical History, Family History and Social History were reviewed in [employer] record.   Review of Systems:   Constitutional: Negative for fever, sweats, chills or weight loss.  Respiratory: Negative for shortness of breath.   Cardiovascular: Negative for chest pain, palpitations and leg swelling.  Gastrointestinal: See HPI.  Musculoskeletal: Negative for back pain or muscle aches.  Neurological: Negative for dizziness, headaches or paresthesias.    Physical Exam: There were no vitals taken for this visit. General: in no acute distress. Head: Normocephalic and atraumatic. Eyes: No scleral icterus. Conjunctiva pink . Ears: Normal auditory acuity. Mouth: Dentition intact. No ulcers or lesions.  Lungs: Clear throughout to auscultation. Heart: Regular rate and rhythm, no murmur. Abdomen: Soft, nontender and nondistended. No masses or hepatomegaly. Normal bowel sounds x 4 quadrants.  Rectal: Deferred.  Musculoskeletal: Symmetrical with no gross  deformities. Extremities: No edema. Neurological: Alert oriented x 4. No focal deficits.  Psychological: Alert and cooperative. Normal mood and affect  Assessment and Recommendations: ***

## 2024-08-07 ENCOUNTER — Encounter: Payer: Self-pay | Admitting: Nurse Practitioner

## 2024-08-07 ENCOUNTER — Ambulatory Visit: Admitting: Nurse Practitioner

## 2024-08-07 VITALS — BP 108/70 | HR 72 | Ht 67.0 in | Wt 282.0 lb

## 2024-08-07 DIAGNOSIS — K219 Gastro-esophageal reflux disease without esophagitis: Secondary | ICD-10-CM | POA: Diagnosis not present

## 2024-08-07 DIAGNOSIS — Z860101 Personal history of adenomatous and serrated colon polyps: Secondary | ICD-10-CM | POA: Diagnosis not present

## 2024-08-07 DIAGNOSIS — R110 Nausea: Secondary | ICD-10-CM

## 2024-08-07 NOTE — Patient Instructions (Signed)
 Gaviscon (over the counter) - 1 tablespoon three times a day as needed.   Eat 3-4 small snack sized meals daily.  You have been scheduled for a gastric emptying scan at [facility] on 08/27/24 at 7:30 am. Please arrive at least 30 minutes prior to your appointment for registration. Please make certain not to have anything to eat or drink after midnight the night before your test. Hold all stomach medications (ex: Zofran , phenergan, Reglan ) 8 hours prior to your test. If you need to reschedule your appointment, please contact radiology scheduling at [phone]. _____________________________________________________________________ A gastric-emptying study measures how long it takes for food to move through your stomach. There are several ways to measure stomach emptying. In the most common test, you eat food that contains a small amount of radioactive material. A scanner that detects the movement of the radioactive material is placed over your abdomen to monitor the rate at which food leaves your stomach. This test normally takes about 4 hours to complete. _____________________________________________________________________  _______________________________________________________  If your blood pressure at your visit was 140/90 or greater, please contact your primary care physician to follow up on this.  _______________________________________________________  If you are age 90 or older, your body mass index should be between 23-30. Your Body mass index is 44.17 kg/m. If this is out of the aforementioned range listed, please consider follow up with your Primary Care Provider.  If you are age 50 or younger, your body mass index should be between 19-25. Your Body mass index is 44.17 kg/m. If this is out of the aformentioned range listed, please consider follow up with your Primary Care Provider.   ________________________________________________________  The [facility] providers would  like to encourage you to use MYCHART to communicate with providers for non-urgent requests or questions.  Due to long hold times on the telephone, sending your provider a message by [facility] may be a faster and more efficient way to get a response.  Please allow 48 business hours for a response.  Please remember that this is for non-urgent requests.  _______________________________________________________  [facility] is using a team-based approach to care.  Your team is made up of your doctor and two to three APPS. Our APPS (Nurse Practitioners and Physician Assistants) work with your physician to ensure care continuity for you. They are fully qualified to address your health concerns and develop a treatment plan. They communicate directly with your gastroenterologist to care for you. Seeing the Advanced Practice Practitioners on your physician's team can help you by facilitating care more promptly, often allowing for earlier appointments, access to diagnostic testing, procedures, and other specialty referrals.

## 2024-08-12 NOTE — Progress Notes (Signed)
 Reviewed. Lets see what the emptying scan shows. I would be reluctant to use Reglan  in the 78 year old.  Thanks

## 2024-08-13 ENCOUNTER — Encounter

## 2024-08-14 ENCOUNTER — Encounter: Payer: Self-pay | Admitting: Family Medicine

## 2024-08-14 NOTE — Telephone Encounter (Signed)
 Linda/Dottie, please contact patient and let her know it is unlikely that her small hiatal hernia seen on EGD is the cause of her pain. Due to her description of hiatal hernia pain with exercise, I am concerned that her symptoms are cardiac in etiology. Please have patient contact her PCP today to further discuss cardiology referral and expedited cardiac evaluation. If patient already has a cardiologist, she should contact her cardiologist today. Patient to go to the emergency room if she has any chest pain.   EGD 05/03/2023: 1. Distal esophageal ring or stricture  2. Small hiatal hernia  3. Benign fundic gland polyps  4. Otherwise unremarkable EGD  5. No cause for chronic nausea found. Suspect lingering effects of Ozempic .

## 2024-08-15 ENCOUNTER — Ambulatory Visit (HOSPITAL_BASED_OUTPATIENT_CLINIC_OR_DEPARTMENT_OTHER): Attending: Family Medicine

## 2024-08-15 ENCOUNTER — Encounter (HOSPITAL_BASED_OUTPATIENT_CLINIC_OR_DEPARTMENT_OTHER): Payer: Self-pay

## 2024-08-15 DIAGNOSIS — M79604 Pain in right leg: Secondary | ICD-10-CM | POA: Insufficient documentation

## 2024-08-15 DIAGNOSIS — M6281 Muscle weakness (generalized): Secondary | ICD-10-CM | POA: Insufficient documentation

## 2024-08-15 DIAGNOSIS — M5459 Other low back pain: Secondary | ICD-10-CM | POA: Insufficient documentation

## 2024-08-15 NOTE — Therapy (Signed)
 OUTPATIENT PHYSICAL THERAPY THORACOLUMBAR TREATMENT    Patient Name: Angie Velasquez MRN: [id number] DOB:21-Jun-1946, 78 y.o., female Today's Date: 08/15/2024  END OF SESSION:  PT End of Session - 08/15/24 1202     Visit Number 3    Number of Visits 8    Date for PT Re-Evaluation 08/21/24    Authorization Type HTA    Progress Note Due on Visit 10    PT Start Time 1148    PT Stop Time 1233    PT Time Calculation (min) 45 min    Activity Tolerance Patient tolerated treatment well    Behavior During Therapy WFL for tasks assessed/performed            Past Medical History:  Diagnosis Date   Anxiety    years ago - no longer an issue   Arthritis    gout   Cataracts, bilateral    Complication of anesthesia    Fracture of pelvis (HCC)    GERD (gastroesophageal reflux disease)    Hx of chest pain    anxiety   Hyperlipidemia    INSOMNIA 10/14/2008   in past   Joint pain    Kidney problem    Low blood sugar    eats several smaller meals a day   Obesity    Palpitations    Stress related    Proteinuria    H/O   Tobacco abuse    quit smoking 08/2013   Past Surgical History:  Procedure Laterality Date   ABDOMINAL HYSTERECTOMY     APPENDECTOMY     with hysterectomy   childbirth     x4   CHOLECYSTECTOMY  2001   EYE SURGERY Bilateral 2015   cataract surgery with lens implant   NEPHRECTOMY  1992   complex cystic mass - nephrectomy right   ORIF ANKLE FRACTURE Right 07/02/2014   Procedure: OPEN REDUCTION INTERNAL FIXATION (ORIF) RIGHT ANKLE BIMALLOELAR FRACTURE;  Surgeon: Norleen Armor, MD;  Location: [facility] OR;  Service: Orthopedics;  Laterality: Right;   OTHER SURGICAL HISTORY     hysterectomy, R ovary remains   SHOULDER SURGERY     bone spurs left   Patient Active Problem List   Diagnosis Date Noted   Obesity, morbid (HCC) 11/21/2023   Upper airway cough syndrome 08/09/2023   Fatty liver 06/12/2022   Snoring 01/06/2020   CKD (chronic kidney disease),  stage III (HCC) 02/14/2019   Osteoarthritis of spine with radiculopathy, cervical region 02/12/2018   Intertrigo 01/10/2018   History of osteitis pubis 12/19/2017   Prediabetes 10/22/2017   Hyperglycemia 09/18/2017   Atypical chest pain 03/26/2017   Depression 12/19/2016   Essential hypertension 01/04/2016   Eczema 06/29/2015   Gout 03/12/2015   S/p nephrectomy 03/12/2015   Hyperlipidemia 03/12/2015   Tinnitus 03/12/2015   COPD (chronic obstructive pulmonary disease) (HCC) 03/12/2015   History of colonic polyps 03/12/2015   Bimalleolar fracture 07/02/2014   Arthritis    Former smoker    Anxiety state 12/09/2009   Vitamin D  deficiency 05/28/2009   Hematuria, unspecified 05/26/2009   Contact dermatitis and other eczema, due to unspecified cause 05/26/2009   Arthropathy 05/26/2009   Insomnia, unspecified 04/27/2009   DDD (degenerative disc disease), lumbosacral 01/20/2009   Hearing loss 01/20/2009   Sialoadenitis 01/20/2009   GERD 01/23/2008   MICROALBUMINURIA 01/23/2008   Microscopic hematuria 01/23/2008   UTI (urinary tract infection) 01/08/2008    PCP: Katrinka Garnette KIDD, MD   REFERRING PROVIDER: Katrinka Garnette KIDD,  MD   REFERRING DIAG: M54.50,G89.29 (ICD-10-CM) - Chronic bilateral low back pain, unspecified whether sciatica present   Rationale for Evaluation and Treatment: Rehabilitation  THERAPY DIAG:  Pain in right leg  Muscle weakness (generalized)  Other low back pain  ONSET DATE: approx 1 year ago  SUBJECTIVE:                                                                                                                                                                                           SUBJECTIVE STATEMENT:  Reports no increase in pain since starting PT. The only time my back hurts is when I'm standing for a long time especially when vacuuming.      EVAL: Pt states her pain began approx 1 year ago with no specific MOI. Pt was seen in PT in  2023 for osteitis pubis and received dry needling.  Pt responded well to PT and stopped using her walker after PT except for longer distance.  Pt sometimes performs her HEP. Pt has been receiving personal training 2x/wk at [facility] for approx 3 weeks.  Her back starts hurting with standing activities, household chores, and walking and she has to sit down.  Pt has fatigue and pain with performing stairs.  She avoids stairs.  Pt states she has pain that travels to R hip and also in R LE.  Pt does seated Hip ER stretch which helps. Pt lies down in the middle of the day and rests which helps.    PERTINENT HISTORY:  Trace anterolisthesis of L4 on L5 per CT Pt states she has a R sided hernia. OA, gout ORIF for R ankle bimalleolar Fx 2015  CKD, stage 3 s/p Nephrectomy in 1992 Obesity, depression, L shoulder surgery 5 mm nodule on 05/14/2023 and right upper lobe   PAIN:  NPRS:  2/10 current, >10/10 worst   Location:  central lumbar and bilat lumbar flanks.  It can travel to R LE sometimes but not recently Aching pain- standing too long makes it worse, getting off feet makes it better   PRECAUTIONS:  Trace anterolisthesis of L4 on L5.   WEIGHT BEARING RESTRICTIONS: No  FALLS:  Has patient fallen in last 6 months? No  LIVING ENVIRONMENT: Lives with: lives alone Lives in: 1 story home Stairs: 2 steps to enter home with rails Has following equipment at home:   OCCUPATION: Pt is retired  PLOF: Independent  Pt has someone who comes once per month to help clean home.  PATIENT GOALS: to eliminate pain, to find exercises to help   OBJECTIVE:  Note: Objective measures were completed at Evaluation unless otherwise  noted.  DIAGNOSTIC FINDINGS:  CT abdomen/pelvis 12/24: Musculoskeletal: Curvature of the spine. Scattered degenerative changes seen along the spine and pelvis. Trace anterolisthesis of L4 on L5. Multilevel stenosis along the lower lumbar spine.  PATIENT SURVEYS:  Modified  Oswestry:  MODIFIED OSWESTRY DISABILITY SCALE  Date: 07/10/2024 Score  Pain intensity 1 = The pain is bad, but I can manage without having to take (1) I can stand as long as I want but, it increases my pain. pain medication.  2. Personal care (washing, dressing, etc.) 0 =  I can take care of myself normally without causing increased pain.  3. Lifting 2 = Pain prevents me from lifting heavy weights off the floor, activities (eg. sports, dancing). but I can manage if the weights are conveniently positioned (3) Pain prevents me from going out very often. (eg, on a table).  4. Walking 1 = Pain prevents me from walking more than 1 mile.  5. Sitting 1 =  I can only sit in my favorite chair as long as I like.  6. Standing 4 =  Pain prevents me from standing more than 10 minutes.  7. Sleeping 0 = Pain does not prevent me from sleeping well.  8. Social Life 0 = My social life is normal and does not increase my pain.  9. Traveling 1 =  I can travel anywhere, but it increases my pain.  10. Employment/ Homemaking 0 = My normal homemaking/job activities do not cause pain.  Total 10/50  = 20%   Interpretation of scores: Score Category Description  0-20% Minimal Disability The patient can cope with most living activities. Usually no treatment is indicated apart from advice on lifting, sitting and exercise  21-40% Moderate Disability The patient experiences more pain and difficulty with sitting, lifting and standing. Travel and social life are more difficult and they may be disabled from work. Personal care, sexual activity and sleeping are not grossly affected, and the patient can usually be managed by conservative means  41-60% Severe Disability Pain remains the main problem in this group, but activities of daily living are affected. These patients require a detailed investigation  61-80% Crippled Back pain impinges on all aspects of the patient's life. Positive intervention is required  81-100% Bed-bound   These patients are either bed-bound or exaggerating their symptoms  Bluford FORBES Zoe DELENA Karon DELENA, et al. Surgery versus conservative management of stable thoracolumbar fracture: the PRESTO feasibility RCT. [place]): [employer]; 2021 Nov. [facility] Technology Assessment, No. 25.62.) Appendix 3, Oswestry Disability Index category descriptors. Available from: FindJewel[employer].cz  Minimally Clinically Important Difference (MCID) = 12.8%  COGNITION: Overall cognitive status: Within functional limits for tasks assessed       MUSCLE LENGTH: Hamstrings: tightness in bilat HS, L > R  PALPATION: TTP in bilat lateral lumbar flanks  LUMBAR ROM:   AROM eval  Flexion WFL  Extension   Right lateral flexion WFL  Left lateral flexion WFL  Right rotation WFL  Left rotation WFL   (Blank rows = not tested)  LOWER EXTREMITY MMT:    MMT Right eval Left eval  Hip flexion 5/5 4/5  Hip extension    Hip abduction < 3/5 3+/5  Hip adduction    Hip internal rotation    Hip external rotation 5/5 4/5  Knee flexion 5/5 Seated 5/5 seated  Knee extension 5/5 5/5  Ankle dorsiflexion 5/5 5/5  Ankle plantarflexion WFL seated WFL seated  Ankle inversion    Ankle eversion     (  Blank rows = not tested)  LUMBAR SPECIAL TESTS:  SLR test:  negative bilat   GAIT: Assistive device utilized: None Level of assistance: Complete Independence Comments: limited reciprocal arm swing and pelvic rotation  TREATMENT:                                                                                                                                08/15/24  QL stretch seated  Lumbar flexion stretch Lat stretch seated Sit to stands 2x10 from plinht Seated marching with TrA 2x10 PPT 2x10 x3 seconds Figure 4 stretch 2x30 seconds B Piriformis stretch hooklying Sidelying clams 2x10ea Standing PNF chop with ball x10 ea(started feeling abdominal spasm) Standing oblique  stretch (wide legs) 10sec x2ea Prone on elbows Prone press ups x10 (for abdominal stretch)  07/25/24  Bridges x12 PPT 15x3 seconds PPT + march x5 Figure 4 stretch 2x30 seconds B Sidelying clams red TB x12 B Bridge + ABD into red TB x12 Seated QL stretch with stool 3x30 seconds forward only  Seated scapular retraction 15x3 seconds  Backward shoulder rolls x20    07/10/24  Eval     PATIENT EDUCATION:  Education details: dx, POC, rationale of interventions, relevant anatomy, prognosis, and objective findings. PT answered pt's questions.  Person educated: Patient Education method: Explanation Education comprehension: verbalized understanding and needs further education  HOME EXERCISE PROGRAM:  Access Code: [email or web address] URL: [email or web address] Date: 07/25/2024 Prepared by: Josette Rough  Exercises - Supine Bridge  - 1 x daily - 7 x weekly - 2 sets - 10 reps - 2 seconds  hold - Supine Posterior Pelvic Tilt  - 1 x daily - 7 x weekly - 2 sets - 10 reps - 3 seconds  hold - Supine March with Posterior Pelvic Tilt  - 1 x daily - 7 x weekly - 2 sets - 5 reps - Supine Figure 4 Piriformis Stretch  - 1 x daily - 7 x weekly - 2 sets - 4 reps - 30 seconds hold     ASSESSMENT:  CLINICAL IMPRESSION:  Worked on some gentle mobility stretching today with good tolerance. Sit to stands performed without UE support with minimal corrections required. Pt denied increase in LBP with exercises. Care taken with standing hip abduction due to hx of hip pain. She started to experience R sided abdominal discomfort with standing PNF chops. Trialled prone press ups to stretch abdominal region as well as standing oblique stretching. She did feel some benefit from this, so added to HEP. Pt sees a massage therapist weekly, so instructed her to see if she can work gently on oblique area. Will monitor this pain as we progress. Pt interested in trying DN in future.      EVAL: Patient is a 78  y.o. female with a dx of chronic bilat LBP.  Pt has pain in bilat sides of lumbar though states it can travel to R hip and R LE.  She has increased pain with with standing activities, household chores, and ambulation.  Pt avoids stairs due to fatigue and pain.  Pt has good lumbar AROM.  Pt has weakness in bilat hip abd, L hip flex, and L hip ER.  Pt has started seeing a personal trainer 2x/wk.  She should benefit from skilled PT to address impairments and improve overall function.      OBJECTIVE IMPAIRMENTS: decreased activity tolerance, decreased endurance, decreased mobility, difficulty walking, decreased strength, increased fascial restrictions, impaired flexibility, and pain.   ACTIVITY LIMITATIONS: standing, stairs, and locomotion level  PARTICIPATION LIMITATIONS: cleaning, shopping, and community activity  PERSONAL FACTORS: 3+ comorbidities: OA, obesity, R ankle ORIF, R sided hernia, CKD with nephrectomy are also affecting patient's functional outcome.   REHAB POTENTIAL: Good  CLINICAL DECISION MAKING: Stable/uncomplicated  EVALUATION COMPLEXITY: Low   GOALS:   SHORT TERM GOALS: Target date:  07/31/24   Pt will be independent and compliant with HEP for improved pain, strength, and function.  Baseline: Goal status: ONGOING 07/25/24 initial assigned today   2.  Pt will report improved tolerance with standing in order to perform increased household chores.  Baseline:  Goal status: INITIAL  3.  Pt will report at least a 25% improvement in pain and sx's overall.  Baseline:  Goal status: INITIAL   LONG TERM GOALS: Target date: 08/21/2024   Pt will demo improved LE strength to at least 4/5 in R hip abd and 4+/5 in L hip abd and 5/5 in L hip flex and ER for improved performance of and tolerance with functional mobility and household chores.  Baseline:  Goal status: INITIAL  2.  Pt will report she is able to perform her normal household chores without significant pain and  difficulty.   Baseline:  Goal status: INITIAL  3.  Pt will be able to ambulate extended community distance without significant pain and difficulty.  Baseline:  Goal status: INITIAL    PLAN:  PT FREQUENCY: 1-2x/week  PT DURATION: 6 weeks  PLANNED INTERVENTIONS: [id number]- PT Re-evaluation, 97750- Physical Performance Testing, 97110-Therapeutic exercises, 97530- Therapeutic activity, [id number]- Neuromuscular re-education, 97535- Self Care, [id number]- Manual therapy, [phone]- Gait training, [phone]- Aquatic Therapy, Patient/Family education, Stair training, Taping, Joint mobilization, Spinal mobilization, Cryotherapy, and Moist heat.  PLAN FOR NEXT SESSION: HS flexibility.  STM to lumbar paraspinals.  Core and LE strengthening PRE, incorporate postural training/strengthening.  Review her gym program.     Asberry Rodes, PTA  08/15/24 1:40 PM

## 2024-08-21 ENCOUNTER — Encounter: Payer: Self-pay | Admitting: Family Medicine

## 2024-08-21 ENCOUNTER — Ambulatory Visit (INDEPENDENT_AMBULATORY_CARE_PROVIDER_SITE_OTHER): Admitting: Family Medicine

## 2024-08-21 VITALS — BP 126/74 | HR 80 | Temp 97.9°F | Ht 67.0 in | Wt 279.8 lb

## 2024-08-21 DIAGNOSIS — R079 Chest pain, unspecified: Secondary | ICD-10-CM | POA: Diagnosis not present

## 2024-08-21 DIAGNOSIS — J449 Chronic obstructive pulmonary disease, unspecified: Secondary | ICD-10-CM | POA: Diagnosis not present

## 2024-08-21 DIAGNOSIS — E785 Hyperlipidemia, unspecified: Secondary | ICD-10-CM

## 2024-08-21 DIAGNOSIS — R0781 Pleurodynia: Secondary | ICD-10-CM | POA: Diagnosis not present

## 2024-08-21 NOTE — Patient Instructions (Addendum)
 We have placed a referral for you today to cardiology- please call their # if you do not hear by Monday or Tuesday   Stop any activity that increases pain and take it relatively easy until you see cardiology   New or worsening symptoms please seek care immediately- such as worsening chest pain call 911  Recommended follow up: Return for next already scheduled visit or sooner if needed.

## 2024-08-21 NOTE — Progress Notes (Signed)
 Phone [phone] In person visit   Subjective:   Angie Velasquez is a 78 y.o. year old very pleasant female patient who presents for/with See problem oriented charting Chief Complaint  Patient presents with   Chest pain with exercise   Discussed the use of AI scribe software for clinical note transcription with the patient, who gave verbal consent to proceed.  History of Present Illness   Angie Velasquez is a 78 year old female who presents with chest pain and shortness of breath on exertion.  She experiences chest pain primarily on the left side of her chest around central chest, sometimes radiating down her left arm. The pain occurs daily, lasting all day at times but can be shorter on others, and is exacerbated by activities such as climbing stairs, which also causes shortness of breath. She avoids stairs due to the pain and shortness of breath.  Originally started when she was trying to do stairs at [employer] where she works out.  The other exercises that she does including machines and even some biking do not cause similar discomfort  She has a history of a hiatal hernia, which she questions might be causing her nausea.  She has been experiencing ongoing chronic nausea and following up with GI-seem to start after GLP-1 use in the past  She recalls a significant coughing spell while eating breakfast, which was severe enough to prevent her from eating until later that day.  No other cough or shortness of breath reported-only shortness of breath reported is with exertion.  She has a history of coughing related to lisinopril  use, which has since been resolved.  She has undergone a CT scan in July with [facility] for pulmonary nodule follow-up that showed stable pulmonary nodules and some atherosclerosis, as well as a small hiatal hernia. She has not seen a cardiologist recently but did so a long time ago.  She experiences additional pain in her ribs,  described as 'knives sticking in,' which is not associated with her chest pain. This rib pain has been present for a long time several months where the left-sided chest pain has been more in recent weeks. She receives weekly massages which helps some and is involved in personal training gifted by her children.   Past Medical History-  Patient Active Problem List   Diagnosis Date Noted   Depression 12/19/2016    Priority: High   S/p nephrectomy 03/12/2015    Priority: High   Anxiety state 12/09/2009    Priority: High   MICROALBUMINURIA 01/23/2008    Priority: High   Fatty liver 06/12/2022    Priority: Medium    CKD (chronic kidney disease), stage III (HCC) 02/14/2019    Priority: Medium    Prediabetes 10/22/2017    Priority: Medium    Hyperglycemia 09/18/2017    Priority: Medium    Essential hypertension 01/04/2016    Priority: Medium    Gout 03/12/2015    Priority: Medium    Hyperlipidemia 03/12/2015    Priority: Medium    COPD (chronic obstructive pulmonary disease) (HCC) 03/12/2015    Priority: Medium    Arthritis     Priority: Medium    Former smoker     Priority: Medium    Vitamin D  deficiency 05/28/2009    Priority: Medium    Hematuria, unspecified 05/26/2009    Priority: Medium    Hearing loss 01/20/2009    Priority: Medium    Osteoarthritis of spine with radiculopathy, cervical region  02/12/2018    Priority: Low   Intertrigo 01/10/2018    Priority: Low   History of osteitis pubis 12/19/2017    Priority: Low   Eczema 06/29/2015    Priority: Low   Tinnitus 03/12/2015    Priority: Low   History of colonic polyps 03/12/2015    Priority: Low   Bimalleolar fracture 07/02/2014    Priority: Low   Arthropathy 05/26/2009    Priority: Low   Insomnia, unspecified 04/27/2009    Priority: Low   DDD (degenerative disc disease), lumbosacral 01/20/2009    Priority: Low   Sialoadenitis 01/20/2009    Priority: Low   GERD 01/23/2008    Priority: Low   Microscopic  hematuria 01/23/2008    Priority: Low   UTI (urinary tract infection) 01/08/2008    Priority: Low   Obesity, morbid (HCC) 11/21/2023   Upper airway cough syndrome 08/09/2023   Snoring 01/06/2020   Atypical chest pain 03/26/2017   Contact dermatitis and other eczema, due to unspecified cause 05/26/2009    Medications- reviewed and updated Current Outpatient Medications  Medication Sig Dispense Refill   albuterol  (VENTOLIN  HFA) 108 (90 Base) MCG/ACT inhaler Inhale 2 puffs into the lungs every 6 (six) hours as needed for wheezing or shortness of breath. 18 g 2   allopurinol  (ZYLOPRIM ) 300 MG tablet TAKE 1 TABLET BY MOUTH TWICE A DAY 180 tablet 2   benzonatate  (TESSALON ) 100 MG capsule TAKE 1 CAPSULE BY MOUTH THREE TIMES A DAY AS NEEDED 60 capsule 0   betamethasone  dipropionate 0.05 % cream Apply topically 2 (two) times daily. Apply to left ear for 7 days when itches. 30 g 0   budesonide -formoterol  (SYMBICORT ) 160-4.5 MCG/ACT inhaler Inhale 2 puffs into the lungs 2 (two) times daily. 1 each 1   CALCIUM -MAGNESIUM-ZINC  PO Take by mouth.     clonazePAM  (KLONOPIN ) 1 MG tablet TAKE 1/2 TO 1 TABLET BY MOUTH AT BEDTIME AS NEEDED *DO NOT DRIVE FOR AT LEAST 8 HOURS AFTER TAKING* 90 tablet 1   Clotrimazole  POWD 1 application  by Does not apply route 2 (two) times daily. Apply after applying Clotrimazole  cream. 25 g 1   clotrimazole -betamethasone  (LOTRISONE ) cream APPLY 1 APPLICATION TOPICALLY 2 (TWO) TIMES DAILY. DRY SKIN WELL FIRST, THEN APPLY TO RASH UNDER YOUR STOMACH AND GROIN AREAS. 15 g 1   CRANBERRY PO Take by mouth.     diazepam  (VALIUM ) 10 MG tablet Take 0.5-1 tablets (5-10 mg total) by mouth every 12 (twelve) hours as needed (muscle spasms of neck. do not drive for 8 hours after taking. do not take on same day you take clonazepam  or within 12 hours.). 20 tablet 0   diclofenac  sodium (VOLTAREN ) 1 % GEL Apply topically to affected area qid 100 g 2   Ginger, Zingiber officinalis, (GINGER ROOT) 550  MG CAPS Take by mouth.     ipratropium (ATROVENT ) 0.03 % nasal spray Place 2 sprays into both nostrils every 12 (twelve) hours. 30 mL 12   omeprazole  (PRILOSEC ) 20 MG capsule TAKE 1 CAPSULE (20 MG TOTAL) BY MOUTH 2 (TWO) TIMES DAILY BEFORE A MEAL. 180 capsule 3   ondansetron  (ZOFRAN -ODT) 4 MG disintegrating tablet TAKE 1 TABLET BY MOUTH EVERY 8 HOURS AS NEEDED FOR NAUSEA AND VOMITING 180 tablet 3   OVER THE COUNTER MEDICATION Cherry extract bid     OVER THE COUNTER MEDICATION B-12 one daily     rosuvastatin  (CRESTOR ) 10 MG tablet TAKE 1 TABLET BY MOUTH EVERY DAY 90 tablet 1  sertraline  (ZOLOFT ) 50 MG tablet TAKE 1 TABLET BY MOUTH EVERY DAY 90 tablet 1   Sodium Bicarbonate POWD by Does not apply route.     traMADol  (ULTRAM ) 50 MG tablet Take 1 tablet (50 mg total) by mouth every 6 (six) hours as needed for moderate pain (pain score 4-6) or severe pain (pain score 7-10) (Do not drive for 6 hours after taking). 60 tablet 0   triamcinolone  (NASACORT ) 55 MCG/ACT AERO nasal inhaler Place 1 spray into the nose daily. Start with 1 spray each side twice a day for 3 days, then reduce to daily. 1 each 2   triamcinolone  cream (KENALOG ) 0.1 % Apply 1 application topically 2 (two) times daily. For 7-10 days for recurring eczema issues 80 g 1   TURMERIC PO Take by mouth.     valACYclovir  (VALTREX ) 1000 MG tablet Take 2 pills twice a day for 1 day at first sign of cold sore 28 tablet 1   valsartan  (DIOVAN ) 80 MG tablet Take 1 tablet (80 mg total) by mouth daily. 30 tablet 11   No current facility-administered medications for this visit.     Objective:  BP 126/74 (BP Location: Left Arm, Patient Position: Sitting, Cuff Size: Normal)   Pulse 80   Temp 97.9 F (36.6 C) (Temporal)   Ht 5' 7 (1.702 m)   Wt 279 lb 12.8 oz (126.9 kg)   SpO2 94%   BMI 43.82 kg/m  Gen: NAD, resting comfortably CV: RRR no murmurs rubs or gallops Bilateral rib pain noted.  Some pain in left central chest with palpation but not  to the level of with stairs exertion Lungs: CTAB no crackles, wheeze, rhonchi Abdomen: soft/nontender/nondistended/normal bowel sounds. No rebound or guarding.  Ext: Minimal edema Skin: warm, dry  EKG: sinus rhythm with rate 77, normal axis, normal intervals, no hypertrophy, no st or t wave changes  RADIOLOGY Chest CT with [facility]vant: stable Pulmonary nodules 5 mm in right upper lobe, atherosclerosis, small hiatal hernia (06/2024)     Assessment and Plan    # Exertional chest pain with shortness of breath and left arm pain -For several weeks experiencing intermittent chest pain in the left upper to mid chest, radiating to the left arm, associated with shortness of breath, particularly with exertion such as climbing stairs. Symptoms occur daily and can last all day but can also be much shorter. Pain is not associated with exercise on a bicycle or other non-stair activities. Differential diagnosis includes cardiac etiology versus musculoskeletal or hiatal hernia but these would not explain her shortness of breath. CT scan showed coronary artery calcification, indicating a potential cardiac risk and I told her my greatest concern is the fact that this has been exertional. -EKG reassuring today thankfully but we discussed this does not rule out cardiac etiology - Urgent referral to cardiology for further evaluation. - Advise to avoid heavy exertion and stop any activity that increases pain. - Instruct to seek care if symptoms worsen including calling 911.  # Separate issue-chronic musculoskeletal chest wall pain (rib area) -Chronic sharp, knife-like pain in the rib area, not associated with exertional chest pain. Managed with massage therapy.  Apparently she is seen a different physical therapist who recommended evaluation with our physical therapist who does dry needling - Provide a prescription for dry needling therapy with Lauren to address rib area pain-ultimately would be up to more if she  thinks this will be beneficial.  # Hiatal hernia Small hiatal hernia noted on CT.  Unlikely to be the cause of exertional chest pain and shortness of breath but could contribute to chest pain in general.  # Pulmonary nodules, right upper lobe CT scan showed pulmonary nodules measuring five millimeters in the right upper lobe.  Stable with no further follow-up needed    # COPD - refer to pulmonary June 2024 S: radiographic finding initially and initially asymptomatic Maintenance medications: PRIOR Symbicort .  Not using albuterol  at present-denies wheeze with the shortness of breath A/P: Does have some shortness of breath with her episodes of pain but I do not think this is related to her COPD-COPD has been mainly asymptomatic and she has had no wheeze with episodes   #hyperlipidemia S: Medication: Rosuvastatin  10 mg Lab Results  Component Value Date   CHOL 134 11/16/2023   HDL 56.20 11/16/2023   LDLCALC 34 11/16/2023   LDLDIRECT 104.0 04/23/2023   TRIG 221.0 (H) 11/16/2023   CHOLHDL 2 11/16/2023   A/P: Lipids with excellent control even if this were coronary disease with LDL under 55-working on weight loss to likely help triglycerides as well.  If does end up being coronary disease would be worth testing lipoprotein a with very well-controlled LDL   Recommended follow up: Return for next already scheduled visit or sooner if needed. Future Appointments  Date Time Provider Department Center  08/22/2024 11:45 AM Cheryln Asberry BRAVO, PTA [facility] [id number]  08/27/2024  7:30 AM [facility] 2 [facility] [place]  08/29/2024 11:45 AM Michaele Delon CROME [facility] [id number]  09/05/2024 11:00 AM Zaunegger, Prentice RAMAN, PT [facility] [id number]  11/24/2024  1:00 PM Katrinka Garnette KIDD, MD [facility]  08/04/2025  3:00 PM [facility] ANNUAL WELLNESS VISIT 1 [facility] [place]    Lab/Order associations:   ICD-10-CM   1. Exertional chest pain  R07.9 EKG 12-Lead    Ambulatory referral to Cardiology     2. Rib pain  R07.81 Ambulatory referral to Physical Therapy    3. Hyperlipidemia, unspecified hyperlipidemia type  E78.5     4. Chronic obstructive pulmonary disease, unspecified COPD type (HCC)  J44.9       Return precautions advised.  Garnette Katrinka, MD

## 2024-08-22 ENCOUNTER — Encounter (HOSPITAL_BASED_OUTPATIENT_CLINIC_OR_DEPARTMENT_OTHER): Payer: Self-pay

## 2024-08-22 ENCOUNTER — Ambulatory Visit (HOSPITAL_BASED_OUTPATIENT_CLINIC_OR_DEPARTMENT_OTHER)

## 2024-08-24 ENCOUNTER — Encounter: Payer: Self-pay | Admitting: Family Medicine

## 2024-08-27 ENCOUNTER — Encounter: Payer: Self-pay | Admitting: Family Medicine

## 2024-08-27 ENCOUNTER — Ambulatory Visit (HOSPITAL_COMMUNITY)
Admission: RE | Admit: 2024-08-27 | Discharge: 2024-08-27 | Disposition: A | Discharge status: Home / Self Care | Source: Ambulatory Visit | Attending: Nurse Practitioner | Admitting: Nurse Practitioner

## 2024-08-27 DIAGNOSIS — R110 Nausea: Secondary | ICD-10-CM | POA: Diagnosis not present

## 2024-08-27 MED ORDER — TECHNETIUM TC 99M SULFUR COLLOID
1.9700 | Freq: Once | INTRAVENOUS | Status: AC
  Administered 2024-08-27: 1.97 via ORAL

## 2024-08-28 ENCOUNTER — Ambulatory Visit: Payer: Self-pay | Admitting: Nurse Practitioner

## 2024-08-29 ENCOUNTER — Encounter (HOSPITAL_BASED_OUTPATIENT_CLINIC_OR_DEPARTMENT_OTHER): Admitting: Physical Therapy

## 2024-09-01 ENCOUNTER — Encounter: Payer: Self-pay | Admitting: Family Medicine

## 2024-09-01 ENCOUNTER — Ambulatory Visit (INDEPENDENT_AMBULATORY_CARE_PROVIDER_SITE_OTHER): Admitting: Family Medicine

## 2024-09-01 VITALS — BP 128/78 | HR 73 | Temp 97.7°F | Ht 67.0 in | Wt 281.2 lb

## 2024-09-01 DIAGNOSIS — M1A9XX0 Chronic gout, unspecified, without tophus (tophi): Secondary | ICD-10-CM

## 2024-09-01 DIAGNOSIS — N183 Chronic kidney disease, stage 3 unspecified: Secondary | ICD-10-CM | POA: Diagnosis not present

## 2024-09-01 DIAGNOSIS — B029 Zoster without complications: Secondary | ICD-10-CM

## 2024-09-01 DIAGNOSIS — R809 Proteinuria, unspecified: Secondary | ICD-10-CM

## 2024-09-01 MED ORDER — VALACYCLOVIR HCL 1 G PO TABS: 1000.0000 mg | ORAL_TABLET | Freq: Three times a day (TID) | ORAL | 0 refills | Status: AC

## 2024-09-01 NOTE — Patient Instructions (Addendum)
 VISIT SUMMARY: You came in today with itching and pain in your right upper back, which started last night. After examining the area, it was determined that you have shingles (herpes zoster) in the T2-T3 dermatome likely.  YOUR PLAN: SHINGLES (HERPES ZOSTER): You have shingles, which is causing the itching and pain in your right upper back. This is an early stage of the condition. -Take Valtrex  1000 mg three times a day for seven days (total of 21 pills). - adjusting for kidney function actually just do twice daily separated by 12 hours- can keep extra pills for fever blisters -It's important to start treatment early to prevent long-term pain (postherpetic neuralgia). -Valtrex  has a low risk of side effects, but you may experience mild ones. -The location of your shingles should not cause secondary issues like blindness or deafness. -Early treatment is crucial to reduce the risk of long-term pain.   Contains text generated by [employer].    Recommended follow up: Return for as needed for new, worsening, persistent symptoms.

## 2024-09-01 NOTE — Progress Notes (Signed)
 Phone [phone] In person visit   Subjective:   Angie Velasquez is a 78 y.o. year old very pleasant female patient who presents for/with See problem oriented charting Chief Complaint  Patient presents with   Possible Shingles     Pain, burning, itching upper middle back;    Discussed the use of AI scribe software for clinical note transcription with the patient, who gave verbal consent to proceed.  History of Present Illness   Angie Velasquez is a 78 year old female who presents with right upper back itching and pain, suspecting shingles.  The itching and pain in her right upper back began last night, waking her from sleep. She describes the sensation as an itch that she 'can't scratch' and rates the pain as a five or six out of ten, which is significant given her high pain threshold.  The affected area is described by the patient as a circle or oval on her right upper back. The discomfort feels like 'something heavy' in the area and sometimes radiates to her arm, although this is not consistent.  She has not had any new medications or exposures to perfumes, fabric softeners, or soaps recently. She has previously been prescribed Valtrex  for cold sores, which she took as a thousand milligrams 2 tablets twice daily.  Her past medical history includes receiving the Zostavax vaccine. Her mother had shingles with persistent sensitivity in the affected area.  No new medications, exposures to perfumes, fabric softeners, or soaps. Itching and pain in the right upper back, sometimes radiating to the arm.     Immunization History  Administered Date(s) Administered   INFLUENZA, HIGH DOSE SEASONAL PF 09/29/2016, 09/05/2017, 08/19/2018   Influenza,inj,Quad PF,6+ Mos 08/24/2015   Influenza,inj,quad, With Preservative 08/27/2017   Moderna Sars-Covid-2 Vaccination 02/14/2020, 03/07/2020   Pneumococcal Conjugate-13 03/12/2015   Pneumococcal Polysaccharide-23  01/09/2017   Td 04/24/2016   Zoster, Live 03/12/2015      Past Medical History-  Patient Active Problem List   Diagnosis Date Noted   Depression 12/19/2016    Priority: High   S/p nephrectomy 03/12/2015    Priority: High   Anxiety state 12/09/2009    Priority: High   MICROALBUMINURIA 01/23/2008    Priority: High   Fatty liver 06/12/2022    Priority: Medium    CKD (chronic kidney disease), stage III (HCC) 02/14/2019    Priority: Medium    Prediabetes 10/22/2017    Priority: Medium    Hyperglycemia 09/18/2017    Priority: Medium    Essential hypertension 01/04/2016    Priority: Medium    Gout 03/12/2015    Priority: Medium    Hyperlipidemia 03/12/2015    Priority: Medium    COPD (chronic obstructive pulmonary disease) (HCC) 03/12/2015    Priority: Medium    Arthritis     Priority: Medium    Former smoker     Priority: Medium    Vitamin D  deficiency 05/28/2009    Priority: Medium    Hematuria, unspecified 05/26/2009    Priority: Medium    Hearing loss 01/20/2009    Priority: Medium    Osteoarthritis of spine with radiculopathy, cervical region 02/12/2018    Priority: Low   Intertrigo 01/10/2018    Priority: Low   History of osteitis pubis 12/19/2017    Priority: Low   Eczema 06/29/2015    Priority: Low   Tinnitus 03/12/2015    Priority: Low   History of colonic polyps 03/12/2015    Priority: Low  Bimalleolar fracture 07/02/2014    Priority: Low   Arthropathy 05/26/2009    Priority: Low   Insomnia, unspecified 04/27/2009    Priority: Low   DDD (degenerative disc disease), lumbosacral 01/20/2009    Priority: Low   Sialoadenitis 01/20/2009    Priority: Low   GERD 01/23/2008    Priority: Low   Microscopic hematuria 01/23/2008    Priority: Low   UTI (urinary tract infection) 01/08/2008    Priority: Low   Obesity, morbid (HCC) 11/21/2023   Upper airway cough syndrome 08/09/2023   Snoring 01/06/2020   Atypical chest pain 03/26/2017   Contact  dermatitis and other eczema, due to unspecified cause 05/26/2009    Medications- reviewed and updated Current Outpatient Medications  Medication Sig Dispense Refill   albuterol  (VENTOLIN  HFA) 108 (90 Base) MCG/ACT inhaler Inhale 2 puffs into the lungs every 6 (six) hours as needed for wheezing or shortness of breath. 18 g 2   allopurinol  (ZYLOPRIM ) 300 MG tablet TAKE 1 TABLET BY MOUTH TWICE A DAY 180 tablet 2   benzonatate  (TESSALON ) 100 MG capsule TAKE 1 CAPSULE BY MOUTH THREE TIMES A DAY AS NEEDED 60 capsule 0   betamethasone  dipropionate 0.05 % cream Apply topically 2 (two) times daily. Apply to left ear for 7 days when itches. 30 g 0   budesonide -formoterol  (SYMBICORT ) 160-4.5 MCG/ACT inhaler Inhale 2 puffs into the lungs 2 (two) times daily. 1 each 1   CALCIUM -MAGNESIUM-ZINC  PO Take by mouth.     clonazePAM  (KLONOPIN ) 1 MG tablet TAKE 1/2 TO 1 TABLET BY MOUTH AT BEDTIME AS NEEDED *DO NOT DRIVE FOR AT LEAST 8 HOURS AFTER TAKING* 90 tablet 1   Clotrimazole  POWD 1 application  by Does not apply route 2 (two) times daily. Apply after applying Clotrimazole  cream. 25 g 1   clotrimazole -betamethasone  (LOTRISONE ) cream APPLY 1 APPLICATION TOPICALLY 2 (TWO) TIMES DAILY. DRY SKIN WELL FIRST, THEN APPLY TO RASH UNDER YOUR STOMACH AND GROIN AREAS. 15 g 1   CRANBERRY PO Take by mouth.     diazepam  (VALIUM ) 10 MG tablet Take 0.5-1 tablets (5-10 mg total) by mouth every 12 (twelve) hours as needed (muscle spasms of neck. do not drive for 8 hours after taking. do not take on same day you take clonazepam  or within 12 hours.). 20 tablet 0   diclofenac  sodium (VOLTAREN ) 1 % GEL Apply topically to affected area qid 100 g 2   Ginger, Zingiber officinalis, (GINGER ROOT) 550 MG CAPS Take by mouth.     ipratropium (ATROVENT ) 0.03 % nasal spray Place 2 sprays into both nostrils every 12 (twelve) hours. 30 mL 12   omeprazole  (PRILOSEC ) 20 MG capsule TAKE 1 CAPSULE (20 MG TOTAL) BY MOUTH 2 (TWO) TIMES DAILY BEFORE A  MEAL. 180 capsule 3   ondansetron  (ZOFRAN -ODT) 4 MG disintegrating tablet TAKE 1 TABLET BY MOUTH EVERY 8 HOURS AS NEEDED FOR NAUSEA AND VOMITING 180 tablet 3   OVER THE COUNTER MEDICATION Cherry extract bid     OVER THE COUNTER MEDICATION B-12 one daily     rosuvastatin  (CRESTOR ) 10 MG tablet TAKE 1 TABLET BY MOUTH EVERY DAY 90 tablet 1   sertraline  (ZOLOFT ) 50 MG tablet TAKE 1 TABLET BY MOUTH EVERY DAY 90 tablet 1   Sodium Bicarbonate POWD by Does not apply route.     traMADol  (ULTRAM ) 50 MG tablet Take 1 tablet (50 mg total) by mouth every 6 (six) hours as needed for moderate pain (pain score 4-6) or severe pain (  pain score 7-10) (Do not drive for 6 hours after taking). 60 tablet 0   triamcinolone  (NASACORT ) 55 MCG/ACT AERO nasal inhaler Place 1 spray into the nose daily. Start with 1 spray each side twice a day for 3 days, then reduce to daily. 1 each 2   triamcinolone  cream (KENALOG ) 0.1 % Apply 1 application topically 2 (two) times daily. For 7-10 days for recurring eczema issues 80 g 1   TURMERIC PO Take by mouth.     valACYclovir  (VALTREX ) 1000 MG tablet Take 2 pills twice a day for 1 day at first sign of cold sore 28 tablet 1   valACYclovir  (VALTREX ) 1000 MG tablet Take 1 tablet (1,000 mg total) by mouth 3 (three) times daily. For 7 days for shingles. 21 tablet 0   valsartan  (DIOVAN ) 80 MG tablet Take 1 tablet (80 mg total) by mouth daily. 30 tablet 11   No current facility-administered medications for this visit.     Objective:  BP 128/78 (BP Location: Left Arm, Patient Position: Sitting, Cuff Size: Normal)   Pulse 73   Temp 97.7 F (36.5 C) (Temporal)   Ht 5' 7 (1.702 m)   Wt 281 lb 3.2 oz (127.6 kg)   SpO2 95%   BMI 44.04 kg/m  Gen: NAD, resting comfortably CV: RRR no murmurs rubs or gallops Lungs: CTAB no crackles, wheeze, rhonchi Ext: no edema Skin: warm, dry around T2-T3 distribution there is an almost elongated oval area with approximately 4 small erythematous pearly  papules potentially developing vesicles    Assessment and Plan   # Herpes zoster (shingles), right upper back, likely T2 or T3 dermatome Acute onset of itching and pain in the right upper back, consistent with early herpes zoster. Tender area with early vesicles suggests shingles in the T2-T3 dermatome. She received the Zostavax vaccine, reducing the risk of shingles and associated pain. - Prescribe Valtrex  1000 mg, to be taken three times a day for seven days (total of 21 pills).  We actually after the fact decided to renally dose just twice a day for 7 days and discussed with 1 g twice daily in the future for cold sores/fever blisters as well - Educate on the importance of early treatment to prevent postherpetic neuralgia. - Discuss the low risk of Valtrex  and potential mild side effects. - Advise that the shingles location on the back should not cause secondary issues like blindness or deafness. - Explain the potential for postherpetic neuralgia and the importance of early treatment to mitigate this risk.     # CKD stage III-most recent GFR is in the 30s or 40s-we opted as above to renally adjust Valtrex .  Continue valsartan  for microalbuminuria Lab Results  Component Value Date   LABURIC 3.7 04/23/2023   #Unilateral kidney/microalbuminuria-sees Dr. Macel S: medication: valsartan  80 mg -primarily on for renal protective effects with history of microalbuminuria A/P: She is going to keep close follow-up with nephrology with significantly elevated UACR with plan for 6-month follow-up.  Continue valsartan   #Gout S: 0 flares on allopurinol  300 mg twice daily A/P: I started thinking after visit about reducing this dose to 300 mg-I sent her a MyChart message about potentially lowering this.  Her uric acid has been well-controlled and allopurinol  does have a risk of nausea so perhaps with the reduced renal function she would benefit from going down to 300 mg daily but adjustments can cause  flares to want to move slowly  Recommended follow up: Return for as needed  for new, worsening, persistent symptoms. Future Appointments  Date Time Provider Department Center  09/05/2024 11:00 AM Zaunegger, Prentice RAMAN, PT [facility] [address]  09/08/2024  2:30 PM Dow Maxwell, PT [facility]  11/24/2024  1:00 PM Katrinka Garnette KIDD, MD [facility]  08/04/2025  3:00 PM [facility] ANNUAL WELLNESS VISIT 1 [facility] [place]    Lab/Order associations:   ICD-10-CM   1. Herpes zoster without complication  B02.9     2. Proteinuria, unspecified type  R80.9     3. Chronic gout without tophus, unspecified cause, unspecified site  [id number]     4. Stage 3 chronic kidney disease, unspecified whether stage 3a or 3b CKD (HCC)  N18.30       Meds ordered this encounter  Medications   valACYclovir  (VALTREX ) 1000 MG tablet    Sig: Take 1 tablet (1,000 mg total) by mouth 3 (three) times daily. For 7 days for shingles.    Dispense:  21 tablet    Refill:  0    Return precautions advised.  Garnette Katrinka, MD

## 2024-09-01 NOTE — Telephone Encounter (Signed)
 Do you feel that she needs to come in?

## 2024-09-05 ENCOUNTER — Encounter (HOSPITAL_BASED_OUTPATIENT_CLINIC_OR_DEPARTMENT_OTHER): Admitting: Physical Therapy

## 2024-09-08 ENCOUNTER — Ambulatory Visit: Admitting: Physical Therapy

## 2024-09-08 ENCOUNTER — Encounter: Payer: Self-pay | Admitting: Physical Therapy

## 2024-09-08 DIAGNOSIS — M5459 Other low back pain: Secondary | ICD-10-CM

## 2024-09-08 DIAGNOSIS — M79604 Pain in right leg: Secondary | ICD-10-CM

## 2024-09-08 DIAGNOSIS — M6281 Muscle weakness (generalized): Secondary | ICD-10-CM

## 2024-09-08 NOTE — Therapy (Signed)
 OUTPATIENT PHYSICAL THERAPY THORACOLUMBAR TREATMENT    Patient Name: Angie Velasquez MRN: [id number] DOB:08/23/1946, 78 y.o., female Today's Date: 09/08/2024  END OF SESSION:  PT End of Session - 09/08/24 1424     Visit Number 4    Number of Visits 8    Date for Recertification  11/03/24    Authorization Type HTA    Progress Note Due on Visit 10    PT Start Time 1428    PT Stop Time 1510    PT Time Calculation (min) 42 min    Activity Tolerance Patient tolerated treatment well    Behavior During Therapy WFL for tasks assessed/performed            Past Medical History:  Diagnosis Date   Allergy    Anxiety    years ago - no longer an issue   Arthritis    gout   Cataracts, bilateral    Complication of anesthesia    Depression 1991   Fracture of pelvis (HCC)    GERD (gastroesophageal reflux disease)    Hx of chest pain    anxiety   Hyperlipidemia    INSOMNIA 10/14/2008   in past   Joint pain    Kidney problem    Low blood sugar    eats several smaller meals a day   Obesity    Palpitations    Stress related    Proteinuria    H/O   Sleep apnea 2021   Sleep with a Cpap   Tobacco abuse    quit smoking 08/2013   Past Surgical History:  Procedure Laterality Date   ABDOMINAL HYSTERECTOMY     Had advanced endometriosis   APPENDECTOMY  Removed during hysterectomy   with hysterectomy   childbirth     x4   CHOLECYSTECTOMY  2001   EYE SURGERY Bilateral 2015   cataract surgery with lens implant   FRACTURE SURGERY  2015   Broke rt ankle in 2 places 2 plates and 4 screws in place   NEPHRECTOMY  1992   complex cystic mass - nephrectomy right   ORIF ANKLE FRACTURE Right 07/02/2014   Procedure: OPEN REDUCTION INTERNAL FIXATION (ORIF) RIGHT ANKLE BIMALLOELAR FRACTURE;  Surgeon: Norleen Armor, MD;  Location: [facility] OR;  Service: Orthopedics;  Laterality: Right;   OTHER SURGICAL HISTORY     hysterectomy, R ovary remains   SHOULDER SURGERY     bone spurs  left   TUBAL LIGATION     Patient Active Problem List   Diagnosis Date Noted   Obesity, morbid (HCC) 11/21/2023   Upper airway cough syndrome 08/09/2023   Fatty liver 06/12/2022   Snoring 01/06/2020   CKD (chronic kidney disease), stage III (HCC) 02/14/2019   Osteoarthritis of spine with radiculopathy, cervical region 02/12/2018   Intertrigo 01/10/2018   History of osteitis pubis 12/19/2017   Prediabetes 10/22/2017   Hyperglycemia 09/18/2017   Atypical chest pain 03/26/2017   Depression 12/19/2016   Essential hypertension 01/04/2016   Eczema 06/29/2015   Gout 03/12/2015   S/p nephrectomy 03/12/2015   Hyperlipidemia 03/12/2015   Tinnitus 03/12/2015   COPD (chronic obstructive pulmonary disease) (HCC) 03/12/2015   History of colonic polyps 03/12/2015   Bimalleolar fracture 07/02/2014   Arthritis    Former smoker    Anxiety state 12/09/2009   Vitamin D  deficiency 05/28/2009   Hematuria, unspecified 05/26/2009   Contact dermatitis and other eczema, due to unspecified cause 05/26/2009   Arthropathy 05/26/2009   Insomnia, unspecified  04/27/2009   DDD (degenerative disc disease), lumbosacral 01/20/2009   Hearing loss 01/20/2009   Sialoadenitis 01/20/2009   GERD 01/23/2008   MICROALBUMINURIA 01/23/2008   Microscopic hematuria 01/23/2008   UTI (urinary tract infection) 01/08/2008    PCP: Katrinka Garnette KIDD, MD   REFERRING PROVIDER: Katrinka Garnette KIDD, MD   REFERRING DIAG: [id number] (ICD-10-CM) - Chronic bilateral low back pain, unspecified whether sciatica present   Rationale for Evaluation and Treatment: Rehabilitation  THERAPY DIAG:  Pain in right leg  Muscle weakness (generalized)  Other low back pain  ONSET DATE: approx 1 year ago  SUBJECTIVE:                                                                                                                                                                                           SUBJECTIVE STATEMENT: R sided  back pain, has been doing to HEP, no radiating pain, but does have R ankle pain.   States new Shingles on Back of R shoulder, diagnoses about 1 week ago, started treatment early, and states has cleared up. Pt was being seen at different PT clinic, here today for possible/ pt requested dry needling, for  Ongoing pain in bil torso area. States pain feels like knives. States pain in bilateral thoracic region, wrapping around lateral ribs.  Has not seen ortho for this. States pain when she turnswrong or twists.  Does say that she has had some recent coughing episodes that have been significant.  She also reports newer L  chest  pain, in pec area-  pain there all day- does come and go. Just on the Left side. She feels like it is muscular, but states no pattern to it. Has seen PCP for this, and is awaiting cardiology appt in a few weeks. She has had some initial testing that was negative.   Other: R shoulder starting to hurt.  Low back:  hurts when standing for a longer time.      EVAL: Pt states her pain began approx 1 year ago with no specific MOI. Pt was seen in PT in 2023 for osteitis pubis and received dry needling.  Pt responded well to PT and stopped using her walker after PT except for longer distance.  Pt sometimes performs her HEP. Pt has been receiving personal training 2x/wk at [facility] for approx 3 weeks.  Her back starts hurting with standing activities, household chores, and walking and she has to sit down.  Pt has fatigue and pain with performing stairs.  She avoids stairs.  Pt states she has pain that travels to R hip and also in R LE.  Pt does seated Hip ER stretch which helps. Pt lies down in the middle of the day and rests which helps.    PERTINENT HISTORY:  Trace anterolisthesis of L4 on L5 per CT Pt states she has a R sided hernia. OA, gout ORIF for R ankle bimalleolar Fx 2015  CKD, stage 3 s/p Nephrectomy in 1992 Obesity, depression, L shoulder surgery 5 mm nodule  on 05/14/2023 and right upper lobe   PAIN:  NPRS:  2/10 current, >10/10 worst   Location:  central lumbar and bilat lumbar flanks.  It can travel to R LE sometimes but not recently Aching pain- standing too long makes it worse, getting off feet makes it better   PRECAUTIONS:  Trace anterolisthesis of L4 on L5.   WEIGHT BEARING RESTRICTIONS: No  FALLS:  Has patient fallen in last 6 months? No  LIVING ENVIRONMENT: Lives with: lives alone Lives in: 1 story home Stairs: 2 steps to enter home with rails Has following equipment at home:   OCCUPATION: Pt is retired  PLOF: Independent  Pt has someone who comes once per month to help clean home.  PATIENT GOALS: to eliminate pain, to find exercises to help   OBJECTIVE:  Note: Objective measures were completed at Evaluation unless otherwise noted.  DIAGNOSTIC FINDINGS:  CT abdomen/pelvis 12/24: Musculoskeletal: Curvature of the spine. Scattered degenerative changes seen along the spine and pelvis. Trace anterolisthesis of L4 on L5. Multilevel stenosis along the lower lumbar spine.  PATIENT SURVEYS:  Modified Oswestry:  MODIFIED OSWESTRY DISABILITY SCALE  Date: 07/10/2024 Score  Pain intensity 1 = The pain is bad, but I can manage without having to take (1) I can stand as long as I want but, it increases my pain. pain medication.  2. Personal care (washing, dressing, etc.) 0 =  I can take care of myself normally without causing increased pain.  3. Lifting 2 = Pain prevents me from lifting heavy weights off the floor, activities (eg. sports, dancing). but I can manage if the weights are conveniently positioned (3) Pain prevents me from going out very often. (eg, on a table).  4. Walking 1 = Pain prevents me from walking more than 1 mile.  5. Sitting 1 =  I can only sit in my favorite chair as long as I like.  6. Standing 4 =  Pain prevents me from standing more than 10 minutes.  7. Sleeping 0 = Pain does not prevent me from sleeping  well.  8. Social Life 0 = My social life is normal and does not increase my pain.  9. Traveling 1 =  I can travel anywhere, but it increases my pain.  10. Employment/ Homemaking 0 = My normal homemaking/job activities do not cause pain.  Total 10/50  = 20%   Interpretation of scores: Score Category Description  0-20% Minimal Disability The patient can cope with most living activities. Usually no treatment is indicated apart from advice on lifting, sitting and exercise  21-40% Moderate Disability The patient experiences more pain and difficulty with sitting, lifting and standing. Travel and social life are more difficult and they may be disabled from work. Personal care, sexual activity and sleeping are not grossly affected, and the patient can usually be managed by conservative means  41-60% Severe Disability Pain remains the main problem in this group, but activities of daily living are affected. These patients require a detailed investigation  61-80% Crippled Back pain impinges on all aspects of the patient's life. Positive  intervention is required  81-100% Bed-bound  These patients are either bed-bound or exaggerating their symptoms  Bluford FORBES Zoe DELENA Karon DELENA, et al. Surgery versus conservative management of stable thoracolumbar fracture: the PRESTO feasibility RCT. [place] ([place]): [employer]; 2021 Nov. [facility] Technology Assessment, No. 25.62.) Appendix 3, Oswestry Disability Index category descriptors. Available from: FindJewelers.cz  Minimally Clinically Important Difference (MCID) = 12.8%  COGNITION: Overall cognitive status: Within functional limits for tasks assessed       MUSCLE LENGTH: Hamstrings: tightness in bilat HS, L > R  PALPATION: TTP in bilat lateral lumbar flanks Significant tenderness to light touch to L pec and breast  Moderate tenderness in bil lateral ribs with light/medium palpation.    LUMBAR ROM:   AROM eval   Flexion WFL  Extension   Right lateral flexion WFL  Left lateral flexion WFL  Right rotation WFL  Left rotation WFL   (Blank rows = not tested)  LOWER EXTREMITY MMT:    MMT Right eval Left eval  Hip flexion 5/5 4/5  Hip extension    Hip abduction < 3/5 3+/5  Hip adduction    Hip internal rotation    Hip external rotation 5/5 4/5  Knee flexion 5/5 Seated 5/5 seated  Knee extension 5/5 5/5  Ankle dorsiflexion 5/5 5/5  Ankle plantarflexion WFL seated WFL seated  Ankle inversion    Ankle eversion     (Blank rows = not tested)  LUMBAR SPECIAL TESTS:  SLR test:  negative bilat   Other tests:  Pain /cramping in R torso reproduced with trial for supine SA reach, and shoulder flexion overhead.    GAIT: Assistive device utilized: None Level of assistance: Complete Independence Comments: limited reciprocal arm swing and pelvic rotation  TREATMENT:                                                                                                                                09/08/24: Ther ex:  Seated: thoracic rotation x12 ,  QL stretch  x 3 bil;,  Supine SA reaches (cramping in ribs) x 10   , Supine shoulder flexion/canemx 10  , pelvic tilts,   Standing Scap squeeze x 15  Self care: review of current symptoms,  recent coughing spells, shingles, rib pain, movement recommendations , dry needling, and PT expectations.      08/15/24  QL stretch seated  Lumbar flexion stretch Lat stretch seated Sit to stands 2x10 from plinht Seated marching with TrA 2x10 PPT 2x10 x3 seconds Figure 4 stretch 2x30 seconds B Piriformis stretch hooklying Sidelying clams 2x10ea Standing PNF chop with ball x10 ea(started feeling abdominal spasm) Standing oblique stretch (wide legs) 10sec x2ea Prone on elbows Prone press ups x10 (for abdominal stretch)  07/25/24  Bridges x12 PPT 15x3 seconds PPT + march x5 Figure 4 stretch 2x30 seconds B Sidelying clams red TB x12 B Bridge + ABD  into red TB x12 Seated QL  stretch with stool 3x30 seconds forward only  Seated scapular retraction 15x3 seconds  Backward shoulder rolls x20    07/10/24 Eval     PATIENT EDUCATION:  Education details: dx, POC, rationale of interventions, relevant anatomy, prognosis, and objective findings. PT answered pt's questions.  Person educated: Patient Education method: Explanation Education comprehension: verbalized understanding and needs further education  HOME EXERCISE PROGRAM:  Access Code: [email or web address] URL: [email or web address] Date: 07/25/2024 Prepared by: Josette Rough  Exercises - Supine Bridge  - 1 x daily - 7 x weekly - 2 sets - 10 reps - 2 seconds  hold - Supine Posterior Pelvic Tilt  - 1 x daily - 7 x weekly - 2 sets - 10 reps - 3 seconds  hold - Supine March with Posterior Pelvic Tilt  - 1 x daily - 7 x weekly - 2 sets - 5 reps - Supine Figure 4 Piriformis Stretch  - 1 x daily - 7 x weekly - 2 sets - 4 reps - 30 seconds hold     ASSESSMENT:  CLINICAL IMPRESSION: Pt assessed today for possible dry needling - requested by pt for torso pain. She demonstrates increased pain in thoracic region, radiating into lateral torso and ribs, with activity and rotation. She will benefit from thoracic mobility, shoulder mobility, core strength and education on body mechanics for IADLs. Possible increased pain from recent coughing episodes, and seems to be stemming from t-spine and thoracic musculature. Dry needling not appropriate for pain she is experiencing. Will continue to monitor pain in L pec area. She does have superficial pain to palpate pec and breast on L. Reviewed pts current symptoms and movement recommendations at this time. Pt to benefit from continued care for PT.    EVAL: Patient is a 78 y.o. female with a dx of chronic bilat LBP.  Pt has pain in bilat sides of lumbar though states it can travel to R hip and R LE.  She has increased pain with with standing  activities, household chores, and ambulation.  Pt avoids stairs due to fatigue and pain.  Pt has good lumbar AROM.  Pt has weakness in bilat hip abd, L hip flex, and L hip ER.  Pt has started seeing a personal trainer 2x/wk.  She should benefit from skilled PT to address impairments and improve overall function.      OBJECTIVE IMPAIRMENTS: decreased activity tolerance, decreased endurance, decreased mobility, difficulty walking, decreased strength, increased fascial restrictions, impaired flexibility, and pain.   ACTIVITY LIMITATIONS: standing, stairs, and locomotion level  PARTICIPATION LIMITATIONS: cleaning, shopping, and community activity  PERSONAL FACTORS: 3+ comorbidities: OA, obesity, R ankle ORIF, R sided hernia, CKD with nephrectomy are also affecting patient's functional outcome.   REHAB POTENTIAL: Good  CLINICAL DECISION MAKING: Stable/uncomplicated  EVALUATION COMPLEXITY: Low   GOALS:   SHORT TERM GOALS: Target date:  07/31/24   Pt will be independent and compliant with HEP for improved pain, strength, and function.  Baseline: Goal status: MET  2.  Pt will report improved tolerance with standing in order to perform increased household chores.  Baseline:  Goal status: In progress   3.  Pt will report at least a 25% improvement in pain and sx's overall.  Baseline:  Goal status: In progress    LONG TERM GOALS: Target date: 11/03/2024   Pt will demo improved LE strength to at least 4/5 in R hip abd and 4+/5 in L hip abd and 5/5 in L hip flex and ER for improved  performance of and tolerance with functional mobility and household chores.  Baseline:  Goal status: In progress   2.  Pt will report she is able to perform her normal household chores without significant pain and difficulty.   Baseline:  Goal status: In progress   3.  Pt will be able to ambulate extended community distance without significant pain and difficulty.  Baseline:  Goal status: In  progress  4. Pt to report decreased pain in torso/thoracic/rib region to 0-2/10 with standing activity, IADLs and trunk rotation.    Goal status: In progress  5. Pt to demo ability and optimal mechanics for bend, lift, squat, to improve ability for IADLs without pain.    Goal status: In progress.     PLAN:  PT FREQUENCY: 1-2x/week  PT DURATION: 6 weeks  PLANNED INTERVENTIONS: 97164- PT Re-evaluation, 97750- Physical Performance Testing, 97110-Therapeutic exercises, 97530- Therapeutic activity, [id number]- Neuromuscular re-education, 97535- Self Care, [id number]- Manual therapy, [phone]- Gait training, [phone]- Aquatic Therapy, Patient/Family education, Stair training, Taping, Joint mobilization, Spinal mobilization, Cryotherapy, and Moist heat.  PLAN FOR NEXT SESSION:    Core and LE strengthening PRE, incorporate postural training/strengthening.  Review her gym program. Thoracic/scapular mobility, body mechanics for bend, lift, twist,  monitor L chest/pec pain.     Tinnie Don, PT, DPT 2:25 PM  09/08/24

## 2024-09-11 ENCOUNTER — Encounter: Payer: Self-pay | Admitting: Family Medicine

## 2024-09-18 DIAGNOSIS — M75101 Unspecified rotator cuff tear or rupture of right shoulder, not specified as traumatic: Secondary | ICD-10-CM | POA: Diagnosis not present

## 2024-09-18 DIAGNOSIS — M79621 Pain in right upper arm: Secondary | ICD-10-CM | POA: Diagnosis not present

## 2024-09-21 ENCOUNTER — Other Ambulatory Visit: Payer: Self-pay | Admitting: Family Medicine

## 2024-10-15 ENCOUNTER — Encounter: Payer: Self-pay | Admitting: Family Medicine

## 2024-10-15 DIAGNOSIS — N644 Mastodynia: Secondary | ICD-10-CM

## 2024-10-15 DIAGNOSIS — Z1231 Encounter for screening mammogram for malignant neoplasm of breast: Secondary | ICD-10-CM

## 2024-10-15 NOTE — Telephone Encounter (Signed)
 Okay to send order.

## 2024-10-16 ENCOUNTER — Encounter: Payer: Self-pay | Admitting: Cardiology

## 2024-10-16 NOTE — Progress Notes (Unsigned)
 Cardiology Office Note   Date:  10/17/2024   ID:  Deneene, Tarver 1946-04-22, MRN [id number]  PCP:  Katrinka Garnette KIDD, MD  Cardiologist:   None Referring:  Katrinka Garnette KIDD, MD  Chief Complaint  Patient presents with   Chest Pain      History of Present Illness: Angie Velasquez is a 78 y.o. female who presents for evaluation of chest pain.  She has no past cardiac history.  She used exercise and [employer] well but she has not been doing that for a while.  She reports that she has had violent coughing for a couple of years it was ultimately thought to be related to ACE inhibitors.  She said she thinks that is the reason she is having current chest discomfort.  This is a discomfort that is under the left arm and left breast.  It does radiate to the sternal area and sometimes it is deep.  There is reproducible tenderness to palpation on the chest and breast.  When she gets this it can be 8 out of 10 in intensity.  Is not positional.  She might have a little arm discomfort.  Comes and goes.  There is no associated nausea vomiting or diaphoresis but she has been chronically nauseated for 2 years.  This is not reproducible with activity.  She vacuums and she gets fatigued easily but it does not bring on the chest discomfort.  I do note that she is been in the emergency room before because of violent coughing.  They saw her in the past they did see a little coronary calcium  and they did a CT but ruled out pulmonary embolism.  She had a little aortic atherosclerosis noted.  Troponins however at that time were negative.  EKG was nonacute as it is today.  She has not had any prior cardiac workup or history.  Past Medical History:  Diagnosis Date   Anxiety    years ago - no longer an issue   Arthritis    gout   Cataracts, bilateral    Complication of anesthesia    Depression 1991   GERD (gastroesophageal reflux disease)    Hyperlipidemia    INSOMNIA 10/14/2008   in  past   Obesity    Proteinuria    H/O   Sleep apnea 2021   Sleep with a Cpap   Tobacco abuse    quit smoking 08/2013    Past Surgical History:  Procedure Laterality Date   ABDOMINAL HYSTERECTOMY     Had advanced endometriosis   APPENDECTOMY  Removed during hysterectomy   with hysterectomy   childbirth     x4   CHOLECYSTECTOMY  2001   EYE SURGERY Bilateral 2015   cataract surgery with lens implant   FRACTURE SURGERY  2015   Broke rt ankle in 2 places 2 plates and 4 screws in place   NEPHRECTOMY  1992   complex cystic mass - nephrectomy right   ORIF ANKLE FRACTURE Right 07/02/2014   Procedure: OPEN REDUCTION INTERNAL FIXATION (ORIF) RIGHT ANKLE BIMALLOELAR FRACTURE;  Surgeon: Norleen Armor, MD;  Location: [facility] OR;  Service: Orthopedics;  Laterality: Right;   OTHER SURGICAL HISTORY     hysterectomy, R ovary remains   SHOULDER SURGERY     bone spurs left   TUBAL LIGATION       Current Outpatient Medications  Medication Sig Dispense Refill   albuterol  (VENTOLIN  HFA) 108 (90 Base) MCG/ACT inhaler Inhale 2 puffs into  the lungs every 6 (six) hours as needed for wheezing or shortness of breath. 18 g 2   allopurinol  (ZYLOPRIM ) 300 MG tablet TAKE 1 TABLET BY MOUTH TWICE A DAY 180 tablet 2   benzonatate  (TESSALON ) 100 MG capsule TAKE 1 CAPSULE BY MOUTH THREE TIMES A DAY AS NEEDED 60 capsule 0   betamethasone  dipropionate 0.05 % cream Apply topically 2 (two) times daily. Apply to left ear for 7 days when itches. 30 g 0   budesonide -formoterol  (SYMBICORT ) 160-4.5 MCG/ACT inhaler Inhale 2 puffs into the lungs 2 (two) times daily. 1 each 1   CALCIUM -MAGNESIUM-ZINC  PO Take by mouth.     clonazePAM  (KLONOPIN ) 1 MG tablet TAKE 1/2 TO 1 TABLET BY MOUTH AT BEDTIME AS NEEDED *DO NOT DRIVE FOR AT LEAST 8 HOURS AFTER TAKING* 90 tablet 1   Clotrimazole  POWD 1 application  by Does not apply route 2 (two) times daily. Apply after applying Clotrimazole  cream. 25 g 1   clotrimazole -betamethasone   (LOTRISONE ) cream APPLY 1 APPLICATION TOPICALLY 2 (TWO) TIMES DAILY. DRY SKIN WELL FIRST, THEN APPLY TO RASH UNDER YOUR STOMACH AND GROIN AREAS. 15 g 1   CRANBERRY PO Take by mouth.     diazepam  (VALIUM ) 10 MG tablet Take 0.5-1 tablets (5-10 mg total) by mouth every 12 (twelve) hours as needed (muscle spasms of neck. do not drive for 8 hours after taking. do not take on same day you take clonazepam  or within 12 hours.). 20 tablet 0   diclofenac  sodium (VOLTAREN ) 1 % GEL Apply topically to affected area qid 100 g 2   Ginger, Zingiber officinalis, (GINGER ROOT) 550 MG CAPS Take by mouth.     ipratropium (ATROVENT ) 0.03 % nasal spray Place 2 sprays into both nostrils every 12 (twelve) hours. 30 mL 12   omeprazole  (PRILOSEC ) 20 MG capsule TAKE 1 CAPSULE (20 MG TOTAL) BY MOUTH 2 (TWO) TIMES DAILY BEFORE A MEAL. 180 capsule 3   ondansetron  (ZOFRAN -ODT) 4 MG disintegrating tablet TAKE 1 TABLET BY MOUTH EVERY 8 HOURS AS NEEDED FOR NAUSEA AND VOMITING 180 tablet 3   OVER THE COUNTER MEDICATION Cherry extract bid     OVER THE COUNTER MEDICATION B-12 one daily     rosuvastatin  (CRESTOR ) 10 MG tablet TAKE 1 TABLET BY MOUTH EVERY DAY 90 tablet 1   sertraline  (ZOLOFT ) 50 MG tablet TAKE 1 TABLET BY MOUTH EVERY DAY 90 tablet 1   Sodium Bicarbonate POWD by Does not apply route.     traMADol  (ULTRAM ) 50 MG tablet Take 1 tablet (50 mg total) by mouth every 6 (six) hours as needed for moderate pain (pain score 4-6) or severe pain (pain score 7-10) (Do not drive for 6 hours after taking). 60 tablet 0   triamcinolone  (NASACORT ) 55 MCG/ACT AERO nasal inhaler Place 1 spray into the nose daily. Start with 1 spray each side twice a day for 3 days, then reduce to daily. 1 each 2   triamcinolone  cream (KENALOG ) 0.1 % Apply 1 application topically 2 (two) times daily. For 7-10 days for recurring eczema issues 80 g 1   TURMERIC PO Take by mouth.     valACYclovir  (VALTREX ) 1000 MG tablet Take 2 pills twice a day for 1 day at  first sign of cold sore 28 tablet 1   valACYclovir  (VALTREX ) 1000 MG tablet Take 1 tablet (1,000 mg total) by mouth 3 (three) times daily. For 7 days for shingles. 21 tablet 0   valsartan  (DIOVAN ) 80 MG tablet Take 1  tablet (80 mg total) by mouth daily. 30 tablet 11   No current facility-administered medications for this visit.    Allergies:   Epinephrine , Codeine, Iodinated contrast media, Ozempic  (0.25 or 0.5 mg-dose) [semaglutide (0.25 or 0.5mg -dos)], Penicillins, and Prednisone     Social History:  The patient  reports that she quit smoking about 11 years ago. Her smoking use included cigarettes. She started smoking about 46 years ago. She has a 35 pack-year smoking history. She has never used smokeless tobacco. She reports that she does not currently use alcohol after a past usage of about 1.0 standard drink of alcohol per week. She reports that she does not use drugs.   Family History:  The patient's family history includes Alzheimer's disease in her father; Arthritis in her mother; Cancer in her mother; Colon cancer in her paternal grandmother; Hypertension in her father; Intellectual disability in her father; Liver disease in her daughter; Obesity in her father and mother; Ovarian cancer in her mother; Stroke in her father.    ROS:  Please see the history of present illness.   Otherwise, review of systems are positive for none.   All other systems are reviewed and negative.    PHYSICAL EXAM: VS:  BP 134/80   Pulse 80   Ht 5' 7 (1.702 m)   Wt 282 lb (127.9 kg)   BMI 44.17 kg/m  , BMI Body mass index is 44.17 kg/m. GENERAL:  Well appearing HEENT:  Pupils equal round and reactive, fundi not visualized, oral mucosa unremarkable NECK:  No jugular venous distention, waveform within normal limits, carotid upstroke brisk and symmetric, no bruits, no thyromegaly LYMPHATICS:  No cervical, inguinal adenopathy LUNGS:  Clear to auscultation bilaterally BACK:  No CVA tenderness CHEST:   Unremarkable HEART:  PMI not displaced or sustained,S1 and S2 within normal limits, no S3, no S4, no clicks, no rubs, no murmurs ABD:  Flat, positive bowel sounds normal in frequency in pitch, no bruits, no rebound, no guarding, no midline pulsatile mass, no hepatomegaly, no splenomegaly EXT:  2 plus pulses throughout, no edema, no cyanosis no clubbing SKIN:  No rashes no nodules NEURO:  Cranial nerves II through XII grossly intact, motor grossly intact throughout [facility]:  Cognitively intact, oriented to person place and time    EKG:  EKG Interpretation Date/Time:  Friday October 17 2024 16:04:04 EST Ventricular Rate:  80 PR Interval:  178 QRS Duration:  86 QT Interval:  374 QTC Calculation: 431 R Axis:   13  Text Interpretation: Normal sinus rhythm Poor anterior R wave progression When compared with ECG of 14-May-2023 11:54, No significant change since last tracing Confirmed by Lavona Agent ([id number]) on 10/17/2024 4:24:48 PM     Recent Labs: 11/16/2023: TSH 2.95 05/14/2024: ALT 31; BUN 14; Creatinine, Ser 1.18; Hemoglobin 12.9; Platelets 210.0; Potassium 4.1; Sodium 140    Lipid Panel    Component Value Date/Time   CHOL 134 11/16/2023 0844   CHOL 212 (H) 05/04/2020 1441   TRIG 221.0 (H) 11/16/2023 0844   HDL 56.20 11/16/2023 0844   HDL 63 05/04/2020 1441   CHOLHDL 2 11/16/2023 0844   VLDL 44.2 (H) 11/16/2023 0844   LDLCALC 34 11/16/2023 0844   LDLCALC 112 (H) 05/04/2020 1441   LDLDIRECT 104.0 04/23/2023 0754      Wt Readings from Last 3 Encounters:  10/17/24 282 lb (127.9 kg)  09/01/24 281 lb 3.2 oz (127.6 kg)  08/21/24 279 lb 12.8 oz (126.9 kg)      Other  studies Reviewed: Additional studies/ records that were reviewed today include: Hospital ED records. Review of the above records demonstrates:  Please see elsewhere in the note.     ASSESSMENT AND PLAN:   Chest pain: Her chest pain is reproducible with palpation.  She has a normal EKG and normal exam.   Previously some of this discomfort enzymes have been normal and the emergency room.  It is nonanginal chest pain and likely musculoskeletal.  She had I had a long conversation about this and had not suggested further testing.  Morbid obesity: We had a long conversation about this.  She said at some point in time she will have a mindset to go back to [employer].  She was there before but there is too much going on right now.  She is actually quite distressed by family members who will pour into town for Thanksgiving and I will lecture her about her weight.  Current medicines are reviewed at length with the patient today.  The patient does not have concerns regarding medicines.  The following changes have been made:  no change  Labs/ tests ordered today include:   Orders Placed This Encounter  Procedures   EKG 12-Lead     Disposition:   FU with me as needed.     Signed, Lynwood Schilling, MD  10/17/2024 5:53 PM    [facility]

## 2024-10-17 ENCOUNTER — Ambulatory Visit: Attending: Cardiology | Admitting: Cardiology

## 2024-10-17 VITALS — BP 134/80 | HR 80 | Ht 67.0 in | Wt 282.0 lb

## 2024-10-17 DIAGNOSIS — I10 Essential (primary) hypertension: Secondary | ICD-10-CM | POA: Diagnosis not present

## 2024-10-17 NOTE — Patient Instructions (Signed)

## 2024-11-05 ENCOUNTER — Encounter: Payer: Self-pay | Admitting: Family Medicine

## 2024-11-14 ENCOUNTER — Ambulatory Visit
Admission: RE | Admit: 2024-11-14 | Discharge: 2024-11-14 | Disposition: A | Source: Ambulatory Visit | Attending: Family Medicine | Admitting: Family Medicine

## 2024-11-14 DIAGNOSIS — N644 Mastodynia: Secondary | ICD-10-CM

## 2024-11-24 ENCOUNTER — Encounter: Admitting: Family Medicine

## 2024-12-01 ENCOUNTER — Other Ambulatory Visit: Payer: Self-pay | Admitting: Family Medicine

## 2024-12-01 DIAGNOSIS — J441 Chronic obstructive pulmonary disease with (acute) exacerbation: Secondary | ICD-10-CM

## 2024-12-02 ENCOUNTER — Encounter: Payer: Self-pay | Admitting: Family Medicine

## 2024-12-18 ENCOUNTER — Other Ambulatory Visit: Payer: Self-pay | Admitting: Family Medicine

## 2025-01-07 ENCOUNTER — Other Ambulatory Visit (HOSPITAL_BASED_OUTPATIENT_CLINIC_OR_DEPARTMENT_OTHER): Payer: Self-pay

## 2025-01-07 MED FILL — Omeprazole Cap Delayed Release 20 MG: ORAL | 90 days supply | Qty: 180 | Fill #0 | Status: AC

## 2025-01-07 MED FILL — Allopurinol Tab 300 MG: ORAL | 90 days supply | Qty: 180 | Fill #0 | Status: AC

## 2025-01-07 MED FILL — Sertraline HCl Tab 50 MG: ORAL | 90 days supply | Qty: 90 | Fill #0 | Status: AC

## 2025-01-08 ENCOUNTER — Other Ambulatory Visit: Payer: Self-pay

## 2025-08-04 ENCOUNTER — Ambulatory Visit
